# Patient Record
Sex: Male | Born: 1945 | Race: White | Hispanic: No | Smoking: Never smoker
Health system: Southern US, Community
[De-identification: ages and names within clinical notes are randomized; demographics above are authoritative.]

## PROBLEM LIST (undated history)

## (undated) DIAGNOSIS — I639 Cerebral infarction, unspecified: Secondary | ICD-10-CM

## (undated) DIAGNOSIS — I10 Essential (primary) hypertension: Secondary | ICD-10-CM

## (undated) DIAGNOSIS — I4819 Other persistent atrial fibrillation: Secondary | ICD-10-CM

## (undated) DIAGNOSIS — Q283 Other malformations of cerebral vessels: Secondary | ICD-10-CM

## (undated) DIAGNOSIS — M069 Rheumatoid arthritis, unspecified: Secondary | ICD-10-CM

## (undated) DIAGNOSIS — E785 Hyperlipidemia, unspecified: Secondary | ICD-10-CM

## (undated) DIAGNOSIS — M1990 Unspecified osteoarthritis, unspecified site: Secondary | ICD-10-CM

## (undated) HISTORY — PX: COLONOSCOPY: SHX174

## (undated) HISTORY — PX: TONSILLECTOMY: SUR1361

## (undated) HISTORY — DX: Essential (primary) hypertension: I10

## (undated) HISTORY — PX: PALATE / UVULA BIOPSY / EXCISION: SUR128

## (undated) HISTORY — DX: Hyperlipidemia, unspecified: E78.5

## (undated) HISTORY — PX: LIPOMA EXCISION: SHX5283

## (undated) HISTORY — DX: Other malformations of cerebral vessels: Q28.3

## (undated) HISTORY — DX: Rheumatoid arthritis, unspecified: M06.9

## (undated) HISTORY — DX: Unspecified osteoarthritis, unspecified site: M19.90

## (undated) HISTORY — DX: Other persistent atrial fibrillation: I48.19

## (undated) HISTORY — DX: Cerebral infarction, unspecified: I63.9

---

## 2003-09-12 ENCOUNTER — Ambulatory Visit (HOSPITAL_BASED_OUTPATIENT_CLINIC_OR_DEPARTMENT_OTHER): Admission: RE | Admit: 2003-09-12 | Discharge: 2003-09-12 | Payer: Self-pay | Admitting: Otolaryngology

## 2004-12-11 ENCOUNTER — Ambulatory Visit: Payer: Self-pay | Admitting: Family Medicine

## 2005-09-24 ENCOUNTER — Ambulatory Visit: Payer: Self-pay | Admitting: Family Medicine

## 2005-10-12 ENCOUNTER — Ambulatory Visit: Payer: Self-pay | Admitting: Family Medicine

## 2006-04-27 ENCOUNTER — Ambulatory Visit: Payer: Self-pay | Admitting: Family Medicine

## 2006-09-28 ENCOUNTER — Ambulatory Visit: Payer: Self-pay | Admitting: Family Medicine

## 2006-09-28 LAB — CONVERTED CEMR LAB
Albumin: 4.2 g/dL (ref 3.5–5.2)
BUN: 13 mg/dL (ref 6–23)
Basophils Absolute: 0 10*3/uL (ref 0.0–0.1)
Calcium: 9.9 mg/dL (ref 8.4–10.5)
Chol/HDL Ratio, serum: 3.6
Eosinophil percent: 5.5 % — ABNORMAL HIGH (ref 0.0–5.0)
GFR calc non Af Amer: 60 mL/min
HDL: 60.1 mg/dL (ref >39.0)
Lymphocytes Relative: 22.4 % (ref 12.0–46.0)
Monocytes Absolute: 0.6 10*3/uL (ref 0.2–0.7)
Neutro Abs: 3.5 10*3/uL (ref 1.4–7.7)
Platelets: 251 10*3/uL (ref 150–400)
Potassium: 3.4 meq/L — ABNORMAL LOW (ref 3.5–5.1)
RDW: 11.9 % (ref 11.5–14.6)
Sodium: 142 meq/L (ref 135–145)
TSH: 1.24 microintl units/mL (ref 0.35–5.50)
Triglyceride fasting, serum: 150 mg/dL — ABNORMAL HIGH (ref 0–149)

## 2006-10-19 ENCOUNTER — Ambulatory Visit: Payer: Self-pay | Admitting: Family Medicine

## 2007-07-05 ENCOUNTER — Encounter: Payer: Self-pay | Admitting: Family Medicine

## 2007-10-26 ENCOUNTER — Ambulatory Visit: Payer: Self-pay | Admitting: Family Medicine

## 2007-10-26 LAB — CONVERTED CEMR LAB
Alkaline Phosphatase: 46 units/L (ref 39–117)
BUN: 14 mg/dL (ref 6–23)
Basophils Absolute: 0.1 10*3/uL (ref 0.0–0.1)
Basophils Relative: 0.9 % (ref 0.0–1.0)
CO2: 31 meq/L (ref 19–32)
Calcium: 10.1 mg/dL (ref 8.4–10.5)
Cholesterol: 245 mg/dL (ref 0–200)
Creatinine, Ser: 1.2 mg/dL (ref 0.4–1.5)
Eosinophils Relative: 4.1 % (ref 0.0–5.0)
GFR calc Af Amer: 80 mL/min
HCT: 45 % (ref 39.0–52.0)
HDL: 66.6 mg/dL (ref >39.0)
Hemoglobin: 15.9 g/dL (ref 13.0–17.0)
MCHC: 35.3 g/dL (ref 30.0–36.0)
MCV: 92.7 fL (ref 78.0–100.0)
Monocytes Absolute: 0.6 10*3/uL (ref 0.2–0.7)
Neutro Abs: 3.3 10*3/uL (ref 1.4–7.7)
Platelets: 209 10*3/uL (ref 150–400)
Potassium: 4.1 meq/L (ref 3.5–5.1)
RBC: 4.85 M/uL (ref 4.22–5.81)
RDW: 11.7 % (ref 11.5–14.6)
Sodium: 140 meq/L (ref 135–145)
Triglycerides: 186 mg/dL — ABNORMAL HIGH (ref 0–149)
VLDL: 37 mg/dL (ref 0–40)
WBC: 5.6 10*3/uL (ref 4.5–10.5)

## 2007-11-01 ENCOUNTER — Ambulatory Visit: Payer: Self-pay | Admitting: Family Medicine

## 2007-11-01 DIAGNOSIS — I10 Essential (primary) hypertension: Secondary | ICD-10-CM | POA: Insufficient documentation

## 2007-11-01 DIAGNOSIS — E785 Hyperlipidemia, unspecified: Secondary | ICD-10-CM | POA: Insufficient documentation

## 2008-06-28 ENCOUNTER — Ambulatory Visit: Payer: Self-pay | Admitting: Internal Medicine

## 2008-07-20 ENCOUNTER — Ambulatory Visit: Payer: Self-pay | Admitting: Internal Medicine

## 2008-07-20 ENCOUNTER — Encounter: Payer: Self-pay | Admitting: Internal Medicine

## 2008-07-20 LAB — HM COLONOSCOPY

## 2008-07-24 ENCOUNTER — Encounter: Payer: Self-pay | Admitting: Internal Medicine

## 2008-10-24 ENCOUNTER — Ambulatory Visit: Payer: Self-pay | Admitting: Family Medicine

## 2008-10-24 LAB — CONVERTED CEMR LAB
AST: 52 units/L — ABNORMAL HIGH (ref 0–37)
Albumin: 4.2 g/dL (ref 3.5–5.2)
Alkaline Phosphatase: 37 units/L — ABNORMAL LOW (ref 39–117)
BUN: 21 mg/dL (ref 6–23)
Basophils Absolute: 0 10*3/uL (ref 0.0–0.1)
Basophils Relative: 0.7 % (ref 0.0–3.0)
Eosinophils Absolute: 0.2 10*3/uL (ref 0.0–0.7)
Eosinophils Relative: 4.2 % (ref 0.0–5.0)
GFR calc Af Amer: 88 mL/min
HCT: 43.5 % (ref 39.0–52.0)
Hemoglobin: 15.1 g/dL (ref 13.0–17.0)
LDL Cholesterol: 83 mg/dL (ref 0–99)
Monocytes Relative: 9.2 % (ref 3.0–12.0)
Neutro Abs: 3.3 10*3/uL (ref 1.4–7.7)
PSA: 2.73 ng/mL (ref 0.10–4.00)
Potassium: 3.4 meq/L — ABNORMAL LOW (ref 3.5–5.1)
Protein, U semiquant: NEGATIVE
RBC: 4.71 M/uL (ref 4.22–5.81)
RDW: 12 % (ref 11.5–14.6)
TSH: 0.96 microintl units/mL (ref 0.35–5.50)
Total Bilirubin: 1.4 mg/dL — ABNORMAL HIGH (ref 0.3–1.2)
Total CHOL/HDL Ratio: 2.6
Total Protein: 7.2 g/dL (ref 6.0–8.3)
Triglycerides: 198 mg/dL — ABNORMAL HIGH (ref 0–149)
WBC: 5.3 10*3/uL (ref 4.5–10.5)

## 2008-11-02 ENCOUNTER — Telehealth: Payer: Self-pay | Admitting: Family Medicine

## 2008-11-19 ENCOUNTER — Ambulatory Visit: Payer: Self-pay | Admitting: Family Medicine

## 2008-11-21 ENCOUNTER — Telehealth: Payer: Self-pay | Admitting: Family Medicine

## 2008-12-26 ENCOUNTER — Ambulatory Visit: Payer: Self-pay | Admitting: Family Medicine

## 2008-12-26 LAB — CONVERTED CEMR LAB
ALT: 56 units/L — ABNORMAL HIGH (ref 0–53)
AST: 30 units/L (ref 0–37)
Bilirubin, Direct: 0.2 mg/dL (ref 0.0–0.3)
Total Bilirubin: 1.5 mg/dL — ABNORMAL HIGH (ref 0.3–1.2)
Total Protein: 7.1 g/dL (ref 6.0–8.3)

## 2008-12-28 ENCOUNTER — Telehealth: Payer: Self-pay | Admitting: Family Medicine

## 2009-01-04 ENCOUNTER — Telehealth: Payer: Self-pay | Admitting: Family Medicine

## 2009-04-29 ENCOUNTER — Ambulatory Visit: Payer: Self-pay | Admitting: Family Medicine

## 2009-04-29 LAB — CONVERTED CEMR LAB
ALT: 120 units/L — ABNORMAL HIGH (ref 0–53)
AST: 70 units/L — ABNORMAL HIGH (ref 0–37)
Albumin: 4.2 g/dL (ref 3.5–5.2)
Alkaline Phosphatase: 40 units/L (ref 39–117)
BUN: 14 mg/dL (ref 6–23)
Basophils Relative: 0.8 % (ref 0.0–3.0)
CO2: 30 meq/L (ref 19–32)
Chloride: 108 meq/L (ref 96–112)
Creatinine, Ser: 1.1 mg/dL (ref 0.4–1.5)
Direct LDL: 120.1 mg/dL
GFR calc non Af Amer: 72.39 mL/min (ref >60)
Lymphocytes Relative: 22.1 % (ref 12.0–46.0)
Lymphs Abs: 1.2 10*3/uL (ref 0.7–4.0)
Neutro Abs: 3.4 10*3/uL (ref 1.4–7.7)
Neutrophils Relative %: 63.5 % (ref 43.0–77.0)
Platelets: 182 10*3/uL (ref 150.0–400.0)
Potassium: 3 meq/L — ABNORMAL LOW (ref 3.5–5.1)
RBC: 4.87 M/uL (ref 4.22–5.81)
RDW: 12.3 % (ref 11.5–14.6)
Total Bilirubin: 2 mg/dL — ABNORMAL HIGH (ref 0.3–1.2)
WBC Urine, dipstick: NEGATIVE
WBC: 5.3 10*3/uL (ref 4.5–10.5)

## 2009-05-06 ENCOUNTER — Ambulatory Visit: Payer: Self-pay | Admitting: Family Medicine

## 2009-12-23 ENCOUNTER — Ambulatory Visit: Payer: Self-pay | Admitting: Family Medicine

## 2009-12-23 LAB — CONVERTED CEMR LAB
Basophils Absolute: 0 10*3/uL (ref 0.0–0.1)
Bilirubin Urine: NEGATIVE
CO2: 28 meq/L (ref 19–32)
Calcium: 9.3 mg/dL (ref 8.4–10.5)
Cholesterol: 191 mg/dL (ref 0–200)
Creatinine, Ser: 1.2 mg/dL (ref 0.4–1.5)
Eosinophils Relative: 5.3 % — ABNORMAL HIGH (ref 0.0–5.0)
GFR calc non Af Amer: 65.33 mL/min (ref >60)
Glucose, Bld: 97 mg/dL (ref 70–99)
Glucose, Urine, Semiquant: NEGATIVE
HCT: 46.4 % (ref 39.0–52.0)
Hemoglobin: 15.1 g/dL (ref 13.0–17.0)
LDL Cholesterol: 118 mg/dL — ABNORMAL HIGH (ref 0–99)
Lymphocytes Relative: 26 % (ref 12.0–46.0)
Lymphs Abs: 1.2 10*3/uL (ref 0.7–4.0)
Monocytes Relative: 10.3 % (ref 3.0–12.0)
Neutrophils Relative %: 57.7 % (ref 43.0–77.0)
Potassium: 3.8 meq/L (ref 3.5–5.1)
RDW: 11.4 % — ABNORMAL LOW (ref 11.5–14.6)
Sodium: 139 meq/L (ref 135–145)
Total CHOL/HDL Ratio: 3
Total Protein: 7 g/dL (ref 6.0–8.3)
Triglycerides: 61 mg/dL (ref 0.0–149.0)
Urobilinogen, UA: 0.2
WBC Urine, dipstick: NEGATIVE
WBC: 4.6 10*3/uL (ref 4.5–10.5)

## 2010-01-01 ENCOUNTER — Ambulatory Visit: Payer: Self-pay | Admitting: Family Medicine

## 2010-06-13 ENCOUNTER — Ambulatory Visit: Payer: Self-pay | Admitting: Family Medicine

## 2010-06-13 ENCOUNTER — Ambulatory Visit: Payer: Self-pay | Admitting: Cardiology

## 2010-06-13 DIAGNOSIS — N029 Recurrent and persistent hematuria with unspecified morphologic changes: Secondary | ICD-10-CM | POA: Insufficient documentation

## 2010-06-13 LAB — CONVERTED CEMR LAB
Bilirubin Urine: NEGATIVE
Glucose, Urine, Semiquant: NEGATIVE
Nitrite: NEGATIVE
Specific Gravity, Urine: 1.02

## 2010-06-19 ENCOUNTER — Telehealth: Payer: Self-pay | Admitting: Family Medicine

## 2010-12-21 LAB — CONVERTED CEMR LAB
ALT: 109 units/L — ABNORMAL HIGH (ref 0–53)
Albumin: 4.4 g/dL (ref 3.5–5.2)
Alkaline Phosphatase: 47 units/L (ref 39–117)
Bilirubin, Direct: 0.2 mg/dL (ref 0.0–0.3)
Total Protein: 7.4 g/dL (ref 6.0–8.3)

## 2010-12-25 NOTE — Assessment & Plan Note (Signed)
Summary: personal - rv   Vital Signs:  Patient profile:   65 year old male Height:      66.75 inches Weight:      166 pounds BMI:     26.29 Temp:     98.6 degrees F oral BP sitting:   140 / 92  (left arm) Cuff size:   regular  Vitals Entered By: Kern Reap CMA Duncan Dull) (June 13, 2010 11:59 AM) CC: hematuria   CC:  hematuria.  History of Present Illness: Barry Woods is a 65 year old male, who comes in today for evaluation of hematuria.  He states last Friday he developed severe right flank pain and hematuria.  The pain lasted for about a day, and a half when away.  This morning he noticed  more blood in his urine.  No fever, chills, et Karie Soda.  No previous history of kidney stones.  Family history positive father had kidney stones    Allergies: 1)  ! Zyrtec Allergy 2)  ! Lipitor  Past History:  Past medical, surgical, family and social histories (including risk factors) reviewed, and no changes noted (except as noted below).  Past Medical History: Reviewed history from 11/01/2007 and no changes required. Hyperlipidemia Hypertension  Family History: Reviewed history from 11/01/2007 and no changes required. Family History of Colon CA 1st degree relative <60 Family History Hypertension  Social History: Reviewed history from 11/01/2007 and no changes required. Single Never Smoked Alcohol use-no Drug use-no Regular exercise-yes  Review of Systems      See HPI  Physical Exam  General:  Well-developed,well-nourished,in no acute distress; alert,appropriate and cooperative throughout examination   Impression & Recommendations:  Problem # 1:  HEMATURIA, HX OF (ICD-V13.09) Assessment New  Orders: UA Dipstick w/o Micro (manual) (62130) Radiology Referral (Radiology)  Complete Medication List: 1)  Zocor 20 Mg Tabs (Simvastatin) .Marland Kitchen.. 1 tab @ bedtime 2)  Tenormin 50 Mg Tabs (Atenolol) .... 1/2 qam 3)  Vicodin Es 7.5-750 Mg Tabs (Hydrocodone-acetaminophen) .Marland Kitchen.. 1  to 2 q 4 h. as needed severe pain  Patient Instructions: 1)  drink 30 ounces of water daily, I will also give you some pain pills to take Vicodin ES, one half to one tablet every 4 to 6 hours as needed for severe pain. 2)  Strain all urines. 3)  We will also be to set up for a scan of the kidneys today to determine if indeed he did have a kidney stone and where it is and what size it is...Marland KitchenMarland Kitchen Prescriptions: VICODIN ES 7.5-750 MG TABS (HYDROCODONE-ACETAMINOPHEN) 1 to 2 q 4 h. as needed severe pain  #30 x 0   Entered and Authorized by:   Roderick Pee MD   Signed by:   Roderick Pee MD on 06/13/2010   Method used:   Print then Give to Patient   RxID:   [id number]   Laboratory Results   Urine Tests  Date/Time Received: June 13, 2010   Routine Urinalysis   Color: yellow Appearance: Clear Glucose: negative   (Normal Range: Negative) Bilirubin: negative   (Normal Range: Negative) Ketone: negative   (Normal Range: Negative) Spec. Gravity: 1.020   (Normal Range: 1.003-1.035) Blood: moderate   (Normal Range: Negative) pH: 6.0   (Normal Range: 5.0-8.0) Protein: negative   (Normal Range: Negative) Urobilinogen: 0.2   (Normal Range: 0-1) Nitrite: negative   (Normal Range: Negative) Leukocyte Esterace: negative   (Normal Range: Negative)    Comments: Kern Reap CMA Duncan Dull)  June 13, 2010 12:29  PM

## 2010-12-25 NOTE — Assessment & Plan Note (Signed)
Summary: cpx/pt stated INS will pay one per calendar yr/njr   Vital Signs:  Patient profile:   65 year old male Height:      66.75 inches Weight:      173 pounds Temp:     98.9 degrees F oral BP sitting:   108 / 78  (left arm) Cuff size:   regular  Vitals Entered By: Kern Reap CMA Duncan Dull) (January 01, 2010 10:16 AM)  Reason for Visit cpx  History of Present Illness: Barry Woods is a 65 year old male, nonsmoker, who comes in today for evaluation of hyperlipidemia, and hypertension.  His hyperlipidemia.  She was Zocor 20 mg nightly lipids are at goal with a total of cholesterol 191, LDL 118, HDL 60.  He takes Tenormin, 50 mg one half tablet daily for mild hypertension.  BP 108/78.  he gets routine eye and  dental care.  Colonoscopy due to repeat since he had some polyps back in 2004.  Tetanus 2005.  He declines a seasonal flu .   Allergies: 1)  ! Zyrtec Allergy 2)  ! Lipitor  Past History:  Past medical, surgical, family and social histories (including risk factors) reviewed, and no changes noted (except as noted below).  Past Medical History: Reviewed history from 11/01/2007 and no changes required. Hyperlipidemia Hypertension  Family History: Reviewed history from 11/01/2007 and no changes required. Family History of Colon CA 1st degree relative <60 Family History Hypertension  Social History: Reviewed history from 11/01/2007 and no changes required. Single Never Smoked Alcohol use-no Drug use-no Regular exercise-yes  Review of Systems      See HPI  Physical Exam  General:  Well-developed,well-nourished,in no acute distress; alert,appropriate and cooperative throughout examination Head:  Normocephalic and atraumatic without obvious abnormalities. No apparent alopecia or balding. Eyes:  No corneal or conjunctival inflammation noted. EOMI. Perrla. Funduscopic exam benign, without hemorrhages, exudates or papilledema. Vision grossly normal. Ears:  External ear exam  shows no significant lesions or deformities.  Otoscopic examination reveals clear canals, tympanic membranes are intact bilaterally without bulging, retraction, inflammation or discharge. Hearing is grossly normal bilaterally. Nose:  External nasal examination shows no deformity or inflammation. Nasal mucosa are pink and moist without lesions or exudates. Mouth:  Oral mucosa and oropharynx without lesions or exudates.  Teeth in good repair. Neck:  No deformities, masses, or tenderness noted. Chest Wall:  No deformities, masses, tenderness or gynecomastia noted. Breasts:  No masses or gynecomastia noted Lungs:  Normal respiratory effort, chest expands symmetrically. Lungs are clear to auscultation, no crackles or wheezes. Heart:  Normal rate and regular rhythm. S1 and S2 normal without gallop, murmur, click, rub or other extra sounds. Abdomen:  Bowel sounds positive,abdomen soft and non-tender without masses, organomegaly or hernias noted. Rectal:  No external abnormalities noted. Normal sphincter tone. No rectal masses or tenderness. Genitalia:  Testes bilaterally descended without nodularity, tenderness or masses. No scrotal masses or lesions. No penis lesions or urethral discharge. Prostate:  Prostate gland firm and smooth, no enlargement, nodularity, tenderness, mass, asymmetry or induration. Msk:  No deformity or scoliosis noted of thoracic or lumbar spine.   Pulses:  R and L carotid,radial,femoral,dorsalis pedis and posterior tibial pulses are full and equal bilaterally Extremities:  No clubbing, cyanosis, edema, or deformity noted with normal full range of motion of all joints.   Neurologic:  No cranial nerve deficits noted. Station and gait are normal. Plantar reflexes are down-going bilaterally. DTRs are symmetrical throughout. Sensory, motor and coordinative functions appear intact.  Skin:  Intact without suspicious lesions or rashes Cervical Nodes:  No lymphadenopathy noted Axillary Nodes:   No palpable lymphadenopathy Inguinal Nodes:  No significant adenopathy Psych:  Cognition and judgment appear intact. Alert and cooperative with normal attention span and concentration. No apparent delusions, illusions, hallucinations   Impression & Recommendations:  Problem # 1:  HYPERTENSION (ICD-401.9) Assessment Improved  His updated medication list for this problem includes:    Tenormin 50 Mg Tabs (Atenolol) .Marland Kitchen... 1/2 qam  Orders: EKG w/ Interpretation (93000) Prescription Created Electronically [id number])  Problem # 2:  HYPERLIPIDEMIA (ICD-272.4) Assessment: Improved  His updated medication list for this problem includes:    Zocor 20 Mg Tabs (Simvastatin) .Marland Kitchen... 1 tab @ bedtime  Orders: EKG w/ Interpretation (93000) Prescription Created Electronically [id number])  Problem # 3:  HEALTH SCREENING (ICD-V70.0) Assessment: Unchanged  Orders: Prescription Created Electronically [id number])  Complete Medication List: 1)  Zocor 20 Mg Tabs (Simvastatin) .Marland Kitchen.. 1 tab @ bedtime 2)  Tenormin 50 Mg Tabs (Atenolol) .... 1/2 qam  Patient Instructions: 1)  continue your current medications and your good health habits 2)  Please schedule a follow-up appointment in 1 year. 3)  Take an Aspirin every day. Prescriptions: TENORMIN 50 MG TABS (ATENOLOL) 1/2 qam  #50 x 3   Entered and Authorized by:   Roderick Pee MD   Signed by:   Roderick Pee MD on 01/01/2010   Method used:   Electronically to        [facility] (retail)       [address]       [place], [place]  [address]       Ph: [phone]       Fax: [phone]   RxID:   [id number] ZOCOR 20 MG  TABS (SIMVASTATIN) 1 tab @ bedtime  #100 x 4   Entered and Authorized by:   Roderick Pee MD   Signed by:   Roderick Pee MD on 01/01/2010   Method used:   Electronically to        [facility].* (retail)       [address]       [place], [place]  [address]       Ph: [phone]       Fax: [phone]   RxID:    [id number]

## 2010-12-25 NOTE — Progress Notes (Signed)
Summary: [facility] 06/19/2010  Phone Note Call from Patient   Caller: Patient Call For: Roderick Pee MD Summary of Call: [facility] from previous call requesting to talk to a nurse.  [phone]   Initial call taken by: Lynann Beaver CMA,  June 19, 2010 9:05 AM  Follow-up for Phone Call        Pt is still having some blood in semen.  Wants to ask Dr. Tawanna Cooler if he should go on antibiotics.  Will wait for Dr. Tawanna Cooler to return to office.  Follow-up by: Lynann Beaver CMA,  June 19, 2010 9:14 AM  Additional Follow-up for Phone Call Additional follow up Details #1::        if continued bleeding, go back and see the urologist Additional Follow-up by: Roderick Pee MD,  June 23, 2010 8:10 AM    Additional Follow-up for Phone Call Additional follow up Details #2::    Notified. pt to see his Urologist per Dr. Tawanna Cooler. Follow-up by: Lynann Beaver CMA,  June 23, 2010 8:14 AM

## 2011-01-20 ENCOUNTER — Other Ambulatory Visit (INDEPENDENT_AMBULATORY_CARE_PROVIDER_SITE_OTHER): Payer: BC Managed Care – PPO | Admitting: Family Medicine

## 2011-01-20 DIAGNOSIS — Z0000 Encounter for general adult medical examination without abnormal findings: Secondary | ICD-10-CM

## 2011-01-20 LAB — POCT URINALYSIS DIPSTICK
Bilirubin, UA: NEGATIVE
Blood, UA: NEGATIVE
Glucose, UA: NEGATIVE
Spec Grav, UA: 1.025

## 2011-01-20 LAB — BASIC METABOLIC PANEL
BUN: 14 mg/dL (ref 6–23)
Calcium: 9.1 mg/dL (ref 8.4–10.5)
GFR: 69.78 mL/min (ref >60.00)
Potassium: 4.6 mEq/L (ref 3.5–5.1)

## 2011-01-20 LAB — LIPID PANEL
LDL Cholesterol: 103 mg/dL — ABNORMAL HIGH (ref 0–99)
Total CHOL/HDL Ratio: 4
VLDL: 12 mg/dL (ref 0.0–40.0)

## 2011-01-20 LAB — HEPATIC FUNCTION PANEL
ALT: 42 U/L (ref 0–53)
AST: 28 U/L (ref 0–37)
Alkaline Phosphatase: 44 U/L (ref 39–117)
Bilirubin, Direct: 0.2 mg/dL (ref 0.0–0.3)
Total Bilirubin: 1.5 mg/dL — ABNORMAL HIGH (ref 0.3–1.2)

## 2011-01-20 LAB — CBC WITH DIFFERENTIAL/PLATELET
Eosinophils Relative: 4.6 % (ref 0.0–5.0)
HCT: 43.7 % (ref 39.0–52.0)
Lymphocytes Relative: 26.5 % (ref 12.0–46.0)
Lymphs Abs: 1.2 10*3/uL (ref 0.7–4.0)
Monocytes Relative: 10.3 % (ref 3.0–12.0)
Neutrophils Relative %: 57.9 % (ref 43.0–77.0)
Platelets: 227 10*3/uL (ref 150.0–400.0)
WBC: 4.4 10*3/uL — ABNORMAL LOW (ref 4.5–10.5)

## 2011-02-02 ENCOUNTER — Encounter: Payer: Self-pay | Admitting: Family Medicine

## 2011-02-02 ENCOUNTER — Ambulatory Visit (INDEPENDENT_AMBULATORY_CARE_PROVIDER_SITE_OTHER): Payer: BC Managed Care – PPO | Admitting: Family Medicine

## 2011-02-02 DIAGNOSIS — E785 Hyperlipidemia, unspecified: Secondary | ICD-10-CM

## 2011-02-02 DIAGNOSIS — M109 Gout, unspecified: Secondary | ICD-10-CM

## 2011-02-02 DIAGNOSIS — I10 Essential (primary) hypertension: Secondary | ICD-10-CM

## 2011-02-02 MED ORDER — SIMVASTATIN 20 MG PO TABS: 20.0000 mg | ORAL_TABLET | Freq: Every day | ORAL | Status: DC

## 2011-02-02 MED ORDER — ATENOLOL 50 MG PO TABS: 50.0000 mg | ORAL_TABLET | Freq: Every day | ORAL | Status: DC

## 2011-02-02 NOTE — Patient Instructions (Signed)
Continue current medications.  I will call you I get the report on your blood tests for gout.  Return in one year for her annual exam

## 2011-02-02 NOTE — Progress Notes (Signed)
  Subjective:    Patient ID: Barry Woods, male    DOB: [birth year], 65 y.o.   MRN: [id number]  HPI Barry Woods is a 65 year old male, who comes in today for evaluation of chronic hyperlipidemia, on Zocor 20 mg daily, hypertension, on Tenormin 25 mg daily.  BP 110/80, and a new problem with gout and tenderness in his left nipple x 6 months.  BP is normal on 25 mg a day.  Lipids are normal on Zocor 20 mg a day.  If the soreness in his left nipple for 6 months.  He Has had  episodes of his foot.  , &  swelling soreness left great toe, right, and left wrists.  He went off alcohol for 40 days, but still has episodes of gout.  No family history of gout   Review of Systems  Constitutional: Negative.   HENT: Negative.   Eyes: Negative.   Respiratory: Negative.   Cardiovascular: Negative.   Gastrointestinal: Negative.   Genitourinary: Negative.   Musculoskeletal: Negative.   Skin: Negative.   Neurological: Negative.   Hematological: Negative.   Psychiatric/Behavioral: Negative.        Objective:   Physical Exam  Constitutional: He is oriented to person, place, and time. He appears well-developed and well-nourished.  HENT:  Head: Normocephalic and atraumatic.  Right Ear: External ear normal.  Left Ear: External ear normal.  Nose: Nose normal.  Mouth/Throat: Oropharynx is clear and moist.  Eyes: Conjunctivae and EOM are normal. Pupils are equal, round, and reactive to light.  Neck: Normal range of motion. Neck supple. No JVD present. No tracheal deviation present. No thyromegaly present.  Cardiovascular: Normal rate, regular rhythm, normal heart sounds and intact distal pulses.  Exam reveals no gallop and no friction rub.   No murmur heard. Pulmonary/Chest: Effort normal and breath sounds normal. No stridor. No respiratory distress. He has no wheezes. He has no rales. He exhibits no tenderness.  Abdominal: Soft. Bowel sounds are normal. He exhibits no distension and no mass. There is  no tenderness. There is no rebound and no guarding.  Genitourinary: Rectum normal, prostate normal and penis normal. Guaiac negative stool. No penile tenderness.  Musculoskeletal: Normal range of motion. He exhibits no edema and no tenderness.  Lymphadenopathy:    He has no cervical adenopathy.  Neurological: He is alert and oriented to person, place, and time. He has normal reflexes. No cranial nerve deficit. He exhibits normal muscle tone.  Skin: Skin is warm and dry. No rash noted. No erythema. No pallor.  Psychiatric: He has a normal mood and affect. His behavior is normal. Judgment and thought content normal.          Assessment & Plan:  Hypertension continue Tenormin 25 mg daily.  History of hyperlipidemia.  Continue Zocor and aspirin 20 mg daily.  Soreness left nipple in etiology unknown.  History of joint pain, possible gout check uric acid level

## 2011-02-03 ENCOUNTER — Telehealth: Payer: Self-pay | Admitting: Family Medicine

## 2011-02-03 DIAGNOSIS — I10 Essential (primary) hypertension: Secondary | ICD-10-CM

## 2011-02-03 MED ORDER — ATENOLOL 50 MG PO TABS: ORAL_TABLET | ORAL | Status: DC

## 2011-02-03 NOTE — Telephone Encounter (Signed)
Pharmacist needs directions on Atenolol 50 mg. Pls call asap.

## 2011-02-04 ENCOUNTER — Other Ambulatory Visit: Payer: Self-pay | Admitting: Family Medicine

## 2011-02-04 MED ORDER — ALLOPURINOL 100 MG PO TABS: 100.0000 mg | ORAL_TABLET | Freq: Every day | ORAL | Status: DC

## 2011-02-04 NOTE — Progress Notes (Signed)
Quick Note:  Spoke with pt - informed of lab results and the need to start med - to be called out to [facility] in [place]. KIK ______

## 2011-06-11 ENCOUNTER — Telehealth: Payer: Self-pay

## 2011-06-11 NOTE — Telephone Encounter (Signed)
Pt c/o gout in his shoulder that "hurts so bad I can hardly lift arm." He c/o hardly being able to get out of bed. Pt unable to come in today because he works in [place] and can't get here until late afternoon. Declines to see another physician. He notes that he has been taking ibuprofen with no relief and was given indocin 50 mg by a friend so he took that today. Would like to know if something can be called into the pharmacy. Please advise. Return call to work number.

## 2011-06-11 NOTE — Telephone Encounter (Signed)
Talked with a male and left a message that a rx can not be called in without being see.  Appointment offered.  Patient to call back.

## 2011-06-16 ENCOUNTER — Encounter: Payer: Self-pay | Admitting: Family Medicine

## 2011-06-16 ENCOUNTER — Ambulatory Visit (INDEPENDENT_AMBULATORY_CARE_PROVIDER_SITE_OTHER): Payer: BC Managed Care – PPO | Admitting: Family Medicine

## 2011-06-16 VITALS — BP 150/90 | Temp 98.6°F | Wt 168.0 lb

## 2011-06-16 DIAGNOSIS — M25519 Pain in unspecified shoulder: Secondary | ICD-10-CM

## 2011-06-16 DIAGNOSIS — M25511 Pain in right shoulder: Secondary | ICD-10-CM

## 2011-06-16 MED ORDER — HYDROCODONE-ACETAMINOPHEN 7.5-750 MG PO TABS: ORAL_TABLET | ORAL | Status: DC

## 2011-06-16 NOTE — Patient Instructions (Signed)
Motrin 600 mg twice daily with food, and Vicodin one half to one tablet 3 times a day as needed for severe pain.  Call [facility]  orthopedics to be seen this week

## 2011-06-16 NOTE — Progress Notes (Signed)
  Subjective:    Patient ID: Barry Woods, male    DOB: 15-Jun-1946, 65 y.o.   MRN: [id number]  HPI It is a 65 year old single male, nonsmoker, who comes in today for evaluation of bilateral shoulder pain.  He said this problem over the last couple weeks and is getting worse.  He tried over-the-counter Motrin over-the-counter Aleve.  Nothing seems to help.  He has had a history of bilateral shoulder surgery and agrees orthopedics years ago.  They thought initially of rotator cuff injury.  It turned out to be bursitis.   Review of Systems    General an orthopedic review of systems otherwise negative Objective:   Physical Exam Well-developed well-nourished, male in no acute distress.  Examination of the shoulder shows full range of motion of her.  She has extreme pain.       Assessment & Plan:  Bilateral shoulder pain.  Plan referred orthopedist, Motrin, Vicodin, p.r.n. Until pain and is addressed by orthopedics

## 2011-09-24 ENCOUNTER — Encounter: Payer: Self-pay | Admitting: Family Medicine

## 2011-09-24 ENCOUNTER — Ambulatory Visit (INDEPENDENT_AMBULATORY_CARE_PROVIDER_SITE_OTHER): Payer: BC Managed Care – PPO | Admitting: Family Medicine

## 2011-09-24 VITALS — BP 140/90 | Temp 98.4°F | Wt 169.0 lb

## 2011-09-24 DIAGNOSIS — M2540 Effusion, unspecified joint: Secondary | ICD-10-CM | POA: Insufficient documentation

## 2011-09-24 MED ORDER — PREDNISONE 20 MG PO TABS: ORAL_TABLET | ORAL | Status: DC

## 2011-09-24 NOTE — Progress Notes (Signed)
  Subjective:    Patient ID: Barry Woods, male    DOB: 1946/09/12, 65 y.o.   MRN: [id number]  HPI Ed is a 65 year old male Who comes in today for evaluation of symmetrical polyarthralgias.  He states for the past two, months he's noticed swelling and soreness of his hands, knees, feet, and left elbow.  He's been taking Motrin, 4 mg b.i.d. With no improvement.  He has seen Dr. Thomasena Edis and has had two injections in his shoulders for bursitis, which is helped the shoulder pain.  He also has been on allopurinol since July because of a history of gouty arthritis.  He is not having more episodes of gout in [place] taking allopurinol 100 mg a day.  However, he stopped it 3 days ago.  Review of systems otherwise negative.  Family history negative for gout or arthritis   Review of Systems    General and musculoskeletal review of systems otherwise negative Objective:   Physical Exam Well-developed well-nourished, male in no acute distress.  Examination hands says swelling of the proximal and distal joints.  Middle joints are spared.  There is also erythema.  Knees are warm, but not swollen.  Elbow is warm, but not swollen.      Assessment & Plan:  Polyarthralgias question RA.  History of gout

## 2011-09-24 NOTE — Patient Instructions (Signed)
Beginning the prednisone as directed.  Restart your allopurinol one daily.  Return next Monday for follow-up.

## 2011-09-25 LAB — CBC WITH DIFFERENTIAL/PLATELET
Basophils Relative: 3.2 % — ABNORMAL HIGH (ref 0.0–3.0)
HCT: 40.7 % (ref 39.0–52.0)
Hemoglobin: 14.1 g/dL (ref 13.0–17.0)
Lymphocytes Relative: 20.3 % (ref 12.0–46.0)
Lymphs Abs: 1.2 10*3/uL (ref 0.7–4.0)
Monocytes Relative: 11.9 % (ref 3.0–12.0)
Neutro Abs: 3.4 10*3/uL (ref 1.4–7.7)
RBC: 4.39 Mil/uL (ref 4.22–5.81)

## 2011-09-25 LAB — SEDIMENTATION RATE: Sed Rate: 16 mm/hr (ref 0–22)

## 2011-09-25 LAB — HEPATIC FUNCTION PANEL
Albumin: 3.9 g/dL (ref 3.5–5.2)
Bilirubin, Direct: 0.2 mg/dL (ref 0.0–0.3)
Total Protein: 7.1 g/dL (ref 6.0–8.3)

## 2011-09-25 LAB — URIC ACID: Uric Acid, Serum: 4 mg/dL (ref 4.0–7.8)

## 2011-09-25 LAB — ANTI-NUCLEAR AB-TITER (ANA TITER): ANA Titer 1: 1:80 {titer} — ABNORMAL HIGH

## 2011-09-25 LAB — RHEUMATOID FACTOR: Rhuematoid fact SerPl-aCnc: 220 IU/mL — ABNORMAL HIGH (ref <=14)

## 2011-09-28 ENCOUNTER — Ambulatory Visit (INDEPENDENT_AMBULATORY_CARE_PROVIDER_SITE_OTHER): Payer: BC Managed Care – PPO | Admitting: Family Medicine

## 2011-09-28 ENCOUNTER — Encounter: Payer: Self-pay | Admitting: Family Medicine

## 2011-09-28 VITALS — BP 150/90 | Temp 98.5°F | Wt 166.0 lb

## 2011-09-28 DIAGNOSIS — M069 Rheumatoid arthritis, unspecified: Secondary | ICD-10-CM

## 2011-09-28 MED ORDER — METHOTREXATE (ANTI-RHEUMATIC) 2.5 MG PO TABS: 7.5000 mg | ORAL_TABLET | ORAL | Status: DC

## 2011-09-28 NOTE — Patient Instructions (Signed)
Take the MTX as directed.  Taper the prednisone slowly as outlined.  Return in 4 weeks for follow-up.  Take Prilosec 20 mg daily, and a folate supplement

## 2011-09-28 NOTE — Progress Notes (Signed)
  Subjective:    Patient ID: Barry Woods, male    DOB: August 09, 1946, 65 y.o.   MRN: [id number]  HPI [name]  is a 65 year old single male, nonsmoker Who comes in today for evaluation of joint pain.  We saw him last week with severe joint pain.  His physical findings were consistent with rheumatoid arthritis, and, indeed, his tests confirm that.  His CCP was 300.  His symptoms have markedly date on the first to 40 mg of prednisone.  We talked about long-term treatment options and will taper the prednisone and start MTX   Review of Systems    General and a musculoskeletal review of systems otherwise negative.  No family history of RA Objective:   Physical Exam  Well-developed well-nourished man in acute distress.  Examination joint shows decrease in redness and swelling and pain      Assessment & Plan:  Premature arthritis.  Plan Taper prednisone begin methotrexate.  Follow-up labs

## 2011-09-29 ENCOUNTER — Other Ambulatory Visit: Payer: Self-pay | Admitting: Family Medicine

## 2011-09-29 ENCOUNTER — Telehealth: Payer: Self-pay | Admitting: Family Medicine

## 2011-09-29 NOTE — Telephone Encounter (Signed)
no

## 2011-09-29 NOTE — Telephone Encounter (Signed)
Pt is sch for 12/3 for 4 wk fup and is wondering if he needs to get labs done prior to ov?

## 2011-09-29 NOTE — Telephone Encounter (Signed)
Tried to call no answer at home number

## 2011-09-30 ENCOUNTER — Telehealth: Payer: Self-pay | Admitting: *Deleted

## 2011-09-30 NOTE — Telephone Encounter (Signed)
Is it okay for patient to drink O'Doules a non acholic beverage?

## 2011-09-30 NOTE — Telephone Encounter (Signed)
Pt is asking if he needs to be fasting for his next labs, and if he can have O'douls???

## 2011-10-01 NOTE — Telephone Encounter (Signed)
patient  Is aware 

## 2011-10-01 NOTE — Telephone Encounter (Signed)
ok 

## 2011-10-07 ENCOUNTER — Telehealth: Payer: Self-pay | Admitting: *Deleted

## 2011-10-07 NOTE — Telephone Encounter (Signed)
Spoke with patient and he will call back for a rheumatology referral

## 2011-10-07 NOTE — Telephone Encounter (Signed)
Pt states he called Monday and has not received a return call, requesting to speak with triage.

## 2011-10-09 ENCOUNTER — Telehealth: Payer: Self-pay | Admitting: Family Medicine

## 2011-10-09 DIAGNOSIS — M069 Rheumatoid arthritis, unspecified: Secondary | ICD-10-CM

## 2011-10-09 DIAGNOSIS — M2540 Effusion, unspecified joint: Secondary | ICD-10-CM

## 2011-10-09 NOTE — Telephone Encounter (Signed)
Pt requesting you call him about rheumatologist

## 2011-10-09 NOTE — Telephone Encounter (Signed)
Tried to call back but no answer

## 2011-10-12 NOTE — Telephone Encounter (Signed)
Ok ........Marland Kitchenhe does not need a referral he can call and make this appointment

## 2011-10-12 NOTE — Telephone Encounter (Signed)
Barry Woods would like to go see Dr Dareen Piano is this okay?

## 2011-10-22 ENCOUNTER — Telehealth: Payer: Self-pay | Admitting: *Deleted

## 2011-10-22 NOTE — Telephone Encounter (Signed)
Patient is requesting x-rays of his hands because of his RA.  Is this okay?

## 2011-10-23 NOTE — Telephone Encounter (Signed)
Spoke with patient and the provider who is taking care of his RA will have to order the x rays

## 2011-10-26 ENCOUNTER — Ambulatory Visit (INDEPENDENT_AMBULATORY_CARE_PROVIDER_SITE_OTHER): Payer: BC Managed Care – PPO | Admitting: Family Medicine

## 2011-10-26 ENCOUNTER — Encounter: Payer: Self-pay | Admitting: Family Medicine

## 2011-10-26 DIAGNOSIS — M069 Rheumatoid arthritis, unspecified: Secondary | ICD-10-CM

## 2011-10-26 DIAGNOSIS — M109 Gout, unspecified: Secondary | ICD-10-CM

## 2011-10-26 MED ORDER — ATENOLOL 50 MG PO TABS: 50.0000 mg | ORAL_TABLET | Freq: Every day | ORAL | Status: DC

## 2011-10-26 NOTE — Patient Instructions (Signed)
Stop the methotrexate.  Continue the Tenormin, 50 mg one half tab daily, Zocor, 20 mg nightly, and I would recommend he restart the allopurinol, one tablet daily.  I will send a copy of today's notes and  labwork to Dr. Dareen Piano

## 2011-10-26 NOTE — Progress Notes (Signed)
  Subjective:    Patient ID: Barry Woods, male    DOB: 12-10-1945, 64 y.o.   MRN: [id number]  HPI [name] is a 65 year old male, nonsmoker, who comes in today for follow-up of premature arthritis.  Five weeks ago.  We made that diagnosis and started him on prednisone and methotrexate.  We tapered off the prednisone over about a two week period of time and gave him 7.5 mg of methotrexate weekly.  He took his fourth dose last Tuesday and has had a lot of side effects and wishes to stop it.  He has an appointment to see Dr. Dareen Piano in 10 days.  He also stopped his allopurinol, although I recommended he continue to take that.   Review of Systems General and rheumatologic review of systems otherwise negative    Objective:   Physical Exam Well-developed well-nourished man in no acute distress.  Examination joint shows some tenderness.  No erythema.  No swelling       Assessment & Plan:  Rheumatoid arthritis.  Plan DC the MTX because of side effects.  Consult with Dr. Dareen Piano in 10 days.  Resume the allopurinol 100 mg daily

## 2011-10-27 LAB — CBC WITH DIFFERENTIAL/PLATELET
Basophils Absolute: 0 10*3/uL (ref 0.0–0.1)
Hemoglobin: 14.2 g/dL (ref 13.0–17.0)
Lymphocytes Relative: 23.1 % (ref 12.0–46.0)
Monocytes Relative: 9 % (ref 3.0–12.0)
Neutrophils Relative %: 62.9 % (ref 43.0–77.0)
Platelets: 241 10*3/uL (ref 150.0–400.0)
RDW: 13 % (ref 11.5–14.6)

## 2011-10-27 LAB — HEPATIC FUNCTION PANEL
ALT: 51 U/L (ref 0–53)
AST: 27 U/L (ref 0–37)
Albumin: 4.2 g/dL (ref 3.5–5.2)
Alkaline Phosphatase: 50 U/L (ref 39–117)
Total Protein: 7 g/dL (ref 6.0–8.3)

## 2011-10-27 LAB — BASIC METABOLIC PANEL
CO2: 30 mEq/L (ref 19–32)
Chloride: 106 mEq/L (ref 96–112)
Sodium: 142 mEq/L (ref 135–145)

## 2011-10-29 ENCOUNTER — Telehealth: Payer: Self-pay | Admitting: *Deleted

## 2011-10-29 NOTE — Telephone Encounter (Signed)
Fax not able to be transmitted to Dr Dareen Piano as documented below. Please call to confirm fax number again. Thanks.

## 2011-10-29 NOTE — Telephone Encounter (Signed)
Received call from pt requesting copy of labs be faxed to him at [phone]. Pt notified of results and copy faxed. Copy also faxed to Dr Dareen Piano [phone]).

## 2011-10-30 NOTE — Telephone Encounter (Signed)
faxed

## 2011-11-11 ENCOUNTER — Telehealth: Payer: Self-pay | Admitting: *Deleted

## 2011-11-11 MED ORDER — ZOSTER VACCINE LIVE 19400 UNT/0.65ML ~~LOC~~ SOLR: 0.6500 mL | Freq: Once | SUBCUTANEOUS | Status: AC

## 2011-11-11 NOTE — Telephone Encounter (Signed)
requesting written script for .zostavax--did not l eave name of pharmacy-- I have call into pt to call us back with name of pharmacy

## 2011-11-11 NOTE — Telephone Encounter (Signed)
rx faxed

## 2011-12-03 ENCOUNTER — Ambulatory Visit (INDEPENDENT_AMBULATORY_CARE_PROVIDER_SITE_OTHER): Payer: BC Managed Care – PPO | Admitting: Family Medicine

## 2011-12-03 DIAGNOSIS — Z2911 Encounter for prophylactic immunotherapy for respiratory syncytial virus (RSV): Secondary | ICD-10-CM

## 2011-12-03 DIAGNOSIS — Z0000 Encounter for general adult medical examination without abnormal findings: Secondary | ICD-10-CM

## 2012-04-08 ENCOUNTER — Other Ambulatory Visit (INDEPENDENT_AMBULATORY_CARE_PROVIDER_SITE_OTHER): Payer: BC Managed Care – PPO

## 2012-04-08 DIAGNOSIS — Z0000 Encounter for general adult medical examination without abnormal findings: Secondary | ICD-10-CM

## 2012-04-08 LAB — CBC WITH DIFFERENTIAL/PLATELET
Basophils Relative: 1.4 % (ref 0.0–3.0)
Eosinophils Absolute: 0.1 10*3/uL (ref 0.0–0.7)
MCHC: 33.5 g/dL (ref 30.0–36.0)
MCV: 93.4 fl (ref 78.0–100.0)
Monocytes Absolute: 0.7 10*3/uL (ref 0.1–1.0)
Neutrophils Relative %: 61.1 % (ref 43.0–77.0)
Platelets: 175 10*3/uL (ref 150.0–400.0)
RDW: 14.6 % (ref 11.5–14.6)

## 2012-04-08 LAB — BASIC METABOLIC PANEL
BUN: 10 mg/dL (ref 6–23)
CO2: 25 mEq/L (ref 19–32)
Chloride: 105 mEq/L (ref 96–112)
Creatinine, Ser: 1 mg/dL (ref 0.4–1.5)
Glucose, Bld: 77 mg/dL (ref 70–99)

## 2012-04-08 LAB — HEPATIC FUNCTION PANEL
Bilirubin, Direct: 0.3 mg/dL (ref 0.0–0.3)
Total Bilirubin: 1.7 mg/dL — ABNORMAL HIGH (ref 0.3–1.2)
Total Protein: 6.7 g/dL (ref 6.0–8.3)

## 2012-04-08 LAB — LIPID PANEL
Cholesterol: 179 mg/dL (ref 0–200)
LDL Cholesterol: 83 mg/dL (ref 0–99)
Triglycerides: 45 mg/dL (ref 0.0–149.0)

## 2012-04-08 LAB — POCT URINALYSIS DIPSTICK
Blood, UA: NEGATIVE
Glucose, UA: NEGATIVE
Spec Grav, UA: 1.02
Urobilinogen, UA: 0.2

## 2012-04-19 ENCOUNTER — Encounter: Payer: Self-pay | Admitting: Family Medicine

## 2012-04-19 ENCOUNTER — Ambulatory Visit (INDEPENDENT_AMBULATORY_CARE_PROVIDER_SITE_OTHER): Payer: BC Managed Care – PPO | Admitting: Family Medicine

## 2012-04-19 VITALS — BP 180/110 | Temp 98.7°F | Ht 67.5 in | Wt 165.0 lb

## 2012-04-19 DIAGNOSIS — M069 Rheumatoid arthritis, unspecified: Secondary | ICD-10-CM

## 2012-04-19 DIAGNOSIS — E785 Hyperlipidemia, unspecified: Secondary | ICD-10-CM

## 2012-04-19 DIAGNOSIS — I10 Essential (primary) hypertension: Secondary | ICD-10-CM

## 2012-04-19 DIAGNOSIS — Z0000 Encounter for general adult medical examination without abnormal findings: Secondary | ICD-10-CM

## 2012-04-19 MED ORDER — SIMVASTATIN 20 MG PO TABS: 20.0000 mg | ORAL_TABLET | Freq: Every day | ORAL | Status: DC

## 2012-04-19 MED ORDER — ATENOLOL 50 MG PO TABS: 50.0000 mg | ORAL_TABLET | Freq: Every day | ORAL | Status: DC

## 2012-04-19 NOTE — Patient Instructions (Signed)
Take an extra 50 mg of Tenormin when you get home now  No alcohol  BP check daily in the morning  Reset return sometime in the next week or 2 for removal of the red lesion on your right clavicle

## 2012-04-19 NOTE — Progress Notes (Signed)
  Subjective:    Patient ID: Barry Woods, male    DOB: [birth year], 66 y.o.   MRN: [id number]  HPI  [name] is a 66 year old male nonsmoker who comes in today for evaluation of hyperlipidemia and hypertension  He takes Tenormin 50 mg daily blood pressure typically averages 120/80 today it's 180/110 because he was drinking a lot of alcohol over the weekend  He takes Zocor 20 mg daily  He's had a history of abnormal liver functions most likely said related to excessive episodic alcohol consumption  He's currently on Enbrel 50 mg IM weekly for arthritis by his rheumatologist  Review of Systems  Constitutional: Negative.   HENT: Negative.   Eyes: Negative.   Respiratory: Negative.   Cardiovascular: Negative.   Gastrointestinal: Negative.   Genitourinary: Negative.   Musculoskeletal: Negative.   Skin: Negative.   Neurological: Negative.   Hematological: Negative.   Psychiatric/Behavioral: Negative.        Objective:   Physical Exam  Constitutional: He is oriented to person, place, and time. He appears well-developed and well-nourished.  HENT:  Head: Normocephalic and atraumatic.  Right Ear: External ear normal.  Left Ear: External ear normal.  Nose: Nose normal.  Mouth/Throat: Oropharynx is clear and moist.  Eyes: Conjunctivae and EOM are normal. Pupils are equal, round, and reactive to light.  Neck: Normal range of motion. Neck supple. No JVD present. No tracheal deviation present. No thyromegaly present.  Cardiovascular: Normal rate, regular rhythm, normal heart sounds and intact distal pulses.  Exam reveals no gallop and no friction rub.   No murmur heard. Pulmonary/Chest: Effort normal and breath sounds normal. No stridor. No respiratory distress. He has no wheezes. He has no rales. He exhibits no tenderness.  Abdominal: Soft. Bowel sounds are normal. He exhibits no distension and no mass. There is no tenderness. There is no rebound and no guarding.  Genitourinary:  Rectum normal, prostate normal and penis normal. Guaiac negative stool. No penile tenderness.  Musculoskeletal: Normal range of motion. He exhibits no edema and no tenderness.  Lymphadenopathy:    He has no cervical adenopathy.  Neurological: He is alert and oriented to person, place, and time. He has normal reflexes. No cranial nerve deficit. He exhibits normal muscle tone.  Skin: Skin is warm and dry. No rash noted. No erythema. No pallor.  Psychiatric: He has a normal mood and affect. His behavior is normal. Judgment and thought content normal.          Assessment & Plan:  Healthy male  Hypertension take another 50 mg of Tenormin now then 50 mg every morning BP check daily to be sure blood pressure goes back to normal  Hyperlipidemia continue Zocor  Arthritis continue Enbrel via rheumatologist

## 2012-05-05 ENCOUNTER — Encounter: Payer: Self-pay | Admitting: Family Medicine

## 2012-05-05 ENCOUNTER — Ambulatory Visit (INDEPENDENT_AMBULATORY_CARE_PROVIDER_SITE_OTHER): Payer: BC Managed Care – PPO | Admitting: Family Medicine

## 2012-05-05 DIAGNOSIS — D229 Melanocytic nevi, unspecified: Secondary | ICD-10-CM

## 2012-05-05 DIAGNOSIS — D235 Other benign neoplasm of skin of trunk: Secondary | ICD-10-CM

## 2012-05-05 NOTE — Progress Notes (Signed)
  Subjective:    Patient ID: Barry Woods, male    DOB: 19-May-1946, 66 y.o.   MRN: [id number]  HPI  Barry Woods is a 66 year old male who comes in today for removal of a lesion on his right clavicle.  The lesion is about 8 mm x 8 mm with red raised abnormal looking mole  After informed consent the lesion was anesthetized with 1% Xylocaine with epinephrine and removed with 2 mm margins. The bases cauterized dry sterile dressing was applied he tolerated the procedure no complications. The lesion was sent for pathologic analysis clinically appears to be a dysplastic nevus rule out skin cancer  Review of Systems General and dermatologic review of systems otherwise negative well-developed well-nourished    Objective:   Physical Exam  Well-developed well-nourished male in no acute distress procedure see above problem probable dysplastic nevus Path pending      Assessment & Plan:  Probable dysplastic nevus Path pending

## 2012-12-29 ENCOUNTER — Telehealth: Payer: Self-pay | Admitting: Family Medicine

## 2012-12-29 NOTE — Telephone Encounter (Signed)
Patient Information:  Caller Name: Kweku  Phone: [phone]  Patient: Barry Woods, Barry Woods  Gender: Male  DOB: [birth year]  Age: 67 Years  PCP: Kelle Darting [facility])  Office Follow Up:  Does the office need to follow up with this patient?: Yes  Instructions For The Office: Patient is going to be in the office in three weeks and prefers not to come in today or tomorrow as per triage findings.  Is there a medication that can be called in ? Patient pharmacy- [facility] [address] [place].  CONTACT PATIENT.  [phone].  PLEASE NOTE PATIENTS NEW PHONE NUMBER.  Home care instructions and call back parameters reviewed.  RN Note:  Patient is going to be in the office in three weeks and prefers not to come in today or tomorrow as per triage findings.  Is there a medication that can be called in ? Patient pharmacy- [facility] [address] [place].  CONTACT PATIENT.  [phone].  PLEASE NOTE PATIENTS NEW PHONE NUMBER.  Home care instructions and call back parameters reviewed.  Symptoms  Reason For Call & Symptoms: Patient states he called office yesterday concerning Jock Itch. ( He was expecting a call back but believes the office does not have correct number.  ).  Onset two weeks ago. Tx with Lotrimin and Antibiotic ointment.  Location+ groin /crotch area , cheeks of buttocks and on bottom of scrotum. +itching, sweating in the area at night  Reviewed Health History In EMR: Yes  Reviewed Medications In EMR: Yes  Reviewed Allergies In EMR: Yes  Reviewed Surgeries / Procedures: No  Date of Onset of Symptoms: 12/15/2012  Treatments Tried: Lotrimin ointment , Lotrimin aerosol,  Treatments Tried Worked: No  Guideline(s) Used:  Jock Itch  Disposition Per Guideline:   See Today or Tomorrow in Office  Reason For Disposition Reached:   After week on treatment and rash has not improved  Advice Given:  Antifungal Cream for Treatment of Jock Itch.  Available  over-the-counter in [place] as clotrimazole (Lotrimin AF) or miconazole (Micatin, Monistat-Derm).  Expected Course:  The rash should clear up completely in 2-3 weeks.  Call Back If:   Rash is not improving after 1 week on treatment  Rash is not completely cleared by 3 weeks  Fever or pain occurs  You become worse.  Genital Hygiene:  Keep your penis and scrotal area clean. Wash this area once daily with un-scented soap and water.  After washing, dry the groin area before the feet (Reason: to prevent spread of tinea pedis to groin area).  Keep your penis and scrotal area dry.  Wear cotton underwear (Reason: breathes and keeps area drier). Avoid nylon or tight-fitting underwear.  Patient Refused Recommendation:  Patient Requests Prescription  Patient is going to be in the office in three weeks and prefers not to come in today or tomorrow as per triage findings.  Is there a medication that can be called in ? Patient pharmacy- [facility] [address] [place].  CONTACT PATIENT.  [phone].  PLEASE NOTE PATIENTS NEW PHONE NUMBER.  Home care instructions and call back parameters reviewed.

## 2012-12-30 MED ORDER — FLUCONAZOLE 150 MG PO TABS: 150.0000 mg | ORAL_TABLET | ORAL | Status: DC

## 2012-12-30 NOTE — Telephone Encounter (Signed)
Call in fluconazole 150 mg  Take once daily weekly x 4 week.  #4. No RF

## 2013-01-18 ENCOUNTER — Other Ambulatory Visit (INDEPENDENT_AMBULATORY_CARE_PROVIDER_SITE_OTHER): Payer: BC Managed Care – PPO

## 2013-01-18 DIAGNOSIS — Z0000 Encounter for general adult medical examination without abnormal findings: Secondary | ICD-10-CM

## 2013-01-18 LAB — POCT URINALYSIS DIPSTICK
Ketones, UA: NEGATIVE
Leukocytes, UA: NEGATIVE
Nitrite, UA: NEGATIVE
Protein, UA: NEGATIVE
Urobilinogen, UA: 0.2

## 2013-01-18 LAB — BASIC METABOLIC PANEL
BUN: 15 mg/dL (ref 6–23)
CO2: 26 mEq/L (ref 19–32)
Calcium: 8.8 mg/dL (ref 8.4–10.5)
Creatinine, Ser: 1 mg/dL (ref 0.4–1.5)
Glucose, Bld: 90 mg/dL (ref 70–99)
Sodium: 134 mEq/L — ABNORMAL LOW (ref 135–145)

## 2013-01-18 LAB — CBC WITH DIFFERENTIAL/PLATELET
Basophils Absolute: 0 10*3/uL (ref 0.0–0.1)
Eosinophils Absolute: 0.2 10*3/uL (ref 0.0–0.7)
Hemoglobin: 15.2 g/dL (ref 13.0–17.0)
Lymphocytes Relative: 28.6 % (ref 12.0–46.0)
MCHC: 34.3 g/dL (ref 30.0–36.0)
Neutro Abs: 2.4 10*3/uL (ref 1.4–7.7)
RDW: 12.2 % (ref 11.5–14.6)

## 2013-01-18 LAB — TSH: TSH: 1.75 u[IU]/mL (ref 0.35–5.50)

## 2013-01-18 LAB — HEPATIC FUNCTION PANEL
Albumin: 4 g/dL (ref 3.5–5.2)
Alkaline Phosphatase: 44 U/L (ref 39–117)

## 2013-01-18 LAB — LIPID PANEL: Cholesterol: 216 mg/dL — ABNORMAL HIGH (ref 0–200)

## 2013-01-19 ENCOUNTER — Encounter: Payer: Self-pay | Admitting: *Deleted

## 2013-01-24 ENCOUNTER — Telehealth: Payer: Self-pay | Admitting: Family Medicine

## 2013-01-24 DIAGNOSIS — R945 Abnormal results of liver function studies: Secondary | ICD-10-CM

## 2013-01-24 NOTE — Telephone Encounter (Signed)
Patient called stating that he needs to reschedule his cpx but would like to speak with the nurse first. Please assist.

## 2013-01-24 NOTE — Telephone Encounter (Signed)
Spoke with patient and lab ordered

## 2013-01-31 ENCOUNTER — Encounter: Payer: BC Managed Care – PPO | Admitting: Family Medicine

## 2013-03-14 ENCOUNTER — Ambulatory Visit (INDEPENDENT_AMBULATORY_CARE_PROVIDER_SITE_OTHER): Payer: BC Managed Care – PPO | Admitting: Family Medicine

## 2013-03-14 ENCOUNTER — Encounter: Payer: Self-pay | Admitting: Family Medicine

## 2013-03-14 VITALS — BP 102/72 | HR 76 | Temp 98.2°F | Wt 169.0 lb

## 2013-03-14 DIAGNOSIS — B369 Superficial mycosis, unspecified: Secondary | ICD-10-CM | POA: Insufficient documentation

## 2013-03-14 DIAGNOSIS — Z8639 Personal history of other endocrine, nutritional and metabolic disease: Secondary | ICD-10-CM

## 2013-03-14 DIAGNOSIS — Z862 Personal history of diseases of the blood and blood-forming organs and certain disorders involving the immune mechanism: Secondary | ICD-10-CM

## 2013-03-14 DIAGNOSIS — L723 Sebaceous cyst: Secondary | ICD-10-CM

## 2013-03-14 NOTE — Progress Notes (Signed)
  Subjective:    Patient ID: Barry Woods, male    DOB: [birth year], 67 y.o.   MRN: [id number]  HPI Head is a 67 year old male who comes in today for evaluation of 2 problems  He says since last January said jock itch and his use the medication OTC with no affect. We also gave him Diflucan 1 tablet daily for 7 days that didn't seem to help either.  He states he's decreased his alcohol consumption to 2 beers daily he still for followup of his LFTs  He also has a bump on his back that he would like removed   Review of Systems    review of systems negative Objective:   Physical Exam Thin male no acute distress has a noticeable tremor.  Groin exam shows some very mild fungal infection  Lesion on the back is a quarter-sized sebaceous cyst       Assessment & Plan:  History of abnormal LFTs recheck labs  Fungal infection OTC antifungal cream mixed with steroid cream twice daily  Return for removal of the sebaceous cyst

## 2013-03-14 NOTE — Patient Instructions (Signed)
Use small amounts of the antifungal cream and the hydrocortisone cream,,,,,,,, both OTC,,,,,,,,,,,, twice daily  Labs today  Return sometime in the next month for removal of the lesion on your back

## 2013-04-04 ENCOUNTER — Encounter: Payer: Self-pay | Admitting: Family Medicine

## 2013-04-04 ENCOUNTER — Ambulatory Visit (INDEPENDENT_AMBULATORY_CARE_PROVIDER_SITE_OTHER): Payer: BC Managed Care – PPO | Admitting: Family Medicine

## 2013-04-04 DIAGNOSIS — L723 Sebaceous cyst: Secondary | ICD-10-CM

## 2013-04-04 NOTE — Progress Notes (Signed)
  Subjective:    Patient ID: Barry Woods, male    DOB: 09-17-1946, 67 y.o.   MRN: [id number]  HPIEdward is a 67 year old male who comes in today for removal of a lesion on his back  The lesion measures 15 mm x 15 mm it has a central pore consistent with an enlarging sebaceous cyst  After informed consent the lesion was anesthetized with 1% Xylocaine with epinephrine. A half inch incision was made. The cyst was removed in toto. Pressure was applied glue is applied to the wound. He tolerated the procedure no complications    Review of Systems    review of systems negative Objective:   Physical Exam Procedure see above       Assessment & Plan:  Clinically this is a sebaceous cyst

## 2013-04-20 ENCOUNTER — Other Ambulatory Visit (INDEPENDENT_AMBULATORY_CARE_PROVIDER_SITE_OTHER): Payer: BC Managed Care – PPO

## 2013-04-20 DIAGNOSIS — K7200 Acute and subacute hepatic failure without coma: Secondary | ICD-10-CM

## 2013-04-20 LAB — HEPATIC FUNCTION PANEL
ALT: 44 U/L (ref 0–53)
AST: 39 U/L — ABNORMAL HIGH (ref 0–37)
Alkaline Phosphatase: 44 U/L (ref 39–117)
Bilirubin, Direct: 0.3 mg/dL (ref 0.0–0.3)
Total Bilirubin: 2.3 mg/dL — ABNORMAL HIGH (ref 0.3–1.2)

## 2013-04-24 ENCOUNTER — Ambulatory Visit (INDEPENDENT_AMBULATORY_CARE_PROVIDER_SITE_OTHER): Payer: BC Managed Care – PPO | Admitting: Family Medicine

## 2013-04-24 ENCOUNTER — Encounter: Payer: Self-pay | Admitting: Family Medicine

## 2013-04-24 VITALS — BP 190/100 | Temp 99.1°F | Ht 67.25 in | Wt 169.0 lb

## 2013-04-24 DIAGNOSIS — M069 Rheumatoid arthritis, unspecified: Secondary | ICD-10-CM

## 2013-04-24 DIAGNOSIS — Z87448 Personal history of other diseases of urinary system: Secondary | ICD-10-CM

## 2013-04-24 DIAGNOSIS — I10 Essential (primary) hypertension: Secondary | ICD-10-CM

## 2013-04-24 DIAGNOSIS — E785 Hyperlipidemia, unspecified: Secondary | ICD-10-CM

## 2013-04-24 DIAGNOSIS — Z862 Personal history of diseases of the blood and blood-forming organs and certain disorders involving the immune mechanism: Secondary | ICD-10-CM

## 2013-04-24 DIAGNOSIS — Z8639 Personal history of other endocrine, nutritional and metabolic disease: Secondary | ICD-10-CM

## 2013-04-24 NOTE — Progress Notes (Signed)
  Subjective:    Patient ID: Barry Woods, male    DOB: 1945/12/22, 67 y.o.   MRN: [id number]  HPI Barry Woods  is a 67 year old male nonsmoker who comes in today for general physical examination because of a history of hypertension and rheumatoid arthritis and hyperlipidemia  His blood pressure today is 190/100 normal he on Tenormin 50 mg daily his blood pressures normal. He states that yesterday he had a party and drank a lot of vodka. He had gone off his vodka when I talked to him a year ago about his abnormal liver functions. He then switched to one glass of beer or glass of red wine in the evening and his liver functions drop back to normal then it went back up and in 3 months ago we checked and they were up again. He then backed off on the vodka and his LFTs dropped almost to normal.  He gets routine eye care, dental care, colonoscopy and GI, vaccinations up-to-date last tetanus 2005 booster today   Review of Systems  Constitutional: Negative.   HENT: Negative.   Eyes: Negative.   Respiratory: Negative.   Cardiovascular: Negative.   Gastrointestinal: Negative.   Genitourinary: Negative.   Musculoskeletal: Negative.   Skin: Negative.   Neurological: Negative.   Psychiatric/Behavioral: Negative.        Objective:   Physical Exam  Constitutional: He is oriented to person, place, and time. He appears well-developed and well-nourished.  shaky  HENT:  Head: Normocephalic and atraumatic.  Right Ear: External ear normal.  Left Ear: External ear normal.  Nose: Nose normal.  Mouth/Throat: Oropharynx is clear and moist.  Eyes: Conjunctivae and EOM are normal. Pupils are equal, round, and reactive to light.  Neck: Normal range of motion. Neck supple. No JVD present. No tracheal deviation present. No thyromegaly present.  Cardiovascular: Normal rate, regular rhythm, normal heart sounds and intact distal pulses.  Exam reveals no gallop and no friction rub.   No murmur heard. BP when he  came in 190/100 by Fleet Contras repeat by me same  Pulmonary/Chest: Effort normal and breath sounds normal. No stridor. No respiratory distress. He has no wheezes. He has no rales. He exhibits no tenderness.  Abdominal: Soft. Bowel sounds are normal. He exhibits no distension and no mass. There is no tenderness. There is no rebound and no guarding.  Genitourinary: Rectum normal, prostate normal and penis normal. Guaiac negative stool. No penile tenderness.  Musculoskeletal: Normal range of motion. He exhibits no edema and no tenderness.  Lymphadenopathy:    He has no cervical adenopathy.  Neurological: He is alert and oriented to person, place, and time. He has normal reflexes. No cranial nerve deficit. He exhibits normal muscle tone.  Skin: Skin is warm and dry. No rash noted. No erythema. No pallor.  Psychiatric: He has a normal mood and affect. His behavior is normal. Judgment and thought content normal.   He was given 0.2 mg of clonidine at 4:10 PM BP was checked q. 15 minutes       Assessment & Plan:  Healthy male  Hypertension not at goal secondary to alcohol abuse......Marland Kitchen Again discussed no alcohol, Tenormin 50 mg now then Tenormin 50 mg every morning BP check daily followup in one month  Rheumatoid arthritis continue followup in rheumatology  Hyperlipidemia continue Zocor 20 mg daily  Abnormal liver function studies again discontinue all alcohol followup labs in 4 weeks when he comes in for followup

## 2013-04-24 NOTE — Patient Instructions (Signed)
Go directly home and take another 50 mg of Tenormin now  Bedrest tonight at home  No alcohol  Then starting tomorrow take your 50 mg dose once a day in the morning until blood pressure checked daily in the morning  Return in 4 weeks for followup sooner if any problems

## 2013-04-25 ENCOUNTER — Encounter: Payer: Self-pay | Admitting: Family Medicine

## 2013-05-03 ENCOUNTER — Other Ambulatory Visit: Payer: Self-pay | Admitting: Family Medicine

## 2013-05-16 ENCOUNTER — Other Ambulatory Visit: Payer: Self-pay | Admitting: Family Medicine

## 2013-05-18 ENCOUNTER — Ambulatory Visit (INDEPENDENT_AMBULATORY_CARE_PROVIDER_SITE_OTHER): Payer: BC Managed Care – PPO | Admitting: Family Medicine

## 2013-05-18 ENCOUNTER — Encounter: Payer: Self-pay | Admitting: Family Medicine

## 2013-05-18 VITALS — BP 130/80 | Temp 98.8°F | Wt 168.0 lb

## 2013-05-18 DIAGNOSIS — Z8639 Personal history of other endocrine, nutritional and metabolic disease: Secondary | ICD-10-CM

## 2013-05-18 DIAGNOSIS — I10 Essential (primary) hypertension: Secondary | ICD-10-CM

## 2013-05-18 DIAGNOSIS — Z862 Personal history of diseases of the blood and blood-forming organs and certain disorders involving the immune mechanism: Secondary | ICD-10-CM

## 2013-05-18 LAB — HEPATIC FUNCTION PANEL
ALT: 47 U/L (ref 0–53)
Albumin: 4.2 g/dL (ref 3.5–5.2)
Bilirubin, Direct: 0.1 mg/dL (ref 0.0–0.3)
Total Protein: 7.4 g/dL (ref 6.0–8.3)

## 2013-05-18 NOTE — Progress Notes (Signed)
  Subjective:    Patient ID: AKHILESH SASSONE, male    DOB: 01/21/1946, 67 y.o.   MRN: [id number]  HPI Tell is a 67 year old male who comes in today for evaluation of hypertension  His blood pressure on Tenormin 50 mg daily is 130/80  No side effects to medication  4 weeks ago we repeated his LFTs because they had gone up. He's decreased his alcohol consumption significantly. Followup lab work today   Review of Systems Review of systems negative    Objective:   Physical Exam Well-developed well-nourished male in no acute distress BP right arm sitting position is 130/80 at 3 PM in the afternoon       Assessment & Plan:  Hypertension at goal continue current therapy BP check weekly  Abnormal LFTs continue to minimize your alcohol consumption and check labs

## 2013-05-18 NOTE — Patient Instructions (Addendum)
Tenormin 50 mg........ One tablet daily in the morning  We will call you tomorrow or Monday will he gets her lab work back

## 2013-09-28 ENCOUNTER — Other Ambulatory Visit: Payer: Self-pay

## 2014-05-11 ENCOUNTER — Other Ambulatory Visit: Payer: BC Managed Care – PPO

## 2014-05-15 ENCOUNTER — Encounter: Payer: BC Managed Care – PPO | Admitting: Family Medicine

## 2014-05-22 ENCOUNTER — Other Ambulatory Visit: Payer: Self-pay | Admitting: Family Medicine

## 2014-06-12 ENCOUNTER — Other Ambulatory Visit: Payer: Self-pay | Admitting: Dermatology

## 2014-08-24 ENCOUNTER — Other Ambulatory Visit: Payer: Self-pay | Admitting: Family Medicine

## 2014-09-07 ENCOUNTER — Other Ambulatory Visit: Payer: Self-pay

## 2014-09-13 ENCOUNTER — Other Ambulatory Visit (INDEPENDENT_AMBULATORY_CARE_PROVIDER_SITE_OTHER): Payer: BC Managed Care – PPO

## 2014-09-13 DIAGNOSIS — Z0000 Encounter for general adult medical examination without abnormal findings: Secondary | ICD-10-CM

## 2014-09-13 LAB — CBC WITH DIFFERENTIAL/PLATELET
BASOS ABS: 0 10*3/uL (ref 0.0–0.1)
Basophils Relative: 0.8 % (ref 0.0–3.0)
Eosinophils Absolute: 0.2 10*3/uL (ref 0.0–0.7)
Eosinophils Relative: 3.7 % (ref 0.0–5.0)
HEMATOCRIT: 45.8 % (ref 39.0–52.0)
Hemoglobin: 15.2 g/dL (ref 13.0–17.0)
LYMPHS ABS: 1.3 10*3/uL (ref 0.7–4.0)
Lymphocytes Relative: 27.4 % (ref 12.0–46.0)
MCHC: 33.2 g/dL (ref 30.0–36.0)
MCV: 92.5 fl (ref 78.0–100.0)
MONO ABS: 0.7 10*3/uL (ref 0.1–1.0)
Monocytes Relative: 15.3 % — ABNORMAL HIGH (ref 3.0–12.0)
Neutro Abs: 2.5 10*3/uL (ref 1.4–7.7)
Neutrophils Relative %: 52.8 % (ref 43.0–77.0)
PLATELETS: 194 10*3/uL (ref 150.0–400.0)
RBC: 4.96 Mil/uL (ref 4.22–5.81)
RDW: 13.2 % (ref 11.5–15.5)
WBC: 4.7 10*3/uL (ref 4.0–10.5)

## 2014-09-13 LAB — LIPID PANEL
Cholesterol: 165 mg/dL (ref 0–200)
HDL: 69.9 mg/dL (ref >39.00)
LDL CALC: 83 mg/dL (ref 0–99)
NONHDL: 95.1
Total CHOL/HDL Ratio: 2
Triglycerides: 62 mg/dL (ref 0.0–149.0)
VLDL: 12.4 mg/dL (ref 0.0–40.0)

## 2014-09-13 LAB — POCT URINALYSIS DIPSTICK
Bilirubin, UA: NEGATIVE
Blood, UA: NEGATIVE
Glucose, UA: NEGATIVE
KETONES UA: NEGATIVE
Leukocytes, UA: NEGATIVE
Nitrite, UA: NEGATIVE
PH UA: 5
PROTEIN UA: NEGATIVE
SPEC GRAV UA: 1.015
Urobilinogen, UA: 0.2

## 2014-09-13 LAB — HEPATIC FUNCTION PANEL
ALBUMIN: 3.5 g/dL (ref 3.5–5.2)
ALT: 80 U/L — ABNORMAL HIGH (ref 0–53)
AST: 55 U/L — ABNORMAL HIGH (ref 0–37)
Alkaline Phosphatase: 46 U/L (ref 39–117)
BILIRUBIN TOTAL: 1.9 mg/dL — AB (ref 0.2–1.2)
Bilirubin, Direct: 0.3 mg/dL (ref 0.0–0.3)
Total Protein: 6.9 g/dL (ref 6.0–8.3)

## 2014-09-13 LAB — BASIC METABOLIC PANEL
BUN: 12 mg/dL (ref 6–23)
CHLORIDE: 101 meq/L (ref 96–112)
CO2: 21 mEq/L (ref 19–32)
Calcium: 9 mg/dL (ref 8.4–10.5)
Creatinine, Ser: 1.2 mg/dL (ref 0.4–1.5)
GFR: 67.6 mL/min (ref >60.00)
GLUCOSE: 83 mg/dL (ref 70–99)
POTASSIUM: 3.8 meq/L (ref 3.5–5.1)
Sodium: 136 mEq/L (ref 135–145)

## 2014-09-13 LAB — PSA: PSA: 3.93 ng/mL (ref 0.10–4.00)

## 2014-09-13 LAB — TSH: TSH: 1.76 u[IU]/mL (ref 0.35–4.50)

## 2014-09-20 ENCOUNTER — Ambulatory Visit (INDEPENDENT_AMBULATORY_CARE_PROVIDER_SITE_OTHER): Payer: BC Managed Care – PPO | Admitting: Family Medicine

## 2014-09-20 ENCOUNTER — Encounter: Payer: Self-pay | Admitting: Family Medicine

## 2014-09-20 VITALS — BP 140/90 | Temp 98.4°F | Ht 67.0 in | Wt 167.0 lb

## 2014-09-20 DIAGNOSIS — I10 Essential (primary) hypertension: Secondary | ICD-10-CM

## 2014-09-20 DIAGNOSIS — E785 Hyperlipidemia, unspecified: Secondary | ICD-10-CM

## 2014-09-20 DIAGNOSIS — Z23 Encounter for immunization: Secondary | ICD-10-CM

## 2014-09-20 MED ORDER — ATENOLOL 50 MG PO TABS: ORAL_TABLET | ORAL | Status: DC

## 2014-09-20 MED ORDER — SIMVASTATIN 20 MG PO TABS: ORAL_TABLET | ORAL | Status: DC

## 2014-09-20 NOTE — Progress Notes (Signed)
Pre visit review using our clinic review tool, if applicable. No additional management support is needed unless otherwise documented below in the visit note. 

## 2014-09-20 NOTE — Progress Notes (Signed)
   Subjective:    Patient ID: Barry Woods, male    DOB: 08/26/1946, 68 y.o.   MRN: [id number]  HPI  [name] is a 68 year old single male,,,,,,,,,, divorced,,,,, nonsmoker,,,,,,,, still employed in the [employer],,,,,, who comes in today for evaluation of hypertension and hyperlipidemia  He takes Tenormin 50 mg daily for hypertension BP 140/90. He also helps his tremor.  He takes Zocor 20 mg daily for hyperlipidemia lipids are at goal with an LDL of 83 HDL 69  He gets routine eye care, dental care, colonoscopy and GI,,,,, mother had a history of colon cancer,,,,,  Vaccinations updated by Apolonio Schneiders  He also sees rheumatology on a regular basis he is on Enbrel for rheumatoid arthritis   Review of Systems  Constitutional: Negative.   HENT: Negative.   Eyes: Negative.   Respiratory: Negative.   Cardiovascular: Negative.   Gastrointestinal: Negative.   Endocrine: Negative.   Genitourinary: Negative.   Musculoskeletal: Negative.   Skin: Negative.   Allergic/Immunologic: Negative.   Neurological: Negative.   Hematological: Negative.   Psychiatric/Behavioral: Negative.        Objective:   Physical Exam  Nursing note and vitals reviewed. Constitutional: He is oriented to person, place, and time. He appears well-developed and well-nourished.  HENT:  Head: Normocephalic and atraumatic.  Right Ear: External ear normal.  Left Ear: External ear normal.  Nose: Nose normal.  Mouth/Throat: Oropharynx is clear and moist.  Eyes: Conjunctivae and EOM are normal. Pupils are equal, round, and reactive to light.  Neck: Normal range of motion. Neck supple. No JVD present. No tracheal deviation present. No thyromegaly present.  Cardiovascular: Normal rate, regular rhythm, normal heart sounds and intact distal pulses.  Exam reveals no gallop and no friction rub.   No murmur heard. Pulmonary/Chest: Effort normal and breath sounds normal. No stridor. No respiratory distress. He has no wheezes.  He has no rales. He exhibits no tenderness.  Abdominal: Soft. Bowel sounds are normal. He exhibits no distension and no mass. There is no tenderness. There is no rebound and no guarding.  Genitourinary: Rectum normal, prostate normal and penis normal. Guaiac negative stool. No penile tenderness.  Musculoskeletal: Normal range of motion. He exhibits no edema and no tenderness.  Lymphadenopathy:    He has no cervical adenopathy.  Neurological: He is alert and oriented to person, place, and time. He has normal reflexes. No cranial nerve deficit. He exhibits normal muscle tone.  Skin: Skin is warm and dry. No rash noted. No erythema. No pallor.  Psychiatric: He has a normal mood and affect. His behavior is normal. Judgment and thought content normal.          Assessment & Plan:  Healthy male  Hypertension continue Tenormin  Hyperlipidemia continue simvastatin 20 mg daily  Rheumatoid arthritis followed by rheumatology

## 2014-09-20 NOTE — Patient Instructions (Signed)
Continue current medications  Follow-up in one year sooner if any problems 

## 2014-09-25 ENCOUNTER — Encounter: Payer: BC Managed Care – PPO | Admitting: Family Medicine

## 2014-10-08 ENCOUNTER — Other Ambulatory Visit: Payer: Self-pay | Admitting: Dermatology

## 2014-12-19 DIAGNOSIS — Z85828 Personal history of other malignant neoplasm of skin: Secondary | ICD-10-CM | POA: Diagnosis not present

## 2014-12-19 DIAGNOSIS — L309 Dermatitis, unspecified: Secondary | ICD-10-CM | POA: Diagnosis not present

## 2014-12-19 DIAGNOSIS — L111 Transient acantholytic dermatosis [Grover]: Secondary | ICD-10-CM | POA: Diagnosis not present

## 2014-12-19 DIAGNOSIS — D235 Other benign neoplasm of skin of trunk: Secondary | ICD-10-CM | POA: Diagnosis not present

## 2015-01-15 ENCOUNTER — Encounter: Payer: Self-pay | Admitting: Family Medicine

## 2015-02-05 DIAGNOSIS — R21 Rash and other nonspecific skin eruption: Secondary | ICD-10-CM | POA: Diagnosis not present

## 2015-02-05 DIAGNOSIS — Z85828 Personal history of other malignant neoplasm of skin: Secondary | ICD-10-CM | POA: Diagnosis not present

## 2015-02-28 DIAGNOSIS — Z85828 Personal history of other malignant neoplasm of skin: Secondary | ICD-10-CM | POA: Diagnosis not present

## 2015-02-28 DIAGNOSIS — L309 Dermatitis, unspecified: Secondary | ICD-10-CM | POA: Diagnosis not present

## 2015-02-28 DIAGNOSIS — L550 Sunburn of first degree: Secondary | ICD-10-CM | POA: Diagnosis not present

## 2015-03-26 ENCOUNTER — Encounter: Payer: Self-pay | Admitting: Internal Medicine

## 2015-03-27 DIAGNOSIS — M0589 Other rheumatoid arthritis with rheumatoid factor of multiple sites: Secondary | ICD-10-CM | POA: Diagnosis not present

## 2015-05-15 DIAGNOSIS — M2550 Pain in unspecified joint: Secondary | ICD-10-CM | POA: Diagnosis not present

## 2015-05-15 DIAGNOSIS — M0589 Other rheumatoid arthritis with rheumatoid factor of multiple sites: Secondary | ICD-10-CM | POA: Diagnosis not present

## 2015-05-20 ENCOUNTER — Other Ambulatory Visit: Payer: Self-pay

## 2015-05-24 ENCOUNTER — Encounter: Payer: Self-pay | Admitting: Internal Medicine

## 2015-05-29 ENCOUNTER — Encounter: Payer: Self-pay | Admitting: Internal Medicine

## 2015-07-01 DIAGNOSIS — M2550 Pain in unspecified joint: Secondary | ICD-10-CM | POA: Diagnosis not present

## 2015-07-01 DIAGNOSIS — M0589 Other rheumatoid arthritis with rheumatoid factor of multiple sites: Secondary | ICD-10-CM | POA: Diagnosis not present

## 2015-07-09 ENCOUNTER — Encounter: Payer: Self-pay | Admitting: Gastroenterology

## 2015-07-10 ENCOUNTER — Encounter: Payer: Self-pay | Admitting: Gastroenterology

## 2015-08-08 ENCOUNTER — Encounter: Payer: Self-pay | Admitting: Internal Medicine

## 2015-08-13 ENCOUNTER — Encounter: Payer: Self-pay | Admitting: Internal Medicine

## 2015-08-26 ENCOUNTER — Other Ambulatory Visit: Payer: Self-pay | Admitting: Family Medicine

## 2015-09-05 ENCOUNTER — Ambulatory Visit (AMBULATORY_SURGERY_CENTER): Payer: Self-pay

## 2015-09-05 VITALS — Ht 67.0 in | Wt 165.2 lb

## 2015-09-05 DIAGNOSIS — Z860100 Personal history of colon polyps, unspecified: Secondary | ICD-10-CM

## 2015-09-05 NOTE — Progress Notes (Signed)
No allergies to eggs or soy No past problems with anesthesia No home oxygen No diet/weight loss meds  Refused emmi 

## 2015-09-19 ENCOUNTER — Encounter: Payer: Self-pay | Admitting: Gastroenterology

## 2015-09-19 ENCOUNTER — Ambulatory Visit (AMBULATORY_SURGERY_CENTER): Payer: Medicare Other | Admitting: Gastroenterology

## 2015-09-19 VITALS — BP 138/86 | HR 67 | Temp 96.5°F | Resp 13 | Ht 67.0 in | Wt 165.0 lb

## 2015-09-19 DIAGNOSIS — Z86010 Personal history of colonic polyps: Secondary | ICD-10-CM

## 2015-09-19 DIAGNOSIS — D123 Benign neoplasm of transverse colon: Secondary | ICD-10-CM

## 2015-09-19 DIAGNOSIS — K635 Polyp of colon: Secondary | ICD-10-CM

## 2015-09-19 DIAGNOSIS — Z800 Family history of malignant neoplasm of digestive organs: Secondary | ICD-10-CM

## 2015-09-19 DIAGNOSIS — Z1211 Encounter for screening for malignant neoplasm of colon: Secondary | ICD-10-CM | POA: Diagnosis not present

## 2015-09-19 DIAGNOSIS — D127 Benign neoplasm of rectosigmoid junction: Secondary | ICD-10-CM | POA: Diagnosis not present

## 2015-09-19 MED ORDER — SODIUM CHLORIDE 0.9 % IV SOLN: 500.0000 mL | INTRAVENOUS | Status: DC

## 2015-09-19 NOTE — Op Note (Signed)
[facility]  [address] [place], [id number]   COLONOSCOPY PROCEDURE REPORT  PATIENT: Barry, Woods  MR#: [id number] BIRTHDATE: 06/02/1946 , 56  yrs. old GENDER: male ENDOSCOPIST: Yetta Flock, MD REFERRED BY: Stevie Kern MD PROCEDURE DATE:  09/19/2015 PROCEDURE:   Colonoscopy, screening and Colonoscopy with biopsy First Screening Colonoscopy - Avg.  risk and is 50 yrs.  old or older - No.  Prior Negative Screening - Now for repeat screening. N/A  History of Adenoma - Now for follow-up colonoscopy & has been > or = to 3 yrs.  N/A  Polyps removed today? Yes ASA CLASS:   Class II INDICATIONS:Screening for colonic neoplasia and FH Colon or Rectal Adenocarcinoma (mother age 3s). MEDICATIONS: Propofol 200 mg IV  DESCRIPTION OF PROCEDURE:   After the risks benefits and alternatives of the procedure were thoroughly explained, informed consent was obtained.  The digital rectal exam revealed no abnormalities of the rectum.   The LB [id number]  endoscope was introduced through the anus and advanced to the cecum, which was identified by both the appendix and ileocecal valve. No adverse events experienced.   The quality of the prep was adequate  The instrument was then slowly withdrawn as the colon was fully examined. Estimated blood loss is zero unless otherwise noted in this procedure report.  COLON FINDINGS: A sessile polyp measuring 4 mm in size was found in the transverse colon.  A polypectomy was performed with cold forceps.  The resection was complete, the polyp tissue was completely retrieved and sent to histology.   Two sessile polyps measuring 3 mm in size were found in the rectosigmoid colon. Polypectomies were performed with cold forceps.  The resection was complete, the polyp tissue was completely retrieved and sent to histology.   The examination was otherwise normal.  Retroflexed views revealed internal hemorrhoids. The time to cecum =  1.8 Withdrawal time = 14.1   The scope was withdrawn and the procedure completed. COMPLICATIONS: There were no immediate complications.  ENDOSCOPIC IMPRESSION: 1.   Sessile polyp was found in the transverse colon; polypectomy was performed with cold forceps 2.   Two sessile polyps were found in the rectosigmoid colon; polypectomies were performed with cold forceps 3.   The examination was otherwise normal  RECOMMENDATIONS: 1.  Await pathology results 2.  Resume diet 3.  Resume medications  eSigned:  Yetta Flock, MD 09/19/2015 8:18 AM   cc: Dorena Cookey, MD, the patient

## 2015-09-19 NOTE — Progress Notes (Signed)
Called to room to assist during endoscopic procedure.  Patient ID and intended procedure confirmed with present staff. Received instructions for my participation in the procedure from the performing physician.  

## 2015-09-19 NOTE — Progress Notes (Signed)
To PACU pt awake and alert, Report to RN

## 2015-09-19 NOTE — Patient Instructions (Signed)
Impressions/recommendations:  Polyps (handout given)  Repeat colonoscopy in 5 years.  YOU HAD AN ENDOSCOPIC PROCEDURE TODAY AT [facility]:   Refer to the procedure report that was given to you for any specific questions about what was found during the examination.  If the procedure report does not answer your questions, please call your gastroenterologist to clarify.  If you requested that your care partner not be given the details of your procedure findings, then the procedure report has been included in a sealed envelope for you to review at your convenience later.  YOU SHOULD EXPECT: Some feelings of bloating in the abdomen. Passage of more gas than usual.  Walking can help get rid of the air that was put into your GI tract during the procedure and reduce the bloating. If you had a lower endoscopy (such as a colonoscopy or flexible sigmoidoscopy) you may notice spotting of blood in your stool or on the toilet paper. If you underwent a bowel prep for your procedure, you may not have a normal bowel movement for a few days.  Please Note:  You might notice some irritation and congestion in your nose or some drainage.  This is from the oxygen used during your procedure.  There is no need for concern and it should clear up in a day or so.  SYMPTOMS TO REPORT IMMEDIATELY:   Following lower endoscopy (colonoscopy or flexible sigmoidoscopy):  Excessive amounts of blood in the stool  Significant tenderness or worsening of abdominal pains  Swelling of the abdomen that is new, acute  Fever of 100F or higher   Following upper endoscopy (EGD)  Vomiting of blood or coffee ground material  New chest pain or pain under the shoulder blades  Painful or persistently difficult swallowing  New shortness of breath  Fever of 100F or higher  Black, tarry-looking stools  For urgent or emergent issues, a gastroenterologist can be reached at any hour by calling [phone].   DIET:  Your first meal following the procedure should be a small meal and then it is ok to progress to your normal diet. Heavy or fried foods are harder to digest and may make you feel nauseous or bloated.  Likewise, meals heavy in dairy and vegetables can increase bloating.  Drink plenty of fluids but you should avoid alcoholic beverages for 24 hours.  ACTIVITY:  You should plan to take it easy for the rest of today and you should NOT DRIVE or use heavy machinery until tomorrow (because of the sedation medicines used during the test).    FOLLOW UP: Our staff will call the number listed on your records the next business day following your procedure to check on you and address any questions or concerns that you may have regarding the information given to you following your procedure. If we do not reach you, we will leave a message.  However, if you are feeling well and you are not experiencing any problems, there is no need to return our call.  We will assume that you have returned to your regular daily activities without incident.  If any biopsies were taken you will be contacted by phone or by letter within the next 1-3 weeks.  Please call us at [phone] if you have not heard about the biopsies in 3 weeks.    SIGNATURES/CONFIDENTIALITY: You and/or your care partner have signed paperwork which will be entered into your electronic medical record.  These signatures attest to the fact that  that the information above on your After Visit Summary has been reviewed and is understood.  Full responsibility of the confidentiality of this discharge information lies with you and/or your care-partner.

## 2015-09-20 ENCOUNTER — Telehealth: Payer: Self-pay

## 2015-09-20 NOTE — Telephone Encounter (Signed)
  Follow up Call-  Call back number 09/19/2015  Post procedure Call Back phone  # [phone]  Permission to leave phone message Yes     Patient questions:  Do you have a fever, pain , or abdominal swelling? No. Pain Score  0 *  Have you tolerated food without any problems? Yes.    Have you been able to return to your normal activities? Yes.    Do you have any questions about your discharge instructions: Diet   No. Medications  No. Follow up visit  No.  Do you have questions or concerns about your Care? No.  Actions: * If pain score is 4 or above: No action needed, pain <4.

## 2015-09-24 ENCOUNTER — Ambulatory Visit (INDEPENDENT_AMBULATORY_CARE_PROVIDER_SITE_OTHER): Payer: Medicare Other | Admitting: Family Medicine

## 2015-09-24 ENCOUNTER — Encounter: Payer: Self-pay | Admitting: Family Medicine

## 2015-09-24 VITALS — BP 130/90 | Temp 98.4°F | Ht 66.75 in | Wt 162.0 lb

## 2015-09-24 DIAGNOSIS — N079 Hereditary nephropathy, not elsewhere classified with unspecified morphologic lesions: Secondary | ICD-10-CM | POA: Diagnosis not present

## 2015-09-24 DIAGNOSIS — G252 Other specified forms of tremor: Secondary | ICD-10-CM

## 2015-09-24 DIAGNOSIS — N401 Enlarged prostate with lower urinary tract symptoms: Secondary | ICD-10-CM | POA: Diagnosis not present

## 2015-09-24 DIAGNOSIS — I10 Essential (primary) hypertension: Secondary | ICD-10-CM | POA: Diagnosis not present

## 2015-09-24 DIAGNOSIS — E785 Hyperlipidemia, unspecified: Secondary | ICD-10-CM

## 2015-09-24 DIAGNOSIS — M05732 Rheumatoid arthritis with rheumatoid factor of left wrist without organ or systems involvement: Secondary | ICD-10-CM

## 2015-09-24 DIAGNOSIS — R351 Nocturia: Secondary | ICD-10-CM

## 2015-09-24 DIAGNOSIS — Z831 Family history of other infectious and parasitic diseases: Secondary | ICD-10-CM

## 2015-09-24 DIAGNOSIS — N029 Recurrent and persistent hematuria with unspecified morphologic changes: Secondary | ICD-10-CM

## 2015-09-24 DIAGNOSIS — Z0000 Encounter for general adult medical examination without abnormal findings: Secondary | ICD-10-CM | POA: Diagnosis not present

## 2015-09-24 DIAGNOSIS — M05731 Rheumatoid arthritis with rheumatoid factor of right wrist without organ or systems involvement: Secondary | ICD-10-CM

## 2015-09-24 LAB — CBC WITH DIFFERENTIAL/PLATELET
BASOS ABS: 0 10*3/uL (ref 0.0–0.1)
Basophils Relative: 0.6 % (ref 0.0–3.0)
EOS PCT: 2 % (ref 0.0–5.0)
Eosinophils Absolute: 0.1 10*3/uL (ref 0.0–0.7)
HEMATOCRIT: 44.4 % (ref 39.0–52.0)
Hemoglobin: 15 g/dL (ref 13.0–17.0)
LYMPHS PCT: 27.7 % (ref 12.0–46.0)
Lymphs Abs: 1.3 10*3/uL (ref 0.7–4.0)
MCHC: 33.9 g/dL (ref 30.0–36.0)
MCV: 94.2 fl (ref 78.0–100.0)
MONOS PCT: 15.4 % — AB (ref 3.0–12.0)
Monocytes Absolute: 0.7 10*3/uL (ref 0.1–1.0)
NEUTROS ABS: 2.5 10*3/uL (ref 1.4–7.7)
Neutrophils Relative %: 54.3 % (ref 43.0–77.0)
PLATELETS: 219 10*3/uL (ref 150.0–400.0)
RBC: 4.71 Mil/uL (ref 4.22–5.81)
RDW: 12.9 % (ref 11.5–15.5)
WBC: 4.7 10*3/uL (ref 4.0–10.5)

## 2015-09-24 LAB — LIPID PANEL
CHOL/HDL RATIO: 2
Cholesterol: 164 mg/dL (ref 0–200)
HDL: 65.9 mg/dL (ref >39.00)
LDL CALC: 80 mg/dL (ref 0–99)
NONHDL: 98.52
Triglycerides: 92 mg/dL (ref 0.0–149.0)
VLDL: 18.4 mg/dL (ref 0.0–40.0)

## 2015-09-24 LAB — POCT URINALYSIS DIPSTICK
Blood, UA: NEGATIVE
GLUCOSE UA: NEGATIVE
Ketones, UA: NEGATIVE
LEUKOCYTES UA: NEGATIVE
NITRITE UA: NEGATIVE
Protein, UA: NEGATIVE
Spec Grav, UA: 1.025
Urobilinogen, UA: 0.2
pH, UA: 6

## 2015-09-24 LAB — HEPATIC FUNCTION PANEL
ALT: 46 U/L (ref 0–53)
AST: 44 U/L — ABNORMAL HIGH (ref 0–37)
Albumin: 3.8 g/dL (ref 3.5–5.2)
Alkaline Phosphatase: 47 U/L (ref 39–117)
BILIRUBIN DIRECT: 0.3 mg/dL (ref 0.0–0.3)
BILIRUBIN TOTAL: 1.7 mg/dL — AB (ref 0.2–1.2)
TOTAL PROTEIN: 6.7 g/dL (ref 6.0–8.3)

## 2015-09-24 LAB — BASIC METABOLIC PANEL
BUN: 8 mg/dL (ref 6–23)
CO2: 29 meq/L (ref 19–32)
Calcium: 9.1 mg/dL (ref 8.4–10.5)
Chloride: 104 mEq/L (ref 96–112)
Creatinine, Ser: 0.99 mg/dL (ref 0.40–1.50)
GFR: 80.1 mL/min (ref >60.00)
GLUCOSE: 92 mg/dL (ref 70–99)
POTASSIUM: 3.9 meq/L (ref 3.5–5.1)
SODIUM: 139 meq/L (ref 135–145)

## 2015-09-24 LAB — TSH: TSH: 1.63 u[IU]/mL (ref 0.35–4.50)

## 2015-09-24 LAB — PSA: PSA: 2.5 ng/mL (ref 0.10–4.00)

## 2015-09-24 MED ORDER — ATENOLOL 50 MG PO TABS: 50.0000 mg | ORAL_TABLET | Freq: Every day | ORAL | Status: DC

## 2015-09-24 MED ORDER — SIMVASTATIN 20 MG PO TABS: ORAL_TABLET | ORAL | Status: DC

## 2015-09-24 NOTE — Progress Notes (Signed)
Pre visit review using our clinic review tool, if applicable. No additional management support is needed unless otherwise documented below in the visit note. 

## 2015-09-24 NOTE — Patient Instructions (Signed)
Labs today  We will also screen you for hepatitis C  Continue current medications diet and exercise  Return in one year sooner if any problems  When you: July for your physical examination............ set that up with Georgina Snell or Almyra Free

## 2015-09-24 NOTE — Progress Notes (Signed)
   Subjective:    Patient ID: Barry Woods, male    DOB: 04-11-1946, 69 y.o.   MRN: [id number]  HPI Barry Woods is a 69 year old single male nonsmoker who comes in today for general physical examination because of a history of hypertension, benign tremor, hyperlipidemia, rheumatoid arthritis  He gets routine eye care, dental care, colonoscopy in October 2016 showed 3 polyps.  Vaccination history tetanus booster 2015 he declines a flu shot. He says it made him sick  Cognitive function normal he walks every day home health safety reviewed no issues identified, no guns in the house, he does have a healthcare power of attorney and living well  He sees his rheumatologist on a regular basis he's on injectable Humira.  He tells me his father died from hepatitis C and he would like to be screened. He has no risk factors that he knows of.    Review of Systems  Constitutional: Negative.   HENT: Negative.   Eyes: Negative.   Respiratory: Negative.   Cardiovascular: Negative.   Gastrointestinal: Negative.   Endocrine: Negative.   Genitourinary: Negative.   Musculoskeletal: Negative.   Skin: Negative.   Allergic/Immunologic: Negative.   Neurological: Negative.   Hematological: Negative.   Psychiatric/Behavioral: Negative.        Objective:   Physical Exam  Constitutional: He is oriented to person, place, and time. He appears well-developed and well-nourished.  HENT:  Head: Normocephalic and atraumatic.  Right Ear: External ear normal.  Left Ear: External ear normal.  Nose: Nose normal.  Mouth/Throat: Oropharynx is clear and moist.  Eyes: Conjunctivae and EOM are normal. Pupils are equal, round, and reactive to light.  Neck: Normal range of motion. Neck supple. No JVD present. No tracheal deviation present. No thyromegaly present.  Cardiovascular: Normal rate, regular rhythm, normal heart sounds and intact distal pulses.  Exam reveals no gallop and no friction rub.   No murmur  heard. No carotid neurologic bruits peripheral pulses 2+ and symmetrical  Pulmonary/Chest: Effort normal and breath sounds normal. No stridor. No respiratory distress. He has no wheezes. He has no rales. He exhibits no tenderness.  Abdominal: Soft. Bowel sounds are normal. He exhibits no distension and no mass. There is no tenderness. There is no rebound and no guarding.  Genitourinary: Rectum normal. Guaiac negative stool. No penile tenderness.  1+ symmetrical nonnodular BPH nocturia 3  Musculoskeletal: Normal range of motion. He exhibits no edema or tenderness.  Lymphadenopathy:    He has no cervical adenopathy.  Neurological: He is alert and oriented to person, place, and time. He has normal reflexes. He displays normal reflexes. No cranial nerve deficit. He exhibits normal muscle tone. Coordination normal.  Benign tremor  Skin: Skin is warm and dry. No rash noted. No erythema. No pallor.  Psychiatric: He has a normal mood and affect. His behavior is normal. Judgment and thought content normal.  Nursing note and vitals reviewed.         Assessment & Plan:  Healthy male  Hypertension at goal,,,,,,, continue Tenormin BP at home 120/80  Hyperlipidemia,,,,,,,, check labs  Rheumatoid arthritis,,,,,,,,,,, continue follow-up by rheumatology and Humira  Benign tremor,,,,,,, continue beta blocker  Family history hepatitis C,,,,,,,,,,, screen for this disease  BPH with nocturia......... check PSA

## 2015-09-25 ENCOUNTER — Encounter: Payer: Self-pay | Admitting: Gastroenterology

## 2015-09-25 LAB — HEPATITIS C ANTIBODY: HCV AB: NEGATIVE

## 2015-09-30 DIAGNOSIS — R945 Abnormal results of liver function studies: Secondary | ICD-10-CM | POA: Diagnosis not present

## 2015-09-30 DIAGNOSIS — M0589 Other rheumatoid arthritis with rheumatoid factor of multiple sites: Secondary | ICD-10-CM | POA: Diagnosis not present

## 2015-11-20 DIAGNOSIS — R945 Abnormal results of liver function studies: Secondary | ICD-10-CM | POA: Diagnosis not present

## 2015-11-20 DIAGNOSIS — M0589 Other rheumatoid arthritis with rheumatoid factor of multiple sites: Secondary | ICD-10-CM | POA: Diagnosis not present

## 2015-11-20 DIAGNOSIS — M25512 Pain in left shoulder: Secondary | ICD-10-CM | POA: Diagnosis not present

## 2015-12-13 DIAGNOSIS — R945 Abnormal results of liver function studies: Secondary | ICD-10-CM | POA: Diagnosis not present

## 2015-12-13 DIAGNOSIS — M25512 Pain in left shoulder: Secondary | ICD-10-CM | POA: Diagnosis not present

## 2015-12-13 DIAGNOSIS — M0589 Other rheumatoid arthritis with rheumatoid factor of multiple sites: Secondary | ICD-10-CM | POA: Diagnosis not present

## 2015-12-23 ENCOUNTER — Telehealth: Payer: Self-pay | Admitting: Family Medicine

## 2015-12-23 NOTE — Telephone Encounter (Signed)
Pt states when he came in for his appointment 09/24/2015, he had medicare. This was coded at a preventative, but should have been coded as a wellness. Pt called his insurance, then called our billing and they sent him right back to his insurance company.

## 2015-12-24 NOTE — Telephone Encounter (Signed)
Juliann Pulse can you please let Mr Tronolone know that we will resubmit his claim and to disregard his current bill. Dr Sherren Mocha does not do wellness exams so this will be sent in as an office visit.

## 2015-12-25 NOTE — Telephone Encounter (Addendum)
Pt has been contacted and aware bill will be resubmitted.

## 2016-04-29 DIAGNOSIS — M0589 Other rheumatoid arthritis with rheumatoid factor of multiple sites: Secondary | ICD-10-CM | POA: Diagnosis not present

## 2016-04-29 DIAGNOSIS — Z79899 Other long term (current) drug therapy: Secondary | ICD-10-CM | POA: Diagnosis not present

## 2016-04-29 DIAGNOSIS — R945 Abnormal results of liver function studies: Secondary | ICD-10-CM | POA: Diagnosis not present

## 2016-04-29 LAB — BASIC METABOLIC PANEL
BUN: 12 mg/dL (ref 4–21)
Creatinine: 1 mg/dL (ref 0.6–1.3)
GLUCOSE: 114 mg/dL
Potassium: 4.2 mmol/L (ref 3.4–5.3)
SODIUM: 139 mmol/L (ref 137–147)

## 2016-04-29 LAB — CBC AND DIFFERENTIAL
HEMATOCRIT: 43 % (ref 41–53)
Hemoglobin: 15.1 g/dL (ref 13.5–17.5)
NEUTROS ABS: 2 /uL
PLATELETS: 163 10*3/uL (ref 150–399)
WBC: 3.8 10^3/mL

## 2016-04-29 LAB — HEPATIC FUNCTION PANEL
ALK PHOS: 50 U/L (ref 25–125)
ALT: 60 U/L — AB (ref 10–40)
AST: 51 U/L — AB (ref 14–40)
BILIRUBIN, TOTAL: 1.3 mg/dL

## 2016-05-14 ENCOUNTER — Encounter: Payer: Self-pay | Admitting: Family Medicine

## 2016-07-01 ENCOUNTER — Ambulatory Visit (INDEPENDENT_AMBULATORY_CARE_PROVIDER_SITE_OTHER)
Admission: RE | Admit: 2016-07-01 | Discharge: 2016-07-01 | Disposition: A | Discharge status: Home / Self Care | Payer: Medicare Other | Source: Ambulatory Visit | Attending: Adult Health | Admitting: Adult Health

## 2016-07-01 ENCOUNTER — Telehealth: Payer: Self-pay | Admitting: Adult Health

## 2016-07-01 ENCOUNTER — Ambulatory Visit (INDEPENDENT_AMBULATORY_CARE_PROVIDER_SITE_OTHER): Payer: Medicare Other | Admitting: Adult Health

## 2016-07-01 ENCOUNTER — Encounter: Payer: Self-pay | Admitting: Adult Health

## 2016-07-01 VITALS — BP 138/74 | HR 68 | Temp 98.6°F | Ht 66.75 in | Wt 165.8 lb

## 2016-07-01 DIAGNOSIS — R05 Cough: Secondary | ICD-10-CM

## 2016-07-01 DIAGNOSIS — J189 Pneumonia, unspecified organism: Secondary | ICD-10-CM

## 2016-07-01 DIAGNOSIS — R058 Other specified cough: Secondary | ICD-10-CM

## 2016-07-01 MED ORDER — DOXYCYCLINE HYCLATE 100 MG PO CAPS: 100.0000 mg | ORAL_CAPSULE | Freq: Two times a day (BID) | ORAL | 0 refills | Status: DC

## 2016-07-01 NOTE — Telephone Encounter (Signed)
Spoke with patient and informed him of his chest x ray. It appears as though he has pneumonia.   He is healthy and has no concerning comorbidites. I will prescribe Doxycycline BID x 10 days.   Order for repeat chest xray placed and he knows to go back in 4 weeks

## 2016-07-01 NOTE — Progress Notes (Addendum)
Subjective:    Patient ID: Barry Woods, male    DOB: Jul 27, 1946, 70 y.o.   MRN: [id number]  HPI  70 year old male who presents to the office today for an acute complaint of a productive cough with blood tinged sputum x 3 months. He reports that when he wakes up in the morning and clears his throat that he has " small specks of blood" in his sputum. As the day goes on the blood tinged sputum decreases but he continues to have a productive cough.   He has never smoked  He denies any fevers, sinus pain or pressure, PND, or feeling acutely ill. He has not been having night sweats  He is currently on day 4 of a prednisone taper for RA flare  Review of Systems  Constitutional: Negative.   HENT: Negative.   Respiratory: Positive for cough. Negative for choking, chest tightness, shortness of breath, wheezing and stridor.   Cardiovascular: Negative.   Skin: Negative.   Neurological: Negative.   All other systems reviewed and are negative.  Past Medical History:  Diagnosis Date  . Arthritis   . Hyperlipidemia   . Hypertension     Social History   Social History  . Marital status: Divorced    Spouse name: N/A  . Number of children: N/A  . Years of education: N/A   Occupational History  . Not on file.   Social History Main Topics  . Smoking status: Never Smoker  . Smokeless tobacco: Never Used  . Alcohol use 8.4 oz/week    14 Cans of beer per week  . Drug use: No  . Sexual activity: Not on file   Other Topics Concern  . Not on file   Social History Narrative  . No narrative on file    Past Surgical History:  Procedure Laterality Date  . COLONOSCOPY    . LIPOMA EXCISION    . PALATE / UVULA BIOPSY / EXCISION    . TONSILLECTOMY     age 50    Family History  Problem Relation Age of Onset  . Colon cancer Mother 49    57  . Heart disease Father     Allergies  Allergen Reactions  . Atorvastatin     REACTION: hives  . Cetirizine Hcl     REACTION: hives     Current Outpatient Prescriptions on File Prior to Visit  Medication Sig Dispense Refill  . Adalimumab (HUMIRA) 40 MG/0.8ML PSKT Inject into the skin. Every other week    . atenolol (TENORMIN) 50 MG tablet Take 1 tablet (50 mg total) by mouth daily. 90 tablet 3  . simvastatin (ZOCOR) 20 MG tablet take 1 tablet by mouth at bedtime 100 tablet 3   No current facility-administered medications on file prior to visit.     BP 138/74   Pulse 68   Temp 98.6 F (37 C) (Oral)   Ht 5' 6.75" (1.695 m)   Wt 165 lb 12.8 oz (75.2 kg)   SpO2 97%   BMI 26.16 kg/m       Objective:   Physical Exam  Constitutional: He is oriented to person, place, and time. He appears well-developed and well-nourished. No distress.  HENT:  Head: Normocephalic and atraumatic.  Right Ear: External ear normal.  Left Ear: External ear normal.  Nose: Nose normal.  Mouth/Throat: Oropharynx is clear and moist. No oropharyngeal exudate.  Eyes: Conjunctivae and EOM are normal. Pupils are equal, round, and reactive to  light. Right eye exhibits no discharge. Left eye exhibits no discharge. No scleral icterus.  Neck: Normal range of motion. Neck supple.  Cardiovascular: Normal rate, regular rhythm, normal heart sounds and intact distal pulses.  Exam reveals no gallop and no friction rub.   No murmur heard. Pulmonary/Chest: Effort normal and breath sounds normal. No respiratory distress. He has no wheezes. He has no rales. He exhibits no tenderness.  Neurological: He is alert and oriented to person, place, and time. He has normal reflexes.  Skin: Skin is warm and dry. No rash noted. He is not diaphoretic. No erythema. No pallor.  Psychiatric: He has a normal mood and affect. His behavior is normal. Judgment and thought content normal.  Nursing note and vitals reviewed.     Assessment & Plan:  1. Productive cough - Cannot r/o pneumonia at this time. Doubt TB or other pulmonary disease - DG Chest 2 View; Future -  Consider referral to pulmonary  - Consider abx therapy   Dorothyann Peng, NP

## 2016-07-02 ENCOUNTER — Telehealth: Payer: Self-pay | Admitting: Family Medicine

## 2016-07-02 NOTE — Telephone Encounter (Signed)
See below

## 2016-07-02 NOTE — Telephone Encounter (Signed)
Pt saw cory yesterday and has a couple of questionS he would like to ask cory concerning his PNA

## 2016-07-03 NOTE — Telephone Encounter (Signed)
Pt returning your call. Please call back °

## 2016-07-28 ENCOUNTER — Telehealth: Payer: Self-pay | Admitting: Family Medicine

## 2016-07-28 ENCOUNTER — Ambulatory Visit (INDEPENDENT_AMBULATORY_CARE_PROVIDER_SITE_OTHER)
Admission: RE | Admit: 2016-07-28 | Discharge: 2016-07-28 | Disposition: A | Discharge status: Home / Self Care | Payer: Medicare Other | Source: Ambulatory Visit | Attending: Adult Health | Admitting: Adult Health

## 2016-07-28 DIAGNOSIS — J189 Pneumonia, unspecified organism: Secondary | ICD-10-CM

## 2016-07-28 MED ORDER — LEVOFLOXACIN 750 MG PO TABS: 750.0000 mg | ORAL_TABLET | Freq: Every day | ORAL | 0 refills | Status: DC

## 2016-07-28 NOTE — Telephone Encounter (Signed)
° °  Pt call to say he had his xray this morning and will await a call back with the results

## 2016-07-28 NOTE — Telephone Encounter (Signed)
Spoke to Mr. Guggenheim after his follow up x ray for pneumonia. He continues to have pneumonia in right middle lobe. He endorses feeling " great". He is walking and running daily without difficulty.   I will prescribe Levaquin 750 mg daily x 7 days.   Repeat chest x ray in 3 weeks.

## 2016-07-28 NOTE — Telephone Encounter (Signed)
See below

## 2016-08-13 DIAGNOSIS — Z85828 Personal history of other malignant neoplasm of skin: Secondary | ICD-10-CM | POA: Diagnosis not present

## 2016-08-13 DIAGNOSIS — D485 Neoplasm of uncertain behavior of skin: Secondary | ICD-10-CM | POA: Diagnosis not present

## 2016-08-13 DIAGNOSIS — L720 Epidermal cyst: Secondary | ICD-10-CM | POA: Diagnosis not present

## 2016-08-25 ENCOUNTER — Ambulatory Visit (INDEPENDENT_AMBULATORY_CARE_PROVIDER_SITE_OTHER)
Admission: RE | Admit: 2016-08-25 | Discharge: 2016-08-25 | Disposition: A | Discharge status: Home / Self Care | Payer: Medicare Other | Source: Ambulatory Visit | Attending: Adult Health | Admitting: Adult Health

## 2016-08-25 DIAGNOSIS — J189 Pneumonia, unspecified organism: Secondary | ICD-10-CM

## 2016-08-26 ENCOUNTER — Other Ambulatory Visit: Payer: Self-pay | Admitting: Adult Health

## 2016-08-26 DIAGNOSIS — J189 Pneumonia, unspecified organism: Secondary | ICD-10-CM

## 2016-08-26 NOTE — Progress Notes (Signed)
Spoke with patient informed him of his most recent chest x-ray. He reports that his feeling fine and he is out walking has no issues with shortness of breath, fevers, or feeling acutely ill.  Due to two rounds of antibiotics over the last 2 months and his pneumonia has not resolved,  I am going to send him to pulmonary for further evaluation and possible bronch

## 2016-09-01 ENCOUNTER — Ambulatory Visit (INDEPENDENT_AMBULATORY_CARE_PROVIDER_SITE_OTHER): Payer: Medicare Other | Admitting: Pulmonary Disease

## 2016-09-01 ENCOUNTER — Other Ambulatory Visit (INDEPENDENT_AMBULATORY_CARE_PROVIDER_SITE_OTHER): Payer: Medicare Other

## 2016-09-01 ENCOUNTER — Encounter: Payer: Self-pay | Admitting: Pulmonary Disease

## 2016-09-01 VITALS — BP 130/84 | HR 69 | Ht 68.0 in | Wt 166.6 lb

## 2016-09-01 DIAGNOSIS — J189 Pneumonia, unspecified organism: Secondary | ICD-10-CM

## 2016-09-01 DIAGNOSIS — J181 Lobar pneumonia, unspecified organism: Secondary | ICD-10-CM | POA: Diagnosis not present

## 2016-09-01 LAB — CBC
HEMATOCRIT: 43.3 % (ref 39.0–52.0)
Hemoglobin: 15.2 g/dL (ref 13.0–17.0)
MCHC: 35.2 g/dL (ref 30.0–36.0)
MCV: 91.8 fl (ref 78.0–100.0)
Platelets: 174 10*3/uL (ref 150.0–400.0)
RBC: 4.72 Mil/uL (ref 4.22–5.81)
RDW: 14.7 % (ref 11.5–15.5)
WBC: 7.7 10*3/uL (ref 4.0–10.5)

## 2016-09-01 LAB — COMPREHENSIVE METABOLIC PANEL
ALBUMIN: 3.9 g/dL (ref 3.5–5.2)
ALK PHOS: 52 U/L (ref 39–117)
ALT: 55 U/L — AB (ref 0–53)
AST: 43 U/L — AB (ref 0–37)
BILIRUBIN TOTAL: 1.1 mg/dL (ref 0.2–1.2)
BUN: 11 mg/dL (ref 6–23)
CO2: 31 mEq/L (ref 19–32)
CREATININE: 1.04 mg/dL (ref 0.40–1.50)
Calcium: 9.9 mg/dL (ref 8.4–10.5)
Chloride: 104 mEq/L (ref 96–112)
GFR: 75.46 mL/min (ref >60.00)
Glucose, Bld: 100 mg/dL — ABNORMAL HIGH (ref 70–99)
POTASSIUM: 4.8 meq/L (ref 3.5–5.1)
SODIUM: 140 meq/L (ref 135–145)
TOTAL PROTEIN: 7.7 g/dL (ref 6.0–8.3)
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
## 2016-09-01 NOTE — Assessment & Plan Note (Signed)
Pt with RA x 6 yrs.  Was on Humira until 06/2016. Currently on prednisone 10 mg/d.  Need to let rheumatologist know when he will be cleared for Humira.

## 2016-09-01 NOTE — Patient Instructions (Signed)
It was a pleasure taking care of you today!  You are diagnosed with pneumonia.  We will obtain blood work (CBC, CMP, PT/INR, PTT) . We will obtain a chest CT scan with contrast and call you with results.  Return to clinic in 6 weeks with Dr. Corrie Dandy or NP

## 2016-09-01 NOTE — Assessment & Plan Note (Signed)
Patient is being seen for pneumonia. Nonsmoker. Has rheumatoid arthritis for 6 years and no complications with medicine. He was on Humira which was stopped in August 2017 secondary to pneumonia. Patient has had hemoptysis since the start of the year. He got 2 rounds of antibiotics with doxycycline and Levaquin. Hemoptysis is better but still coughs daily. No recent travel. No pets. Not on blood thinners. Denies fevers and chills. Has x-ray in August 2017, September 2017, October 2017 showed right infrahilar infiltrate/haziness as well as right lower lobe haziness. October 2017 looks better compared to the previous x-rays but still with persistent infiltrate.  Differentials: 1. Non resolving pneumonia. Since he was on humira, he can have opportunistic infection. He had TB test in 2016 which was negative. Possible atypical infection or fungal infection or mycobacterial infection.  2. Malignancy.   Plan: 1. Chest CT scan with contrast. We will expedite CAT scan. We will call patient once with results. If it's concerning for infection, patient will need bronchoscopy with biopsy and lavage. Extensively discussed the bronchoscopy with the patient as well. 2. Cbc, cmp, coags.

## 2016-09-04 ENCOUNTER — Ambulatory Visit (INDEPENDENT_AMBULATORY_CARE_PROVIDER_SITE_OTHER)
Admission: RE | Admit: 2016-09-04 | Discharge: 2016-09-04 | Disposition: A | Discharge status: Home / Self Care | Payer: Medicare Other | Source: Ambulatory Visit | Attending: Pulmonary Disease | Admitting: Pulmonary Disease

## 2016-09-04 DIAGNOSIS — J181 Lobar pneumonia, unspecified organism: Secondary | ICD-10-CM | POA: Diagnosis not present

## 2016-09-04 DIAGNOSIS — J189 Pneumonia, unspecified organism: Secondary | ICD-10-CM | POA: Diagnosis not present

## 2016-09-04 MED ORDER — IOPAMIDOL (ISOVUE-300) INJECTION 61%
80.0000 mL | Freq: Once | INTRAVENOUS | Status: AC | PRN
  Administered 2016-09-04: 80 mL via INTRAVENOUS

## 2016-09-07 ENCOUNTER — Telehealth: Payer: Self-pay | Admitting: Pulmonary Disease

## 2016-09-07 DIAGNOSIS — R918 Other nonspecific abnormal finding of lung field: Secondary | ICD-10-CM

## 2016-09-07 NOTE — Telephone Encounter (Signed)
   I called up pt and discussed with him results of chest ct scan.  He has Reticular infiltrates at RLL and some at RLL and LLL. Clinically, he is OK. (-) subj complinats.  Still off humira. On pred 10 mg daily. Denies choking. GERD sx.   Recent exposure to an old place with mold but that was until aug 2017.  Recent smoking of weed in dec 2016.   Plan : 1. Ashtyn : can we schedule pt for a f/u chest ct scan 6 weeks from oct 13. No contrast. Pls do HRCT instead. Dx: ILD. Can I see pt after the HRCT scan is done?  2. Cont prednisone 10 mg/d for now for RA. Hold off on Humira.  3. Told pt to call if with sx/ signs of infection. Will need bronch and Bx at that point.   Monica Becton, MD 09/07/2016, 6:01 PM [facility] Pager [phone] After 3 pm or if no answer, call [phone]

## 2016-09-07 NOTE — Telephone Encounter (Signed)
Patient states that Dr. Corrie Dandy attempted to contact him and left a voicemail message on his phone regarding his CT results.  He says that he did not answer the phone because it was an unlisted number that showed up on the screen, but if Dr. Corrie Dandy will call him back, he says he will answer.  Wants to know results of CT and if anything is to be called in for his Pneumonia.    Dr. Corrie Dandy, please advise.

## 2016-09-09 NOTE — Telephone Encounter (Signed)
Patient called back regarding results that AD discussed with him and questioned date of f/u CT - advised that order will be placed and then authorized through ins carrier, then he will be contacted. He requested a call back from the nurse. He can be reached at [phone] -pr

## 2016-09-09 NOTE — Telephone Encounter (Signed)
Spoke with pt. He is aware of AD's message. Order has been placed for HRCT. He requested to have his OV moved back to the end of November so that AD could go over his CT results at his OV. Nothing further was needed at this time.

## 2016-10-09 ENCOUNTER — Ambulatory Visit: Payer: Medicare Other | Admitting: Pulmonary Disease

## 2016-10-19 ENCOUNTER — Ambulatory Visit (INDEPENDENT_AMBULATORY_CARE_PROVIDER_SITE_OTHER)
Admission: RE | Admit: 2016-10-19 | Discharge: 2016-10-19 | Disposition: A | Discharge status: Home / Self Care | Payer: Medicare Other | Source: Ambulatory Visit | Attending: Pulmonary Disease | Admitting: Pulmonary Disease

## 2016-10-19 DIAGNOSIS — R918 Other nonspecific abnormal finding of lung field: Secondary | ICD-10-CM

## 2016-10-21 ENCOUNTER — Ambulatory Visit (INDEPENDENT_AMBULATORY_CARE_PROVIDER_SITE_OTHER): Payer: Medicare Other | Admitting: Pulmonary Disease

## 2016-10-21 ENCOUNTER — Encounter: Payer: Self-pay | Admitting: Pulmonary Disease

## 2016-10-21 VITALS — BP 142/90 | HR 79 | Ht 68.0 in | Wt 170.6 lb

## 2016-10-21 DIAGNOSIS — M05732 Rheumatoid arthritis with rheumatoid factor of left wrist without organ or systems involvement: Secondary | ICD-10-CM

## 2016-10-21 DIAGNOSIS — J849 Interstitial pulmonary disease, unspecified: Secondary | ICD-10-CM | POA: Insufficient documentation

## 2016-10-21 DIAGNOSIS — M05731 Rheumatoid arthritis with rheumatoid factor of right wrist without organ or systems involvement: Secondary | ICD-10-CM

## 2016-10-21 NOTE — Progress Notes (Signed)
Subjective:    Patient ID: Barry Woods, male    DOB: [birth year], 70 y.o.   MRN: [id number]  HPI   This is the case of Barry Woods, 70 y.o. Male, who was referred by Dorothyann Peng PA in consultation regarding PNA  As you very well know, patient is a never smoker, not known to have asthma or COPD. Patient has had RA x 6 yrs now.   Patient started having cough with blood tinged sputum start of the year. Symptoms persisted so he ended up seeing PCP.  CXR showed RML/infrahilar infiltrates. He was placed on Doxycycline x 10days and Levaquin x 7 days. levaquin was given as CXR showed persistent infiltrate.   He still coughs yellow sputum daily. (-) hemoptysis. He is back to baseline, walks/run 2 miles daily.  Denies SOB. (-) fever/chills/weioght changes.   CXR showed persistent infiltrate in 08/2016.   Pts Humira was discontinued in 06/2016 and was placed on Prednisone 10 mg/day 2/2 cough/PNA.   He had a PTB test in 2016 which was (-).   Pt had OSA when he was 70 yo and had tonsillectomy/uvulectomy. Rpt PSG was (-).   ROV 10/21/16  Pt sees Dr. Gershon Cull Rheum [id number] for his RA. Pt was dxed with RA in 2011. He was on Embrel but was switched to Humira after 4 yrs because it stopped working.  RA has been controlled on Humira.  Humira was switched to prednisone  in 06/2016 because of concern for PNA. RA is controlled with pred. Since last seen, pt states he is [place].  He walks/runs 1.5 miles daiy.  Occasional cough related to sinus issues in am.  Occasional GERD sx aggravated by diet. (-) fevers, chills. No recent abx use.   Review of Systems  Constitutional: Negative.  Negative for fever and unexpected weight change.  HENT: Positive for congestion. Negative for dental problem, ear pain, nosebleeds, postnasal drip, rhinorrhea, sinus pressure, sneezing, sore throat and trouble swallowing.   Eyes: Negative.  Negative for redness and itching.  Respiratory: Positive for cough  and wheezing. Negative for chest tightness and shortness of breath.   Cardiovascular: Negative.  Negative for palpitations and leg swelling.  Gastrointestinal: Negative.  Negative for nausea and vomiting.  Endocrine: Negative.   Genitourinary: Negative.  Negative for dysuria.  Musculoskeletal: Negative.  Negative for joint swelling.  Skin: Negative.  Negative for rash.  Allergic/Immunologic: Negative.   Neurological: Negative.  Negative for headaches.  Hematological: Negative.  Does not bruise/bleed easily.  Psychiatric/Behavioral: Negative.  Negative for dysphoric mood. The patient is not nervous/anxious.        Objective:   Physical Exam  Vitals:  Vitals:   10/21/16 0932  BP: (!) 142/90  Pulse: 79  SpO2: 98%  Weight: 170 lb 9.6 oz (77.4 kg)  Height: 5\' 8"  (1.727 m)    Constitutional/General:  Pleasant, well-nourished, well-developed, not in any distress,  Comfortably seating.  Well kempt  Body mass index is 25.94 kg/m. Wt Readings from Last 3 Encounters:  10/21/16 170 lb 9.6 oz (77.4 kg)  09/01/16 166 lb 9.6 oz (75.6 kg)  07/01/16 165 lb 12.8 oz (75.2 kg)     HEENT: Pupils equal and reactive to light and accommodation. Anicteric sclerae. Normal nasal mucosa.   No oral  lesions,  mouth clear,  oropharynx clear, no postnasal drip. (-) Oral thrush. No dental caries. S/P UPP Airway - Mallampati class II  Neck: No masses. Midline trachea. No JVD, (-) LAD. (-)  bruits appreciated.  Respiratory/Chest: Grossly normal chest. (-) deformity. (-) Accessory muscle use.  Symmetric expansion. (-) Tenderness on palpation.  Resonant on percussion.  Diminished BS on both lower lung zones. (-) wheezing, rhonchi. Occasional crackles at the R base.  (-) egophony  Cardiovascular: Regular rate and  rhythm, heart sounds normal, no murmur or gallops, no peripheral edema  Gastrointestinal:  Normal bowel sounds. Soft, non-tender. No hepatosplenomegaly.  (-) masses.   Musculoskeletal:   Normal muscle tone. Normal gait.   Extremities: Grossly normal. (-) clubbing, cyanosis.  (-) edema  Skin: (-) rash,lesions seen.   Neurological/Psychiatric : alert, oriented to time, place, person. Normal mood and affect          Assessment & Plan:  No problem-specific Assessment & Plan notes found for this encounter.   I personally reviewed previous images (Chest Xray, Chest Ct scan) done on this patient. I reviewed the reports on the images as well.    Thank you very much for letting me participate in this patient's care. Please do not hesitate to give me a call if you have any questions or concerns regarding the treatment plan.   Patient will follow up with me in 6 weeks.    I spent at least 25 minutes with the patient today and more than 50% was spent counseling the patient regarding disease and management and facilitating labs and medications.       Monica Becton, MD 10/21/2016   9:40 AM [facility] [place] Pager: [phone] Office: [phone], Fax: [phone]

## 2016-10-21 NOTE — Assessment & Plan Note (Signed)
Pt with RA x 6 yrs.  Was on Humira until 06/2016. Currently on prednisone 10 mg/d.  Pt sees Dr. Leigh Aurora. Discussed case with Dr. Amil Amen > plan to transition pt back to Humira.

## 2016-10-21 NOTE — Assessment & Plan Note (Signed)
Patient was initially seen for pneumonia. Nonsmoker. Has rheumatoid arthritis for 6 years and no complications with medicine. He was on Humira which was stopped in August 2017 secondary to pneumonia. Patient has had hemoptysis since the start of the year. He got 2 rounds of antibiotics with doxycycline and Levaquin. Hemoptysis is better but still coughs daily. No recent travel. No pets. Not on blood thinners. Denies fevers and chills. Has x-ray in August 2017, September 2017, October 2017 showed right infrahilar infiltrate/haziness as well as right lower lobe haziness. October 2017 looks better compared to the previous x-rays but still with persistent infiltrate.  Patient has had 2 chest ct scans (Oct and Nov, 6 weeks apart) and ct scans have been stable.  Pt has a RLL nonspecific ground glass subpleural reticula changes with bronchiectasis.  (-) honey combing or classic findings for IPF.   Differential : 1. ILD related to RA 2. Idiopathic ILD (less likely) 3. Related to previous infection.  4. Possibly related to meds (Infliximab)  I discussed case with pt and pt's Rheumatologist, Dr. Leigh Aurora.   1. As pt is at baseline and not too symptomatic, we will probably observe for now.  Will hold off on bx.   2. Plan for a rpt HRCT in 03/2017 unless he gets more symptomatic 3.  As he is not having fevers, chills, and is well, and no evidence for infection, we will transition to Humira. Obviously, if he gets more symptomatic on infliximab, pt will need a Dx bronch to R/O OI.

## 2016-10-21 NOTE — Patient Instructions (Signed)
It was a pleasure taking care of you today!  You are diagnosed with Interstitial Lung Disease (ILD).  This usually causes swelling and/or scarring of your lungs causing you to be short of breath, have cough, or have low oxygen level.   We will plan on getting a chest CT scan in 6 months.  Please call the office if your symptoms worsen. You may have a flare up of ILD and this may require steroids and antibiotics.   Please make sure your vaccines are up to date.   Return to clinic after the chest CT scan.

## 2016-11-04 ENCOUNTER — Encounter: Payer: Medicare Other | Admitting: Family Medicine

## 2016-11-09 ENCOUNTER — Other Ambulatory Visit: Payer: Self-pay | Admitting: Family Medicine

## 2016-11-09 DIAGNOSIS — E785 Hyperlipidemia, unspecified: Secondary | ICD-10-CM

## 2016-11-19 ENCOUNTER — Other Ambulatory Visit: Payer: Self-pay | Admitting: Family Medicine

## 2016-12-15 DIAGNOSIS — Z79899 Other long term (current) drug therapy: Secondary | ICD-10-CM | POA: Diagnosis not present

## 2016-12-15 DIAGNOSIS — R945 Abnormal results of liver function studies: Secondary | ICD-10-CM | POA: Diagnosis not present

## 2016-12-15 DIAGNOSIS — Z6825 Body mass index (BMI) 25.0-25.9, adult: Secondary | ICD-10-CM | POA: Diagnosis not present

## 2016-12-15 DIAGNOSIS — M0589 Other rheumatoid arthritis with rheumatoid factor of multiple sites: Secondary | ICD-10-CM | POA: Diagnosis not present

## 2016-12-15 DIAGNOSIS — E663 Overweight: Secondary | ICD-10-CM | POA: Diagnosis not present

## 2017-01-15 DIAGNOSIS — M0589 Other rheumatoid arthritis with rheumatoid factor of multiple sites: Secondary | ICD-10-CM | POA: Diagnosis not present

## 2017-01-27 DIAGNOSIS — L821 Other seborrheic keratosis: Secondary | ICD-10-CM | POA: Diagnosis not present

## 2017-01-27 DIAGNOSIS — D224 Melanocytic nevi of scalp and neck: Secondary | ICD-10-CM | POA: Diagnosis not present

## 2017-01-27 DIAGNOSIS — D485 Neoplasm of uncertain behavior of skin: Secondary | ICD-10-CM | POA: Diagnosis not present

## 2017-01-27 DIAGNOSIS — L814 Other melanin hyperpigmentation: Secondary | ICD-10-CM | POA: Diagnosis not present

## 2017-01-27 DIAGNOSIS — D2271 Melanocytic nevi of right lower limb, including hip: Secondary | ICD-10-CM | POA: Diagnosis not present

## 2017-01-27 DIAGNOSIS — D1801 Hemangioma of skin and subcutaneous tissue: Secondary | ICD-10-CM | POA: Diagnosis not present

## 2017-01-27 DIAGNOSIS — Z85828 Personal history of other malignant neoplasm of skin: Secondary | ICD-10-CM | POA: Diagnosis not present

## 2017-01-27 DIAGNOSIS — D225 Melanocytic nevi of trunk: Secondary | ICD-10-CM | POA: Diagnosis not present

## 2017-01-27 DIAGNOSIS — D2272 Melanocytic nevi of left lower limb, including hip: Secondary | ICD-10-CM | POA: Diagnosis not present

## 2017-01-27 DIAGNOSIS — D171 Benign lipomatous neoplasm of skin and subcutaneous tissue of trunk: Secondary | ICD-10-CM | POA: Diagnosis not present

## 2017-01-27 DIAGNOSIS — D235 Other benign neoplasm of skin of trunk: Secondary | ICD-10-CM | POA: Diagnosis not present

## 2017-02-02 ENCOUNTER — Encounter: Payer: Medicare Other | Admitting: Family Medicine

## 2017-02-04 ENCOUNTER — Other Ambulatory Visit: Payer: Self-pay | Admitting: Family Medicine

## 2017-02-04 ENCOUNTER — Telehealth: Payer: Self-pay | Admitting: Family Medicine

## 2017-02-04 DIAGNOSIS — E785 Hyperlipidemia, unspecified: Secondary | ICD-10-CM

## 2017-02-04 NOTE — Telephone Encounter (Signed)
Needs follow up.  May refill #30 only until follow up.

## 2017-02-04 NOTE — Telephone Encounter (Signed)
Last visit w/Todd 09/24/2015 Acute visit w/Cory 07/01/16 No recent labs. Please advise. Thanks!!

## 2017-02-04 NOTE — Telephone Encounter (Signed)
#  30 sent to the pharmacy by e-scribe.  Message sent to scheduling.

## 2017-02-04 NOTE — Telephone Encounter (Signed)
Pt needs follow up or cpx within the next 30 days.  Please help pt obtain appt.  Thanks!!

## 2017-02-05 NOTE — Telephone Encounter (Signed)
lmom for pt to call back

## 2017-02-05 NOTE — Telephone Encounter (Signed)
Pt already sch for cpx on 02-16-17

## 2017-02-11 DIAGNOSIS — L821 Other seborrheic keratosis: Secondary | ICD-10-CM | POA: Diagnosis not present

## 2017-02-11 DIAGNOSIS — Z85828 Personal history of other malignant neoplasm of skin: Secondary | ICD-10-CM | POA: Diagnosis not present

## 2017-02-16 ENCOUNTER — Ambulatory Visit (INDEPENDENT_AMBULATORY_CARE_PROVIDER_SITE_OTHER): Payer: Medicare Other | Admitting: Family Medicine

## 2017-02-16 ENCOUNTER — Encounter: Payer: Self-pay | Admitting: Family Medicine

## 2017-02-16 VITALS — BP 120/80 | Temp 98.4°F | Ht 66.5 in | Wt 172.0 lb

## 2017-02-16 DIAGNOSIS — R351 Nocturia: Secondary | ICD-10-CM

## 2017-02-16 DIAGNOSIS — E785 Hyperlipidemia, unspecified: Secondary | ICD-10-CM

## 2017-02-16 DIAGNOSIS — R931 Abnormal findings on diagnostic imaging of heart and coronary circulation: Secondary | ICD-10-CM

## 2017-02-16 DIAGNOSIS — M069 Rheumatoid arthritis, unspecified: Secondary | ICD-10-CM

## 2017-02-16 DIAGNOSIS — G250 Essential tremor: Secondary | ICD-10-CM

## 2017-02-16 DIAGNOSIS — N401 Enlarged prostate with lower urinary tract symptoms: Secondary | ICD-10-CM

## 2017-02-16 DIAGNOSIS — Z23 Encounter for immunization: Secondary | ICD-10-CM

## 2017-02-16 DIAGNOSIS — I10 Essential (primary) hypertension: Secondary | ICD-10-CM

## 2017-02-16 DIAGNOSIS — Z136 Encounter for screening for cardiovascular disorders: Secondary | ICD-10-CM | POA: Diagnosis not present

## 2017-02-16 DIAGNOSIS — E7800 Pure hypercholesterolemia, unspecified: Secondary | ICD-10-CM

## 2017-02-16 LAB — CBC WITH DIFFERENTIAL/PLATELET
BASOS ABS: 0 10*3/uL (ref 0.0–0.1)
BASOS PCT: 0.9 % (ref 0.0–3.0)
EOS ABS: 0.2 10*3/uL (ref 0.0–0.7)
Eosinophils Relative: 4.2 % (ref 0.0–5.0)
HCT: 44.5 % (ref 39.0–52.0)
HEMOGLOBIN: 15.4 g/dL (ref 13.0–17.0)
Lymphocytes Relative: 28.8 % (ref 12.0–46.0)
Lymphs Abs: 1.4 10*3/uL (ref 0.7–4.0)
MCHC: 34.6 g/dL (ref 30.0–36.0)
MCV: 93.5 fl (ref 78.0–100.0)
MONO ABS: 0.8 10*3/uL (ref 0.1–1.0)
Monocytes Relative: 16.1 % — ABNORMAL HIGH (ref 3.0–12.0)
Neutro Abs: 2.4 10*3/uL (ref 1.4–7.7)
Neutrophils Relative %: 50 % (ref 43.0–77.0)
Platelets: 169 10*3/uL (ref 150.0–400.0)
RBC: 4.75 Mil/uL (ref 4.22–5.81)
RDW: 13.4 % (ref 11.5–15.5)
WBC: 4.8 10*3/uL (ref 4.0–10.5)

## 2017-02-16 LAB — BASIC METABOLIC PANEL
BUN: 10 mg/dL (ref 6–23)
CALCIUM: 9.1 mg/dL (ref 8.4–10.5)
CHLORIDE: 100 meq/L (ref 96–112)
CO2: 29 mEq/L (ref 19–32)
CREATININE: 0.93 mg/dL (ref 0.40–1.50)
GFR: 85.74 mL/min (ref >60.00)
Glucose, Bld: 98 mg/dL (ref 70–99)
Potassium: 3.7 mEq/L (ref 3.5–5.1)
SODIUM: 136 meq/L (ref 135–145)

## 2017-02-16 LAB — POCT URINALYSIS DIPSTICK
Bilirubin, UA: NEGATIVE
Blood, UA: NEGATIVE
Glucose, UA: NEGATIVE
Ketones, UA: NEGATIVE
Leukocytes, UA: NEGATIVE
NITRITE UA: NEGATIVE
PROTEIN UA: NEGATIVE
SPEC GRAV UA: 1.03 (ref 1.030–1.035)
UROBILINOGEN UA: 0.2 (ref ?–2.0)
pH, UA: 6 (ref 5.0–8.0)

## 2017-02-16 LAB — HEPATIC FUNCTION PANEL
ALBUMIN: 4.1 g/dL (ref 3.5–5.2)
ALT: 57 U/L — ABNORMAL HIGH (ref 0–53)
AST: 47 U/L — ABNORMAL HIGH (ref 0–37)
Alkaline Phosphatase: 47 U/L (ref 39–117)
Bilirubin, Direct: 0.2 mg/dL (ref 0.0–0.3)
TOTAL PROTEIN: 7.6 g/dL (ref 6.0–8.3)
Total Bilirubin: 1.5 mg/dL — ABNORMAL HIGH (ref 0.2–1.2)

## 2017-02-16 LAB — LIPID PANEL
CHOL/HDL RATIO: 3
CHOLESTEROL: 210 mg/dL — AB (ref 0–200)
HDL: 81.5 mg/dL (ref >39.00)
LDL CALC: 105 mg/dL — AB (ref 0–99)
NonHDL: 128.49
TRIGLYCERIDES: 119 mg/dL (ref 0.0–149.0)
VLDL: 23.8 mg/dL (ref 0.0–40.0)

## 2017-02-16 LAB — TSH: TSH: 1.83 u[IU]/mL (ref 0.35–4.50)

## 2017-02-16 LAB — PSA: PSA: 2.57 ng/mL (ref 0.10–4.00)

## 2017-02-16 MED ORDER — SIMVASTATIN 20 MG PO TABS: 20.0000 mg | ORAL_TABLET | Freq: Every day | ORAL | 4 refills | Status: DC

## 2017-02-16 MED ORDER — ATENOLOL 50 MG PO TABS
50.0000 mg | ORAL_TABLET | Freq: Every day | ORAL | 4 refills | Status: DC
Start: 2017-02-16 — End: 2018-03-09

## 2017-02-16 NOTE — Progress Notes (Signed)
Pre visit review using our clinic review tool, if applicable. No additional management support is needed unless otherwise documented below in the visit note. 

## 2017-02-16 NOTE — Patient Instructions (Addendum)
We will set you up a consult to see Dr. Sharon Mt to discuss the abnormal CT scan  In addition to the Zocor I would take a baby aspirin daily  Continue current exercise program  Limit your alcohol consumption to 8 ounces daily  The blood in your semen is a benign situation......... if however it becomes worse or you would like it evaluated I would recommend you go back to see your urologist.    Mr. Hao , Thank you for taking time to come for your Medicare Wellness Visit. I appreciate your ongoing commitment to your health goals. Please review the following plan we discussed and let me know if I can assist you in the future.   Bring a copy of your AD or living will  Will schedule your eye visit   Try to alternate high sodium day in your diet but eating fish, chicken or [place];  No deli meat every other day Drinks lots of water     These are the goals we discussed: Goals    . Weight (lb) < 165 lb (74.8 kg)           Mr. Kemppainen , Thank you for taking time to come for your Medicare Wellness Visit. I appreciate your ongoing commitment to your health goals. Please review the following plan we discussed and let me know if I can assist you in the future.   Minimal Blood Pressure Goal= AVERAGE < 140/90; Ideal is an AVERAGE < 135/85. This AVERAGE should be calculated from @ least 5-7 BP readings taken @ different times of day on different days of week. You should not respond to isolated BP readings , but rather the AVERAGE for that week .Please bring your blood pressure cuff to office visits to verify that it is reliable.It can also be checked against the blood pressure device at the pharmacy. Finger or wrist cuffs are not dependable; an arm cuff is.  Also, monitor sodium intake in label of any canned food, as well as sodium in common everyday condiments; ketchup; salad dressing, sauces and any fast food restaurant.    These are the goals we discussed: Goals    . Weight  (lb) < 200 lb (90.7 kg)       This is a list of the screening recommended for you and due dates:  Health Maintenance  Topic Date Due  . Flu Shot  04/22/2017*  . Pneumonia vaccines (2 of 2 - PPSV23) 02/16/2018  . Colon Cancer Screening  09/18/2020  . Tetanus Vaccine  09/20/2024  .  Hepatitis C: One time screening is recommended by Center for Disease Control  (CDC) for  adults born from [birth year] through 1965.   Completed  *Topic was postponed. The date shown is not the original due date.   Prevention of falls: Remove rugs or any tripping hazards in the home Use Non slip mats in bathtubs and showers Placing grab bars next to the toilet and or shower Placing handrails on both sides of the stair way Adding extra lighting in the home.   Personal safety issues reviewed:  1. Consider starting a community watch program per [facility] 2.  Changes batteries is smoke detector and/or carbon monoxide detector  3.  If you have firearms; keep them in a safe place 4.  Wear protection when in the sun; Always wear sunscreen or a hat; It is good to have your doctor check your skin annually or review any new areas of concern  5. Driving safety; Keep in the right lane; stay 3 car lengths behind the car in front of you on the highway; look 3 times prior to pulling out; carry your cell phone everywhere you go!    Learn about the Yellow Dot program:  The program allows first responders at your emergency to have access to who your physician is, as well as your medications and medical conditions.  Citizens requesting the Yellow Dot Packages should contact Master Corporal Nunzio Cobbs at the [facility] [phone] for the first week of the program and beginning the week after Easter citizens should contact their Scientist, physiological.      Health Maintenance, Male A healthy lifestyle and preventive care is important for your health and wellness. Ask your health care  provider about what schedule of regular examinations is right for you. What should I know about weight and diet?  Eat a Healthy Diet  Eat plenty of vegetables, fruits, whole grains, low-fat dairy products, and lean protein.  Do not eat a lot of foods high in solid fats, added sugars, or salt. Maintain a Healthy Weight  Regular exercise can help you achieve or maintain a healthy weight. You should:  Do at least 150 minutes of exercise each week. The exercise should increase your heart rate and make you sweat (moderate-intensity exercise).  Do strength-training exercises at least twice a week. Watch Your Levels of Cholesterol and Blood Lipids  Have your blood tested for lipids and cholesterol every 5 years starting at 69 years of age. If you are at high risk for heart disease, you should start having your blood tested when you are 69 years old. You may need to have your cholesterol levels checked more often if:  Your lipid or cholesterol levels are high.  You are older than 69 years of age.  You are at high risk for heart disease. What should I know about cancer screening? Many types of cancers can be detected early and may often be prevented. Lung Cancer  You should be screened every year for lung cancer if:  You are a current smoker who has smoked for at least 30 years.  You are a former smoker who has quit within the past 15 years.  Talk to your health care provider about your screening options, when you should start screening, and how often you should be screened. Colorectal Cancer  Routine colorectal cancer screening usually begins at 69 years of age and should be repeated every 5-10 years until you are 69 years old. You may need to be screened more often if early forms of precancerous polyps or small growths are found. Your health care provider may recommend screening at an earlier age if you have risk factors for colon cancer.  Your health care provider may recommend using  home test kits to check for hidden blood in the stool.  A small camera at the end of a tube can be used to examine your colon (sigmoidoscopy or colonoscopy). This checks for the earliest forms of colorectal cancer. Prostate and Testicular Cancer  Depending on your age and overall health, your health care provider may do certain tests to screen for prostate and testicular cancer.  Talk to your health care provider about any symptoms or concerns you have about testicular or prostate cancer. Skin Cancer  Check your skin from head to toe regularly.  Tell your health care provider about any new moles or changes in moles, especially if:  There  is a change in a mole's size, shape, or color.  You have a mole that is larger than a pencil eraser.  Always use sunscreen. Apply sunscreen liberally and repeat throughout the day.  Protect yourself by wearing long sleeves, pants, a wide-brimmed hat, and sunglasses when outside. What should I know about heart disease, diabetes, and high blood pressure?  If you are 55-64 years of age, have your blood pressure checked every 3-5 years. If you are 40 years of age or older, have your blood pressure checked every year. You should have your blood pressure measured twice-once when you are at a hospital or clinic, and once when you are not at a hospital or clinic. Record the average of the two measurements. To check your blood pressure when you are not at a hospital or clinic, you can use:  An automated blood pressure machine at a pharmacy.  A home blood pressure monitor.  Talk to your health care provider about your target blood pressure.  If you are between 87-65 years old, ask your health care provider if you should take aspirin to prevent heart disease.  Have regular diabetes screenings by checking your fasting blood sugar level.  If you are at a normal weight and have a low risk for diabetes, have this test once every three years after the age of  41.  If you are overweight and have a high risk for diabetes, consider being tested at a younger age or more often.  A one-time screening for abdominal aortic aneurysm (AAA) by ultrasound is recommended for men aged 81-75 years who are current or former smokers. What should I know about preventing infection? Hepatitis B  If you have a higher risk for hepatitis B, you should be screened for this virus. Talk with your health care provider to find out if you are at risk for hepatitis B infection. Hepatitis C  Blood testing is recommended for:  Everyone born from [birth year] through 1963.  Anyone with known risk factors for hepatitis C. Sexually Transmitted Diseases (STDs)  You should be screened each year for STDs including gonorrhea and chlamydia if:  You are sexually active and are younger than 69 years of age.  You are older than 69 years of age and your health care provider tells you that you are at risk for this type of infection.  Your sexual activity has changed since you were last screened and you are at an increased risk for chlamydia or gonorrhea. Ask your health care provider if you are at risk.  Talk with your health care provider about whether you are at high risk of being infected with HIV. Your health care provider may recommend a prescription medicine to help prevent HIV infection. What else can I do?  Schedule regular health, dental, and eye exams.  Stay current with your vaccines (immunizations).  Do not use any tobacco products, such as cigarettes, chewing tobacco, and e-cigarettes. If you need help quitting, ask your health care provider.  Limit alcohol intake to no more than 2 drinks per day. One drink equals 12 ounces of beer, 5 ounces of wine, or 1 ounces of hard liquor.  Do not use street drugs.  Do not share needles.  Ask your health care provider for help if you need support or information about quitting drugs.  Tell your health care provider if you often  feel depressed.  Tell your health care provider if you have ever been abused or do not feel safe at  home. This information is not intended to replace advice given to you by your health care provider. Make sure you discuss any questions you have with your health care provider. Document Released: 05/07/2008 Document Revised: 07/08/2016 Document Reviewed: 08/13/2015 Elsevier Interactive Patient Education  2017 [place] Prevention in the Home Falls can cause injuries and can affect people from all age groups. There are many simple things that you can do to make your home safe and to help prevent falls. What can I do on the outside of my home?  Regularly repair the edges of walkways and driveways and fix any cracks.  Remove high doorway thresholds.  Trim any shrubbery on the main path into your home.  Use bright outdoor lighting.  Clear walkways of debris and clutter, including tools and rocks.  Regularly check that handrails are securely fastened and in good repair. Both sides of any steps should have handrails.  Install guardrails along the edges of any raised decks or porches.  Have leaves, snow, and ice cleared regularly.  Use sand or salt on walkways during winter months.  In the garage, clean up any spills right away, including grease or oil spills. What can I do in the bathroom?  Use night lights.  Install grab bars by the toilet and in the tub and shower. Do not use towel bars as grab bars.  Use non-skid mats or decals on the floor of the tub or shower.  If you need to sit down while you are in the shower, use a plastic, non-slip stool.  Keep the floor dry. Immediately clean up any water that spills on the floor.  Remove soap buildup in the tub or shower on a regular basis.  Attach bath mats securely with double-sided non-slip rug tape.  Remove throw rugs and other tripping hazards from the floor. What can I do in the bedroom?  Use night  lights.  Make sure that a bedside light is easy to reach.  Do not use oversized bedding that drapes onto the floor.  Have a firm chair that has side arms to use for getting dressed.  Remove throw rugs and other tripping hazards from the floor. What can I do in the kitchen?  Clean up any spills right away.  Avoid walking on wet floors.  Place frequently used items in easy-to-reach places.  If you need to reach for something above you, use a sturdy step stool that has a grab bar.  Keep electrical cables out of the way.  Do not use floor polish or wax that makes floors slippery. If you have to use wax, make sure that it is non-skid floor wax.  Remove throw rugs and other tripping hazards from the floor. What can I do in the stairways?  Do not leave any items on the stairs.  Make sure that there are handrails on both sides of the stairs. Fix handrails that are broken or loose. Make sure that handrails are as long as the stairways.  Check any carpeting to make sure that it is firmly attached to the stairs. Fix any carpet that is loose or worn.  Avoid having throw rugs at the top or bottom of stairways, or secure the rugs with carpet tape to prevent them from moving.  Make sure that you have a light switch at the top of the stairs and the bottom of the stairs. If you do not have them, have them installed. What are some other fall prevention  tips?  Wear closed-toe shoes that fit well and support your feet. Wear shoes that have rubber soles or low heels.  When you use a stepladder, make sure that it is completely opened and that the sides are firmly locked. Have someone hold the ladder while you are using it. Do not climb a closed stepladder.  Add color or contrast paint or tape to grab bars and handrails in your home. Place contrasting color strips on the first and last steps.  Use mobility aids as needed, such as canes, walkers, scooters, and crutches.  Turn on lights if it is  dark. Replace any light bulbs that burn out.  Set up furniture so that there are clear paths. Keep the furniture in the same spot.  Fix any uneven floor surfaces.  Choose a carpet design that does not hide the edge of steps of a stairway.  Be aware of any and all pets.  Review your medicines with your healthcare provider. Some medicines can cause dizziness or changes in blood pressure, which increase your risk of falling. Talk with your health care provider about other ways that you can decrease your risk of falls. This may include working with a physical therapist or trainer to improve your strength, balance, and endurance. This information is not intended to replace advice given to you by your health care provider. Make sure you discuss any questions you have with your health care provider. Document Released: 10/30/2002 Document Revised: 04/07/2016 Document Reviewed: 12/14/2014 Elsevier Interactive Patient Education  2017 [place] DASH stands for "Dietary Approaches to Stop Hypertension." The DASH eating plan is a healthy eating plan that has been shown to reduce high blood pressure (hypertension). It may also reduce your risk for type 2 diabetes, heart disease, and stroke. The DASH eating plan may also help with weight loss. What are tips for following this plan? General guidelines   Avoid eating more than 2,300 mg (milligrams) of salt (sodium) a day. If you have hypertension, you may need to reduce your sodium intake to 1,500 mg a day.  Limit alcohol intake to no more than 1 drink a day for nonpregnant women and 2 drinks a day for men. One drink equals 12 oz of beer, 5 oz of wine, or 1 oz of hard liquor.  Work with your health care provider to maintain a healthy body weight or to lose weight. Ask what an ideal weight is for you.  Get at least 30 minutes of exercise that causes your heart to beat faster (aerobic exercise) most days of the week. Activities may  include walking, swimming, or biking.  Work with your health care provider or diet and nutrition specialist (dietitian) to adjust your eating plan to your individual calorie needs. Reading food labels   Check food labels for the amount of sodium per serving. Choose foods with less than 5 percent of the Daily Value of sodium. Generally, foods with less than 300 mg of sodium per serving fit into this eating plan.  To find whole grains, look for the word "whole" as the first word in the ingredient list. Shopping   Buy products labeled as "low-sodium" or "no salt added."  Buy fresh foods. Avoid canned foods and premade or frozen meals. Cooking   Avoid adding salt when cooking. Use salt-free seasonings or herbs instead of table salt or sea salt. Check with your health care provider or pharmacist before using salt substitutes.  Do not fry foods. Lacinda Axon  foods using healthy methods such as baking, boiling, grilling, and broiling instead.  Cook with heart-healthy oils, such as olive, canola, soybean, or sunflower oil. Meal planning    Eat a balanced diet that includes:  5 or more servings of fruits and vegetables each day. At each meal, try to fill half of your plate with fruits and vegetables.  Up to 6-8 servings of whole grains each day.  Less than 6 oz of lean meat, poultry, or fish each day. A 3-oz serving of meat is about the same size as a deck of cards. One egg equals 1 oz.  2 servings of low-fat dairy each day.  A serving of nuts, seeds, or beans 5 times each week.  Heart-healthy fats. Healthy fats called Omega-3 fatty acids are found in foods such as flaxseeds and coldwater fish, like sardines, salmon, and mackerel.  Limit how much you eat of the following:  Canned or prepackaged foods.  Food that is high in trans fat, such as fried foods.  Food that is high in saturated fat, such as fatty meat.  Sweets, desserts, sugary drinks, and other foods with added sugar.  Full-fat  dairy products.  Do not salt foods before eating.  Try to eat at least 2 vegetarian meals each week.  Eat more home-cooked food and less restaurant, buffet, and fast food.  When eating at a restaurant, ask that your food be prepared with less salt or no salt, if possible. What foods are recommended? The items listed may not be a complete list. Talk with your dietitian about what dietary choices are best for you. Grains  Whole-grain or whole-wheat bread. Whole-grain or whole-wheat pasta. Brown rice. [place]ena Morrow. Bulgur. Whole-grain and low-sodium cereals. Pita bread. Low-fat, low-sodium crackers. Whole-wheat flour tortillas. Vegetables  Fresh or frozen vegetables (raw, steamed, roasted, or grilled). Low-sodium or reduced-sodium tomato and vegetable juice. Low-sodium or reduced-sodium tomato sauce and tomato paste. Low-sodium or reduced-sodium canned vegetables. Fruits  All fresh, dried, or frozen fruit. Canned fruit in natural juice (without added sugar). Meat and other protein foods  Skinless chicken or [place]. Ground chicken or [place]. Pork with fat trimmed off. Fish and seafood. Egg whites. Dried beans, peas, or lentils. Unsalted nuts, nut butters, and seeds. Unsalted canned beans. Lean cuts of beef with fat trimmed off. Low-sodium, lean deli meat. Dairy  Low-fat (1%) or fat-free (skim) milk. Fat-free, low-fat, or reduced-fat cheeses. Nonfat, low-sodium ricotta or cottage cheese. Low-fat or nonfat yogurt. Low-fat, low-sodium cheese. Fats and oils  Soft margarine without trans fats. Vegetable oil. Low-fat, reduced-fat, or light mayonnaise and salad dressings (reduced-sodium). Canola, safflower, olive, soybean, and sunflower oils. Avocado. Seasoning and other foods  Herbs. Spices. Seasoning mixes without salt. Unsalted popcorn and pretzels. Fat-free sweets. What foods are not recommended? The items listed may not be a complete list. Talk with your dietitian about what dietary choices  are best for you. Grains  Baked goods made with fat, such as croissants, muffins, or some breads. Dry pasta or rice meal packs. Vegetables  Creamed or fried vegetables. Vegetables in a cheese sauce. Regular canned vegetables (not low-sodium or reduced-sodium). Regular canned tomato sauce and paste (not low-sodium or reduced-sodium). Regular tomato and vegetable juice (not low-sodium or reduced-sodium). Angie Fava. Olives. Fruits  Canned fruit in a light or heavy syrup. Fried fruit. Fruit in cream or butter sauce. Meat and other protein foods  Fatty cuts of meat. Ribs. Fried meat. Berniece Salines. Sausage. Bologna and other processed lunch meats. Salami. Fatback. Hotdogs.  Bratwurst. Salted nuts and seeds. Canned beans with added salt. Canned or smoked fish. Whole eggs or egg yolks. Chicken or [place] with skin. Dairy  Whole or 2% milk, cream, and half-and-half. Whole or full-fat cream cheese. Whole-fat or sweetened yogurt. Full-fat cheese. Nondairy creamers. Whipped toppings. Processed cheese and cheese spreads. Fats and oils  Butter. Stick margarine. Lard. Shortening. Ghee. Bacon fat. Tropical oils, such as coconut, palm kernel, or palm oil. Seasoning and other foods  Salted popcorn and pretzels. Onion salt, garlic salt, seasoned salt, table salt, and sea salt. Worcestershire sauce. Tartar sauce. Barbecue sauce. Teriyaki sauce. Soy sauce, including reduced-sodium. Steak sauce. Canned and packaged gravies. Fish sauce. Oyster sauce. Cocktail sauce. Horseradish that you find on the shelf. Ketchup. Mustard. Meat flavorings and tenderizers. Bouillon cubes. Hot sauce and Tabasco sauce. Premade or packaged marinades. Premade or packaged taco seasonings. Relishes. Regular salad dressings. Where to find more information:  National Heart, Lung, and [place]: https://wilson-eaton.com/  American Heart Association: www.heart.org Summary  The DASH eating plan is a healthy eating plan that has been shown to reduce high  blood pressure (hypertension). It may also reduce your risk for type 2 diabetes, heart disease, and stroke.  With the DASH eating plan, you should limit salt (sodium) intake to 2,300 mg a day. If you have hypertension, you may need to reduce your sodium intake to 1,500 mg a day.  When on the DASH eating plan, aim to eat more fresh fruits and vegetables, whole grains, lean proteins, low-fat dairy, and heart-healthy fats.  Work with your health care provider or diet and nutrition specialist (dietitian) to adjust your eating plan to your individual calorie needs. This information is not intended to replace advice given to you by your health care provider. Make sure you discuss any questions you have with your health care provider. Document Released: 10/29/2011 Document Revised: 11/02/2016 Document Reviewed: 11/02/2016 Elsevier Interactive Patient Education  2017 [employer].         This is a list of the screening recommended for you and due dates:  Health Maintenance  Topic Date Due  . Flu Shot  04/22/2017*  . Pneumonia vaccines (2 of 2 - PPSV23) 02/16/2018  . Colon Cancer Screening  09/18/2020  . Tetanus Vaccine  09/20/2024  .  Hepatitis C: One time screening is recommended by Center for Disease Control  (CDC) for  adults born from [birth year] through 1963.   Completed  *Topic was postponed. The date shown is not the original due date.

## 2017-02-16 NOTE — Progress Notes (Signed)
Barry Woods is a 71 year old divorced male nonsmoker who comes in today for evaluation of multiple issues  He sees rheumatologist for rheumatoid arthritis. He has some questions about prednisone. I referred him back to his rheumatologist  He takes Tenormin 50 mg daily because of a history of mild hypertension and benign tremor. Blood pressure 120/80 at home. Initially blood pressure elevated here but he said he had a lot of alcohol last night. He admits to 3 glasses of wine.  He takes Zocor 20 mg daily for hyperlipidemia. Advised add a baby aspirin  He saw Tommi Rumps last fall for evaluation of a cough. It didn't clear up. With the pulmonary had a CT scan which showed some inspiratory and expiratory changes etiology unknown but he followed by pulmonary. Also noted in his CT scan was three-vessel coronary calcifications. He's asymptomatic. He runs 2 miles a day and has no chest pain or shortness of breath. He does not run in the winter when he gets below 40. He's concerned about his heart and would like a stress test by Dr. Jearld Lesch who is his sister's cardiologist.  He gets routine eye care, dental care, colonoscopy 2016 normal except for polyps 2.  He saw his dermatologist 2 weeks ago and had thorough skin exam.  He says when he has intercourse his semen will sometimes have some bright red blood in it. It happens every couple months.  His chronic tremor is unchanged. Offered neurologic consult but he declines.  14 point review of systems reviewed and otherwise negative.vs BP 120/80 (BP Location: Left Arm, Patient Position: Sitting, Cuff Size: Normal) Comment: Checked by Wynetta Fines, RN  Temp 98.4 F (36.9 C) (Oral)   Ht 5' 6.5" (1.689 m)   Wt 172 lb (78 kg)   BMI 27.35 kg/m  Examination HEENT shows the right eye to be normal the cataract is mildly dense left is clear. He's aware of this. Neck was supple thyroid not enlarged no carotid bruits cardiopulmonary exam normal abdominal exam normal genitalia normal  circumcised male rectum normal stool guaiac-negative prostate normal to slightly enlarged. Nocturia 2. Extremities normal skin no peripheral pulses normal except for scars from previous lesions removed by his dermatologist 2 weeks ago.  #1 rheumatoid arthritis........ continue followed by rheumatologist  #2 benign tremor and mild hypertension........ continue Tenormin 50 mg daily  #3 hyperlipidemia......... continue 20 mg Zocor at an aspirin  #4 abnormal CT scan........ followed by pulmonology  #5 coronary artery calcifications asymptomatic........ cardiac consult with Dr. Elonda Husky.   #6 concerned about alcohol abuse........ limit alcohol to 2 glasses of wine daily  EKG was done because a history of hypertension and coronary artery calcifications. EKG was normal.

## 2017-02-16 NOTE — Progress Notes (Addendum)
Subjective:   Barry Woods is a 71 y.o. male who presents for Medicare Annual/Subsequent preventive examination.  HRA assessment completed during this visit  The Patient was informed that the wellness visit is to identify future health risk and educate and initiate measures that can reduce risk for increased disease through the lifespan.    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 Sherren Mocha today  Primary Prevention Tobacco never smoked  ETOH- beer or 2 per night or a glass of wine  (for men; recommendation is 2 day with meal )  Sometimes drinks more  Noted issues with BP as well as urinary frequency at hs   Diet  Discussed alternating days with high sodium meals Breakfast cereal with bananas and milk raisin bran crunch Lunch: varies and ham and cheese; pizza Supper; eat baked potato and chicken Always a salad Hamburger Overall very high sodium diet   BMI 27   Exercise  Walk/ run 2 miles; tries to do this 6 days Tries to lift 5 lb weights  Has stopped due to weather;  Has RA as well or knee may bother him   Dental; no  Stressors; no   Safety Fall hx; no  Fear of falling no   Given information on [employer] safety; driving safety, sun protection, firearm safety, smoke detectors as well as the "yellow dot" program for residents in [place].     Do you have little interest or pleasure in doing things?no Have you been feeling down, depressed, hopeless? no PHQ9 waived or completed    Ad8 score reviewed for issues;  Issues making decisions; no  Less interest in hobbies / activities" no  Repeats questions, stories; family complaining: NO  Trouble using ordinary gadgets; microwave; computer: no  Forgets the month or year: no  Mismanaging  finances: no  Missing apt: no but does write them down  Daily problems with thinking of memory NO Ad8 score is 0  MMSE not appropriate unless AD8 score is > 2   Advanced Directive has completed at home  Will bring a copy for the office   There are no preventive care reminders to display for this patient.  Colonoscopy  08/2015 due 08/2020 Cardiac Risk Factors include: advanced age (>76men, >9 women);dyslipidemia;family history of premature cardiovascular disease;hypertension;male gender Will schedule his eye exam    Objective:    Vitals: BP 120/80 (BP Location: Left Arm, Patient Position: Sitting, Cuff Size: Normal) Comment: Checked by Wynetta Fines, RN  Temp 98.4 F (36.9 C) (Oral)   Ht 5' 6.5" (1.689 m)   Wt 172 lb (78 kg)   BMI 27.35 kg/m   Body mass index is 27.35 kg/m.  Tobacco History  Smoking Status  . Never Smoker  Smokeless Tobacco  . Never Used     Counseling given: Yes   Past Medical History:  Diagnosis Date  . Arthritis   . Hyperlipidemia   . Hypertension    Past Surgical History:  Procedure Laterality Date  . COLONOSCOPY    . LIPOMA EXCISION    . PALATE / UVULA BIOPSY / EXCISION    . TONSILLECTOMY     age 55   Family History  Problem Relation Age of Onset  . Colon cancer Mother 41  91  . Heart disease Father    History  Sexual Activity  . Sexual activity: Not on file    Outpatient Encounter Prescriptions as of 02/16/2017  Medication Sig  . Adalimumab (HUMIRA) 40 MG/0.8ML PSKT Inject into the skin. Every other week  . atenolol (TENORMIN) 50 MG tablet Take 1 tablet (50 mg total) by mouth daily.  . predniSONE (DELTASONE) 5 MG tablet Take 5 mg by mouth daily.   . simvastatin (ZOCOR) 20 MG tablet Take 1 tablet (20 mg total) by mouth at bedtime.  . [DISCONTINUED] atenolol (TENORMIN) 50 MG tablet TAKE ONE TABLET BY MOUTH ONCE DAILY  . [DISCONTINUED] simvastatin (ZOCOR) 20 MG tablet TAKE ONE TABLET BY MOUTH AT BEDTIME   No  facility-administered encounter medications on file as of 02/16/2017.     Activities of Daily Living In your present state of health, do you have any difficulty performing the following activities: 02/16/2017  Hearing? N  Vision? N  Difficulty concentrating or making decisions? N  Walking or climbing stairs? N  Dressing or bathing? N  Doing errands, shopping? N  Preparing Food and eating ? N  Using the Toilet? N  In the past six months, have you accidently leaked urine? N  Do you have problems with loss of bowel control? N  Managing your Medications? N  Managing your Finances? N  Housekeeping or managing your Housekeeping? N  Some recent data might be hidden    Patient Care Team: Dorena Cookey, MD as PCP - General   Assessment:     Exercise Activities and Dietary recommendations Current Exercise Habits: Home exercise routine, Type of exercise: walking, Time (Minutes): 40, Frequency (Times/Week): 6, Weekly Exercise (Minutes/Week): 240, Intensity: Moderate  Goals    . Weight (lb) < 165 lb (74.8 kg)           Barry Woods , Thank you for taking time to come for your Medicare Wellness Visit. I appreciate your ongoing commitment to your health goals. Please review the following plan we discussed and let me know if I can assist you in the future.   Minimal Blood Pressure Goal= AVERAGE < 140/90; Ideal is an AVERAGE < 135/85. This AVERAGE should be calculated from @ least 5-7 BP readings taken @ different times of day on different days of week. You should not respond to isolated BP readings , but rather the AVERAGE for that week .Please bring your blood pressure cuff to office visits to verify that it is reliable.It can also be checked against the blood pressure device at the pharmacy. Finger or wrist cuffs are not dependable; an arm cuff is.  Also, monitor sodium intake in label of any canned food, as well as sodium in common everyday condiments; ketchup; salad dressing, sauces and  any fast food restaurant.    These are the goals we discussed: Goals    . Weight (lb) < 200 lb (90.7 kg)       This is a list of the screening recommended for you and due dates:  Health Maintenance  Topic Date Due  . Flu Shot  04/22/2017*  . Pneumonia vaccines (2 of 2 - PPSV23) 02/16/2018  . Colon Cancer Screening  09/18/2020  . Tetanus Vaccine  09/20/2024  .  Hepatitis C: One time screening is recommended by Center for Disease Control  (CDC) for  adults born from [birth year] through 1965.   Completed  *Topic was postponed. The date shown is not the original due date.  Prevention of falls: Remove rugs or any tripping hazards in the home Use Non slip mats in bathtubs and showers Placing grab bars next to the toilet and or shower Placing handrails on both sides of the stair way Adding extra lighting in the home.   Personal safety issues reviewed:  1. Consider starting a community watch program per [facility] 2.  Changes batteries is smoke detector and/or carbon monoxide detector  3.  If you have firearms; keep them in a safe place 4.  Wear protection when in the sun; Always wear sunscreen or a hat; It is good to have your doctor check your skin annually or review any new areas of concern 5. Driving safety; Keep in the right lane; stay 3 car lengths behind the car in front of you on the highway; look 3 times prior to pulling out; carry your cell phone everywhere you go!    Learn about the Yellow Dot program:  The program allows first responders at your emergency to have access to who your physician is, as well as your medications and medical conditions.  Citizens requesting the Yellow Dot Packages should contact Master Corporal Nunzio Cobbs at the [facility] [phone] for the first week of the program and beginning the week after Easter citizens should contact their Scientist, physiological.      Health Maintenance, Male A healthy lifestyle  and preventive care is important for your health and wellness. Ask your health care provider about what schedule of regular examinations is right for you. What should I know about weight and diet?  Eat a Healthy Diet  Eat plenty of vegetables, fruits, whole grains, low-fat dairy products, and lean protein.  Do not eat a lot of foods high in solid fats, added sugars, or salt. Maintain a Healthy Weight  Regular exercise can help you achieve or maintain a healthy weight. You should:  Do at least 150 minutes of exercise each week. The exercise should increase your heart rate and make you sweat (moderate-intensity exercise).  Do strength-training exercises at least twice a week. Watch Your Levels of Cholesterol and Blood Lipids  Have your blood tested for lipids and cholesterol every 5 years starting at 69 years of age. If you are at high risk for heart disease, you should start having your blood tested when you are 69 years old. You may need to have your cholesterol levels checked more often if:  Your lipid or cholesterol levels are high.  You are older than 69 years of age.  You are at high risk for heart disease. What should I know about cancer screening? Many types of cancers can be detected early and may often be prevented. Lung Cancer  You should be screened every year for lung cancer if:  You are a current smoker who has smoked for at least 30 years.  You are a former smoker who has quit within the past 15 years.  Talk to your health care provider about your screening options, when you should start screening, and how often you should be screened. Colorectal Cancer  Routine colorectal cancer screening usually begins at 69 years of age and should be repeated every 5-10 years until you are 69 years old. You may need to be screened more often if early forms of precancerous polyps or small growths are found. Your health care provider may recommend screening at an earlier age if you  have risk factors for colon cancer.  Your health care  provider may recommend using home test kits to check for hidden blood in the stool.  A small camera at the end of a tube can be used to examine your colon (sigmoidoscopy or colonoscopy). This checks for the earliest forms of colorectal cancer. Prostate and Testicular Cancer  Depending on your age and overall health, your health care provider may do certain tests to screen for prostate and testicular cancer.  Talk to your health care provider about any symptoms or concerns you have about testicular or prostate cancer. Skin Cancer  Check your skin from head to toe regularly.  Tell your health care provider about any new moles or changes in moles, especially if:  There is a change in a mole's size, shape, or color.  You have a mole that is larger than a pencil eraser.  Always use sunscreen. Apply sunscreen liberally and repeat throughout the day.  Protect yourself by wearing long sleeves, pants, a wide-brimmed hat, and sunglasses when outside. What should I know about heart disease, diabetes, and high blood pressure?  If you are 71-90 years of age, have your blood pressure checked every 3-5 years. If you are 76 years of age or older, have your blood pressure checked every year. You should have your blood pressure measured twice-once when you are at a hospital or clinic, and once when you are not at a hospital or clinic. Record the average of the two measurements. To check your blood pressure when you are not at a hospital or clinic, you can use:  An automated blood pressure machine at a pharmacy.  A home blood pressure monitor.  Talk to your health care provider about your target blood pressure.  If you are between 78-69 years old, ask your health care provider if you should take aspirin to prevent heart disease.  Have regular diabetes screenings by checking your fasting blood sugar level.  If you are at a normal weight and have  a low risk for diabetes, have this test once every three years after the age of 20.  If you are overweight and have a high risk for diabetes, consider being tested at a younger age or more often.  A one-time screening for abdominal aortic aneurysm (AAA) by ultrasound is recommended for men aged 28-75 years who are current or former smokers. What should I know about preventing infection? Hepatitis B  If you have a higher risk for hepatitis B, you should be screened for this virus. Talk with your health care provider to find out if you are at risk for hepatitis B infection. Hepatitis C  Blood testing is recommended for:  Everyone born from [birth year] through 1963.  Anyone with known risk factors for hepatitis C. Sexually Transmitted Diseases (STDs)  You should be screened each year for STDs including gonorrhea and chlamydia if:  You are sexually active and are younger than 69 years of age.  You are older than 69 years of age and your health care provider tells you that you are at risk for this type of infection.  Your sexual activity has changed since you were last screened and you are at an increased risk for chlamydia or gonorrhea. Ask your health care provider if you are at risk.  Talk with your health care provider about whether you are at high risk of being infected with HIV. Your health care provider may recommend a prescription medicine to help prevent HIV infection. What else can I do?  Schedule regular health, dental, and eye  exams.  Stay current with your vaccines (immunizations).  Do not use any tobacco products, such as cigarettes, chewing tobacco, and e-cigarettes. If you need help quitting, ask your health care provider.  Limit alcohol intake to no more than 2 drinks per day. One drink equals 12 ounces of beer, 5 ounces of wine, or 1 ounces of hard liquor.  Do not use street drugs.  Do not share needles.  Ask your health care provider for help if you need support or  information about quitting drugs.  Tell your health care provider if you often feel depressed.  Tell your health care provider if you have ever been abused or do not feel safe at home. This information is not intended to replace advice given to you by your health care provider. Make sure you discuss any questions you have with your health care provider. Document Released: 05/07/2008 Document Revised: 07/08/2016 Document Reviewed: 08/13/2015 Elsevier Interactive Patient Education  2017 [place] Prevention in the Home Falls can cause injuries and can affect people from all age groups. There are many simple things that you can do to make your home safe and to help prevent falls. What can I do on the outside of my home?  Regularly repair the edges of walkways and driveways and fix any cracks.  Remove high doorway thresholds.  Trim any shrubbery on the main path into your home.  Use bright outdoor lighting.  Clear walkways of debris and clutter, including tools and rocks.  Regularly check that handrails are securely fastened and in good repair. Both sides of any steps should have handrails.  Install guardrails along the edges of any raised decks or porches.  Have leaves, snow, and ice cleared regularly.  Use sand or salt on walkways during winter months.  In the garage, clean up any spills right away, including grease or oil spills. What can I do in the bathroom?  Use night lights.  Install grab bars by the toilet and in the tub and shower. Do not use towel bars as grab bars.  Use non-skid mats or decals on the floor of the tub or shower.  If you need to sit down while you are in the shower, use a plastic, non-slip stool.  Keep the floor dry. Immediately clean up any water that spills on the floor.  Remove soap buildup in the tub or shower on a regular basis.  Attach bath mats securely with double-sided non-slip rug tape.  Remove throw rugs and other  tripping hazards from the floor. What can I do in the bedroom?  Use night lights.  Make sure that a bedside light is easy to reach.  Do not use oversized bedding that drapes onto the floor.  Have a firm chair that has side arms to use for getting dressed.  Remove throw rugs and other tripping hazards from the floor. What can I do in the kitchen?  Clean up any spills right away.  Avoid walking on wet floors.  Place frequently used items in easy-to-reach places.  If you need to reach for something above you, use a sturdy step stool that has a grab bar.  Keep electrical cables out of the way.  Do not use floor polish or wax that makes floors slippery. If you have to use wax, make sure that it is non-skid floor wax.  Remove throw rugs and other tripping hazards from the floor. What can I do in the stairways?  Do not  leave any items on the stairs.  Make sure that there are handrails on both sides of the stairs. Fix handrails that are broken or loose. Make sure that handrails are as long as the stairways.  Check any carpeting to make sure that it is firmly attached to the stairs. Fix any carpet that is loose or worn.  Avoid having throw rugs at the top or bottom of stairways, or secure the rugs with carpet tape to prevent them from moving.  Make sure that you have a light switch at the top of the stairs and the bottom of the stairs. If you do not have them, have them installed. What are some other fall prevention tips?  Wear closed-toe shoes that fit well and support your feet. Wear shoes that have rubber soles or low heels.  When you use a stepladder, make sure that it is completely opened and that the sides are firmly locked. Have someone hold the ladder while you are using it. Do not climb a closed stepladder.  Add color or contrast paint or tape to grab bars and handrails in your home. Place contrasting color strips on the first and last steps.  Use mobility aids as  needed, such as canes, walkers, scooters, and crutches.  Turn on lights if it is dark. Replace any light bulbs that burn out.  Set up furniture so that there are clear paths. Keep the furniture in the same spot.  Fix any uneven floor surfaces.  Choose a carpet design that does not hide the edge of steps of a stairway.  Be aware of any and all pets.  Review your medicines with your healthcare provider. Some medicines can cause dizziness or changes in blood pressure, which increase your risk of falling. Talk with your health care provider about other ways that you can decrease your risk of falls. This may include working with a physical therapist or trainer to improve your strength, balance, and endurance. This information is not intended to replace advice given to you by your health care provider. Make sure you discuss any questions you have with your health care provider. Document Released: 10/30/2002 Document Revised: 04/07/2016 Document Reviewed: 12/14/2014 Elsevier Interactive Patient Education  2017 [place] DASH stands for "Dietary Approaches to Stop Hypertension." The DASH eating plan is a healthy eating plan that has been shown to reduce high blood pressure (hypertension). It may also reduce your risk for type 2 diabetes, heart disease, and stroke. The DASH eating plan may also help with weight loss. What are tips for following this plan? General guidelines   Avoid eating more than 2,300 mg (milligrams) of salt (sodium) a day. If you have hypertension, you may need to reduce your sodium intake to 1,500 mg a day.  Limit alcohol intake to no more than 1 drink a day for nonpregnant women and 2 drinks a day for men. One drink equals 12 oz of beer, 5 oz of wine, or 1 oz of hard liquor.  Work with your health care provider to maintain a healthy body weight or to lose weight. Ask what an ideal weight is for you.  Get at least 30 minutes of exercise that causes  your heart to beat faster (aerobic exercise) most days of the week. Activities may include walking, swimming, or biking.  Work with your health care provider or diet and nutrition specialist (dietitian) to adjust your eating plan to your individual calorie needs. Reading food labels  Check food labels for the amount of sodium per serving. Choose foods with less than 5 percent of the Daily Value of sodium. Generally, foods with less than 300 mg of sodium per serving fit into this eating plan.  To find whole grains, look for the word "whole" as the first word in the ingredient list. Shopping   Buy products labeled as "low-sodium" or "no salt added."  Buy fresh foods. Avoid canned foods and premade or frozen meals. Cooking   Avoid adding salt when cooking. Use salt-free seasonings or herbs instead of table salt or sea salt. Check with your health care provider or pharmacist before using salt substitutes.  Do not fry foods. Cook foods using healthy methods such as baking, boiling, grilling, and broiling instead.  Cook with heart-healthy oils, such as olive, canola, soybean, or sunflower oil. Meal planning    Eat a balanced diet that includes:  5 or more servings of fruits and vegetables each day. At each meal, try to fill half of your plate with fruits and vegetables.  Up to 6-8 servings of whole grains each day.  Less than 6 oz of lean meat, poultry, or fish each day. A 3-oz serving of meat is about the same size as a deck of cards. One egg equals 1 oz.  2 servings of low-fat dairy each day.  A serving of nuts, seeds, or beans 5 times each week.  Heart-healthy fats. Healthy fats called Omega-3 fatty acids are found in foods such as flaxseeds and coldwater fish, like sardines, salmon, and mackerel.  Limit how much you eat of the following:  Canned or prepackaged foods.  Food that is high in trans fat, such as fried foods.  Food that is high in saturated fat, such as fatty  meat.  Sweets, desserts, sugary drinks, and other foods with added sugar.  Full-fat dairy products.  Do not salt foods before eating.  Try to eat at least 2 vegetarian meals each week.  Eat more home-cooked food and less restaurant, buffet, and fast food.  When eating at a restaurant, ask that your food be prepared with less salt or no salt, if possible. What foods are recommended? The items listed may not be a complete list. Talk with your dietitian about what dietary choices are best for you. Grains  Whole-grain or whole-wheat bread. Whole-grain or whole-wheat pasta. Brown rice. [place]. Bulgur. Whole-grain and low-sodium cereals. Pita bread. Low-fat, low-sodium crackers. Whole-wheat flour tortillas. Vegetables  Fresh or frozen vegetables (raw, steamed, roasted, or grilled). Low-sodium or reduced-sodium tomato and vegetable juice. Low-sodium or reduced-sodium tomato sauce and tomato paste. Low-sodium or reduced-sodium canned vegetables. Fruits  All fresh, dried, or frozen fruit. Canned fruit in natural juice (without added sugar). Meat and other protein foods  Skinless chicken or [place]. Ground chicken or [place]. Pork with fat trimmed off. Fish and seafood. Egg whites. Dried beans, peas, or lentils. Unsalted nuts, nut butters, and seeds. Unsalted canned beans. Lean cuts of beef with fat trimmed off. Low-sodium, lean deli meat. Dairy  Low-fat (1%) or fat-free (skim) milk. Fat-free, low-fat, or reduced-fat cheeses. Nonfat, low-sodium ricotta or cottage cheese. Low-fat or nonfat yogurt. Low-fat, low-sodium cheese. Fats and oils  Soft margarine without trans fats. Vegetable oil. Low-fat, reduced-fat, or light mayonnaise and salad dressings (reduced-sodium). Canola, safflower, olive, soybean, and sunflower oils. Avocado. Seasoning and other foods  Herbs. Spices. Seasoning mixes without salt. Unsalted popcorn and pretzels. Fat-free sweets. What foods are not recommended? The items  listed  may not be a complete list. Talk with your dietitian about what dietary choices are best for you. Grains  Baked goods made with fat, such as croissants, muffins, or some breads. Dry pasta or rice meal packs. Vegetables  Creamed or fried vegetables. Vegetables in a cheese sauce. Regular canned vegetables (not low-sodium or reduced-sodium). Regular canned tomato sauce and paste (not low-sodium or reduced-sodium). Regular tomato and vegetable juice (not low-sodium or reduced-sodium). Angie Fava. Olives. Fruits  Canned fruit in a light or heavy syrup. Fried fruit. Fruit in cream or butter sauce. Meat and other protein foods  Fatty cuts of meat. Ribs. Fried meat. Berniece Salines. Sausage. Bologna and other processed lunch meats. Salami. Fatback. Hotdogs. Bratwurst. Salted nuts and seeds. Canned beans with added salt. Canned or smoked fish. Whole eggs or egg yolks. Chicken or [place] with skin. Dairy  Whole or 2% milk, cream, and half-and-half. Whole or full-fat cream cheese. Whole-fat or sweetened yogurt. Full-fat cheese. Nondairy creamers. Whipped toppings. Processed cheese and cheese spreads. Fats and oils  Butter. Stick margarine. Lard. Shortening. Ghee. Bacon fat. Tropical oils, such as coconut, palm kernel, or palm oil. Seasoning and other foods  Salted popcorn and pretzels. Onion salt, garlic salt, seasoned salt, table salt, and sea salt. Worcestershire sauce. Tartar sauce. Barbecue sauce. Teriyaki sauce. Soy sauce, including reduced-sodium. Steak sauce. Canned and packaged gravies. Fish sauce. Oyster sauce. Cocktail sauce. Horseradish that you find on the shelf. Ketchup. Mustard. Meat flavorings and tenderizers. Bouillon cubes. Hot sauce and Tabasco sauce. Premade or packaged marinades. Premade or packaged taco seasonings. Relishes. Regular salad dressings. Where to find more information:  National Heart, Lung, and [place]: https://wilson-eaton.com/  American Heart Association:  www.heart.org Summary  The DASH eating plan is a healthy eating plan that has been shown to reduce high blood pressure (hypertension). It may also reduce your risk for type 2 diabetes, heart disease, and stroke.  With the DASH eating plan, you should limit salt (sodium) intake to 2,300 mg a day. If you have hypertension, you may need to reduce your sodium intake to 1,500 mg a day.  When on the DASH eating plan, aim to eat more fresh fruits and vegetables, whole grains, lean proteins, low-fat dairy, and heart-healthy fats.  Work with your health care provider or diet and nutrition specialist (dietitian) to adjust your eating plan to your individual calorie needs. This information is not intended to replace advice given to you by your health care provider. Make sure you discuss any questions you have with your health care provider. Document Released: 10/29/2011 Document Revised: 11/02/2016 Document Reviewed: 11/02/2016 Elsevier Interactive Patient Education  2017 [place] Risk Fall Risk  02/16/2017 02/16/2017 09/24/2015 09/20/2014  Falls in the past year? No No No No   Depression Screen PHQ 2/9 Scores 02/16/2017 02/16/2017 09/24/2015 09/20/2014  PHQ - 2 Score 0 0 0 0    Cognitive Function MMSE - Mini Mental State Exam 02/16/2017  Not completed: (No Data)   Ad8 score is 0    Immunization History  Administered Date(s) Administered  . Pneumococcal Conjugate-13 02/16/2017  . Td 11/24/2003  . Tdap 09/20/2014  . Zoster 12/03/2011   Screening Tests Health Maintenance  Topic Date Due  . INFLUENZA VACCINE  04/22/2017 (Originally 06/23/2016)  . PNA vac Low Risk Adult (2 of 2 - PPSV23) 02/16/2018  . COLONOSCOPY  09/18/2020  . TETANUS/TDAP  09/20/2024  . Hepatitis C Screening  Completed  Plan:      PCP Notes  Health Maintenance Discussed checking BP; moderate sodium intake to 2500 and 1500 if possible; drinks fluids and limit ETOH intake  To schedule his eye  exam   Abnormal Screens no X BP   Referrals per Dr. Sherren Mocha  Patient concerns; None today; feels he is in good health   Nurse Concerns; cutting back on his sodium emphasized with low sodium days through the week   Next PCP : seen today    During the course of the visit the patient was educated and counseled about the following appropriate screening and preventive services:   Vaccines to include Pneumoccal, Influenza, Hepatitis B, Td, Zostavax, HCV  Electrocardiogram  Cardiovascular Disease  Colorectal cancer screening  Diabetes screening  Prostate Cancer Screening  Glaucoma screening  Nutrition counseling   Smoking cessation counseling  Patient Instructions (the written plan) was given to the patient.    CHENI,DPOEU, RN  02/16/2017  I have reviewed the above history physical and clinical findings and I concur signs Rosine Beat.D.

## 2017-02-17 ENCOUNTER — Other Ambulatory Visit: Payer: Self-pay | Admitting: Family Medicine

## 2017-02-17 DIAGNOSIS — R945 Abnormal results of liver function studies: Secondary | ICD-10-CM

## 2017-02-17 DIAGNOSIS — R7989 Other specified abnormal findings of blood chemistry: Secondary | ICD-10-CM

## 2017-03-10 ENCOUNTER — Other Ambulatory Visit: Payer: Self-pay | Admitting: Family Medicine

## 2017-03-10 DIAGNOSIS — E785 Hyperlipidemia, unspecified: Secondary | ICD-10-CM

## 2017-03-10 NOTE — Telephone Encounter (Signed)
Denied.  Duplicate request.  Filled for 1 year in March.  Message sent to the pharmacy to check file.

## 2017-03-12 ENCOUNTER — Encounter: Payer: Self-pay | Admitting: Cardiovascular Disease

## 2017-03-17 ENCOUNTER — Telehealth: Payer: Self-pay | Admitting: Family Medicine

## 2017-03-17 NOTE — Telephone Encounter (Signed)
Pt due for liver function studies and needs to be placed on the lab schedule.

## 2017-03-30 NOTE — Telephone Encounter (Signed)
lmom for pt to call back

## 2017-04-01 NOTE — Telephone Encounter (Signed)
lmom for pt to call back

## 2017-04-02 NOTE — Telephone Encounter (Signed)
Pt states he does not want to do this lab at this time. Pt has a rheumatology appt in a few months and they will do this liver function test, and he will forward the results to Dr Sherren Mocha.  Pt states he will see Dr Sherren Mocha for his CPE in March 2019.  Pt declined to make cpe appt at this time.

## 2017-04-05 ENCOUNTER — Encounter: Payer: Self-pay | Admitting: Cardiovascular Disease

## 2017-04-05 ENCOUNTER — Ambulatory Visit (INDEPENDENT_AMBULATORY_CARE_PROVIDER_SITE_OTHER): Payer: Medicare Other | Admitting: Cardiovascular Disease

## 2017-04-05 ENCOUNTER — Telehealth (HOSPITAL_COMMUNITY): Payer: Self-pay | Admitting: *Deleted

## 2017-04-05 VITALS — BP 130/82 | HR 76 | Ht 67.0 in | Wt 167.8 lb

## 2017-04-05 DIAGNOSIS — I2510 Atherosclerotic heart disease of native coronary artery without angina pectoris: Secondary | ICD-10-CM

## 2017-04-05 DIAGNOSIS — R5383 Other fatigue: Secondary | ICD-10-CM

## 2017-04-05 NOTE — Progress Notes (Signed)
Chief Complaint  Patient presents with  . New Patient (Initial Visit)    Coronary artery calcification on CT scan   History of Present Illness: 71 yo [place] with history of HTN, HLD and Rheumatoid arthritis here today as a new consult for further evaluation of abnormal CT scan. He is referred to be seen by Dr. Sherren Mocha. He is a very active man. He has no prior cardiac issues. He does walk 2 miles per day and plays golf every week. No chest pain or dyspnea. He has fatigue. No LE edema. He had coronary calcification noted on CT chest.   ROS is otherwise negative.   Primary Care Physician: Dorena Cookey, MD  Past Medical History:  Diagnosis Date  . Arthritis   . Hyperlipidemia   . Hypertension     Past Surgical History:  Procedure Laterality Date  . COLONOSCOPY    . LIPOMA EXCISION    . PALATE / UVULA BIOPSY / EXCISION    . TONSILLECTOMY     age 57    Current Outpatient Prescriptions  Medication Sig Dispense Refill  . Adalimumab (HUMIRA) 40 MG/0.8ML PSKT Inject into the skin. Every other week    . atenolol (TENORMIN) 50 MG tablet Take 1 tablet (50 mg total) by mouth daily. 90 tablet 4  . predniSONE (DELTASONE) 5 MG tablet Take 5 mg by mouth every other day.     . simvastatin (ZOCOR) 20 MG tablet Take 1 tablet (20 mg total) by mouth at bedtime. 100 tablet 4   No current facility-administered medications for this visit.     Allergies  Allergen Reactions  . Atorvastatin     REACTION: hives  . Cetirizine Hcl     REACTION: hives    Social History   Social History  . Marital status: Divorced    Spouse name: N/A  . Number of children: N/A  . Years of education: N/A   Occupational History  . Retired-[employer]    Social History Main Topics  . Smoking status: Never Smoker  . Smokeless tobacco: Never Used  . Alcohol use 8.4 oz/week    14 Cans of beer per week  . Drug use: No  . Sexual activity: Not on file   Other Topics Concern  . Not on file   Social History  Narrative  . No narrative on file    Family History  Problem Relation Age of Onset  . Colon cancer Mother 80       57  . Hepatitis C Father   . CAD Father     Review of Systems:  As stated in the HPI and otherwise negative.   BP 130/82   Pulse 76   Ht 5\' 7"  (1.702 m)   Wt 167 lb 12.8 oz (76.1 kg)   SpO2 94%   BMI 26.28 kg/m   Physical Examination: General: Well developed, well nourished, NAD  HEENT: OP clear, mucus membranes moist  SKIN: warm, dry. No rashes. Neuro: No focal deficits  Musculoskeletal: Muscle strength 5/5 all ext  Psychiatric: Mood and affect normal  Neck: No JVD, no carotid bruits, no thyromegaly, no lymphadenopathy.  Lungs:Clear bilaterally, no wheezes, rhonci, crackles Cardiovascular: Regular rate and rhythm. No murmurs, gallops or rubs. Abdomen:Soft. Bowel sounds present. Non-tender.  Extremities: No lower extremity edema. Pulses are 2 + in the bilateral DP/PT.  CT chest November 2017: Cardiovascular: Atherosclerotic calcification of the arterial vasculature, including three-vessel involvement of the coronary arteries. Heart size within normal  limits. No pericardial effusion.  Mediastinum/Nodes: Mediastinal lymph nodes are not enlarged by CT size criteria. Hilar regions are difficult to definitively evaluate without IV contrast. No axillary adenopathy. Esophagus is grossly unremarkable. Tiny hiatal hernia.  Lungs/Pleura: Peripheral and basilar predominant subpleural ground-glass, reticulation and mild bronchiolectasis, unchanged from 09/04/2016 and new from 06/13/2010. No air trapping. No pleural fluid. Airway is unremarkable.  Upper Abdomen: Visualized portion of the liver is decreased in attenuation diffusely. Visualized portions of the adrenal glands, kidneys, spleen and pancreas are grossly unremarkable. Tiny hiatal hernia. No upper abdominal adenopathy.  Musculoskeletal: No worrisome lytic or sclerotic lesions.  IMPRESSION: 1.  Pulmonary parenchymal pattern of mid/lower lung zones subpleural reticulation, ground-glass and mild bronchiolectasis, unchanged from 09/04/2016 and new from 06/13/2010. Findings are highly suspicious for interstitial lung disease such as nonspecific interstitial pneumonitis or usual interstitial pneumonitis. Consider follow-up high-resolution chest CT in 1 year, as clinically indicated. 2. Aortic atherosclerosis (ICD10-170.0). Three-vessel coronary artery calcification. 3. Fatty liver.  EKG:  EKG is not ordered today. I have personally reviewed the EKG from March 2018 and this demonstrates NSR, no ischemic changes  Recent Labs: 02/16/2017: ALT 57; BUN 10; Creatinine, Ser 0.93; Hemoglobin 15.4; Platelets 169.0; Potassium 3.7; Sodium 136; TSH 1.83   Lipid Panel    Component Value Date/Time   CHOL 210 (H) 02/16/2017 1000   TRIG 119.0 02/16/2017 1000   TRIG 150 (H) 09/28/2006 0957   HDL 81.50 02/16/2017 1000   CHOLHDL 3 02/16/2017 1000   VLDL 23.8 02/16/2017 1000   LDLCALC 105 (H) 02/16/2017 1000   LDLDIRECT 126.5 01/18/2013 0807     Wt Readings from Last 3 Encounters:  04/05/17 167 lb 12.8 oz (76.1 kg)  02/16/17 172 lb (78 kg)  10/21/16 170 lb 9.6 oz (77.4 kg)     Other studies Reviewed: Additional studies/ records that were reviewed today include: . Review of the above records demonstrates:   Assessment and Plan:   1. CAD without angina: Coronary calcification noted on CT chest. He has no chest pain or dyspnea but he does have fatigue. Will arrange Lexiscan stress myoview to exclude ischemia. He does not think he can walk on a treadmill.   2. Fatigue: As above, will arrange stress testing.   Current medicines are reviewed at length with the patient today.  The patient does not have concerns regarding medicines.  The following changes have been made:  no change  Labs/ tests ordered today include:   Orders Placed This Encounter  Procedures  . Myocardial Perfusion  Imaging   Disposition:   FU with me in 6 weeks  Signed, Lauree Chandler, MD 04/05/2017 1:Barry PM    [facility] [place], [place], [place]  [address] Phone: [phone]; Fax: [phone]

## 2017-04-05 NOTE — Patient Instructions (Signed)
Medication Instructions:  Your physician recommends that you continue on your current medications as directed. Please refer to the Current Medication list given to you today.   Labwork: none  Testing/Procedures: Your physician has requested that you have a lexiscan myoview. For further information please visit [email or web address]. Please follow instruction sheet, as given.    Follow-Up: Your physician recommends that you schedule a follow-up appointment in: about 6 weeks.  Scheduled for June 29,2018 at 9:40    Any Other Special Instructions Will Be Listed Below (If Applicable).     If you need a refill on your cardiac medications before your next appointment, please call your pharmacy.

## 2017-04-05 NOTE — Telephone Encounter (Signed)
Left message on voicemail per DPR in reference to upcoming appointment scheduled on 04/08/17 at 0730 with detailed instructions given per Myocardial Perfusion Study Information Sheet for the test. LM to arrive 15 minutes early, and that it is imperative to arrive on time for appointment to keep from having the test rescheduled. If you need to cancel or reschedule your appointment, please call the office within 24 hours of your appointment. Failure to do so may result in a cancellation of your appointment, and a $50 no show fee. Phone number given for call back for any questions.

## 2017-04-08 ENCOUNTER — Ambulatory Visit (HOSPITAL_COMMUNITY): Payer: Medicare Other | Attending: Cardiovascular Disease

## 2017-04-08 DIAGNOSIS — I2510 Atherosclerotic heart disease of native coronary artery without angina pectoris: Secondary | ICD-10-CM | POA: Diagnosis not present

## 2017-04-08 DIAGNOSIS — R5383 Other fatigue: Secondary | ICD-10-CM | POA: Insufficient documentation

## 2017-04-08 LAB — MYOCARDIAL PERFUSION IMAGING
CHL CUP NUCLEAR SDS: 1
CHL CUP NUCLEAR SRS: 0
CHL CUP NUCLEAR SSS: 1
CSEPPHR: 97 {beats}/min
LHR: 0.37
LV dias vol: 96 mL (ref 62–150)
LV sys vol: 28 mL
Rest HR: 62 {beats}/min
TID: 0.92

## 2017-04-08 MED ORDER — TECHNETIUM TC 99M TETROFOSMIN IV KIT
32.4000 | PACK | Freq: Once | INTRAVENOUS | Status: AC | PRN
  Administered 2017-04-08: 32.4 via INTRAVENOUS
  Filled 2017-04-08: qty 33

## 2017-04-08 MED ORDER — REGADENOSON 0.4 MG/5ML IV SOLN
0.4000 mg | Freq: Once | INTRAVENOUS | Status: AC
  Administered 2017-04-08: 0.4 mg via INTRAVENOUS

## 2017-04-08 MED ORDER — TECHNETIUM TC 99M TETROFOSMIN IV KIT
10.3000 | PACK | Freq: Once | INTRAVENOUS | Status: AC | PRN
  Administered 2017-04-08: 10.3 via INTRAVENOUS
  Filled 2017-04-08: qty 11

## 2017-04-09 ENCOUNTER — Telehealth: Payer: Self-pay | Admitting: Cardiovascular Disease

## 2017-04-09 NOTE — Telephone Encounter (Signed)
Pt made aware of Myoview results. Patient verbalized understanding

## 2017-04-09 NOTE — Telephone Encounter (Signed)
New message ° ° ° °Pt is returning call about results. °

## 2017-04-20 ENCOUNTER — Telehealth: Payer: Self-pay | Admitting: Pulmonary Disease

## 2017-04-20 ENCOUNTER — Ambulatory Visit (INDEPENDENT_AMBULATORY_CARE_PROVIDER_SITE_OTHER)
Admission: RE | Admit: 2017-04-20 | Discharge: 2017-04-20 | Disposition: A | Discharge status: Home / Self Care | Payer: Medicare Other | Source: Ambulatory Visit | Attending: Pulmonary Disease | Admitting: Pulmonary Disease

## 2017-04-20 DIAGNOSIS — J849 Interstitial pulmonary disease, unspecified: Secondary | ICD-10-CM

## 2017-04-20 NOTE — Telephone Encounter (Signed)
   Chest HRCT done today d/w pt over the phone.  Similar findings as 09/2016 but with some progression.  Pt is very conservative with Rx.  He has RA and sees rheum 2x/year. He is very functional, no complaints.  He is on pred for RA as he did not tolerate biologicals, He wanted to have a f/u in 55yr with Pulmonary and decide on HRCT scan then.  I recommended f/u with [place] in 6 months but he said 1 year.  Told him to call sooner if more sx.    Jasmine : can we make a f/u with [place] in 1 yr? Thanks.    Monica Becton, MD 04/20/2017, 5:39 PM [facility] Pager [phone] After 3 pm or if no answer, call [phone]

## 2017-04-21 NOTE — Telephone Encounter (Signed)
1 year follow up recall has been placed. Nothing further is needed

## 2017-05-06 ENCOUNTER — Encounter: Payer: Self-pay | Admitting: Cardiovascular Disease

## 2017-05-20 ENCOUNTER — Encounter: Payer: Self-pay | Admitting: Cardiovascular Disease

## 2017-05-20 NOTE — Progress Notes (Signed)
Chief Complaint  Patient presents with  . Follow-up    CAD   History of Present Illness: 71 yo male with history of HTN, HLD and rheumatoid arthritis here today for cardiac follow up. I saw him as a new consult for evaluation of abnormal CT scan on 04/05/17. CT chest showed coronary calcification. He had no complaints of chest pain or dyspnea. He is very active. I arranged a nuclear stress test on 04/08/17 which was normal and showed no ischemia. LV function was normal.    He is here today for follow up. The patient denies any chest pain, dyspnea, palpitations, lower extremity edema, orthopnea, PND, dizziness, near syncope or syncope.   Primary Care Physician: Dorena Cookey, MD  Past Medical History:  Diagnosis Date  . Arthritis   . Hyperlipidemia   . Hypertension   . Rheumatoid arthritis [facility])     Past Surgical History:  Procedure Laterality Date  . COLONOSCOPY    . LIPOMA EXCISION    . PALATE / UVULA BIOPSY / EXCISION    . TONSILLECTOMY     age 27    Current Outpatient Prescriptions  Medication Sig Dispense Refill  . Adalimumab (HUMIRA) 40 MG/0.8ML PSKT Inject into the skin. Every other week    . atenolol (TENORMIN) 50 MG tablet Take 1 tablet (50 mg total) by mouth daily. 90 tablet 4  . simvastatin (ZOCOR) 20 MG tablet Take 1 tablet (20 mg total) by mouth at bedtime. 100 tablet 4   No current facility-administered medications for this visit.     Allergies  Allergen Reactions  . Atorvastatin     REACTION: hives  . Cetirizine Hcl     REACTION: hives    Social History   Social History  . Marital status: Divorced    Spouse name: N/A  . Number of children: N/A  . Years of education: N/A   Occupational History  . Retired-[employer]    Social History Main Topics  . Smoking status: Never Smoker  . Smokeless tobacco: Never Used  . Alcohol use 8.4 oz/week    14 Cans of beer per week  . Drug use: No  . Sexual activity: Not on file   Other Topics Concern    . Not on file   Social History Narrative  . No narrative on file    Family History  Problem Relation Age of Onset  . Colon cancer Mother 63       57  . Hepatitis C Father   . CAD Father     Review of Systems:  As stated in the HPI and otherwise negative.   BP 100/64   Pulse 75   Ht 5\' 7"  (1.702 m)   Wt 165 lb (74.8 kg)   SpO2 94%   BMI 25.84 kg/m   Physical Examination: General: Well developed, well nourished, NAD  HEENT: OP clear, mucus membranes moist  SKIN: warm, dry. No rashes. Neuro: No focal deficits  Musculoskeletal: Muscle strength 5/5 all ext  Psychiatric: Mood and affect normal  Neck: No JVD, no carotid bruits, no thyromegaly, no lymphadenopathy.  Lungs:Clear bilaterally, no wheezes, rhonci, crackles Cardiovascular: Regular rate and rhythm. No murmurs, gallops or rubs. Abdomen:Soft. Bowel sounds present. Non-tender.  Extremities: No lower extremity edema. Pulses are 2 + in the bilateral DP/PT.   CT chest November 2017: Cardiovascular: Atherosclerotic calcification of the arterial vasculature, including three-vessel involvement of the coronary arteries. Heart size within normal limits. No pericardial effusion.  Mediastinum/Nodes: Mediastinal lymph nodes are not enlarged by CT size criteria. Hilar regions are difficult to definitively evaluate without IV contrast. No axillary adenopathy. Esophagus is grossly unremarkable. Tiny hiatal hernia.  Lungs/Pleura: Peripheral and basilar predominant subpleural ground-glass, reticulation and mild bronchiolectasis, unchanged from 09/04/2016 and new from 06/13/2010. No air trapping. No pleural fluid. Airway is unremarkable.  Upper Abdomen: Visualized portion of the liver is decreased in attenuation diffusely. Visualized portions of the adrenal glands, kidneys, spleen and pancreas are grossly unremarkable. Tiny hiatal hernia. No upper abdominal adenopathy.  Musculoskeletal: No worrisome lytic or sclerotic  lesions.  IMPRESSION: 1. Pulmonary parenchymal pattern of mid/lower lung zones subpleural reticulation, ground-glass and mild bronchiolectasis, unchanged from 09/04/2016 and new from 06/13/2010. Findings are highly suspicious for interstitial lung disease such as nonspecific interstitial pneumonitis or usual interstitial pneumonitis. Consider follow-up high-resolution chest CT in 1 year, as clinically indicated. 2. Aortic atherosclerosis (ICD10-170.0). Three-vessel coronary artery calcification. 3. Fatty liver.  Nuclear stress May 2018:  Nuclear stress EF: 71%.  There was no ST segment deviation noted during stress.  The study is normal.  This is a low risk study.  The left ventricular ejection fraction is hyperdynamic (>65%).   Normal pharmacologic nuclear study with no evidence of prior infarct or ischemia.  EKG:  EKG is not ordered today.  The EKG today demonstrates   Recent Labs: 02/16/2017: ALT 57; BUN 10; Creatinine, Ser 0.93; Hemoglobin 15.4; Platelets 169.0; Potassium 3.7; Sodium 136; TSH 1.83   Lipid Panel    Component Value Date/Time   CHOL 210 (H) 02/16/2017 1000   TRIG 119.0 02/16/2017 1000   TRIG 150 (H) 09/28/2006 0957   HDL 81.50 02/16/2017 1000   CHOLHDL 3 02/16/2017 1000   VLDL 23.8 02/16/2017 1000   LDLCALC 105 (H) 02/16/2017 1000   LDLDIRECT 126.5 01/18/2013 0807     Wt Readings from Last 3 Encounters:  05/21/17 165 lb (74.8 kg)  04/08/17 168 lb (76.2 kg)  04/05/17 167 lb 12.8 oz (76.1 kg)     Other studies Reviewed: Additional studies/ records that were reviewed today include: . Review of the above records demonstrates:   Assessment and Plan:   1. CAD without angina: He has coronary calcification noted on CT chest. Nuclear stress test May 2018 with no ischemia. He is having no chest pain or dyspnea worrisome for angina. No further ischemic workup at this time.    Current medicines are reviewed at length with the patient today.  The  patient does not have concerns regarding medicines.  The following changes have been made:  no change  Labs/ tests ordered today include:   No orders of the defined types were placed in this encounter.  Disposition:   FU with me in 12 months.   Signed, Lauree Chandler, MD 05/21/2017 9:59 AM    [facility] [place], [place], [place]  [address] Phone: [phone]; Fax: [phone]

## 2017-05-21 ENCOUNTER — Encounter: Payer: Self-pay | Admitting: Cardiovascular Disease

## 2017-05-21 ENCOUNTER — Ambulatory Visit (INDEPENDENT_AMBULATORY_CARE_PROVIDER_SITE_OTHER): Payer: Medicare Other | Admitting: Cardiovascular Disease

## 2017-05-21 VITALS — BP 100/64 | HR 75 | Ht 67.0 in | Wt 165.0 lb

## 2017-05-21 DIAGNOSIS — I2510 Atherosclerotic heart disease of native coronary artery without angina pectoris: Secondary | ICD-10-CM

## 2017-05-21 MED ORDER — ASPIRIN EC 81 MG PO TBEC: 81.0000 mg | DELAYED_RELEASE_TABLET | Freq: Every day | ORAL | 3 refills | Status: DC

## 2017-05-21 NOTE — Addendum Note (Signed)
Addended by: Thompson Grayer on: 05/21/2017 10:05 AM   Modules accepted: Orders

## 2017-05-21 NOTE — Patient Instructions (Signed)
Medication Instructions:  Your physician has recommended you make the following change in your medication: Start aspirin 81 mg by mouth daily.     Labwork: none  Testing/Procedures: none  Follow-Up: Your physician recommends that you schedule a follow-up appointment in: 12 months. Please call our office in about 9 months to schedule this appointment   Any Other Special Instructions Will Be Listed Below (If Applicable).     If you need a refill on your cardiac medications before your next appointment, please call your pharmacy.   

## 2017-07-29 DIAGNOSIS — Z6824 Body mass index (BMI) 24.0-24.9, adult: Secondary | ICD-10-CM | POA: Diagnosis not present

## 2017-07-29 DIAGNOSIS — R945 Abnormal results of liver function studies: Secondary | ICD-10-CM | POA: Diagnosis not present

## 2017-07-29 DIAGNOSIS — M0589 Other rheumatoid arthritis with rheumatoid factor of multiple sites: Secondary | ICD-10-CM | POA: Diagnosis not present

## 2017-08-13 ENCOUNTER — Encounter: Payer: Self-pay | Admitting: Family Medicine

## 2017-10-27 DIAGNOSIS — R945 Abnormal results of liver function studies: Secondary | ICD-10-CM | POA: Diagnosis not present

## 2017-10-27 DIAGNOSIS — M0589 Other rheumatoid arthritis with rheumatoid factor of multiple sites: Secondary | ICD-10-CM | POA: Diagnosis not present

## 2018-01-13 DIAGNOSIS — R69 Illness, unspecified: Secondary | ICD-10-CM | POA: Diagnosis not present

## 2018-02-01 DIAGNOSIS — R945 Abnormal results of liver function studies: Secondary | ICD-10-CM | POA: Diagnosis not present

## 2018-02-01 DIAGNOSIS — M0589 Other rheumatoid arthritis with rheumatoid factor of multiple sites: Secondary | ICD-10-CM | POA: Diagnosis not present

## 2018-02-01 DIAGNOSIS — Z6824 Body mass index (BMI) 24.0-24.9, adult: Secondary | ICD-10-CM | POA: Diagnosis not present

## 2018-02-14 IMAGING — CT CT CHEST HIGH RESOLUTION W/O CM
2 of 6 series · 15 of 36 positions shown, 18 images · non-contrast
Comparison: 10/19/2016 high-resolution chest CT.

CLINICAL DATA: Follow-up interstitial lung disease.

EXAM:
CT CHEST WITHOUT CONTRAST
TECHNIQUE: Multidetector CT imaging of the chest was performed following the
standard protocol without intravenous contrast. High resolution
imaging of the lungs, as well as inspiratory and expiratory imaging,
was performed.

[Series 2: high resolution · axial · 0.65mm/px · z∈[-339,-59]mm · 12 of 158 slices shown, 15 images]
[im 9/158  mediastinal]
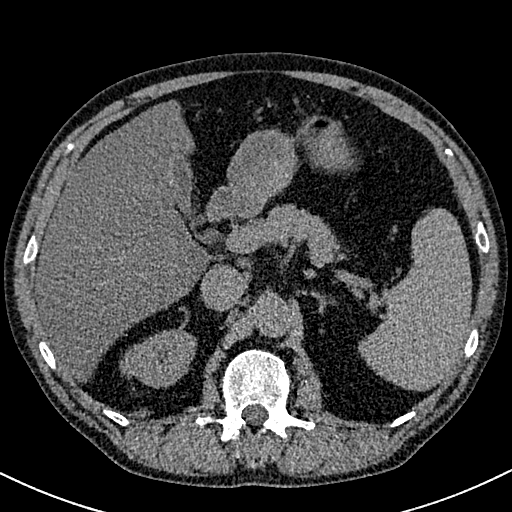
[im 9/158  lung]
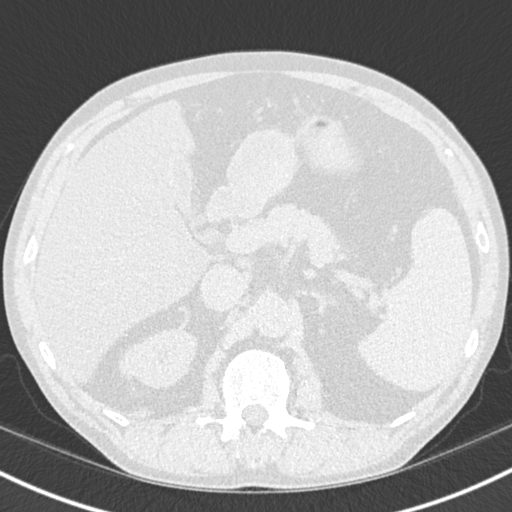
[im 25/158  lung]
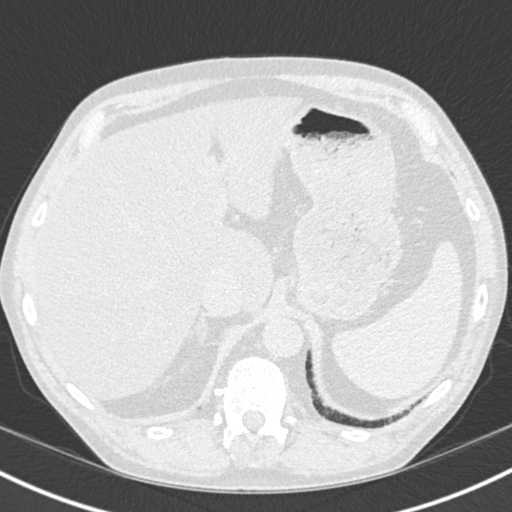
[im 34/158  lung]
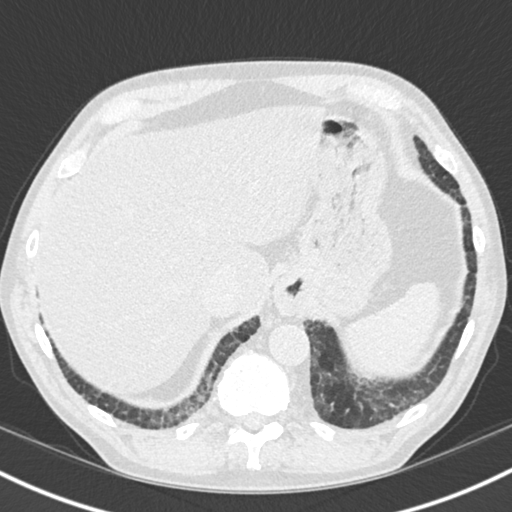
[im 50/158  lung]
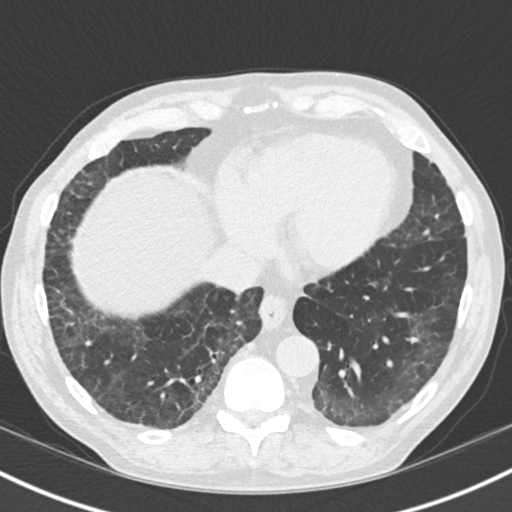
[im 58/158  mediastinal]
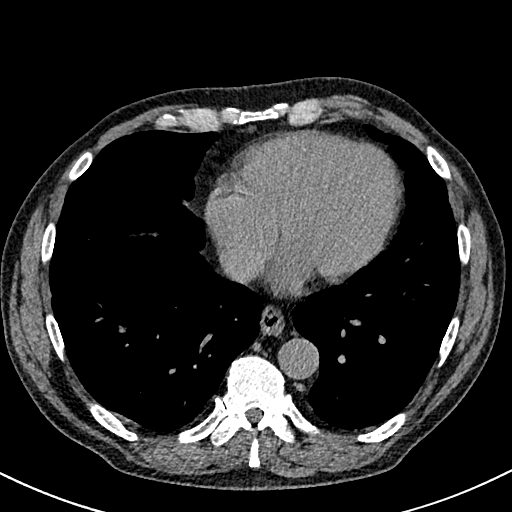
[im 58/158  lung]
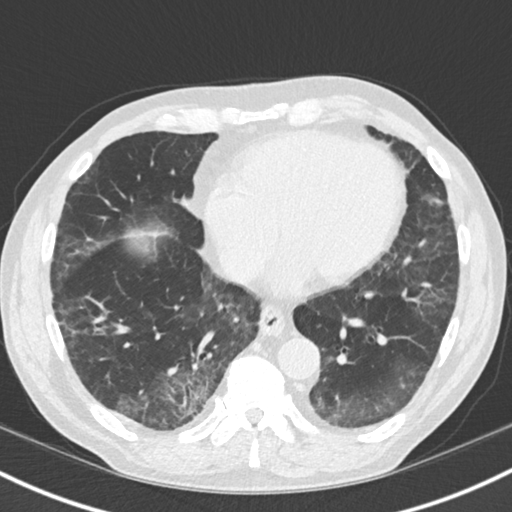
[im 75/158  lung]
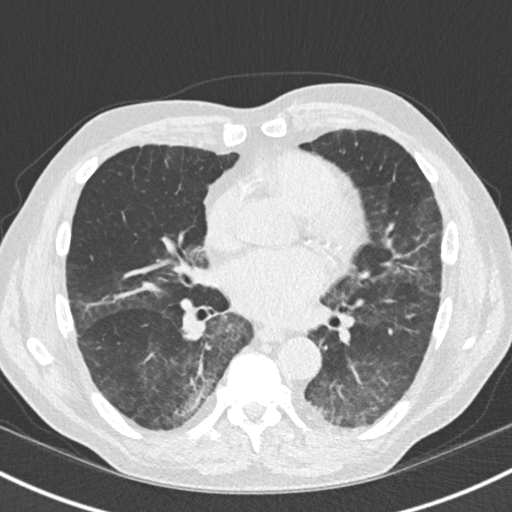
[im 83/158  lung]
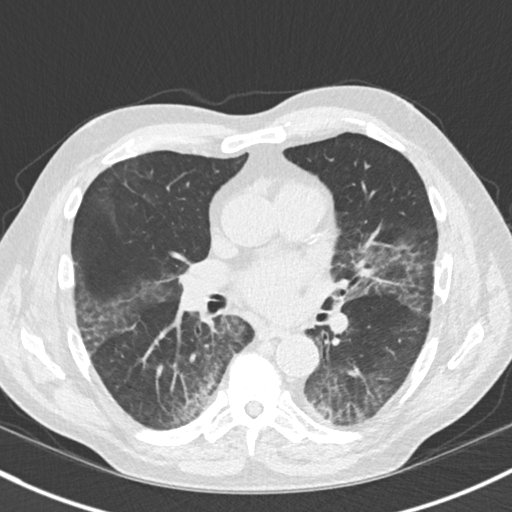
[im 100/158  lung]
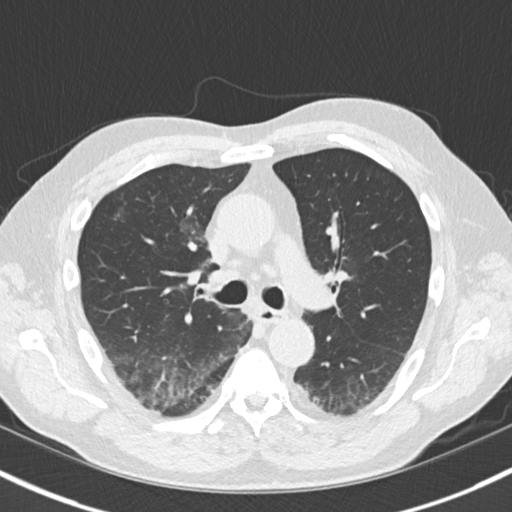
[im 108/158  mediastinal]
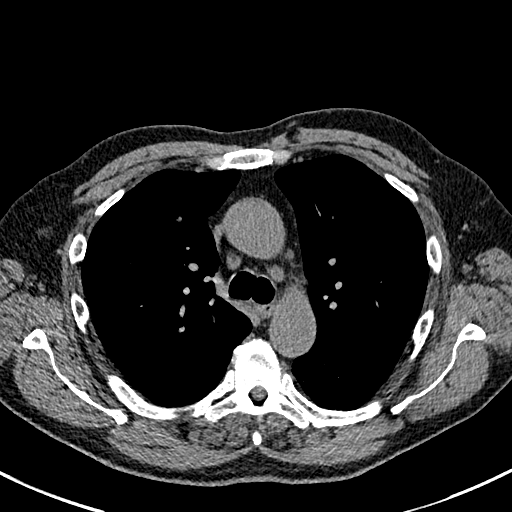
[im 108/158  lung]
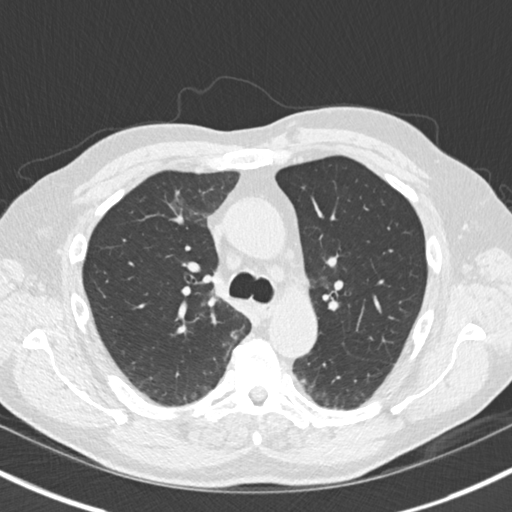
[im 124/158  lung]
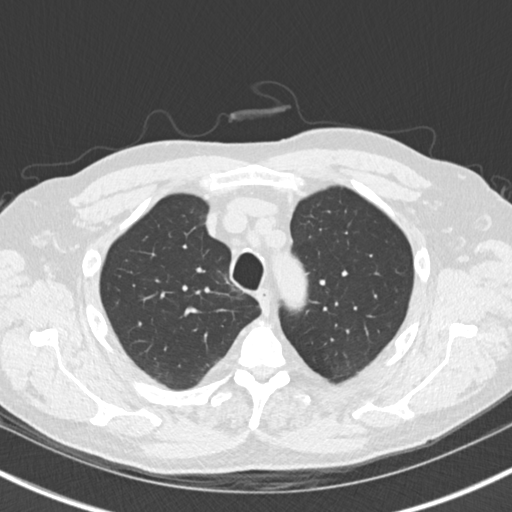
[im 133/158  lung]
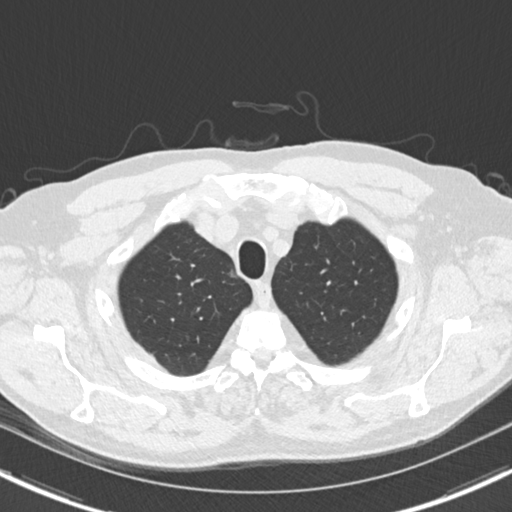
[im 149/158  lung]
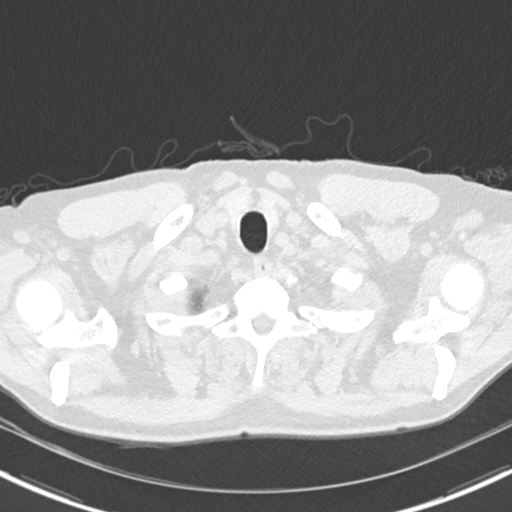

[Series 8: coronal · coronal · 0.65mm/px · 3 of 118 slices shown]
[im 24/118  lung]
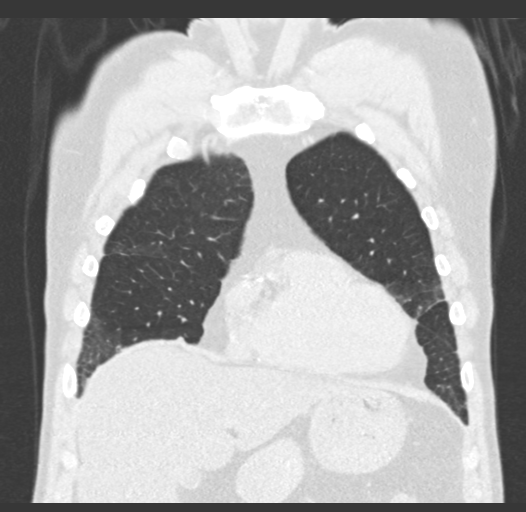
[im 47/118  lung]
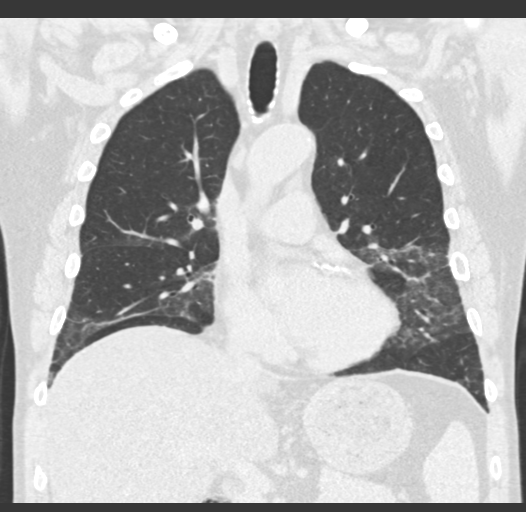
[im 71/118  lung]
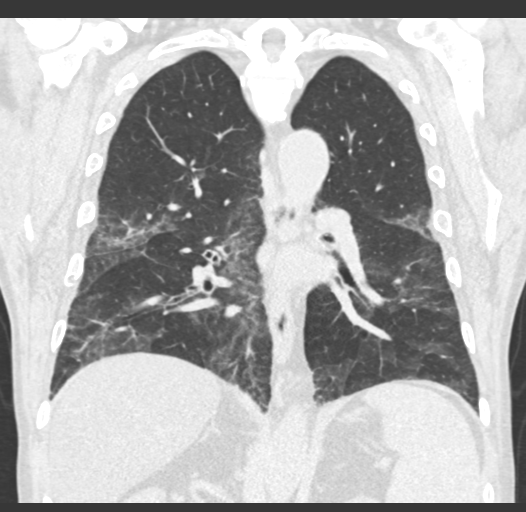

[15 of 36 positions shown; findings below may reference images not displayed]

FINDINGS: Cardiovascular: Normal heart size. No significant pericardial
fluid/thickening. Left anterior descending and right coronary
atherosclerosis. Atherosclerotic nonaneurysmal thoracic aorta.
Normal caliber pulmonary arteries.

Mediastinum/Nodes: No discrete thyroid nodules. Unremarkable
esophagus. No axillary adenopathy. Stable mildly enlarged 1.2 cm
subcarinal node (series 2/ image 64). No additional pathologically
enlarged mediastinal or gross hilar nodes on this noncontrast scan.

Lungs/Pleura: No pneumothorax. No pleural effusion. No acute
consolidative airspace disease, lung masses or significant pulmonary
nodules. No significant air trapping on the expiration sequence.
There is extensive patchy ground-glass attenuation and subpleural
reticulation throughout both lungs with a dependent basilar
predominance. These findings have progressed since 10/19/2016 chest
CT. There is mild associated traction bronchiolectasis, most
prominent medial lower lobes. No frank honeycombing.

Upper abdomen: Small hiatal hernia. Diffuse hepatic steatosis.

Musculoskeletal: No aggressive appearing focal osseous lesions. Mild
thoracic spondylosis.
IMPRESSION: 1. Interval progression of dependent basilar predominant
interstitial lung disease with patchy ground-glass attenuation,
subpleural reticulation and mild traction bronchiolectasis. The
progression indicates probable usual interstitial pneumonia (UIP)
although there is no frank honeycombing at this time. Continued
high-resolution chest CT follow-up recommended in 12 months.
2. Stable mild mediastinal lymphadenopathy, compatible with benign
reactive adenopathy.
3. Additional findings include aortic atherosclerosis, 2 vessel
coronary atherosclerosis, small hiatal hernia and diffuse hepatic
steatosis.

## 2018-02-22 ENCOUNTER — Encounter: Payer: Medicare Other | Admitting: Family Medicine

## 2018-03-09 ENCOUNTER — Ambulatory Visit (INDEPENDENT_AMBULATORY_CARE_PROVIDER_SITE_OTHER): Payer: Medicare HMO | Admitting: Family Medicine

## 2018-03-09 ENCOUNTER — Encounter: Payer: Self-pay | Admitting: Family Medicine

## 2018-03-09 VITALS — BP 130/80 | HR 68 | Temp 98.0°F | Ht 67.0 in | Wt 156.0 lb

## 2018-03-09 DIAGNOSIS — Z125 Encounter for screening for malignant neoplasm of prostate: Secondary | ICD-10-CM | POA: Diagnosis not present

## 2018-03-09 DIAGNOSIS — Z23 Encounter for immunization: Secondary | ICD-10-CM | POA: Diagnosis not present

## 2018-03-09 DIAGNOSIS — I4892 Unspecified atrial flutter: Secondary | ICD-10-CM | POA: Insufficient documentation

## 2018-03-09 DIAGNOSIS — M79672 Pain in left foot: Secondary | ICD-10-CM | POA: Diagnosis not present

## 2018-03-09 DIAGNOSIS — R351 Nocturia: Secondary | ICD-10-CM

## 2018-03-09 DIAGNOSIS — Z0000 Encounter for general adult medical examination without abnormal findings: Secondary | ICD-10-CM | POA: Insufficient documentation

## 2018-03-09 DIAGNOSIS — N401 Enlarged prostate with lower urinary tract symptoms: Secondary | ICD-10-CM

## 2018-03-09 DIAGNOSIS — I10 Essential (primary) hypertension: Secondary | ICD-10-CM

## 2018-03-09 DIAGNOSIS — E7800 Pure hypercholesterolemia, unspecified: Secondary | ICD-10-CM | POA: Diagnosis not present

## 2018-03-09 DIAGNOSIS — M79671 Pain in right foot: Secondary | ICD-10-CM | POA: Insufficient documentation

## 2018-03-09 LAB — CBC WITH DIFFERENTIAL/PLATELET
BASOS PCT: 1.1 % (ref 0.0–3.0)
Basophils Absolute: 0.1 10*3/uL (ref 0.0–0.1)
EOS PCT: 3.7 % (ref 0.0–5.0)
Eosinophils Absolute: 0.2 10*3/uL (ref 0.0–0.7)
HCT: 46.9 % (ref 39.0–52.0)
Hemoglobin: 16.4 g/dL (ref 13.0–17.0)
LYMPHS ABS: 1.4 10*3/uL (ref 0.7–4.0)
Lymphocytes Relative: 31.4 % (ref 12.0–46.0)
MCHC: 34.9 g/dL (ref 30.0–36.0)
MCV: 91 fl (ref 78.0–100.0)
Monocytes Absolute: 0.7 10*3/uL (ref 0.1–1.0)
Monocytes Relative: 14.8 % — ABNORMAL HIGH (ref 3.0–12.0)
NEUTROS ABS: 2.2 10*3/uL (ref 1.4–7.7)
NEUTROS PCT: 49 % (ref 43.0–77.0)
PLATELETS: 151 10*3/uL (ref 150.0–400.0)
RBC: 5.15 Mil/uL (ref 4.22–5.81)
RDW: 13.6 % (ref 11.5–15.5)
WBC: 4.5 10*3/uL (ref 4.0–10.5)

## 2018-03-09 LAB — HEPATIC FUNCTION PANEL
ALK PHOS: 56 U/L (ref 39–117)
ALT: 62 U/L — AB (ref 0–53)
AST: 65 U/L — AB (ref 0–37)
Albumin: 3.7 g/dL (ref 3.5–5.2)
BILIRUBIN TOTAL: 1.6 mg/dL — AB (ref 0.2–1.2)
Bilirubin, Direct: 0.3 mg/dL (ref 0.0–0.3)
Total Protein: 8.3 g/dL (ref 6.0–8.3)

## 2018-03-09 LAB — POCT URINALYSIS DIPSTICK
BILIRUBIN UA: NEGATIVE
Blood, UA: NEGATIVE
GLUCOSE UA: NEGATIVE
KETONES UA: NEGATIVE
LEUKOCYTES UA: NEGATIVE
Nitrite, UA: NEGATIVE
Protein, UA: NEGATIVE
Spec Grav, UA: 1.02 (ref 1.010–1.025)
Urobilinogen, UA: 0.2 E.U./dL
pH, UA: 6 (ref 5.0–8.0)

## 2018-03-09 LAB — LIPID PANEL
CHOLESTEROL: 154 mg/dL (ref 0–200)
HDL: 49.4 mg/dL (ref >39.00)
LDL Cholesterol: 85 mg/dL (ref 0–99)
NonHDL: 104.85
Total CHOL/HDL Ratio: 3
Triglycerides: 97 mg/dL (ref 0.0–149.0)
VLDL: 19.4 mg/dL (ref 0.0–40.0)

## 2018-03-09 LAB — BASIC METABOLIC PANEL
BUN: 9 mg/dL (ref 6–23)
CHLORIDE: 98 meq/L (ref 96–112)
CO2: 29 meq/L (ref 19–32)
Calcium: 9.1 mg/dL (ref 8.4–10.5)
Creatinine, Ser: 0.91 mg/dL (ref 0.40–1.50)
GFR: 87.64 mL/min (ref >60.00)
Glucose, Bld: 85 mg/dL (ref 70–99)
Potassium: 4.4 mEq/L (ref 3.5–5.1)
Sodium: 132 mEq/L — ABNORMAL LOW (ref 135–145)

## 2018-03-09 LAB — TSH: TSH: 2.75 u[IU]/mL (ref 0.35–4.50)

## 2018-03-09 LAB — PSA: PSA: 2.81 ng/mL (ref 0.10–4.00)

## 2018-03-09 MED ORDER — SIMVASTATIN 20 MG PO TABS: 20.0000 mg | ORAL_TABLET | Freq: Every day | ORAL | 4 refills | Status: DC

## 2018-03-09 MED ORDER — ATENOLOL 50 MG PO TABS: 50.0000 mg | ORAL_TABLET | Freq: Every day | ORAL | 4 refills | Status: DC

## 2018-03-09 NOTE — Progress Notes (Signed)
Barry Woods is a 72 year old male nonsmoker who comes in today for a new physical examination because of history I hypertension, hyperlipidemia, rheumatoid arthritis  For hyperlipidemia takes Zocor 20 mg daily along with some baby aspirin. We'll check labs today.  He takes Tenormin 50 mg daily because of a history of hypertension. BP within normal limits  He is on injectable medication via rheumatology for rheumatoid arthritis  He gets routine eye care, dental care, colonoscopy 2016 normal  Vaccinations up-to-date except he was given the Pneumovax 23 today information given on the shingles vaccine  He's having difficulty running. It doesn't run much but he has pain in both feet when he runs. He asked his rheumatologist about it. His rheumatologist is Dr. Jeanette Caprice right arthritis and advised him to get a foot consult. We'll set him up to see Dr. Ileene Rubens at the sports medicine office on [place]  14 point review of systems otherwise negative  EKG was done because a history of hyperlipidemia and hypertension. EKG was normal. Initial EKG showed atrial flutter however there was some is seen malfunction.  BP 130/80 (BP Location: Left Arm, Patient Position: Sitting, Cuff Size: Normal)   Pulse 68   Temp 98 F (36.7 C) (Oral)   Ht 5\' 7"  (1.702 m)   Wt 156 lb (70.8 kg)   BMI 24.43 kg/m  Well-developed well-nourished male no acute distress vital signs stable he is afebrile HEENT were negative except for early bilateral cataracts. Worse on the right than left. Neck was supple thyroid not enlarged no carotid bruits. Cardiopulmonary exam normal,except for abnormal rhythm.. Abdominal exam normal. Genitalia normal circumcised male rectal normal stool guaiac-negative prostate 2+ symmetrical nonnodular dural BPH,,,,,,, admits to nocturia 2,,,,,,, extremities normal skin normal peripheral pulses normal  #1. Hypertension at goal........... Continue current medication check labs  #2 hyperlipidemia...Marland KitchenMarland KitchenMarland Kitchen  Continue Zocor and aspirin check last  #3 rheumatoid arthritis........ Continue follow-up by rheumatology  #4 BPH with nocturia......... Check PSA  #5 bilateral foot pain........ Evaluation by Dr. Ileene Rubens at the [place] [facility] office

## 2018-03-09 NOTE — Patient Instructions (Addendum)
Labs today.......... I will call you the report  Continue your current medications  We will set you up a consult with Dr. Ileene Rubens at our [place] [facility] office for further evaluation of your foot pain.  Return in one year for general physical exam sooner if any problems  Call your insurance company to find out where he get the shingles vaccine.........Marland Kitchen Remember take 2 Tylenol 3 times a day for the first 2 days to negate the side effects from the shingles vaccine

## 2018-03-10 DIAGNOSIS — Z85828 Personal history of other malignant neoplasm of skin: Secondary | ICD-10-CM | POA: Diagnosis not present

## 2018-03-10 DIAGNOSIS — L720 Epidermal cyst: Secondary | ICD-10-CM | POA: Diagnosis not present

## 2018-03-14 ENCOUNTER — Telehealth: Payer: Self-pay | Admitting: Family Medicine

## 2018-03-14 NOTE — Telephone Encounter (Signed)
Notes  Entered on PEC result notes in que

## 2018-03-14 NOTE — Telephone Encounter (Signed)
Copied from [place] [id number]. Topic: Quick Communication - Lab Results >> Mar 14, 2018 11:57 AM K[place]oth[place], Andee Poles, CMA wrote: Called patient to inform them of  lab results. When patient returns call, triage nurse may disclose results.   Pt returning call about lab results.

## 2018-03-22 ENCOUNTER — Encounter: Payer: Self-pay | Admitting: Sports Medicine

## 2018-03-22 ENCOUNTER — Ambulatory Visit: Payer: Medicare HMO | Admitting: Sports Medicine

## 2018-03-22 VITALS — BP 128/80 | HR 73 | Ht 67.0 in | Wt 158.0 lb

## 2018-03-22 DIAGNOSIS — M7741 Metatarsalgia, right foot: Secondary | ICD-10-CM | POA: Diagnosis not present

## 2018-03-22 DIAGNOSIS — M216X1 Other acquired deformities of right foot: Secondary | ICD-10-CM | POA: Diagnosis not present

## 2018-03-22 DIAGNOSIS — M7742 Metatarsalgia, left foot: Secondary | ICD-10-CM | POA: Diagnosis not present

## 2018-03-22 DIAGNOSIS — M069 Rheumatoid arthritis, unspecified: Secondary | ICD-10-CM | POA: Diagnosis not present

## 2018-03-22 DIAGNOSIS — M216X2 Other acquired deformities of left foot: Secondary | ICD-10-CM

## 2018-03-22 NOTE — Progress Notes (Signed)
Barry Woods. Barry Woods, Barry Woods at [place]  Barry Woods - 72 y.o. male MRN [id number]  Date of birth: [birth year]  Visit Date: 03/22/2018  PCP: Dorena Cookey, MD   Referred by: Dorena Cookey, MD  Scribe for today's visit: Wendy Poet, LAT, ATC     SUBJECTIVE:  Barry Woods is here for New Patient (Initial Visit) (B foot pain and swelling) .  Referred by: Dr. Sherren Mocha His B foot pain and swelling symptoms INITIALLY: Began a couple months ago w/ no MOI.  He states that he feels like he is walking on pebbles at the ball of his foot, just proximal to the MTP joints Described as severe sharp pain feeling like he's stepping on pebbles, nonradiating Worsened with running and weight bearing Improved with Dr. Felicie Morn OTC inserts Additional associated symptoms include: no N/T noted in B feet/toes; swelling in B feet    At this time symptoms show no change compared to onset  He has been using Dr. Felicie Morn inserts for the past few weeks and notes limited improvement. Has hx of RA.  ROS Denies night time disturbances. Denies fevers, chills, or night sweats. Denies unexplained weight loss. Denies personal history of cancer. Denies changes in bowel or bladder habits. Denies recent unreported falls. Denies new or worsening dyspnea or wheezing. Denies headaches or dizziness.  Denies numbness, tingling or weakness  In the extremities.  Denies dizziness or presyncopal episodes Denies lower extremity edema    HISTORY & PERTINENT PRIOR DATA:  Prior History reviewed and updated per electronic medical record.  Significant/pertinent history, findings, studies include:  reports that he has never smoked. He has never used smokeless tobacco. No results for input(s): HGBA1C, LABURIC, CREATINE in the last 8760 hours. No specialty comments available. No problems updated.  OBJECTIVE:  VS:  HT:5\' 7"  (170.2 cm)   WT:158 lb (71.7 kg)   BMI:24.74    BP:128/80  HR:73bpm  TEMP: ( )  RESP:97 %   PHYSICAL EXAM: Constitutional: WDWN, Non-toxic appearing. Psychiatric: Alert & appropriately interactive.  Not depressed or anxious appearing. Respiratory: No increased work of breathing.  Trachea Midline Eyes: Pupils are equal.  EOM intact without nystagmus.  No scleral icterus  Vascular Exam: warm to touch no edema  lower extremity neuro exam: unremarkable normal strength normal sensation  MSK Exam: Bilateral widening flexion strength.  There is a small amount of prominence over the posterior aspect of the calcaneus consistent with Haglund deformity this is nontender.  He has a markedly high rigid arch. The transverse arch I explained to him between the third and fourth bilaterally.  He has slight hallux limitus.  DP and PT pulses are 2+/4.   ASSESSMENT & PLAN:   1. Loss of transverse plantar arch of left foot   2. Loss of transverse plantar arch of right foot   3. Metatarsalgia of both feet   4. Rheumatoid arthritis, involving unspecified site, unspecified rheumatoid factor presence ([place])     PLAN: Large longitudinal metatarsal arch pad added to his over the counter insoles he is already using.  He reported good improvement with this today.  4 weeks to ensure clinical improvement and will consider custom cushioned insoles at follow-up improvement.  He is aware of this is likely not covered by insurance and an ABN will neef to be signed.  Follow-up: Return in about 1 month (around 04/19/2018).      Please see additional documentation for  Objective, Assessment and Plan sections. Pertinent additional documentation may be included in corresponding procedure notes, imaging studies, problem based documentation and patient instructions. Please see these sections of the encounter for additional information regarding this visit.  CMA/ATC served as Education administrator during this visit. History, Physical, and Plan performed by medical  provider. Documentation and orders reviewed and attested to.      Gerda Diss, [facility] Physician

## 2018-03-22 NOTE — Patient Instructions (Signed)
Hapad longitudinal metatarsal arch pad - www.hapad.com   Look into having your insurance company cover a set of custom orthotics.  The code is L3030 and there are 2 units.  You can call them  and ask if this is covered.  I am happy to do these for you at any time, you just need to let our front office schedulers know you would like an "orthotic appointment."  Please also make sure you bring athletic shoes with you on the day of your orthotic appointment or whatever shoes you plan to wear your orthotics in most frequently.

## 2018-04-13 DIAGNOSIS — R220 Localized swelling, mass and lump, head: Secondary | ICD-10-CM | POA: Diagnosis not present

## 2018-04-13 DIAGNOSIS — K140 Glossitis: Secondary | ICD-10-CM | POA: Diagnosis not present

## 2018-04-13 DIAGNOSIS — R569 Unspecified convulsions: Secondary | ICD-10-CM | POA: Diagnosis not present

## 2018-04-13 DIAGNOSIS — Z79899 Other long term (current) drug therapy: Secondary | ICD-10-CM | POA: Diagnosis not present

## 2018-04-18 DIAGNOSIS — G250 Essential tremor: Secondary | ICD-10-CM | POA: Diagnosis not present

## 2018-04-18 DIAGNOSIS — R569 Unspecified convulsions: Secondary | ICD-10-CM | POA: Diagnosis not present

## 2018-04-18 DIAGNOSIS — R69 Illness, unspecified: Secondary | ICD-10-CM | POA: Diagnosis not present

## 2018-04-19 DIAGNOSIS — R569 Unspecified convulsions: Secondary | ICD-10-CM | POA: Diagnosis not present

## 2018-04-20 ENCOUNTER — Telehealth: Payer: Self-pay | Admitting: Family Medicine

## 2018-04-20 DIAGNOSIS — L03211 Cellulitis of face: Secondary | ICD-10-CM | POA: Diagnosis not present

## 2018-04-20 DIAGNOSIS — K140 Glossitis: Secondary | ICD-10-CM | POA: Diagnosis not present

## 2018-04-20 DIAGNOSIS — R42 Dizziness and giddiness: Secondary | ICD-10-CM | POA: Diagnosis not present

## 2018-04-20 DIAGNOSIS — R0602 Shortness of breath: Secondary | ICD-10-CM | POA: Diagnosis not present

## 2018-04-20 NOTE — Telephone Encounter (Signed)
Copied from [place] [id number]. Topic: Quick Communication - See Telephone Encounter >> Apr 20, 2018 11:24 AM Ivar Drape wrote: CRM for notification. See Telephone encounter for: 04/20/18. Patient have a spot on his left eye from seizure on 04/09/18.  He also have had an xray and they found that the left lung has a white spot on it.  They are concerned that he has brain cancer.  He found this information out from [facility].  He was told to get in with his PCP ASAP.  Please advise when the patient can be seen.  Please call [phone].

## 2018-04-21 ENCOUNTER — Other Ambulatory Visit: Payer: Self-pay | Admitting: Family Medicine

## 2018-04-21 ENCOUNTER — Ambulatory Visit: Payer: Medicare HMO | Admitting: Adult Health

## 2018-04-21 DIAGNOSIS — J849 Interstitial pulmonary disease, unspecified: Secondary | ICD-10-CM

## 2018-04-21 NOTE — Telephone Encounter (Signed)
A referral to pulmonologist has been placed, spoke with pt stated that he has an appointment with Tommi Rumps and that to hold off the referral until he discusses this issue with [facility].Pt also states that he does not have any problem with his lungs from a CT scan done last year, pt states that his Brain scan was what came back positive and that's the reason he scheduled to see [facility].

## 2018-04-21 NOTE — Telephone Encounter (Signed)
Relation to pt:  self Call back [phone]   Reason for call:  Patient checking on the status of message below, please advise

## 2018-04-21 NOTE — Telephone Encounter (Signed)
Pt

## 2018-04-22 ENCOUNTER — Ambulatory Visit (HOSPITAL_COMMUNITY)
Admission: RE | Admit: 2018-04-22 | Discharge: 2018-04-22 | Disposition: A | Discharge status: Home / Self Care | Payer: Medicare HMO | Source: Ambulatory Visit | Attending: Adult Health | Admitting: Adult Health

## 2018-04-22 ENCOUNTER — Encounter: Payer: Self-pay | Admitting: Adult Health

## 2018-04-22 ENCOUNTER — Ambulatory Visit (INDEPENDENT_AMBULATORY_CARE_PROVIDER_SITE_OTHER): Payer: Medicare HMO | Admitting: Adult Health

## 2018-04-22 VITALS — BP 132/80 | Temp 98.0°F | Wt 155.0 lb

## 2018-04-22 DIAGNOSIS — I6782 Cerebral ischemia: Secondary | ICD-10-CM | POA: Diagnosis not present

## 2018-04-22 DIAGNOSIS — G319 Degenerative disease of nervous system, unspecified: Secondary | ICD-10-CM | POA: Diagnosis not present

## 2018-04-22 DIAGNOSIS — C711 Malignant neoplasm of frontal lobe: Secondary | ICD-10-CM | POA: Diagnosis not present

## 2018-04-22 DIAGNOSIS — G939 Disorder of brain, unspecified: Secondary | ICD-10-CM | POA: Insufficient documentation

## 2018-04-22 DIAGNOSIS — G9389 Other specified disorders of brain: Secondary | ICD-10-CM

## 2018-04-22 LAB — CBC WITH DIFFERENTIAL/PLATELET
BASOS PCT: 1.4 % (ref 0.0–3.0)
Basophils Absolute: 0.1 10*3/uL (ref 0.0–0.1)
EOS ABS: 0.1 10*3/uL (ref 0.0–0.7)
Eosinophils Relative: 2.7 % (ref 0.0–5.0)
HCT: 43.1 % (ref 39.0–52.0)
Hemoglobin: 14.9 g/dL (ref 13.0–17.0)
LYMPHS ABS: 1.3 10*3/uL (ref 0.7–4.0)
LYMPHS PCT: 23.6 % (ref 12.0–46.0)
MCHC: 34.7 g/dL (ref 30.0–36.0)
MCV: 91.2 fl (ref 78.0–100.0)
MONO ABS: 0.8 10*3/uL (ref 0.1–1.0)
Monocytes Relative: 14.3 % — ABNORMAL HIGH (ref 3.0–12.0)
NEUTROS ABS: 3.1 10*3/uL (ref 1.4–7.7)
NEUTROS PCT: 58 % (ref 43.0–77.0)
PLATELETS: 261 10*3/uL (ref 150.0–400.0)
RBC: 4.73 Mil/uL (ref 4.22–5.81)
RDW: 14 % (ref 11.5–15.5)
WBC: 5.4 10*3/uL (ref 4.0–10.5)

## 2018-04-22 LAB — BASIC METABOLIC PANEL
BUN: 14 mg/dL (ref 6–23)
CALCIUM: 9 mg/dL (ref 8.4–10.5)
CHLORIDE: 102 meq/L (ref 96–112)
CO2: 29 meq/L (ref 19–32)
CREATININE: 0.85 mg/dL (ref 0.40–1.50)
GFR: 94.78 mL/min (ref >60.00)
GLUCOSE: 76 mg/dL (ref 70–99)
Potassium: 4.2 mEq/L (ref 3.5–5.1)
Sodium: 137 mEq/L (ref 135–145)

## 2018-04-22 MED ORDER — GADOBENATE DIMEGLUMINE 529 MG/ML IV SOLN
15.0000 mL | Freq: Once | INTRAVENOUS | Status: AC | PRN
  Administered 2018-04-22: 15 mL via INTRAVENOUS

## 2018-04-22 NOTE — Progress Notes (Signed)
Subjective:    Patient ID: Barry Woods, male    DOB: 1946-03-27, 72 y.o.   MRN: [id number]  HPI  72 year old male who  has a past medical history of Arthritis, Hyperlipidemia, Hypertension, and Rheumatoid arthritis ([place]).  He is a patient of Dr. Sherren Mocha who I am seeing today for concern of brain tumor.   Reports having an unwitnessed seizure at home on 04/09/2018 after going through possible alcohol withdrawal. Does not think he had LOC or trauma to the head.   He went and saw a PA friend of his at [facility] who sent him to neurology and obtained a CT scan of the head." CT scan showed a hyperdense lesion measuring approx. 1.0 x 1.5 x 0.8 cm. 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   He denies any blurred vision or headaches.    Review of Systems See HPI   Past Medical History:  Diagnosis Date  . Arthritis   . Hyperlipidemia   . Hypertension   . Rheumatoid arthritis ([place])     Social History   Socioeconomic History  . Marital status: Divorced    Spouse name: Not on file  . Number of children: Not on file  . Years of education: Not on file  . Highest education level: Not on file  Occupational History  . Occupation: Retired-[employer]  . Financial resource strain: Not on file  . Food insecurity:    Worry: Not on file    Inability: Not on file  . Transportation needs:    Medical: Not on file    Non-medical: Not on file  Tobacco Use  . Smoking status: Never Smoker  . Smokeless tobacco: Never Used  Substance and Sexual Activity  . Alcohol use: Yes    Alcohol/week: 8.4 oz    Types: 14 Cans of beer per week  . Drug use: No  . Sexual activity: Not on file  Lifestyle  . Physical activity:    Days per week: Not on file    Minutes per session: Not on file  . Stress: Not on file  Relationships  . Social connections:    Talks on phone: Not on file    Gets together: Not on file    Attends religious service:  Not on file    Active member of club or organization: Not on file    Attends meetings of clubs or organizations: Not on file    Relationship status: Not on file  . Intimate partner violence:    Fear of current or ex partner: Not on file    Emotionally abused: Not on file    Physically abused: Not on file    Forced sexual activity: Not on file  Other Topics Concern  . Not on file  Social History Narrative  . Not on file    Past Surgical History:  Procedure Laterality Date  . COLONOSCOPY    . LIPOMA EXCISION    . PALATE / UVULA BIOPSY / EXCISION    . TONSILLECTOMY     age 65    Family History  Problem Relation Age of Onset  . Colon cancer Mother 26       57  . Hepatitis C Father   . CAD Father     Allergies  Allergen Reactions  . Atorvastatin     REACTION: hives  . Cetirizine Hcl     REACTION: hives    Current Outpatient Medications  on File Prior to Visit  Medication Sig Dispense Refill  . Adalimumab (HUMIRA) 40 MG/0.8ML PSKT Inject into the skin. Every other week    . aspirin EC 81 MG tablet Take 1 tablet (81 mg total) by mouth daily. 90 tablet 3  . atenolol (TENORMIN) 50 MG tablet Take 1 tablet (50 mg total) by mouth daily. 90 tablet 4  . doxycycline (VIBRAMYCIN) 100 MG capsule     . predniSONE (DELTASONE) 5 MG tablet TAKE 6 TABLETS BY MOUTH ONCE DAILY FOR 3 DAYS AND 4 ONCE DAILY FOR 3 DAYS AND 2 ONCE DAILY FOR 3 DAYS  0  . simvastatin (ZOCOR) 20 MG tablet Take 1 tablet (20 mg total) by mouth at bedtime. 100 tablet 4   No current facility-administered medications on file prior to visit.     BP 132/80   Temp 98 F (36.7 C) (Oral)   Wt 155 lb (70.3 kg)   BMI 24.28 kg/m       Objective:   Physical Exam  Constitutional: He is oriented to person, place, and time. He appears well-developed and well-nourished.  HENT:  Head: Normocephalic and atraumatic.  Right Ear: External ear normal.  Left Ear: External ear normal.  Nose: Nose normal.  Mouth/Throat:  Oropharynx is clear and moist.  Eyes: Pupils are equal, round, and reactive to light. Conjunctivae are normal. Right eye exhibits no discharge. Left eye exhibits no discharge. No scleral icterus.  Cardiovascular: Normal rate, regular rhythm, normal heart sounds and intact distal pulses. Exam reveals no gallop and no friction rub.  No murmur heard. Pulmonary/Chest: Effort normal and breath sounds normal. No stridor. No respiratory distress. He has no wheezes. He has no rales. He exhibits no tenderness.  Neurological: He is alert and oriented to person, place, and time. He displays normal reflexes. No cranial nerve deficit or sensory deficit. He exhibits normal muscle tone. Coordination normal.  Skin: Skin is warm and dry. He is not diaphoretic.  Psychiatric: He has a normal mood and affect. His behavior is normal. Judgment and thought content normal.  Nursing note and vitals reviewed.     Assessment & Plan:  1. Frontal mass of brain - Will get STAT MRI of brain. Refer to Neurosurgery and/or Oncology.  - MR Brain W Wo Contrast; Future - Basic Metabolic Panel - CBC with Differential/Platelet - CT Abdomen Pelvis W Contrast; Future  Dorothyann Peng, NP

## 2018-04-26 ENCOUNTER — Other Ambulatory Visit: Payer: Self-pay | Admitting: Adult Health

## 2018-04-26 DIAGNOSIS — D1800 Hemangioma unspecified site: Secondary | ICD-10-CM

## 2018-04-27 ENCOUNTER — Other Ambulatory Visit: Payer: Self-pay | Admitting: Adult Health

## 2018-04-27 ENCOUNTER — Telehealth: Payer: Self-pay | Admitting: Adult Health

## 2018-04-27 NOTE — Telephone Encounter (Signed)
Copied from [place] [id number]. Topic: Quick Communication - See Telephone Encounter >> Apr 27, 2018  8:33 AM Ether Griffins B wrote: CRM for notification. See Telephone encounter for: 04/27/18.  Pt has a couple of questions about his diagnosis. Pulmonary called him yesterday to set up an appt and he declined due to Cory's advise. But he also has a couple of questions regarding his MRI results as well.

## 2018-05-06 ENCOUNTER — Telehealth: Payer: Self-pay

## 2018-05-06 ENCOUNTER — Other Ambulatory Visit: Payer: Self-pay | Admitting: Family Medicine

## 2018-05-06 DIAGNOSIS — R748 Abnormal levels of other serum enzymes: Secondary | ICD-10-CM

## 2018-05-06 NOTE — Telephone Encounter (Signed)
Labs for Hepatic Function has been placed to check for elevated liver enzymes.

## 2018-05-11 ENCOUNTER — Other Ambulatory Visit (INDEPENDENT_AMBULATORY_CARE_PROVIDER_SITE_OTHER): Payer: Medicare HMO

## 2018-05-11 DIAGNOSIS — R748 Abnormal levels of other serum enzymes: Secondary | ICD-10-CM

## 2018-05-11 DIAGNOSIS — Q283 Other malformations of cerebral vessels: Secondary | ICD-10-CM | POA: Diagnosis not present

## 2018-05-11 DIAGNOSIS — I10 Essential (primary) hypertension: Secondary | ICD-10-CM | POA: Diagnosis not present

## 2018-05-11 LAB — HEPATIC FUNCTION PANEL
ALK PHOS: 56 U/L (ref 39–117)
ALT: 47 U/L (ref 0–53)
AST: 36 U/L (ref 0–37)
Albumin: 3.9 g/dL (ref 3.5–5.2)
BILIRUBIN TOTAL: 1.4 mg/dL — AB (ref 0.2–1.2)
Bilirubin, Direct: 0.2 mg/dL (ref 0.0–0.3)
Total Protein: 7.8 g/dL (ref 6.0–8.3)

## 2018-05-11 NOTE — Telephone Encounter (Unsigned)
Copied from [place] [phone]. Topic: Quick Communication - See Telephone Encounter >> May 06, 2018  1:19 PM Hewitt Shorts wrote: Pt called to schedule  a lab visit to test liver enzymes but the only order in sys was in 01/2017 and not sure it applied -please comfirm and schedule pt or put new order in   Best number [phone] >> May 09, 2018 11:01 AM Synthia Innocent wrote: Requesting to have lab on 6/19 10:30-11

## 2018-05-12 ENCOUNTER — Telehealth: Payer: Self-pay | Admitting: Family Medicine

## 2018-05-12 NOTE — Telephone Encounter (Signed)
Results are in just wanting on PCP to have the chance to review.

## 2018-05-12 NOTE — Telephone Encounter (Unsigned)
Copied from [place] [id number]. Topic: Quick Communication - Lab Results >> May 12, 2018  3:34 PM Percell Belt A wrote: Pt called in and wanted me to send a message that he call to inquire about his lab results from yesterday? Please advise

## 2018-05-13 NOTE — Telephone Encounter (Signed)
Pt called back in to follow up, advised pt of message below, pt would like to know since PCP is out of the office if a different provider could review results and call back. Please advise.

## 2018-05-13 NOTE — Telephone Encounter (Signed)
Dr. Sarajane Jews, could you review these results in PCP's absence? Looks like it was just a hepatic function panel.

## 2018-05-13 NOTE — Telephone Encounter (Signed)
Called and spoke with pt. Pt advised and voiced understanding. Pt wanted the results note to be sent to his Mychart. Results have been sent.

## 2018-05-13 NOTE — Telephone Encounter (Signed)
His liver test results are normal

## 2018-05-15 ENCOUNTER — Encounter: Payer: Self-pay | Admitting: Adult Health

## 2018-05-16 ENCOUNTER — Ambulatory Visit: Payer: Self-pay | Admitting: Family Medicine

## 2018-05-16 ENCOUNTER — Ambulatory Visit (INDEPENDENT_AMBULATORY_CARE_PROVIDER_SITE_OTHER): Payer: Medicare HMO | Admitting: Adult Health

## 2018-05-16 ENCOUNTER — Encounter: Payer: Self-pay | Admitting: Adult Health

## 2018-05-16 VITALS — BP 138/80 | Temp 98.4°F | Wt 153.0 lb

## 2018-05-16 DIAGNOSIS — I10 Essential (primary) hypertension: Secondary | ICD-10-CM | POA: Diagnosis not present

## 2018-05-16 NOTE — Progress Notes (Signed)
Subjective:    Patient ID: Barry Woods, male    DOB: 1946/05/04, 72 y.o.   MRN: [id number]  HPI   72 year old male who  has a past medical history of Arthritis, Hyperlipidemia, Hypertension, and Rheumatoid arthritis ([place]). He presents to the office today for elevated blood pressure readings at home. His home blood pressure cuff has been having reading between 140-190/70-90. His daughter who is a ICU RN has been checking his blood pressure and has had readings of 178/88 and 188/80's. He is currently taking Atenolol 50 daily.   He reports no symptoms of headache or blurred vision   He reports that his blood cuff is " very old"   BP Readings from Last 3 Encounters:  05/16/18 138/80  04/22/18 132/80  03/22/18 128/80    Review of Systems  See HPI   Past Medical History:  Diagnosis Date  . Arthritis   . Hyperlipidemia   . Hypertension   . Rheumatoid arthritis ([place])     Social History   Socioeconomic History  . Marital status: Divorced    Spouse name: Not on file  . Number of children: Not on file  . Years of education: Not on file  . Highest education level: Not on file  Occupational History  . Occupation: Retired-[employer]  . Financial resource strain: Not on file  . Food insecurity:    Worry: Not on file    Inability: Not on file  . Transportation needs:    Medical: Not on file    Non-medical: Not on file  Tobacco Use  . Smoking status: Never Smoker  . Smokeless tobacco: Never Used  Substance and Sexual Activity  . Alcohol use: Yes    Alcohol/week: 8.4 oz    Types: 14 Cans of beer per week  . Drug use: No  . Sexual activity: Not on file  Lifestyle  . Physical activity:    Days per week: Not on file    Minutes per session: Not on file  . Stress: Not on file  Relationships  . Social connections:    Talks on phone: Not on file    Gets together: Not on file    Attends religious service: Not on file    Active member of club or  organization: Not on file    Attends meetings of clubs or organizations: Not on file    Relationship status: Not on file  . Intimate partner violence:    Fear of current or ex partner: Not on file    Emotionally abused: Not on file    Physically abused: Not on file    Forced sexual activity: Not on file  Other Topics Concern  . Not on file  Social History Narrative  . Not on file    Past Surgical History:  Procedure Laterality Date  . COLONOSCOPY    . LIPOMA EXCISION    . PALATE / UVULA BIOPSY / EXCISION    . TONSILLECTOMY     age 32    Family History  Problem Relation Age of Onset  . Colon cancer Mother 54       57  . Hepatitis C Father   . CAD Father     Allergies  Allergen Reactions  . Atorvastatin     REACTION: hives  . Cetirizine Hcl     REACTION: hives    Current Outpatient Medications on File Prior to Visit  Medication Sig Dispense Refill  .  Adalimumab (HUMIRA) 40 MG/0.8ML PSKT Inject into the skin. Every other week    . atenolol (TENORMIN) 50 MG tablet Take 1 tablet (50 mg total) by mouth daily. 90 tablet 4  . simvastatin (ZOCOR) 20 MG tablet Take 1 tablet (20 mg total) by mouth at bedtime. 100 tablet 4   No current facility-administered medications on file prior to visit.     BP 138/80   Temp 98.4 F (36.9 C) (Oral)   Wt 153 lb (69.4 kg)   BMI 23.96 kg/m       Objective:   Physical Exam  Constitutional: He is oriented to person, place, and time. He appears well-developed and well-nourished. No distress.  Cardiovascular: Normal rate, regular rhythm, normal heart sounds and intact distal pulses.  Pulmonary/Chest: Effort normal and breath sounds normal.  Neurological: He is alert and oriented to person, place, and time.  Skin: Skin is warm and dry. He is not diaphoretic.  Psychiatric: He has a normal mood and affect. His behavior is normal. Judgment and thought content normal.  Nursing note and vitals reviewed.     Assessment & Plan:  1.  Essential hypertension - I am going to have him get a new blood pressure cuff  - Send me results in one week or sooner if needed - Consider increasing Atenolol to 75 mg or adding agent   Dorothyann Peng, NP

## 2018-05-16 NOTE — Telephone Encounter (Signed)
I returned his call.   Agent made him an appt for today with Dorothyann Peng, AGNP-C at 2:45.   He was wondering if there was anything else he could do prior to being seen.   He denies any symptoms just his BP running high for him.   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   I let him know to keep his appt this evening and Tommi Rumps will evaluate the need for medication changes.   I went over the s/s to look for and go to the ED right away if he experienced them and NOT wait on his appt.   He verbalized understanding.    Reason for Disposition . Systolic BP  >= 854 OR Diastolic >= 627  Answer Assessment - Initial Assessment Questions 1. BLOOD PRESSURE: "What is the blood pressure?" "Did you take at least two measurements 5 minutes apart?"     This morning it  Is 165/95.   Denies any symptoms:  Headache, blurred vision, weakness on one side of body, dizziness, slurred speech, etc.   He has an appt for today with Dorothyann Peng at the [place] office location.   2. ONSET: "When did you take your blood pressure?"     This morning. 3. HOW: "How did you obtain the blood pressure?" (e.g., visiting nurse, automatic home BP monitor)     Home automatic machine and my daughter is a nurse she checked it also.  My bottom number yesterday was 100.  I let him know I was not able to make changes in his medication regimen so be sure and come to his appt this evening with [facility]. 4. HISTORY: "Do you have a history of high blood pressure?"     *No Answer* 5. MEDICATIONS: "Are you taking any medications for blood pressure?" "Have you missed any doses recently?"     *No Answer* 6. OTHER SYMPTOMS: "Do you have any symptoms?" (e.g., headache, chest pain, blurred vision, difficulty breathing, weakness)     Denies any of the above symptoms.   I instructed him to go to the ED if he experienced any symptoms that I went over with him prior to his appt.   Not to wait on the appt.   7.  PREGNANCY: "Is there any chance you are pregnant?" "When was your last menstrual period?"     N/A  Protocols used: HIGH BLOOD PRESSURE-A-AH

## 2018-05-18 ENCOUNTER — Other Ambulatory Visit: Payer: Medicare HMO

## 2018-05-20 ENCOUNTER — Encounter: Payer: Self-pay | Admitting: Adult Health

## 2018-06-01 ENCOUNTER — Encounter: Payer: Self-pay | Admitting: Adult Health

## 2018-06-13 ENCOUNTER — Encounter: Payer: Self-pay | Admitting: Adult Health

## 2018-06-15 ENCOUNTER — Ambulatory Visit (INDEPENDENT_AMBULATORY_CARE_PROVIDER_SITE_OTHER): Payer: Medicare HMO | Admitting: Adult Health

## 2018-06-15 ENCOUNTER — Encounter: Payer: Self-pay | Admitting: Adult Health

## 2018-06-15 VITALS — HR 66 | Temp 98.5°F | Ht 67.0 in | Wt 162.6 lb

## 2018-06-15 DIAGNOSIS — I10 Essential (primary) hypertension: Secondary | ICD-10-CM | POA: Diagnosis not present

## 2018-06-15 NOTE — Progress Notes (Signed)
Subjective:    Patient ID: Barry Woods, male    DOB: 31-Aug-1946, 72 y.o.   MRN: [id number]  HPI  72 year old male who  has a past medical history of Arthritis, Hyperlipidemia, Hypertension, and Rheumatoid arthritis ([place]).  He presents to the office today for follow up regarding hypertension. He is currently maintained on Atenolol 75 mg. He has monitoring his blood pressure at home and has been getting readings 130-170/70-90's   Today in the office his BP is 138/80  He denies any headaches, blurred vision, lightheadedness or dizziness  Review of Systems See HPI   Past Medical History:  Diagnosis Date  . Arthritis   . Hyperlipidemia   . Hypertension   . Rheumatoid arthritis ([place])     Social History   Socioeconomic History  . Marital status: Divorced    Spouse name: Not on file  . Number of children: Not on file  . Years of education: Not on file  . Highest education level: Not on file  Occupational History  . Occupation: Retired-[employer]  . Financial resource strain: Not on file  . Food insecurity:    Worry: Not on file    Inability: Not on file  . Transportation needs:    Medical: Not on file    Non-medical: Not on file  Tobacco Use  . Smoking status: Never Smoker  . Smokeless tobacco: Never Used  Substance and Sexual Activity  . Alcohol use: Yes    Alcohol/week: 8.4 oz    Types: 14 Cans of beer per week  . Drug use: No  . Sexual activity: Not on file  Lifestyle  . Physical activity:    Days per week: Not on file    Minutes per session: Not on file  . Stress: Not on file  Relationships  . Social connections:    Talks on phone: Not on file    Gets together: Not on file    Attends religious service: Not on file    Active member of club or organization: Not on file    Attends meetings of clubs or organizations: Not on file    Relationship status: Not on file  . Intimate partner violence:    Fear of current or ex partner: Not on  file    Emotionally abused: Not on file    Physically abused: Not on file    Forced sexual activity: Not on file  Other Topics Concern  . Not on file  Social History Narrative  . Not on file    Past Surgical History:  Procedure Laterality Date  . COLONOSCOPY    . LIPOMA EXCISION    . PALATE / UVULA BIOPSY / EXCISION    . TONSILLECTOMY     age 71    Family History  Problem Relation Age of Onset  . Colon cancer Mother 30       57  . Hepatitis C Father   . CAD Father     Allergies  Allergen Reactions  . Atorvastatin     REACTION: hives  . Cetirizine Hcl     REACTION: hives    Current Outpatient Medications on File Prior to Visit  Medication Sig Dispense Refill  . Adalimumab (HUMIRA) 40 MG/0.8ML PSKT Inject into the skin. Every other week    . atenolol (TENORMIN) 50 MG tablet Take 1 tablet (50 mg total) by mouth daily. (Patient taking differently: Take 75 mg by mouth daily. ) 90 tablet  4  . simvastatin (ZOCOR) 20 MG tablet Take 1 tablet (20 mg total) by mouth at bedtime. 100 tablet 4   No current facility-administered medications on file prior to visit.     Pulse 66   Temp 98.5 F (36.9 C) (Oral)   Ht 5\' 7"  (1.702 m)   Wt 162 lb 9.6 oz (73.8 kg)   SpO2 96%   BMI 25.47 kg/m       Objective:   Physical Exam  Constitutional: He is oriented to person, place, and time. He appears well-developed and well-nourished. No distress.  Cardiovascular: Normal rate, regular rhythm, normal heart sounds and intact distal pulses.  Pulmonary/Chest: Effort normal and breath sounds normal.  Musculoskeletal: Normal range of motion.  Neurological: He is alert and oriented to person, place, and time. He displays tremor.  Skin: Skin is warm and dry. Capillary refill takes less than 2 seconds. He is not diaphoretic.  Psychiatric: He has a normal mood and affect. His behavior is normal. Judgment and thought content normal.  Vitals reviewed.     Assessment & Plan:  1. Essential  hypertension - He does have an essential tremor and I think this is causing his BP readings to be off at home. BP controlled in the office today  - I am not going to make any changes at this time  - He will have his daughter who is an Therapist, sports due manual blood pressure readings and send me the results   Dorothyann Peng, NP

## 2018-08-17 ENCOUNTER — Encounter: Payer: Self-pay | Admitting: Adult Health

## 2018-08-17 ENCOUNTER — Other Ambulatory Visit: Payer: Self-pay | Admitting: Adult Health

## 2018-08-17 MED ORDER — ATENOLOL 50 MG PO TABS: 75.0000 mg | ORAL_TABLET | Freq: Every day | ORAL | 1 refills | Status: DC

## 2018-08-17 NOTE — Telephone Encounter (Signed)
Patient sent another MyChart message with info below:  Barry Woods,  The Med is for BP -Atenolol  [name]

## 2018-08-24 ENCOUNTER — Encounter: Payer: Self-pay | Admitting: Adult Health

## 2018-08-26 DIAGNOSIS — Z6823 Body mass index (BMI) 23.0-23.9, adult: Secondary | ICD-10-CM | POA: Diagnosis not present

## 2018-08-26 DIAGNOSIS — R945 Abnormal results of liver function studies: Secondary | ICD-10-CM | POA: Diagnosis not present

## 2018-08-26 DIAGNOSIS — M0589 Other rheumatoid arthritis with rheumatoid factor of multiple sites: Secondary | ICD-10-CM | POA: Diagnosis not present

## 2018-09-07 NOTE — Progress Notes (Signed)
Chief Complaint  Patient presents with  . Follow-up    CAD   History of Present Illness: 72 yo male with history of HTN, HLD and rheumatoid arthritis here today for cardiac follow up. I saw him as a new consult for evaluation of abnormal CT scan on 04/05/17. CT chest showed coronary calcification. He had no complaints of chest pain or dyspnea. He is very active. I arranged a nuclear stress test on 04/08/17 which was normal and showed no ischemia. LV function was normal. He stopped drinking alcohlol in May 2019 and had a seizure. He had a brain scan that showed possible tumor. 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   He is here today for follow up. The patient denies any chest pain, dyspnea, palpitations, lower extremity edema, orthopnea, PND, dizziness, near syncope or syncope.   Primary Care Physician: Dorothyann Peng, NP  Past Medical History:  Diagnosis Date  . Arthritis   . Cerebral cavernoma   . Hyperlipidemia   . Hypertension   . Rheumatoid arthritis [facility])     Past Surgical History:  Procedure Laterality Date  . COLONOSCOPY    . LIPOMA EXCISION    . PALATE / UVULA BIOPSY / EXCISION    . TONSILLECTOMY     age 37    Current Outpatient Medications  Medication Sig Dispense Refill  . Adalimumab (HUMIRA) 40 MG/0.8ML PSKT Inject into the skin. Every other week    . atenolol (TENORMIN) 50 MG tablet Take 1.5 tablets (75 mg total) by mouth daily. 135 tablet 1  . simvastatin (ZOCOR) 20 MG tablet Take 1 tablet (20 mg total) by mouth at bedtime. 100 tablet 4   No current facility-administered medications for this visit.     Allergies  Allergen Reactions  . Atorvastatin     REACTION: hives  . Cetirizine Hcl     REACTION: hives    Social History   Socioeconomic History  . Marital status: Divorced    Spouse name: Not on file  . Number of children: Not on file  . Years of education: Not on file  . Highest education level: Not on file    Occupational History  . Occupation: Retired-[employer]  . Financial resource strain: Not on file  . Food insecurity:    Worry: Not on file    Inability: Not on file  . Transportation needs:    Medical: Not on file    Non-medical: Not on file  Tobacco Use  . Smoking status: Never Smoker  . Smokeless tobacco: Never Used  Substance and Sexual Activity  . Alcohol use: Yes    Alcohol/week: 14.0 standard drinks    Types: 14 Cans of beer per week  . Drug use: No  . Sexual activity: Not on file  Lifestyle  . Physical activity:    Days per week: Not on file    Minutes per session: Not on file  . Stress: Not on file  Relationships  . Social connections:    Talks on phone: Not on file    Gets together: Not on file    Attends religious service: Not on file    Active member of club or organization: Not on file    Attends meetings of clubs or organizations: Not on file    Relationship status: Not on file  . Intimate partner violence:    Fear of current or ex partner: Not on file  Emotionally abused: Not on file    Physically abused: Not on file    Forced sexual activity: Not on file  Other Topics Concern  . Not on file  Social History Narrative  . Not on file    Family History  Problem Relation Age of Onset  . Colon cancer Mother 2       57  . Hepatitis C Father   . CAD Father     Review of Systems:  As stated in the HPI and otherwise negative.   BP 130/82   Pulse 65   Ht 5\' 7"  (1.702 m)   Wt 159 lb 12.8 oz (72.5 kg)   SpO2 98%   BMI 25.03 kg/m   Physical Examination: General: Well developed, well nourished, NAD  HEENT: OP clear, mucus membranes moist  SKIN: warm, dry. No rashes. Neuro: No focal deficits  Musculoskeletal: Muscle strength 5/5 all ext  Psychiatric: Mood and affect normal  Neck: No JVD, no carotid bruits, no thyromegaly, no lymphadenopathy.  Lungs:Clear bilaterally, no wheezes, rhonci, crackles Cardiovascular: Regular rate and  rhythm. No murmurs, gallops or rubs. Abdomen:Soft. Bowel sounds present. Non-tender.  Extremities: No lower extremity edema. Pulses are 2 + in the bilateral DP/PT.  CT chest November 2017: Cardiovascular: Atherosclerotic calcification of the arterial vasculature, including three-vessel involvement of the coronary arteries. Heart size within normal limits. No pericardial effusion.  Mediastinum/Nodes: Mediastinal lymph nodes are not enlarged by CT size criteria. Hilar regions are difficult to definitively evaluate without IV contrast. No axillary adenopathy. Esophagus is grossly unremarkable. Tiny hiatal hernia.  Lungs/Pleura: Peripheral and basilar predominant subpleural ground-glass, reticulation and mild bronchiolectasis, unchanged from 09/04/2016 and new from 06/13/2010. No air trapping. No pleural fluid. Airway is unremarkable.  Upper Abdomen: Visualized portion of the liver is decreased in attenuation diffusely. Visualized portions of the adrenal glands, kidneys, spleen and pancreas are grossly unremarkable. Tiny hiatal hernia. No upper abdominal adenopathy.  Musculoskeletal: No worrisome lytic or sclerotic lesions.  IMPRESSION: 1. Pulmonary parenchymal pattern of mid/lower lung zones subpleural reticulation, ground-glass and mild bronchiolectasis, unchanged from 09/04/2016 and new from 06/13/2010. Findings are highly suspicious for interstitial lung disease such as nonspecific interstitial pneumonitis or usual interstitial pneumonitis. Consider follow-up high-resolution chest CT in 1 year, as clinically indicated. 2. Aortic atherosclerosis (ICD10-170.0). Three-vessel coronary artery calcification. 3. Fatty liver.  Nuclear stress May 2018:  Nuclear stress EF: 71%.  There was no ST segment deviation noted during stress.  The study is normal.  This is a low risk study.  The left ventricular ejection fraction is hyperdynamic (>65%).   Normal pharmacologic  nuclear study with no evidence of prior infarct or ischemia.  EKG:  EKG is not ordered today.  The EKG today demonstrates   Recent Labs: 03/09/2018: TSH 2.75 04/22/2018: BUN 14; Creatinine, Ser 0.85; Hemoglobin 14.9; Platelets 261.0; Potassium 4.2; Sodium 137 05/11/2018: ALT 47   Lipid Panel    Component Value Date/Time   CHOL 154 03/09/2018 1140   TRIG 97.0 03/09/2018 1140   TRIG 150 (H) 09/28/2006 0957   HDL 49.40 03/09/2018 1140   CHOLHDL 3 03/09/2018 1140   VLDL 19.4 03/09/2018 1140   LDLCALC 85 03/09/2018 1140   LDLDIRECT 126.5 01/18/2013 0807     Wt Readings from Last 3 Encounters:  09/08/18 159 lb 12.8 oz (72.5 kg)  06/15/18 162 lb 9.6 oz (73.8 kg)  05/16/18 153 lb (69.4 kg)     Other studies Reviewed: Additional studies/ records that were reviewed today include: .  Review of the above records demonstrates:   Assessment and Plan:   1. CAD without angina: He has coronary calcification noted on CT chest. Nuclear stress test May 2018 with no ischemia. No chest pain. Continue statin and beta blocker.     Current medicines are reviewed at length with the patient today.  The patient does not have concerns regarding medicines.  The following changes have been made:  no change  Labs/ tests ordered today include:   No orders of the defined types were placed in this encounter.  Disposition:   FU with me in 12 months.   Signed, Lauree Chandler, MD 09/08/2018 9:37 AM    [address], [place], [place]  [address] Phone: [phone]; Fax: [phone]

## 2018-09-08 ENCOUNTER — Ambulatory Visit (INDEPENDENT_AMBULATORY_CARE_PROVIDER_SITE_OTHER): Payer: Medicare HMO | Admitting: Cardiovascular Disease

## 2018-09-08 ENCOUNTER — Encounter: Payer: Self-pay | Admitting: Cardiovascular Disease

## 2018-09-08 VITALS — BP 130/82 | HR 65 | Ht 67.0 in | Wt 159.8 lb

## 2018-09-08 DIAGNOSIS — I2510 Atherosclerotic heart disease of native coronary artery without angina pectoris: Secondary | ICD-10-CM

## 2018-09-08 NOTE — Patient Instructions (Signed)

## 2018-09-22 DIAGNOSIS — R69 Illness, unspecified: Secondary | ICD-10-CM | POA: Diagnosis not present

## 2018-10-09 ENCOUNTER — Encounter: Payer: Self-pay | Admitting: Family Medicine

## 2018-10-30 ENCOUNTER — Telehealth: Payer: Self-pay | Admitting: Cardiology

## 2018-10-30 NOTE — Telephone Encounter (Signed)
Pt called with new -over the past several weeks- exertional dyspnea. No chest pain, no rest dyspnea, no orthopnea.  I suggested we see him in the office ASAP. He knows to go to the ED if he develops rest symptoms.   Kerin Ransom PA-C 10/30/2018 11:31 AM

## 2018-10-31 DIAGNOSIS — I2510 Atherosclerotic heart disease of native coronary artery without angina pectoris: Secondary | ICD-10-CM | POA: Insufficient documentation

## 2018-10-31 NOTE — Progress Notes (Signed)
Cardiology Office Note    Date:  11/01/2018   ID:  Barry Woods, DOB 05-11-1946, MRN [id number]  PCP:  Dorothyann Peng, NP  Cardiologist: Lauree Chandler, MD EPS: None  No chief complaint on file.   History of Present Illness:  Barry Woods is a 72 y.o. male history of hypertension, HLD, rheumatoid arthritis, coronary calcification on chest CT scan 09/2016.  Nuclear stress test 04/08/2017 normal with no ischemia normal LV function.  Patient stopped drinking alcohol 03/2018 and had a seizure.  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  Last saw Dr. Angelena Form 09/08/2018 at which time he was doing well.  Patient was added on to my schedule for worsening dyspnea on exertion with little activity as well as dizziness.  Prior to this he was jogging 2 miles daily. This has been going on for 3 weeks.  EKG today shows new onset atrial fibrillation.  He has to stop walking after very short distance.  He woke up short of breath the other day as well.  Past Medical History:  Diagnosis Date  . Arthritis   . Cerebral cavernoma   . Hyperlipidemia   . Hypertension   . Rheumatoid arthritis [facility])     Past Surgical History:  Procedure Laterality Date  . COLONOSCOPY    . LIPOMA EXCISION    . PALATE / UVULA BIOPSY / EXCISION    . TONSILLECTOMY     age 71    Current Medications: Current Meds  Medication Sig  . Adalimumab (HUMIRA) 40 MG/0.8ML PSKT Inject into the skin. Every other week  . atenolol (TENORMIN) 50 MG tablet Take 1.5 tablets (75 mg total) by mouth daily.  . simvastatin (ZOCOR) 20 MG tablet Take 1 tablet (20 mg total) by mouth at bedtime.     Allergies:   Atorvastatin and Cetirizine hcl   Social History   Socioeconomic History  . Marital status: Divorced    Spouse name: Not on file  . Number of children: Not on file  . Years of education: Not on file  . Highest education level: Not on file  Occupational History  .  Occupation: Retired-[employer]  . Financial resource strain: Not on file  . Food insecurity:    Worry: Not on file    Inability: Not on file  . Transportation needs:    Medical: Not on file    Non-medical: Not on file  Tobacco Use  . Smoking status: Never Smoker  . Smokeless tobacco: Never Used  Substance and Sexual Activity  . Alcohol use: Yes    Alcohol/week: 14.0 standard drinks    Types: 14 Cans of beer per week  . Drug use: No  . Sexual activity: Not on file  Lifestyle  . Physical activity:    Days per week: Not on file    Minutes per session: Not on file  . Stress: Not on file  Relationships  . Social connections:    Talks on phone: Not on file    Gets together: Not on file    Attends religious service: Not on file    Active member of club or organization: Not on file    Attends meetings of clubs or organizations: Not on file    Relationship status: Not on file  Other Topics Concern  . Not on file  Social History Narrative  . Not on file     Family History:  The patient's family  history includes CAD in his father; Colon cancer (age of onset: 39) in his mother; Hepatitis C in his father.   ROS:   Please see the history of present illness.    Review of Systems  Constitution: Negative.  HENT: Negative.   Cardiovascular: Positive for dyspnea on exertion, irregular heartbeat and orthopnea.  Respiratory: Positive for shortness of breath.   Endocrine: Negative.   Hematologic/Lymphatic: Negative.   Musculoskeletal: Negative.   Gastrointestinal: Negative.   Genitourinary: Negative.   Neurological: Negative.    All other systems reviewed and are negative.   PHYSICAL EXAM:   VS:  BP 132/86   Pulse 74   Ht 5\' 7"  (1.702 m)   Wt 169 lb 9.6 oz (76.9 kg)   SpO2 95%   BMI 26.56 kg/m   Physical Exam  GEN: Well nourished, well developed, in no acute distress  Neck: Slight increase JVD, no carotid bruits, or masses Cardiac: Irregular irregular; no  murmurs, rubs, or gallops  Respiratory:  clear to auscultation bilaterally, normal work of breathing GI: soft, nontender, nondistended, + BS Ext: without cyanosis, clubbing, or edema, Good distal pulses bilaterally Neuro:  Alert and Oriented x 3Psych: euthymic mood, full affect  Wt Readings from Last 3 Encounters:  11/01/18 169 lb 9.6 oz (76.9 kg)  09/08/18 159 lb 12.8 oz (72.5 kg)  06/15/18 162 lb 9.6 oz (73.8 kg)      Studies/Labs Reviewed:   EKG:  EKG is ordered today.  The ekg ordered today demonstrates atrial fibrillation at 74 bpm  Recent Labs: 03/09/2018: TSH 2.75 04/22/2018: BUN 14; Creatinine, Ser 0.85; Hemoglobin 14.9; Platelets 261.0; Potassium 4.2; Sodium 137 05/11/2018: ALT 47   Lipid Panel    Component Value Date/Time   CHOL 154 03/09/2018 1140   TRIG 97.0 03/09/2018 1140   TRIG 150 (H) 09/28/2006 0957   HDL 49.40 03/09/2018 1140   CHOLHDL 3 03/09/2018 1140   VLDL 19.4 03/09/2018 1140   LDLCALC 85 03/09/2018 1140   LDLDIRECT 126.5 01/18/2013 0807    Additional studies/ records that were reviewed today include:   CT chest November 2017: Cardiovascular: Atherosclerotic calcification of the arterial vasculature, including three-vessel involvement of the coronary arteries. Heart size within normal limits. No pericardial effusion.   Mediastinum/Nodes: Mediastinal lymph nodes are not enlarged by CT size criteria. Hilar regions are difficult to definitively evaluate without IV contrast. No axillary adenopathy. Esophagus is grossly unremarkable. Tiny hiatal hernia.   Lungs/Pleura: Peripheral and basilar predominant subpleural ground-glass, reticulation and mild bronchiolectasis, unchanged from 09/04/2016 and new from 06/13/2010. No air trapping. No pleural fluid. Airway is unremarkable.   Upper Abdomen: Visualized portion of the liver is decreased in attenuation diffusely. Visualized portions of the adrenal glands, kidneys, spleen and pancreas are grossly  unremarkable. Tiny hiatal hernia. No upper abdominal adenopathy.   Musculoskeletal: No worrisome lytic or sclerotic lesions.   IMPRESSION: 1. Pulmonary parenchymal pattern of mid/lower lung zones subpleural reticulation, ground-glass and mild bronchiolectasis, unchanged from 09/04/2016 and new from 06/13/2010. Findings are highly suspicious for interstitial lung disease such as nonspecific interstitial pneumonitis or usual interstitial pneumonitis. Consider follow-up high-resolution chest CT in 1 year, as clinically indicated. 2. Aortic atherosclerosis (ICD10-170.0). Three-vessel coronary artery calcification. 3. Fatty liver.   Nuclear stress May 2018:  Nuclear stress EF: 71%.  There was no ST segment deviation noted during stress.  The study is normal.  This is a low risk study.  The left ventricular ejection fraction is hyperdynamic (>65%).   Normal pharmacologic  nuclear study with no evidence of prior infarct or ischemia.     ASSESSMENT:    1. New onset atrial fibrillation ([place])   2. Dyspnea on exertion   3. Coronary artery calcification seen on CT scan      PLAN:  In order of problems listed above:  Atrial fibrillation new onset is probably been going on for 3 weeks.  Rate is controlled in the 70s on Tenormin.  He is not tolerating atrial fibrillation at all and has significant dyspnea on exertion.  We walked him around the office and his O2 sats dropped to 91% pretty quickly.  Heart rate remained controlled.  We will begin Eliquis 5 mg twice daily, check 2D echo, surveillance labs as well as a BNP.  TSH normal in April. return in 1 month for scheduling elective cardioversion with Dr. Angelena Form.  I have also given him the option for possible TEE guided cardioversion since he is feeling so poorly.  He wants to hold off for now.  Dyspnea on exertion neck veins slightly elevated but lungs clear and no edema.  Check BNP.  Coronary artery calcification seen on CT 09/2016  with follow-up nuclear stress test 03/2017 that was normal without ischemia.  No chest pain.    Medication Adjustments/Labs and Tests Ordered: Current medicines are reviewed at length with the patient today.  Concerns regarding medicines are outlined above.  Medication changes, Labs and Tests ordered today are listed in the Patient Instructions below. There are no Patient Instructions on file for this visit.   Sumner Boast, PA-C  11/01/2018 10:13 AM    [facility] [address], [place], [place]  [address] Phone: [phone]; Fax: [phone]

## 2018-11-01 ENCOUNTER — Ambulatory Visit (HOSPITAL_COMMUNITY): Payer: Medicare HMO | Attending: Internal Medicine

## 2018-11-01 ENCOUNTER — Encounter: Payer: Self-pay | Admitting: Physician Assistant

## 2018-11-01 ENCOUNTER — Ambulatory Visit: Payer: Medicare HMO | Admitting: Physician Assistant

## 2018-11-01 ENCOUNTER — Other Ambulatory Visit: Payer: Self-pay

## 2018-11-01 VITALS — BP 132/86 | HR 74 | Ht 67.0 in | Wt 169.6 lb

## 2018-11-01 DIAGNOSIS — I2510 Atherosclerotic heart disease of native coronary artery without angina pectoris: Secondary | ICD-10-CM | POA: Diagnosis not present

## 2018-11-01 DIAGNOSIS — I4891 Unspecified atrial fibrillation: Secondary | ICD-10-CM | POA: Diagnosis not present

## 2018-11-01 DIAGNOSIS — R0609 Other forms of dyspnea: Secondary | ICD-10-CM

## 2018-11-01 DIAGNOSIS — R0600 Dyspnea, unspecified: Secondary | ICD-10-CM

## 2018-11-01 LAB — ECHOCARDIOGRAM COMPLETE
Height: 67 in
Weight: 2713.6 oz

## 2018-11-01 MED ORDER — APIXABAN 5 MG PO TABS: 5.0000 mg | ORAL_TABLET | Freq: Two times a day (BID) | ORAL | 11 refills | Status: DC

## 2018-11-01 NOTE — Patient Instructions (Addendum)
Medication Instructions:  Your physician has recommended you make the following change in your medication:   START: eliquis (apixaban) 5 mg tablet: Take 1 tablet by mouth twice a day  Avoid NSAIDS (ie. ibuprofen, advil, aleve) while on this medicine to decrease your risk for bleeding  If you need a refill on your cardiac medications before your next appointment, please call your pharmacy.   Lab work: TODAY: BMET, CBC, BNP  If you have labs (blood work) drawn today and your tests are completely normal, you will receive your results only by: Marland Kitchen MyChart Message (if you have MyChart) OR . A paper copy in the mail If you have any lab test that is abnormal or we need to change your treatment, we will call you to review the results.  Testing/Procedures: Your physician has requested that you have an echocardiogram TODAY at 4:00 PM, arrive at 3:30 PM. Echocardiography is a painless test that uses sound waves to create images of your heart. It provides your doctor with information about the size and shape of your heart and how well your heart's chambers and valves are working. This procedure takes approximately one hour. There are no restrictions for this procedure.  Follow-Up: . Follow up with Dr. Angelena Form on 12/01/18 at 3:20 PM  Any Other Special Instructions Will Be Listed Below (If Applicable).   Atrial Fibrillation Atrial fibrillation is a type of heartbeat that is irregular or fast (rapid). If you have this condition, your heart keeps quivering in a weird (chaotic) way. This condition can make it so your heart cannot pump blood normally. Having this condition gives a person more risk for stroke, heart failure, and other heart problems. There are different types of atrial fibrillation. Talk with your doctor to learn about the type that you have. Follow these instructions at home:  Take over-the-counter and prescription medicines only as told by your doctor.  If your doctor prescribed a  blood-thinning medicine, take it exactly as told. Taking too much of it can cause bleeding. If you do not take enough of it, you will not have the protection that you need against stroke and other problems.  Do not use any tobacco products. These include cigarettes, chewing tobacco, and e-cigarettes. If you need help quitting, ask your doctor.  If you have apnea (obstructive sleep apnea), manage it as told by your doctor.  Do not drink alcohol.  Do not drink beverages that have caffeine. These include coffee, soda, and tea.  Maintain a healthy weight. Do not use diet pills unless your doctor says they are safe for you. Diet pills may make heart problems worse.  Follow diet instructions as told by your doctor.  Exercise regularly as told by your doctor.  Keep all follow-up visits as told by your doctor. This is important. Contact a doctor if:  You notice a change in the speed, rhythm, or strength of your heartbeat.  You are taking a blood-thinning medicine and you notice more bruising.  You get tired more easily when you move or exercise. Get help right away if:  You have pain in your chest or your belly (abdomen).  You have sweating or weakness.  You feel sick to your stomach (nauseous).  You notice blood in your throw up (vomit), poop (stool), or pee (urine).  You are short of breath.  You suddenly have swollen feet and ankles.  You feel dizzy.  Your suddenly get weak or numb in your face, arms, or legs, especially if it  happens on one side of your body.  You have trouble talking, trouble understanding, or both.  Your face or your eyelid droops on one side. These symptoms may be an emergency. Do not wait to see if the symptoms will go away. Get medical help right away. Call your local emergency services (911 in the [place] Do not drive yourself to the hospital. This information is not intended to replace advice given to you by your health care provider. Make sure you  discuss any questions you have with your health care provider. Document Released: 08/18/2008 Document Revised: 04/16/2016 Document Reviewed: 03/06/2015 Elsevier Interactive Patient Education  2018 [employer].  Apixaban oral tablets What is this medicine? APIXABAN (a PIX a ban) is an anticoagulant (blood thinner). It is used to lower the chance of stroke in people with a medical condition called atrial fibrillation. It is also used to treat or prevent blood clots in the lungs or in the veins. This medicine may be used for other purposes; ask your health care provider or pharmacist if you have questions. COMMON BRAND NAME(S): Eliquis What should I tell my health care provider before I take this medicine? They need to know if you have any of these conditions: -bleeding disorders -bleeding in the brain -blood in your stools (black or tarry stools) or if you have blood in your vomit -history of stomach bleeding -kidney disease -liver disease -mechanical heart valve -an unusual or allergic reaction to apixaban, other medicines, foods, dyes, or preservatives -pregnant or trying to get pregnant -breast-feeding How should I use this medicine? Take this medicine by mouth with a glass of water. Follow the directions on the prescription label. You can take it with or without food. If it upsets your stomach, take it with food. Take your medicine at regular intervals. Do not take it more often than directed. Do not stop taking except on your doctor's advice. Stopping this medicine may increase your risk of a blot clot. Be sure to refill your prescription before you run out of medicine. Talk to your pediatrician regarding the use of this medicine in children. Special care may be needed. Overdosage: If you think you have taken too much of this medicine contact a poison control center or emergency room at once. NOTE: This medicine is only for you. Do not share this medicine with others. What if I miss a  dose? If you miss a dose, take it as soon as you can. If it is almost time for your next dose, take only that dose. Do not take double or extra doses. What may interact with this medicine? This medicine may interact with the following: -aspirin and aspirin-like medicines -certain medicines for fungal infections like ketoconazole and itraconazole -certain medicines for seizures like carbamazepine and phenytoin -certain medicines that treat or prevent blood clots like warfarin, enoxaparin, and dalteparin -clarithromycin -NSAIDs, medicines for pain and inflammation, like ibuprofen or naproxen -rifampin -ritonavir -St. John's wort This list may not describe all possible interactions. Give your health care provider a list of all the medicines, herbs, non-prescription drugs, or dietary supplements you use. Also tell them if you smoke, drink alcohol, or use illegal drugs. Some items may interact with your medicine. What should I watch for while using this medicine? Visit your doctor or health care professional for regular checks on your progress. Notify your doctor or health care professional and seek emergency treatment if you develop breathing problems; changes in vision; chest pain; severe, sudden headache; pain, swelling, warmth  in the leg; trouble speaking; sudden numbness or weakness of the face, arm or leg. These can be signs that your condition has gotten worse. If you are going to have surgery or other procedure, tell your doctor that you are taking this medicine. What side effects may I notice from receiving this medicine? Side effects that you should report to your doctor or health care professional as soon as possible: -allergic reactions like skin rash, itching or hives, swelling of the face, lips, or tongue -signs and symptoms of bleeding such as bloody or black, tarry stools; red or dark-brown urine; spitting up blood or brown material that looks like coffee grounds; red spots on the  skin; unusual bruising or bleeding from the eye, gums, or nose This list may not describe all possible side effects. Call your doctor for medical advice about side effects. You may report side effects to FDA at 1-800-FDA-1088. Where should I keep my medicine? Keep out of the reach of children. Store at room temperature between 20 and 25 degrees C (68 and 77 degrees F). Throw away any unused medicine after the expiration date. NOTE: This sheet is a summary. It may not cover all possible information. If you have questions about this medicine, talk to your doctor, pharmacist, or health care provider.  2018 Elsevier/Gold Standard (2016-06-01 11:54:23)  Electrical Cardioversion Electrical cardioversion is the delivery of a jolt of electricity to restore a normal rhythm to the heart. A rhythm that is too fast or is not regular keeps the heart from pumping well. In this procedure, sticky patches or metal paddles are placed on the chest to deliver electricity to the heart from a device. This procedure may be done in an emergency if:  There is low or no blood pressure as a result of the heart rhythm.  Normal rhythm must be restored as fast as possible to protect the brain and heart from further damage.  It may save a life.  This procedure may also be done for irregular or fast heart rhythms that are not immediately life-threatening. Tell a health care provider about:  Any allergies you have.  All medicines you are taking, including vitamins, herbs, eye drops, creams, and over-the-counter medicines.  Any problems you or family members have had with anesthetic medicines.  Any blood disorders you have.  Any surgeries you have had.  Any medical conditions you have.  Whether you are pregnant or may be pregnant. What are the risks? Generally, this is a safe procedure. However, problems may occur, including:  Allergic reactions to medicines.  A blood clot that breaks free and travels to other  parts of your body.  The possible return of an abnormal heart rhythm within hours or days after the procedure.  Your heart stopping (cardiac arrest). This is rare.  What happens before the procedure? Medicines  Your health care provider may have you start taking: ? Blood-thinning medicines (anticoagulants) so your blood does not clot as easily. ? Medicines may be given to help stabilize your heart rate and rhythm.  Ask your health care provider about changing or stopping your regular medicines. This is especially important if you are taking diabetes medicines or blood thinners. General instructions  Plan to have someone take you home from the hospital or clinic.  If you will be going home right after the procedure, plan to have someone with you for 24 hours.  Follow instructions from your health care provider about eating or drinking restrictions. What happens during the  procedure?  To lower your risk of infection: ? Your health care team will wash or sanitize their hands. ? Your skin will be washed with soap.  An IV tube will be inserted into one of your veins.  You will be given a medicine to help you relax (sedative).  Sticky patches (electrodes) or metal paddles may be placed on your chest.  An electrical shock will be delivered. The procedure may vary among health care providers and hospitals. What happens after the procedure?  Your blood pressure, heart rate, breathing rate, and blood oxygen level will be monitored until the medicines you were given have worn off.  Do not drive for 24 hours if you were given a sedative.  Your heart rhythm will be watched to make sure it does not change. This information is not intended to replace advice given to you by your health care provider. Make sure you discuss any questions you have with your health care provider. Document Released: 10/30/2002 Document Revised: 07/08/2016 Document Reviewed: 05/15/2016 Elsevier Interactive  Patient Education  2017 [employer].

## 2018-11-02 ENCOUNTER — Telehealth: Payer: Self-pay

## 2018-11-02 DIAGNOSIS — R0609 Other forms of dyspnea: Principal | ICD-10-CM

## 2018-11-02 DIAGNOSIS — I2510 Atherosclerotic heart disease of native coronary artery without angina pectoris: Secondary | ICD-10-CM

## 2018-11-02 DIAGNOSIS — I4891 Unspecified atrial fibrillation: Secondary | ICD-10-CM

## 2018-11-02 DIAGNOSIS — R0600 Dyspnea, unspecified: Secondary | ICD-10-CM

## 2018-11-02 LAB — CBC
HEMOGLOBIN: 14.3 g/dL (ref 13.0–17.7)
Hematocrit: 42.2 % (ref 37.5–51.0)
MCH: 32.1 pg (ref 26.6–33.0)
MCHC: 33.9 g/dL (ref 31.5–35.7)
MCV: 95 fL (ref 79–97)
Platelets: 104 10*3/uL — ABNORMAL LOW (ref 150–450)
RBC: 4.46 x10E6/uL (ref 4.14–5.80)
RDW: 12.6 % (ref 12.3–15.4)
WBC: 4 10*3/uL (ref 3.4–10.8)

## 2018-11-02 LAB — BASIC METABOLIC PANEL
BUN / CREAT RATIO: 11 (ref 10–24)
BUN: 12 mg/dL (ref 8–27)
CO2: 22 mmol/L (ref 20–29)
CREATININE: 1.12 mg/dL (ref 0.76–1.27)
Calcium: 9.2 mg/dL (ref 8.6–10.2)
Chloride: 102 mmol/L (ref 96–106)
GFR calc Af Amer: 77 mL/min/{1.73_m2} (ref >59)
GFR calc non Af Amer: 66 mL/min/{1.73_m2} (ref >59)
GLUCOSE: 98 mg/dL (ref 65–99)
POTASSIUM: 4.3 mmol/L (ref 3.5–5.2)
SODIUM: 141 mmol/L (ref 134–144)

## 2018-11-02 LAB — PRO B NATRIURETIC PEPTIDE: NT-PRO BNP: 3472 pg/mL — AB (ref 0–376)

## 2018-11-02 MED ORDER — FUROSEMIDE 40 MG PO TABS: 40.0000 mg | ORAL_TABLET | Freq: Every day | ORAL | 3 refills | Status: DC

## 2018-11-02 MED ORDER — POTASSIUM CHLORIDE CRYS ER 20 MEQ PO TBCR: 20.0000 meq | EXTENDED_RELEASE_TABLET | Freq: Every day | ORAL | 3 refills | Status: DC

## 2018-11-02 NOTE — Telephone Encounter (Signed)
-----   Message from Imogene Burn, PA-C sent at 11/02/2018  7:59 AM EST ----- Heart function normal with some atrial enlargement

## 2018-11-02 NOTE — Telephone Encounter (Signed)
Called and made patient aware of lab results and echo results. Made patient aware that he needs to start lasix 40 mg QD and k-dur 20 mEq QD and repeat BMET in 1 week. Made patient aware that he will need to follow up with Ermalinda Barrios, PA in 1 week or so as well. Appointments made for patient to see ML and have BMET done on 12/18. Patient verbalized understanding and thanked me for the call. Rxs sent to preferred pharmacy.

## 2018-11-02 NOTE — Telephone Encounter (Signed)
-----   Message from Imogene Burn, PA-C sent at 11/02/2018  7:58 AM EST ----- BNP elevated which is a heart failure marker. Please call in lasxi 40 mg once daily & kdur 20 meq once daily. Repeat bmet in 1 week. Heart function normal on echo with some atrial enlargement. Continue current treatment. F/u with me next week or so

## 2018-11-07 ENCOUNTER — Ambulatory Visit (INDEPENDENT_AMBULATORY_CARE_PROVIDER_SITE_OTHER): Payer: Medicare HMO | Admitting: Adult Health

## 2018-11-07 ENCOUNTER — Encounter: Payer: Self-pay | Admitting: Adult Health

## 2018-11-07 VITALS — BP 90/60 | Temp 98.3°F | Wt 163.0 lb

## 2018-11-07 DIAGNOSIS — E7800 Pure hypercholesterolemia, unspecified: Secondary | ICD-10-CM | POA: Diagnosis not present

## 2018-11-07 DIAGNOSIS — M069 Rheumatoid arthritis, unspecified: Secondary | ICD-10-CM | POA: Diagnosis not present

## 2018-11-07 DIAGNOSIS — I4819 Other persistent atrial fibrillation: Secondary | ICD-10-CM | POA: Diagnosis not present

## 2018-11-07 DIAGNOSIS — Z7689 Persons encountering health services in other specified circumstances: Secondary | ICD-10-CM | POA: Diagnosis not present

## 2018-11-07 DIAGNOSIS — I4891 Unspecified atrial fibrillation: Secondary | ICD-10-CM | POA: Insufficient documentation

## 2018-11-07 DIAGNOSIS — I10 Essential (primary) hypertension: Secondary | ICD-10-CM

## 2018-11-07 DIAGNOSIS — J849 Interstitial pulmonary disease, unspecified: Secondary | ICD-10-CM | POA: Diagnosis not present

## 2018-11-07 NOTE — Progress Notes (Signed)
Patient presents to clinic today to establish care. He is a pleasant 72 year old male who  has a past medical history of Arthritis, Cerebral cavernoma, Hyperlipidemia, Hypertension, and Rheumatoid arthritis ([place]).  He is a former patient of Dr. Sherren Mocha. His last CPE was in 02/2018   Acute Concerns: Establish Care  Chronic Issues: Hyperlipidemia -Currently prescribed simvastatin 20 mg tablets Lab Results  Component Value Date   CHOL 154 03/09/2018   HDL 49.40 03/09/2018   LDLCALC 85 03/09/2018   LDLDIRECT 126.5 01/18/2013   TRIG 97.0 03/09/2018   CHOLHDL 3 03/09/2018   Hypertension -Takes Atenolol 75 mg daily and Lasix 20 mg daily.  BP Readings from Last 3 Encounters:  11/07/18 90/60  11/01/18 132/86  09/08/18 130/82   Rheumatoid Arthritis - is followed by Rheumatology( Dr. Amil Amen)  and is prescribed Humira.   A-Fib - New onset, diagnosed 11/01/2018 by cardiology.  Likely been ongoing 3 weeks prior to that appointment.  Also having worse worsening dyspnea on exertion with little activity as well as dizziness.  He was started on Eliquis 5 mg twice daily.  We will have elective cardioversion in less than a month.   2D echo on 11/01/2018 showed: - LVEF 60-65%, normal wall thickness, incoordinate septal motion,trivial MR, moderate biatrial enlargement, moderate TR, RVSP 70 mmHg, dilated IVC.  ILD - was followed by Pulmonary.  Health Maintenance: Dental -- Routine Care Vision -- Does not do routine care Immunizations -- utd  Colonoscopy -- utd    Past Medical History:  Diagnosis Date  . Arthritis   . Cerebral cavernoma   . Hyperlipidemia   . Hypertension   . Rheumatoid arthritis [facility])     Past Surgical History:  Procedure Laterality Date  . COLONOSCOPY    . LIPOMA EXCISION    . PALATE / UVULA BIOPSY / EXCISION    . TONSILLECTOMY     age 63    Current Outpatient Medications on File Prior to Visit  Medication Sig Dispense Refill  . Adalimumab (HUMIRA) 40  MG/0.8ML PSKT Inject into the skin. Every other week    . apixaban (ELIQUIS) 5 MG TABS tablet Take 1 tablet (5 mg total) by mouth 2 (two) times daily. 60 tablet 11  . atenolol (TENORMIN) 50 MG tablet Take 1.5 tablets (75 mg total) by mouth daily. 135 tablet 1  . furosemide (LASIX) 40 MG tablet Take 1 tablet (40 mg total) by mouth daily. 90 tablet 3  . potassium chloride SA (K-DUR,KLOR-CON) 20 MEQ tablet Take 1 tablet (20 mEq total) by mouth daily. 90 tablet 3  . simvastatin (ZOCOR) 20 MG tablet Take 1 tablet (20 mg total) by mouth at bedtime. 100 tablet 4   No current facility-administered medications on file prior to visit.     Allergies  Allergen Reactions  . Atorvastatin     REACTION: hives  . Cetirizine Hcl     REACTION: hives    Family History  Problem Relation Age of Onset  . Colon cancer Mother 14       57  . Hepatitis C Father   . CAD Father     Social History   Socioeconomic History  . Marital status: Divorced    Spouse name: Not on file  . Number of children: Not on file  . Years of education: Not on file  . Highest education level: Not on file  Occupational History  . Occupation: Retired-[employer]  . Financial resource strain:  Not on file  . Food insecurity:    Worry: Not on file    Inability: Not on file  . Transportation needs:    Medical: Not on file    Non-medical: Not on file  Tobacco Use  . Smoking status: Never Smoker  . Smokeless tobacco: Never Used  Substance and Sexual Activity  . Alcohol use: Yes    Alcohol/week: 14.0 standard drinks    Types: 14 Cans of beer per week  . Drug use: No  . Sexual activity: Not on file  Lifestyle  . Physical activity:    Days per week: Not on file    Minutes per session: Not on file  . Stress: Not on file  Relationships  . Social connections:    Talks on phone: Not on file    Gets together: Not on file    Attends religious service: Not on file    Active member of club or organization: Not  on file    Attends meetings of clubs or organizations: Not on file    Relationship status: Not on file  . Intimate partner violence:    Fear of current or ex partner: Not on file    Emotionally abused: Not on file    Physically abused: Not on file    Forced sexual activity: Not on file  Other Topics Concern  . Not on file  Social History Narrative  . Not on file    Review of Systems  Constitutional: Negative.   HENT: Negative.   Eyes: Negative.   Respiratory: Negative.   Cardiovascular: Negative.   Genitourinary: Negative.   Musculoskeletal: Negative.   Skin: Negative.   Neurological: Negative.   Psychiatric/Behavioral: Negative.     BP 90/60   Temp 98.3 F (36.8 C)   Wt 163 lb (73.9 kg)   BMI 25.53 kg/m   Physical Exam Vitals signs reviewed.  Constitutional:      Appearance: Normal appearance.  HENT:     Head: Normocephalic and atraumatic.     Mouth/Throat:     Mouth: Mucous membranes are moist.     Pharynx: Oropharynx is clear.  Eyes:     Extraocular Movements: Extraocular movements intact.     Pupils: Pupils are equal, round, and reactive to light.  Cardiovascular:     Rate and Rhythm: Normal rate and regular rhythm.     Pulses: Normal pulses.  Pulmonary:     Effort: Pulmonary effort is normal.     Breath sounds: Normal breath sounds.  Skin:    General: Skin is warm and dry.     Capillary Refill: Capillary refill takes less than 2 seconds.  Neurological:     General: No focal deficit present.     Mental Status: He is alert and oriented to person, place, and time. Mental status is at baseline.     Cranial Nerves: Cranial nerves are intact.     Motor: Tremor (LUE> RUE) present.  Psychiatric:        Mood and Affect: Mood normal.        Behavior: Behavior is cooperative.        Thought Content: Thought content normal.        Judgment: Judgment normal.     Recent Results (from the past 2160 hour(s))  Basic metabolic panel     Status: None    Collection Time: 11/01/18 10:46 AM  Result Value Ref Range   Glucose 98 65 - 99 mg/dL   BUN 12  8 - 27 mg/dL   Creatinine, Ser 1.12 0.76 - 1.27 mg/dL   GFR calc non Af Amer 66 >59 mL/min/1.73   GFR calc Af Amer 77 >59 mL/min/1.73   BUN/Creatinine Ratio 11 10 - 24   Sodium 141 134 - 144 mmol/L   Potassium 4.3 3.5 - 5.2 mmol/L   Chloride 102 96 - 106 mmol/L   CO2 22 20 - 29 mmol/L   Calcium 9.2 8.6 - 10.2 mg/dL  CBC     Status: Abnormal   Collection Time: 11/01/18 10:46 AM  Result Value Ref Range   WBC 4.0 3.4 - 10.8 x10E3/uL   RBC 4.46 4.14 - 5.80 x10E6/uL   Hemoglobin 14.3 13.0 - 17.7 g/dL   Hematocrit 42.2 37.5 - 51.0 %   MCV 95 79 - 97 fL   MCH 32.1 26.6 - 33.0 pg   MCHC 33.9 31.5 - 35.7 g/dL   RDW 12.6 12.3 - 15.4 %    Comment: **Effective November 28, 2018, the RDW pediatric reference**   interval will be removed and the adult reference interval   will be changing to:                             Male 11.7 - 15.4                                                      Male 11.6 - 15.4    Platelets 104 (L) 150 - 450 x10E3/uL   Hematology Comments: Note:     Comment: Verified by microscopic examination.  Pro b natriuretic peptide (BNP)     Status: Abnormal   Collection Time: 11/01/18 10:46 AM  Result Value Ref Range   NT-Pro BNP 3,472 (H) 0 - 376 pg/mL    Comment: The following cut-points have been suggested for the use of proBNP for the diagnostic evaluation of heart failure (HF) in patients with acute dyspnea: Modality                     Age           Optimal Cut                            (years)            Point ------------------------------------------------------ Diagnosis (rule in HF)        <50            450 pg/mL                           50 - 75            900 pg/mL                               >75           1800 pg/mL Exclusion (rule out HF)  Age independent     300 pg/mL   ECHOCARDIOGRAM COMPLETE     Status: None   Collection Time: 11/01/18  4:29 PM    Result Value Ref Range   Weight 2,713.6 oz   Height 67.000  in   BP 132/86 mmHg    Assessment/Plan: 1. Encounter to establish care - Follow up in April 2020 for CPE or sooner if needed  2. Pure hypercholesterolemia - Continue with statin   3. ILD (interstitial lung disease) ([place]) - Pulmonary plan of care  4. Essential hypertension - asymptomatic hypotension today.  - Will continue to monitor   5. Other persistent atrial fibrillation - Follow up with Cardiology as directed   6. Rheumatoid arthritis, involving unspecified site, unspecified rheumatoid factor presence ([place]) - Continue with Rheumatology plan of care   Dorothyann Peng, NP

## 2018-11-09 ENCOUNTER — Other Ambulatory Visit: Payer: Medicare HMO

## 2018-11-09 ENCOUNTER — Ambulatory Visit: Payer: Medicare HMO | Admitting: Physician Assistant

## 2018-11-09 ENCOUNTER — Encounter: Payer: Self-pay | Admitting: Physician Assistant

## 2018-11-09 VITALS — BP 116/72 | HR 77 | Ht 67.0 in | Wt 165.0 lb

## 2018-11-09 DIAGNOSIS — I2510 Atherosclerotic heart disease of native coronary artery without angina pectoris: Secondary | ICD-10-CM

## 2018-11-09 DIAGNOSIS — R0609 Other forms of dyspnea: Principal | ICD-10-CM

## 2018-11-09 DIAGNOSIS — I5032 Chronic diastolic (congestive) heart failure: Secondary | ICD-10-CM | POA: Diagnosis not present

## 2018-11-09 DIAGNOSIS — I10 Essential (primary) hypertension: Secondary | ICD-10-CM | POA: Diagnosis not present

## 2018-11-09 DIAGNOSIS — I480 Paroxysmal atrial fibrillation: Secondary | ICD-10-CM

## 2018-11-09 DIAGNOSIS — I4891 Unspecified atrial fibrillation: Secondary | ICD-10-CM

## 2018-11-09 DIAGNOSIS — R0600 Dyspnea, unspecified: Secondary | ICD-10-CM

## 2018-11-09 LAB — BASIC METABOLIC PANEL
BUN/Creatinine Ratio: 11 (ref 10–24)
BUN: 15 mg/dL (ref 8–27)
CALCIUM: 9.4 mg/dL (ref 8.6–10.2)
CO2: 25 mmol/L (ref 20–29)
Chloride: 99 mmol/L (ref 96–106)
Creatinine, Ser: 1.37 mg/dL — ABNORMAL HIGH (ref 0.76–1.27)
GFR calc non Af Amer: 52 mL/min/{1.73_m2} — ABNORMAL LOW (ref >59)
GFR, EST AFRICAN AMERICAN: 60 mL/min/{1.73_m2} (ref >59)
Glucose: 80 mg/dL (ref 65–99)
Potassium: 4.9 mmol/L (ref 3.5–5.2)
Sodium: 138 mmol/L (ref 134–144)

## 2018-11-09 LAB — PRO B NATRIURETIC PEPTIDE: NT-Pro BNP: 1925 pg/mL — ABNORMAL HIGH (ref 0–376)

## 2018-11-09 NOTE — Progress Notes (Signed)
Cardiology Office Note    Date:  11/09/2018   ID:  Barry Woods, DOB 26-Apr-1946, MRN [id number]  PCP:  Dorothyann Peng, NP  Cardiologist: Lauree Chandler, MD EPS: None  Chief Complaint  Patient presents with  . Follow-up    History of Present Illness:  Barry Woods is a 72 y.o. male with history of hypertension, HLD, rheumatoid arthritis, coronary calcification on chest CT scan 09/2016.  Nuclear stress test 04/08/2017 normal with no ischemia normal LV function.  Patient stopped drinking alcohol 03/2018 and had a seizure.  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   I saw patient 11/01/2018 for worsening dyspnea on exertion and dizziness and was found to be in new onset atrial fibrillation.  O2 sats dropped to 91% pretty quickly when walking him around the office.  Heart rate remained controlled on Tenormin.  I added Eliquis 5 mg twice daily.  BNP was 3472 renal function normal.  I started him on Lasix 40 mg daily and K-Dur 20 mEq daily.  2D echo showed normal LV function EF 60 to 65% with moderate bilateral atrial enlargement moderate TR RVSP 70 mmHg dilated IVC.  Patient comes in today for follow-up.  Overall he is feeling better.  He can walk 100 feet without stopping and getting short of breath but still has some dyspnea on exertion.  Has lost 4 pounds on our scales.  Looking back he says when this all started he was "partying pretty heavily" and was also on steroids for shoulder pain.  Currently drinking about 2 beers a day and a glass of wine.  He has missed 1 Eliquis dose 3 days ago which would have been 11/06/2018.  BP dropped on Monday to 90/60 but is back up to normal today.   Past Medical History:  Diagnosis Date  . Arthritis   . Cerebral cavernoma   . Hyperlipidemia   . Hypertension   . Rheumatoid arthritis [facility])     Past Surgical History:  Procedure Laterality Date  . COLONOSCOPY    . LIPOMA EXCISION    . PALATE /  UVULA BIOPSY / EXCISION    . TONSILLECTOMY     age 76    Current Medications: Current Meds  Medication Sig  . Adalimumab (HUMIRA) 40 MG/0.8ML PSKT Inject into the skin. Every other week  . apixaban (ELIQUIS) 5 MG TABS tablet Take 1 tablet (5 mg total) by mouth 2 (two) times daily.  Marland Kitchen atenolol (TENORMIN) 50 MG tablet Take 1.5 tablets (75 mg total) by mouth daily.  . furosemide (LASIX) 40 MG tablet Take 1 tablet (40 mg total) by mouth daily.  . potassium chloride SA (K-DUR,KLOR-CON) 20 MEQ tablet Take 1 tablet (20 mEq total) by mouth daily.  . simvastatin (ZOCOR) 20 MG tablet Take 1 tablet (20 mg total) by mouth at bedtime.     Allergies:   Atorvastatin and Cetirizine hcl   Social History   Socioeconomic History  . Marital status: Divorced    Spouse name: Not on file  . Number of children: Not on file  . Years of education: Not on file  . Highest education level: Not on file  Occupational History  . Occupation: Retired-[employer]  . Financial resource strain: Not on file  . Food insecurity:    Worry: Not on file    Inability: Not on file  . Transportation needs:    Medical: Not on file  Non-medical: Not on file  Tobacco Use  . Smoking status: Never Smoker  . Smokeless tobacco: Never Used  Substance and Sexual Activity  . Alcohol use: Yes    Alcohol/week: 14.0 standard drinks    Types: 14 Cans of beer per week  . Drug use: No  . Sexual activity: Not on file  Lifestyle  . Physical activity:    Days per week: Not on file    Minutes per session: Not on file  . Stress: Not on file  Relationships  . Social connections:    Talks on phone: Not on file    Gets together: Not on file    Attends religious service: Not on file    Active member of club or organization: Not on file    Attends meetings of clubs or organizations: Not on file    Relationship status: Not on file  Other Topics Concern  . Not on file  Social History Narrative  . Not on file      Family History:  The patient's family history includes CAD in his father; Colon cancer (age of onset: 61) in his mother; Hepatitis C in his father.   ROS:   Please see the history of present illness.    Review of Systems  Constitution: Negative.  HENT: Negative.   Cardiovascular: Positive for dyspnea on exertion.  Respiratory: Negative.   Endocrine: Negative.   Hematologic/Lymphatic: Negative.   Musculoskeletal: Negative.   Gastrointestinal: Negative.   Genitourinary: Negative.   Neurological: Positive for tremors.   All other systems reviewed and are negative.   PHYSICAL EXAM:   VS:  BP 116/72   Pulse 77   Ht 5\' 7"  (1.702 m)   Wt 165 lb (74.8 kg)   SpO2 96%   BMI 25.84 kg/m   Physical Exam  GEN: Well nourished, well developed, in no acute distress  Neck: Increased JVD, no carotid bruits, or masses Cardiac: Irregular irregular no murmurs, rubs, or gallops  Respiratory:  clear to auscultation bilaterally, normal work of breathing GI: soft, nontender, nondistended, + BS Ext: without cyanosis, clubbing, or edema, Good distal pulses bilaterally Neuro:  Alert and Oriented x 3 Psych: euthymic mood, full affect  Wt Readings from Last 3 Encounters:  11/09/18 165 lb (74.8 kg)  11/07/18 163 lb (73.9 kg)  11/01/18 169 lb 9.6 oz (76.9 kg)      Studies/Labs Reviewed:   EKG:  EKG is not ordered today.   Recent Labs: 03/09/2018: TSH 2.75 05/11/2018: ALT 47 11/01/2018: BUN 12; Creatinine, Ser 1.12; Hemoglobin 14.3; NT-Pro BNP 3,472; Platelets 104; Potassium 4.3; Sodium 141   Lipid Panel    Component Value Date/Time   CHOL 154 03/09/2018 1140   TRIG 97.0 03/09/2018 1140   TRIG 150 (H) 09/28/2006 0957   HDL 49.40 03/09/2018 1140   CHOLHDL 3 03/09/2018 1140   VLDL 19.4 03/09/2018 1140   LDLCALC 85 03/09/2018 1140   LDLDIRECT 126.5 01/18/2013 0807    Additional studies/ records that were reviewed today include:  2D echo 11/01/2018  study Conclusions   - Left  ventricle: The cavity size was normal. Wall thickness was   normal. Systolic function was normal. The estimated ejection   fraction was in the range of 60% to 65%. Incoordinate septal   motion. The study is not technically sufficient to allow   evaluation of LV diastolic function. - Mitral valve: Mildly thickened leaflets . There was trivial   regurgitation. - Left atrium: Moderately dilated. - Right  ventricle: The cavity size was mildly dilated. - Right atrium: Moderately dilated. - Tricuspid valve: There was moderate regurgitation. - Pulmonary arteries: PA peak pressure: 70 mm Hg (S). - Inferior vena cava: The vessel was dilated. The respirophasic   diameter changes were blunted (< 50%), consistent with elevated   central venous pressure.   Impressions:   - LVEF 60-65%, normal wall thickness, incoordinate septal motion,   trivial MR, moderate biatrial enlargement, moderate TR, RVSP 70   mmHg, dilated IVC.       ASSESSMENT:    1. Paroxysmal atrial fibrillation (HCC)   2. Diastolic CHF, chronic ([place])   3. Essential hypertension   4. Coronary artery calcification seen on CT scan      PLAN:  In order of problems listed above:  PAF new onset started on Eliquis last week.  Rate was controlled on Tenormin.  Patient was not tolerating it well and did have heart failure which has improved.  He missed an Eliquis dose 11/06/2018.Drinking 2 beers and a glass of wine daily. Have asked him to cut back. Was also on steroids when this first started. Keep f/u with Dr. Angelena Form 12/01/18 and if he doesn't miss anymore Eliquis should be ok to schedule DCCV.  Diastolic CHF 2D echo checked and had normal LV function with increased RVSP of 70 mmHg.  BNP was elevated at over 3000.  Started on Lasix and potassium-has diuresed some but neck veins still elevated.  Continue current dose Lasix and potassium.  Check renal function today.  If blood pressure drops he may need to cut back to Lasix 20 mg daily  potassium 10 mEq daily.  Patient will monitor at home.  Essential hypertension blood pressure stable today but did drop to 90/60 on Monday.  Patient will keep monitoring this at home.  Coronary calcification on CT 09/2016, normal nuclear stress test 03/2017 no angina    Medication Adjustments/Labs and Tests Ordered: Current medicines are reviewed at length with the patient today.  Concerns regarding medicines are outlined above.  Medication changes, Labs and Tests ordered today are listed in the Patient Instructions below. Patient Instructions  Medication Instructions:  Your physician recommends that you continue on your current medications as directed. Please refer to the Current Medication list given to you today.  If you need a refill on your cardiac medications before your next appointment, please call your pharmacy.   Lab work: TODAY: BMET, BNP  If you have labs (blood work) drawn today and your tests are completely normal, you will receive your results only by: Marland Kitchen MyChart Message (if you have MyChart) OR . A paper copy in the mail If you have any lab test that is abnormal or we need to change your treatment, we will call you to review the results.  Testing/Procedures: None ordered  Follow-Up: . Keep appointment with Dr. Angelena Form in January  Any Other Special Instructions Will Be Listed Below (If Applicable).  Decrease alcohol intake     Signed, Ermalinda Barrios, PA-C  11/09/2018 11:20 AM    [facility] [address], [place], [place]  [address] Phone: [phone]; Fax: [phone]

## 2018-11-09 NOTE — Patient Instructions (Addendum)
Medication Instructions:  Your physician recommends that you continue on your current medications as directed. Please refer to the Current Medication list given to you today.  If you need a refill on your cardiac medications before your next appointment, please call your pharmacy.   Lab work: TODAY: BMET, BNP  If you have labs (blood work) drawn today and your tests are completely normal, you will receive your results only by: Marland Kitchen MyChart Message (if you have MyChart) OR . A paper copy in the mail If you have any lab test that is abnormal or we need to change your treatment, we will call you to review the results.  Testing/Procedures: None ordered  Follow-Up: . Keep appointment with Dr. Angelena Form in January  Any Other Special Instructions Will Be Listed Below (If Applicable).  Decrease alcohol intake

## 2018-11-10 ENCOUNTER — Telehealth: Payer: Self-pay

## 2018-11-10 DIAGNOSIS — I5032 Chronic diastolic (congestive) heart failure: Secondary | ICD-10-CM

## 2018-11-10 MED ORDER — FUROSEMIDE 40 MG PO TABS: 20.0000 mg | ORAL_TABLET | Freq: Every day | ORAL | 3 refills | Status: DC

## 2018-11-10 MED ORDER — POTASSIUM CHLORIDE CRYS ER 20 MEQ PO TBCR: 10.0000 meq | EXTENDED_RELEASE_TABLET | Freq: Every day | ORAL | 3 refills | Status: DC

## 2018-11-10 NOTE — Telephone Encounter (Signed)
-----   Message from Imogene Burn, PA-C sent at 11/10/2018  3:27 PM EST ----- Kidney function went up a little. Try to decrease lasix 20 mg daily, Kdur 20 meq 1/2 daily. If he needs an extra lasix for worsening shortness of breath every couple of days that would be ok. Repeat bmet when he sees Dr. Angelena Form

## 2018-11-10 NOTE — Telephone Encounter (Signed)
Patient made aware of lab results and recommendations to decrease lasix to 20 mg daily and decrease Kdur 20 meq tablet to a 1/2 tablet daily. Made patient aware that if he needs an extra lasix for worsening shortness of breath every couple of days that would be fine. Patient will repeat BMET on 1/9. Patient verbalized understanding and thanked me for the call.

## 2018-11-27 ENCOUNTER — Inpatient Hospital Stay (HOSPITAL_COMMUNITY): Payer: Medicare HMO

## 2018-11-27 ENCOUNTER — Other Ambulatory Visit: Payer: Self-pay

## 2018-11-27 ENCOUNTER — Emergency Department (HOSPITAL_COMMUNITY): Payer: Medicare HMO

## 2018-11-27 ENCOUNTER — Inpatient Hospital Stay (HOSPITAL_COMMUNITY)
Admission: EM | Admit: 2018-11-27 | Discharge: 2018-12-01 | DRG: 064 | Disposition: A | Discharge status: Rehabilitation Facility | Payer: Medicare HMO | Attending: Neurology | Admitting: Neurology

## 2018-11-27 DIAGNOSIS — Q283 Other malformations of cerebral vessels: Secondary | ICD-10-CM

## 2018-11-27 DIAGNOSIS — E785 Hyperlipidemia, unspecified: Secondary | ICD-10-CM | POA: Diagnosis not present

## 2018-11-27 DIAGNOSIS — Z888 Allergy status to other drugs, medicaments and biological substances status: Secondary | ICD-10-CM

## 2018-11-27 DIAGNOSIS — G250 Essential tremor: Secondary | ICD-10-CM | POA: Diagnosis present

## 2018-11-27 DIAGNOSIS — R531 Weakness: Secondary | ICD-10-CM | POA: Diagnosis not present

## 2018-11-27 DIAGNOSIS — I2510 Atherosclerotic heart disease of native coronary artery without angina pectoris: Secondary | ICD-10-CM | POA: Diagnosis present

## 2018-11-27 DIAGNOSIS — W19XXXA Unspecified fall, initial encounter: Secondary | ICD-10-CM | POA: Diagnosis not present

## 2018-11-27 DIAGNOSIS — M1990 Unspecified osteoarthritis, unspecified site: Secondary | ICD-10-CM | POA: Diagnosis present

## 2018-11-27 DIAGNOSIS — R29707 NIHSS score 7: Secondary | ICD-10-CM | POA: Diagnosis present

## 2018-11-27 DIAGNOSIS — G8194 Hemiplegia, unspecified affecting left nondominant side: Secondary | ICD-10-CM | POA: Diagnosis not present

## 2018-11-27 DIAGNOSIS — Z79899 Other long term (current) drug therapy: Secondary | ICD-10-CM

## 2018-11-27 DIAGNOSIS — I110 Hypertensive heart disease with heart failure: Secondary | ICD-10-CM | POA: Diagnosis present

## 2018-11-27 DIAGNOSIS — E871 Hypo-osmolality and hyponatremia: Secondary | ICD-10-CM | POA: Diagnosis not present

## 2018-11-27 DIAGNOSIS — I10 Essential (primary) hypertension: Secondary | ICD-10-CM | POA: Diagnosis not present

## 2018-11-27 DIAGNOSIS — Z7901 Long term (current) use of anticoagulants: Secondary | ICD-10-CM

## 2018-11-27 DIAGNOSIS — I610 Nontraumatic intracerebral hemorrhage in hemisphere, subcortical: Principal | ICD-10-CM | POA: Diagnosis present

## 2018-11-27 DIAGNOSIS — R209 Unspecified disturbances of skin sensation: Secondary | ICD-10-CM | POA: Diagnosis not present

## 2018-11-27 DIAGNOSIS — N179 Acute kidney failure, unspecified: Secondary | ICD-10-CM

## 2018-11-27 DIAGNOSIS — I4821 Permanent atrial fibrillation: Secondary | ICD-10-CM | POA: Diagnosis not present

## 2018-11-27 DIAGNOSIS — Z8249 Family history of ischemic heart disease and other diseases of the circulatory system: Secondary | ICD-10-CM

## 2018-11-27 DIAGNOSIS — N400 Enlarged prostate without lower urinary tract symptoms: Secondary | ICD-10-CM | POA: Diagnosis present

## 2018-11-27 DIAGNOSIS — R4781 Slurred speech: Secondary | ICD-10-CM | POA: Diagnosis present

## 2018-11-27 DIAGNOSIS — E854 Organ-limited amyloidosis: Secondary | ICD-10-CM | POA: Diagnosis present

## 2018-11-27 DIAGNOSIS — M109 Gout, unspecified: Secondary | ICD-10-CM | POA: Diagnosis present

## 2018-11-27 DIAGNOSIS — I4891 Unspecified atrial fibrillation: Secondary | ICD-10-CM | POA: Diagnosis not present

## 2018-11-27 DIAGNOSIS — D689 Coagulation defect, unspecified: Secondary | ICD-10-CM | POA: Diagnosis present

## 2018-11-27 DIAGNOSIS — Z9181 History of falling: Secondary | ICD-10-CM | POA: Diagnosis not present

## 2018-11-27 DIAGNOSIS — I482 Chronic atrial fibrillation, unspecified: Secondary | ICD-10-CM | POA: Diagnosis present

## 2018-11-27 DIAGNOSIS — I639 Cerebral infarction, unspecified: Secondary | ICD-10-CM | POA: Diagnosis not present

## 2018-11-27 DIAGNOSIS — Z7952 Long term (current) use of systemic steroids: Secondary | ICD-10-CM

## 2018-11-27 DIAGNOSIS — I619 Nontraumatic intracerebral hemorrhage, unspecified: Secondary | ICD-10-CM | POA: Diagnosis present

## 2018-11-27 DIAGNOSIS — M069 Rheumatoid arthritis, unspecified: Secondary | ICD-10-CM | POA: Diagnosis not present

## 2018-11-27 DIAGNOSIS — I480 Paroxysmal atrial fibrillation: Secondary | ICD-10-CM | POA: Diagnosis not present

## 2018-11-27 DIAGNOSIS — Z800 Family history of malignant neoplasm of digestive organs: Secondary | ICD-10-CM

## 2018-11-27 DIAGNOSIS — I680 Cerebral amyloid angiopathy: Secondary | ICD-10-CM | POA: Diagnosis present

## 2018-11-27 DIAGNOSIS — I613 Nontraumatic intracerebral hemorrhage in brain stem: Secondary | ICD-10-CM | POA: Diagnosis not present

## 2018-11-27 DIAGNOSIS — J849 Interstitial pulmonary disease, unspecified: Secondary | ICD-10-CM | POA: Diagnosis present

## 2018-11-27 DIAGNOSIS — I69398 Other sequelae of cerebral infarction: Secondary | ICD-10-CM | POA: Diagnosis not present

## 2018-11-27 DIAGNOSIS — I5032 Chronic diastolic (congestive) heart failure: Secondary | ICD-10-CM | POA: Diagnosis present

## 2018-11-27 DIAGNOSIS — R29810 Facial weakness: Secondary | ICD-10-CM | POA: Diagnosis present

## 2018-11-27 DIAGNOSIS — G936 Cerebral edema: Secondary | ICD-10-CM | POA: Diagnosis not present

## 2018-11-27 DIAGNOSIS — I4819 Other persistent atrial fibrillation: Secondary | ICD-10-CM | POA: Diagnosis not present

## 2018-11-27 DIAGNOSIS — R0682 Tachypnea, not elsewhere classified: Secondary | ICD-10-CM | POA: Diagnosis not present

## 2018-11-27 DIAGNOSIS — I618 Other nontraumatic intracerebral hemorrhage: Secondary | ICD-10-CM | POA: Diagnosis not present

## 2018-11-27 LAB — I-STAT CHEM 8, ED
BUN: 16 mg/dL (ref 8–23)
Calcium, Ion: 1.02 mmol/L — ABNORMAL LOW (ref 1.15–1.40)
Chloride: 100 mmol/L (ref 98–111)
Creatinine, Ser: 1.4 mg/dL — ABNORMAL HIGH (ref 0.61–1.24)
Glucose, Bld: 87 mg/dL (ref 70–99)
HEMATOCRIT: 44 % (ref 39.0–52.0)
Hemoglobin: 15 g/dL (ref 13.0–17.0)
POTASSIUM: 3.6 mmol/L (ref 3.5–5.1)
Sodium: 137 mmol/L (ref 135–145)
TCO2: 25 mmol/L (ref 22–32)

## 2018-11-27 LAB — CBC
HCT: 43.6 % (ref 39.0–52.0)
Hemoglobin: 14.1 g/dL (ref 13.0–17.0)
MCH: 31 pg (ref 26.0–34.0)
MCHC: 32.3 g/dL (ref 30.0–36.0)
MCV: 95.8 fL (ref 80.0–100.0)
Platelets: DECREASED 10*3/uL (ref 150–400)
RBC: 4.55 MIL/uL (ref 4.22–5.81)
RDW: 13.4 % (ref 11.5–15.5)
WBC: 3.9 10*3/uL — AB (ref 4.0–10.5)
nRBC: 0 % (ref 0.0–0.2)

## 2018-11-27 LAB — COMPREHENSIVE METABOLIC PANEL WITH GFR
ALT: 82 U/L — ABNORMAL HIGH (ref 0–44)
AST: 105 U/L — ABNORMAL HIGH (ref 15–41)
Albumin: 3.4 g/dL — ABNORMAL LOW (ref 3.5–5.0)
Alkaline Phosphatase: 48 U/L (ref 38–126)
Anion gap: 9 (ref 5–15)
BUN: 14 mg/dL (ref 8–23)
CO2: 24 mmol/L (ref 22–32)
Calcium: 8.5 mg/dL — ABNORMAL LOW (ref 8.9–10.3)
Chloride: 98 mmol/L (ref 98–111)
Creatinine, Ser: 1.33 mg/dL — ABNORMAL HIGH (ref 0.61–1.24)
GFR calc Af Amer: >60 mL/min
GFR calc non Af Amer: 54 mL/min — ABNORMAL LOW
Glucose, Bld: 90 mg/dL (ref 70–99)
Potassium: 4 mmol/L (ref 3.5–5.1)
Sodium: 131 mmol/L — ABNORMAL LOW (ref 135–145)
Total Bilirubin: 1.3 mg/dL — ABNORMAL HIGH (ref 0.3–1.2)
Total Protein: 8.4 g/dL — ABNORMAL HIGH (ref 6.5–8.1)

## 2018-11-27 LAB — DIFFERENTIAL
Abs Immature Granulocytes: 0.01 K/uL (ref 0.00–0.07)
Basophils Absolute: 0 K/uL (ref 0.0–0.1)
Basophils Relative: 1 %
Eosinophils Absolute: 0.1 K/uL (ref 0.0–0.5)
Eosinophils Relative: 3 %
Immature Granulocytes: 0 %
Lymphocytes Relative: 36 %
Lymphs Abs: 1.4 K/uL (ref 0.7–4.0)
Monocytes Absolute: 0.7 K/uL (ref 0.1–1.0)
Monocytes Relative: 17 %
Neutro Abs: 1.7 K/uL (ref 1.7–7.7)
Neutrophils Relative %: 43 %

## 2018-11-27 LAB — I-STAT TROPONIN, ED: Troponin i, poc: 0.01 ng/mL (ref 0.00–0.08)

## 2018-11-27 LAB — PROTIME-INR
INR: 1.44
PROTHROMBIN TIME: 17.4 s — AB (ref 11.4–15.2)

## 2018-11-27 LAB — APTT: APTT: 45 s — AB (ref 24–36)

## 2018-11-27 MED ORDER — STROKE: EARLY STAGES OF RECOVERY BOOK
Freq: Once | Status: AC
  Administered 2018-11-27: 1
  Filled 2018-11-27: qty 1

## 2018-11-27 MED ORDER — LABETALOL HCL 5 MG/ML IV SOLN
20.0000 mg | Freq: Once | INTRAVENOUS | Status: AC
  Administered 2018-11-27: 20 mg via INTRAVENOUS
  Filled 2018-11-27: qty 4

## 2018-11-27 MED ORDER — SENNOSIDES-DOCUSATE SODIUM 8.6-50 MG PO TABS
1.0000 | ORAL_TABLET | Freq: Two times a day (BID) | ORAL | Status: DC
  Administered 2018-11-29 – 2018-11-30 (×2): 1 via ORAL
  Filled 2018-11-27 (×5): qty 1

## 2018-11-27 MED ORDER — PANTOPRAZOLE SODIUM 40 MG IV SOLR
40.0000 mg | Freq: Every day | INTRAVENOUS | Status: DC
  Administered 2018-11-27 – 2018-11-28 (×2): 40 mg via INTRAVENOUS
  Filled 2018-11-27 (×2): qty 40

## 2018-11-27 MED ORDER — EMPTY CONTAINERS FLEXIBLE MISC
900.0000 mg | Freq: Once | Status: AC
  Administered 2018-11-27: 900 mg via INTRAVENOUS
  Filled 2018-11-27: qty 90

## 2018-11-27 MED ORDER — ACETAMINOPHEN 160 MG/5ML PO SOLN: 650.0000 mg | ORAL | Status: DC | PRN

## 2018-11-27 MED ORDER — ACETAMINOPHEN 325 MG PO TABS
650.0000 mg | ORAL_TABLET | ORAL | Status: DC | PRN
  Filled 2018-11-27: qty 2

## 2018-11-27 MED ORDER — CLEVIDIPINE BUTYRATE 0.5 MG/ML IV EMUL
0.0000 mg/h | INTRAVENOUS | Status: DC
  Administered 2018-11-27: 1 mg/h via INTRAVENOUS
  Administered 2018-11-28: 2 mg/h via INTRAVENOUS
  Administered 2018-11-28: 3 mg/h via INTRAVENOUS
  Filled 2018-11-27 (×6): qty 50

## 2018-11-27 MED ORDER — ACETAMINOPHEN 650 MG RE SUPP
650.0000 mg | RECTAL | Status: DC | PRN
  Filled 2018-11-27: qty 1

## 2018-11-27 MED ORDER — SODIUM CHLORIDE 3 % IV SOLN
INTRAVENOUS | Status: AC
  Administered 2018-11-27 – 2018-11-28 (×2): 50 mL/h via INTRAVENOUS
  Filled 2018-11-27 (×3): qty 500

## 2018-11-27 NOTE — H&P (Addendum)
STROKE H&P  Reason for Consult: Code stroke for left-sided weakness Referring Physician: Dr. Vivi Martens  CC: Left-sided weakness  History is obtained from: Patient, chart  HPI: Barry Woods is a 73 y.o. male past medical history of chronic atrial fibrillation on Eliquis, hypertension, hyperlipidemia, rheumatoid arthritis, was in usual state of health till about 6:15 PM-or 6:18 PM to be precise as reported by him to EMS when he started noticing that his legs dragging on the left.  EMS was called.  Initial assessment was done by an EMS team onsite that found him to be LVO positive on the screen, he was put in the truck transported where they had a rendezvous with another EMS team who brought him in. He continued to be plegic on the left arm for them and paretic on the left leg along with left facial droop and slurred speech. Code stroke was activated to be brought to [facility].  He was seen at the bridge in the ER. Initial examination was less concerning for an LVO but more concerning for a small vessel like stroke and ultimately was found to have a bleed in the basal ganglia on the right.  Of note  - is a patient of Dr Kathyrn Sheriff for cavernomas. MRI from 5/29 showed cavernomas in anterior left frontal lobe and right occipital lesion. Was told to come for repeat CT sometime in January.   ICH score-0  LKW: 6:18 PM on 11/27/2018 tpa given?: no, ICH  ROS: Review of systems was performed and was negative except as documented in the HPI.  Past Medical History:  Diagnosis Date  . Arthritis   . Cerebral cavernoma   . Hyperlipidemia   . Hypertension   . Rheumatoid arthritis ([place])       Family History  Problem Relation Age of Onset  . Colon cancer Mother 56       57  . Hepatitis C Father   . CAD Father    Social History:   reports that he has never smoked. He has never used smokeless tobacco. He reports current alcohol use of about 14.0 standard drinks of alcohol per  week. He reports that he does not use drugs.  Medications No current facility-administered medications for this encounter.   Current Outpatient Medications:  .  Adalimumab (HUMIRA) 40 MG/0.8ML PSKT, Inject into the skin. Every other week, Disp: , Rfl:  .  apixaban (ELIQUIS) 5 MG TABS tablet, Take 1 tablet (5 mg total) by mouth 2 (two) times daily., Disp: 60 tablet, Rfl: 11 .  atenolol (TENORMIN) 50 MG tablet, Take 1.5 tablets (75 mg total) by mouth daily., Disp: 135 tablet, Rfl: 1 .  furosemide (LASIX) 40 MG tablet, Take 0.5 tablets (20 mg total) by mouth daily., Disp: 45 tablet, Rfl: 3 .  potassium chloride SA (K-DUR,KLOR-CON) 20 MEQ tablet, Take 0.5 tablets (10 mEq total) by mouth daily., Disp: 45 tablet, Rfl: 3 .  simvastatin (ZOCOR) 20 MG tablet, Take 1 tablet (20 mg total) by mouth at bedtime., Disp: 100 tablet, Rfl: 4  Exam: Current vital signs: Wt (S) 74.9 kg   BMI 25.86 kg/m  Vital signs in last 24 hours: Weight:  [74.9 kg] 74.9 kg (01/05 2703) Reported systolic blood pressure in the 140s by EMS  GENERAL: Awake, alert in NAD HEENT: - Normocephalic and atraumatic, dry mm, no LN++, no Thyromegally LUNGS - Clear to auscultation bilaterally with no wheezes CV - S1S2 RRR, no m/r/g, equal pulses bilaterally. ABDOMEN - Soft,  nontender, nondistended with normoactive BS Ext: warm, well perfused, intact peripheral pulses, no edema  NEURO:  Mental Status: AA&Ox3  Language: speech is mildly dysarthric.  Naming, repetition, fluency, and comprehension intact. Cranial Nerves: PERRL EOMI, visual fields full, left lower facial weakness, facial sensation intact, hearing intact, tongue/uvula/soft palate midline, normal sternocleidomastoid and trapezius muscle strength. No evidence of tongue atrophy or fibrillations Motor: 1 out of 5 left upper extremity, 4-/5 left lower extremity, right upper and lower extremities full-strength. Tone: is normal and bulk is normal Sensation- Intact to light  touch bilaterally, no extinction Coordination: Finger-to-nose intact on the right, unable to perform on left Gait- deferred  NIHSS 1a Level of Conscious.: 0 1b LOC Questions: 0 1c LOC Commands: 0 2 Best Gaze: 0 3 Visual: 0 4 Facial Palsy:2  5a Motor Arm - left:3  5b Motor Arm - Right: 0 6a Motor Leg - Left: 1 6b Motor Leg - Right: 0 7 Limb Ataxia: 0 8 Sensory: 0 9 Best Language: 0 10 Dysarthria: 1 11 Extinct. and Inatten.: 0 TOTAL: 7  Labs I have reviewed labs in epic and the results pertinent to this consultation are: Labs pending at the time of this dictation  CBC    Component Value Date/Time   WBC 4.0 11/01/2018 1046   WBC 5.4 04/22/2018 0817   RBC 4.46 11/01/2018 1046   RBC 4.73 04/22/2018 0817   HGB 14.3 11/01/2018 1046   HCT 42.2 11/01/2018 1046   PLT 104 (L) 11/01/2018 1046   MCV 95 11/01/2018 1046   MCH 32.1 11/01/2018 1046   MCHC 33.9 11/01/2018 1046   MCHC 34.7 04/22/2018 0817   RDW 12.6 11/01/2018 1046   LYMPHSABS 1.3 04/22/2018 0817   MONOABS 0.8 04/22/2018 0817   EOSABS 0.1 04/22/2018 0817   BASOSABS 0.1 04/22/2018 0817    CMP     Component Value Date/Time   NA 138 11/09/2018 1131   K 4.9 11/09/2018 1131   CL 99 11/09/2018 1131   CO2 25 11/09/2018 1131   GLUCOSE 80 11/09/2018 1131   GLUCOSE 76 04/22/2018 0817   GLUCOSE 90 09/28/2006 0957   BUN 15 11/09/2018 1131   CREATININE 1.37 (H) 11/09/2018 1131   CALCIUM 9.4 11/09/2018 1131   PROT 7.8 05/11/2018 1027   ALBUMIN 3.9 05/11/2018 1027   AST 36 05/11/2018 1027   ALT 47 05/11/2018 1027   ALKPHOS 56 05/11/2018 1027   BILITOT 1.4 (H) 05/11/2018 1027   GFRNONAA 52 (L) 11/09/2018 1131   GFRAA 60 11/09/2018 1131    Lipid Panel     Component Value Date/Time   CHOL 154 03/09/2018 1140   TRIG 97.0 03/09/2018 1140   TRIG 150 (H) 09/28/2006 0957   HDL 49.40 03/09/2018 1140   CHOLHDL 3 03/09/2018 1140   VLDL 19.4 03/09/2018 1140   LDLCALC 85 03/09/2018 1140   LDLDIRECT 126.5 01/18/2013  0807     Imaging I have reviewed the images obtained:  CT-scan of the brain-right lentiform nucleus bleed-17 cc, no IVH, no mass-effect or midline shift.  Assessment:  73 year old man who is on Eliquis for chronic atrial fibrillation presented for sudden onset left arm and leg weakness along with left facial weakness and slurred speech. NIH stroke scale documented above. CT head showed right lentiform nucleus ICH of 17 cc with no IVH no mass-effect or midline shift. ICH score is 0.  History of cavernomas - not in this location though.  Plan: Subcortical ICH, nontraumatic acuity: Acute Laterality: Right Current  suspected etiology:   Coagulopathy from Eliquis, can not r/o cavernoma. Location not same as his cavernomas from last MRI Treatment: -Admit to neurological ICU -ICH Score:0 -ICH Volume: 17 cc -BP control goal SYS<140  -PT/OT/ST  -neuromonitoring  History of cavernoma -MRI brain -inform NSGY in AM - Dr. Kathyrn Sheriff.  CNS -Close neuro monitoring  Dysarthria Dysphagia following ICH  -NPO until cleared by speech -ST -Advance diet as tolerated  hemiplegia and hemiparesis following nontraumatic intracerebral hemorrhage affecting left non-dominant side  -Continue PT/OT/ST  RESP No active issues Monitor clinically Possible aspiration pneumonia-n.p.o. and chest x-ray.  CV Essential (primary) hypertension Was not hypertensive on arrival  -Aggressive BP control, goal SBP < 140 -Labetalol and Cleviprex as needed-ordered  Chronic atrial -fibrillation -Rate control -On Eliquis-discontinue Eliquis.  Reversal as documented below.   GI/GU No active issues Gentle hydration  HEME Labs pending -Monitor -transfuse for hgb < 7  Coagulopathy secondary to anticoagulation with Eliquis -Reversed with Andexxa -Pete CT head in 6 hours  ENDO -goal HgbA1c < 7  Fluid/Electrolyte Disorders Monitor labs Replete as necessary  ID Possible Aspiration  PNA -CXR -NPO -Monitor   Prophylaxis DVT: No antiplatelets anticoagulants.  SCDs only. GI: Pantoprazole Bowel: Doc senna  Dispo: Inpatient rehab depending on clinical course  Diet: NPO until cleared by speech/bedside swallow eval  Code Status: Full Code     THE FOLLOWING WERE PRESENT ON ADMISSION: ICH Possible aspiration pneumonia Hemiparesis Cavernomas  -- Amie Portland, MD [facility] Pager: [phone] If 7pm to 7am, please call on call as listed on AMION.   CRITICAL CARE ATTESTATION Performed by: Amie Portland, MD Total critical care time: 45 minutes Critical care time was exclusive of separately billable procedures and treating other patients and/or supervising APPs/Residents/Students Critical care was necessary to treat or prevent imminent or life-threatening deterioration due to [place], coagulopathy secondary to Eliquis. This patient is critically ill and at significant risk for neurological worsening and/or death and care requires constant monitoring. Critical care was time spent personally by me on the following activities: development of treatment plan with patient and/or surrogate as well as nursing, discussions with consultants, evaluation of patient's response to treatment, examination of patient, obtaining history from patient or surrogate, ordering and performing treatments and interventions, ordering and review of laboratory studies, ordering and review of radiographic studies, pulse oximetry, re-evaluation of patient's condition, participation in multidisciplinary rounds and medical decision making of high complexity in the care of this patient.

## 2018-11-27 NOTE — ED Notes (Signed)
Lindzen MD at bedside

## 2018-11-27 NOTE — ED Triage Notes (Addendum)
Pt arrives via [facility], pt on eliquis for a fib and had fall at 1818. Pt hit his head. Pt reports left sided weakness and facial droop. Pt endorses posterior headache. Pt is alert and oriented.

## 2018-11-27 NOTE — ED Notes (Signed)
Cheral Marker, MD notified of increase in NIH

## 2018-11-27 NOTE — Progress Notes (Addendum)
Called by RN for worsened neurological exam, now with 0/5 LUE strength and new sensory loss to LUE per RN.  BP 122/77   Pulse 74   Temp 97.9 F (36.6 C) (Oral)   Resp 18   Wt (S) 74.9 kg   SpO2 97%   BMI 25.86 kg/m   Ment: Able to follow all commands. Speech fluent CN: Left pupil 3 mm, right pupil 2 mm, reactive bilaterally. Left hemianopsia. Absent FT sensation to left side of face. Left facial droop.  Motor:  LUE: 0/5 LLE: 3/5 RUE: 5/5 RLE 5/5 Intermittent rapid jerking of LLE occurring as single jerks without rhythmicity, appears most consistent with chorea.  Sensory: Absent FT and pressure sensation to LUE and LLE. Intact sensation on the right Cerebellar: No ataxia noted  A/R: Worsened Neurological exam in a patient with acute right basal ganglia hemorrhage 1. Currently with Andexxa infusing 2. STAT repeat CT of head  Addendum: STAT repeat CT of head shows subtle increased size along the posterior aspect of the hemorrhage, as well as slightly worsened surrounding edema and mass effect with increased narrowing of the right lateral ventricle. Will order hypertonic saline and repeat CT head in 4 hours. Will continue to observe for changes to neurological exam including possible worsening of the new onset chorea involving his LLE, which is most likely secondary to irritation of the right basal ganglia by the hemorrhage.    25 minutes of critical care time for this follow up encounter.   Addendum, 9:05 PM: Discussed the CT findings with Neurosurgery. No indication at this time for surgical intervention. If hydrocephalus or intraventricular hemorrhage develops, he may be a candidate for surgical intervention.   5 minutes of additional critical care time for coordination of care.   Electronically signed: Dr. Kerney Elbe

## 2018-11-27 NOTE — ED Provider Notes (Signed)
[facility] EMERGENCY DEPARTMENT Provider Note   CSN: [id number] Arrival date & time: 11/27/18  7829     History   Chief Complaint No chief complaint on file.   HPI Barry Woods is a 73 y.o. male with history of CHF, chronic atrial fibrillation, hypertension, hyperlipidemia presents for evaluation of acute onset, persistent left sided weakness beginning at around 6:15PM. He reports he was watching football when he stood up and noticed his left foot felt as though it was dragging. He then reports he fell, striking the top of his head on the wall. Denies loss of consciousness.  He attempted to get up and ambulate up the stairs but was unable to due to left-sided weakness and then called his daughter.  Code stroke was called out in the field by EMS.  He is currently anticoagulated on Eliquis.  He denies headache, vision changes, nausea, vomiting, chest pain, shortness of breath, abdominal pain, or numbness.  The history is provided by the patient.    Past Medical History:  Diagnosis Date  . Arthritis   . Cerebral cavernoma   . Hyperlipidemia   . Hypertension   . Rheumatoid arthritis [facility])     Patient Active Problem List   Diagnosis Date Noted  . ICH (intracerebral hemorrhage) ([place]) 11/27/2018  . Diastolic CHF, chronic ([place]) 11/09/2018  . Atrial fibrillation ([place]) 11/07/2018  . Dyspnea on exertion 10/31/2018  . Coronary artery calcification seen on CT scan 10/31/2018  . Routine general medical examination at a health care facility 03/09/2018  . Benign essential tremor 02/16/2017  . Pure hypercholesterolemia 02/16/2017  . ILD (interstitial lung disease) ([place]) 10/21/2016  . BPH associated with nocturia 09/24/2015  . Rheumatoid arthritis ([place]) 09/28/2011  . Shoulder pain, bilateral 06/16/2011  . Gout attack 02/02/2011  . 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 LIVER FUNCTION TESTS, ABNORMAL, HX OF 11/19/2008  . Hyperlipidemia, mild 11/01/2007  . Essential  hypertension 11/01/2007    Past Surgical History:  Procedure Laterality Date  . COLONOSCOPY    . LIPOMA EXCISION    . PALATE / UVULA BIOPSY / EXCISION    . TONSILLECTOMY     age 21        Home Medications    Prior to Admission medications   Medication Sig Start Date End Date Taking? Authorizing Provider  Adalimumab (HUMIRA) 40 MG/0.8ML PSKT Inject into the skin. Every other week    [provider]  apixaban (ELIQUIS) 5 MG TABS tablet Take 1 tablet (5 mg total) by mouth 2 (two) times daily. 11/01/18   Imogene Burn, PA-C  atenolol (TENORMIN) 50 MG tablet Take 1.5 tablets (75 mg total) by mouth daily. 08/17/18 11/15/18  Nafziger, Tommi Rumps, NP  furosemide (LASIX) 40 MG tablet Take 0.5 tablets (20 mg total) by mouth daily. 11/10/18 02/08/19  Imogene Burn, PA-C  potassium chloride SA (K-DUR,KLOR-CON) 20 MEQ tablet Take 0.5 tablets (10 mEq total) by mouth daily. 11/10/18   Imogene Burn, PA-C  simvastatin (ZOCOR) 20 MG tablet Take 1 tablet (20 mg total) by mouth at bedtime. 03/09/18   Dorena Cookey, MD    Family History Family History  Problem Relation Age of Onset  . Colon cancer Mother 94       57  . Hepatitis C Father   . CAD Father     Social History Social History   Tobacco Use  . Smoking status: Never Smoker  . Smokeless tobacco: Never Used  Substance Use Topics  .  Alcohol use: Yes    Alcohol/week: 14.0 standard drinks    Types: 14 Cans of beer per week  . Drug use: No     Allergies   Atorvastatin and Cetirizine hcl   Review of Systems Review of Systems  Constitutional: Negative for chills and fever.  Eyes: Negative for visual disturbance.  Respiratory: Negative for shortness of breath.   Cardiovascular: Negative for chest pain.  Gastrointestinal: Negative for abdominal pain, nausea and vomiting.  Neurological: Positive for facial asymmetry and weakness. Negative for syncope and headaches.  All other systems reviewed and are  negative.    Physical Exam Updated Vital Signs BP 133/87   Pulse 70   Temp 97.9 F (36.6 C) (Oral)   Resp 18   Wt (S) 74.9 kg   SpO2 97%   BMI 25.86 kg/m   Physical Exam Vitals signs and nursing note reviewed.  Constitutional:      General: He is not in acute distress.    Appearance: He is well-developed.  HENT:     Head: Normocephalic and atraumatic.  Eyes:     General:        Right eye: No discharge.        Left eye: No discharge.     Extraocular Movements: Extraocular movements intact.     Conjunctiva/sclera: Conjunctivae normal.     Pupils: Pupils are equal, round, and reactive to light.  Neck:     Vascular: No JVD.     Trachea: No tracheal deviation.  Cardiovascular:     Rate and Rhythm: Normal rate.     Comments: Irregularly irregular rhythm.  2+ radial and DP/PT pulses bilaterally, no lower extremity edema. Pulmonary:     Effort: Pulmonary effort is normal.     Breath sounds: Normal breath sounds.  Abdominal:     General: Bowel sounds are normal. There is no distension.     Tenderness: There is no abdominal tenderness. There is no guarding or rebound.  Skin:    General: Skin is warm and dry.     Findings: No erythema.  Neurological:     Mental Status: He is alert and oriented to person, place, and time.     Comments: Left-sided facial droop.  Left upper extremity plegia and left lower extremity paresis.  Sensation intact to soft touch of face and extremities.  Psychiatric:        Behavior: Behavior normal.      ED Treatments / Results  Labs (all labs ordered are listed, but only abnormal results are displayed) Labs Reviewed  PROTIME-INR - Abnormal; Notable for the following components:      Result Value   Prothrombin Time 17.4 (*)    All other components within normal limits  APTT - Abnormal; Notable for the following components:   aPTT 45 (*)    All other components within normal limits  CBC - Abnormal; Notable for the following components:    WBC 3.9 (*)    All other components within normal limits  I-STAT CHEM 8, ED - Abnormal; Notable for the following components:   Creatinine, Ser 1.40 (*)    Calcium, Ion 1.02 (*)    All other components within normal limits  DIFFERENTIAL  COMPREHENSIVE METABOLIC PANEL  HIV ANTIBODY (ROUTINE TESTING W REFLEX)  I-STAT TROPONIN, ED  CBG MONITORING, ED    EKG None  Radiology Ct Head Code Stroke Wo Contrast  Result Date: 11/27/2018 CLINICAL DATA:  Code stroke. 73 y/o M; left-sided weakness and  facial droop. EXAM: CT HEAD WITHOUT CONTRAST TECHNIQUE: Contiguous axial images were obtained from the base of the skull through the vertex without intravenous contrast. COMPARISON:  None. FINDINGS: Brain: Acute hemorrhage centered within the right lentiform nucleus measuring 3.9 x 2.3 x 3.7 cm (volume = 17 cm^3), series 3, image 17 and series 6, image 20. Mild surrounding edema. Associated local mass effect partially effaces the right lateral ventricle. No significant midline shift. No downward herniation. No intraventricular hemorrhage or subarachnoid hemorrhage. Stable background of chronic microvascular ischemic changes and volume loss of the brain. Small density within the left frontal lobe corresponds to cavernoma on prior MRI of the head. Vascular: Calcific atherosclerosis of carotid siphons. No hyperdense vessel identified. Skull: Normal. Negative for fracture or focal lesion. Sinuses/Orbits: No acute finding. Other: None. ASPECTS [facility] Stroke Program Early CT Score) - Ganglionic level infarction (caudate, lentiform nuclei, internal capsule, insula, M1-M3 cortex): 7 - Supraganglionic infarction (M4-M6 cortex): 3 Total score (0-10 with 10 being normal): 10 IMPRESSION: 1. Acute hemorrhage within the right lentiform nucleus measuring up to 3.9 cm, 17 cc. 2. Associated edema and mass effect results in partial effacement of the right lateral ventricle. No midline shift or herniation. 3. ASPECTS is not  applicable. 4. Stable background of chronic microvascular ischemic changes and volume loss of the brain. Left frontal cavernoma again noted. Critical Value/emergent results were called by telephone at the time of interpretation on 11/27/2018 at 7:06 pm to Dr. Dr. Rory Percy, who verbally acknowledged these results. Electronically Signed   By: Kristine Garbe M.D.   On: 11/27/2018 19:11    Procedures .Critical Care Performed by: Renita Papa, PA-C Authorized by: Renita Papa, PA-C   Critical care provider statement:    Critical care time (minutes):  40   Critical care was necessary to treat or prevent imminent or life-threatening deterioration of the following conditions:  CNS failure or compromise (Hemorrhagic stroke)   Critical care was time spent personally by me on the following activities:  Discussions with consultants, evaluation of patient's response to treatment, examination of patient, ordering and performing treatments and interventions, ordering and review of laboratory studies, ordering and review of radiographic studies, pulse oximetry, re-evaluation of patient's condition, obtaining history from patient or surrogate and review of old charts   I assumed direction of critical care for this patient from another provider in my specialty: no     (including critical care time)  Medications Ordered in ED Medications   stroke: mapping our early stages of recovery book (has no administration in time range)  acetaminophen (TYLENOL) tablet 650 mg (has no administration in time range)    Or  acetaminophen (TYLENOL) solution 650 mg (has no administration in time range)    Or  acetaminophen (TYLENOL) suppository 650 mg (has no administration in time range)  senna-docusate (Senokot-S) tablet 1 tablet (has no administration in time range)  pantoprazole (PROTONIX) injection 40 mg (has no administration in time range)  labetalol (NORMODYNE,TRANDATE) injection 20 mg (20 mg Intravenous Given  11/27/18 1915)    And  clevidipine (CLEVIPREX) infusion 0.5 mg/mL (2 mg/hr Intravenous Rate/Dose Change 11/27/18 1934)  coag fact Xa recombinant (ANDEXXA) low dose infusion 900 mg (900 mg Intravenous New Bag/Given 11/27/18 1929)     Initial Impression / Assessment and Plan / ED Course  I have reviewed the triage vital signs and the nursing notes.  Pertinent labs & imaging results that were available during my care of the patient were reviewed by me  and considered in my medical decision making (see chart for details).     Patient presenting with left-sided deficits beginning at around 6:15 PM (6:18PM more specifically, as reported by EMS).  He is afebrile, mildly hypertensive on initial presentation.  Due to left-sided deficits, code stroke was called in the field with EMS and CT shows acute hemorrhage within the right lentiform nucleus with associated edema and mass-effect resulting in partial effacement of the right lateral ventricle.  No midline shift or herniation.  Thus he is not a candidate for TPA.  Will reverse anticoagulation with Andexxa.  Goal BP <038 systolic.  Patient seen and evaluated by Dr. Malen Gauze with neurology, plan to admit to neuro ICU.  Patient seen and evaluate Dr. Tomi Bamberger who agrees with assessment and plan at this time.  Final Clinical Impressions(s) / ED Diagnoses   Final diagnoses:  Hemorrhagic stroke [facility])    ED Discharge Orders    None       Debroah Baller 11/27/18 1944    Dorie Rank, MD 11/27/18 2151

## 2018-11-28 ENCOUNTER — Encounter (HOSPITAL_COMMUNITY): Payer: Self-pay

## 2018-11-28 ENCOUNTER — Inpatient Hospital Stay (HOSPITAL_COMMUNITY): Payer: Medicare HMO

## 2018-11-28 ENCOUNTER — Other Ambulatory Visit: Payer: Self-pay

## 2018-11-28 ENCOUNTER — Telehealth: Payer: Self-pay

## 2018-11-28 DIAGNOSIS — Q283 Other malformations of cerebral vessels: Secondary | ICD-10-CM

## 2018-11-28 LAB — SODIUM
SODIUM: 136 mmol/L (ref 135–145)
Sodium: 138 mmol/L (ref 135–145)
Sodium: 140 mmol/L (ref 135–145)
Sodium: 138 mmol/L (ref 135–145)

## 2018-11-28 LAB — MRSA PCR SCREENING: MRSA by PCR: NEGATIVE

## 2018-11-28 LAB — HIV ANTIBODY (ROUTINE TESTING W REFLEX): HIV Screen 4th Generation wRfx: NONREACTIVE

## 2018-11-28 MED ORDER — FUROSEMIDE 40 MG PO TABS: 40.0000 mg | ORAL_TABLET | Freq: Every day | ORAL | Status: DC

## 2018-11-28 MED ORDER — POTASSIUM CHLORIDE CRYS ER 20 MEQ PO TBCR
20.0000 meq | EXTENDED_RELEASE_TABLET | Freq: Every day | ORAL | Status: DC
  Administered 2018-11-28 – 2018-12-01 (×4): 20 meq via ORAL
  Filled 2018-11-28 (×4): qty 1

## 2018-11-28 MED ORDER — ATENOLOL 25 MG PO TABS
75.0000 mg | ORAL_TABLET | Freq: Every day | ORAL | Status: DC
  Administered 2018-11-28 – 2018-12-01 (×4): 75 mg via ORAL
  Filled 2018-11-28: qty 3
  Filled 2018-11-28: qty 2
  Filled 2018-11-28: qty 3
  Filled 2018-11-28: qty 2

## 2018-11-28 MED ORDER — SIMVASTATIN 20 MG PO TABS
20.0000 mg | ORAL_TABLET | Freq: Every day | ORAL | Status: DC
  Administered 2018-11-28 – 2018-11-30 (×3): 20 mg via ORAL
  Filled 2018-11-28 (×3): qty 1

## 2018-11-28 NOTE — Progress Notes (Signed)
Modified Barium Swallow Progress Note  Patient Details  Name: Barry Woods MRN: [id number] Date of Birth: Jan 10, 1946  Today's Date: 11/28/2018  Modified Barium Swallow completed.  Full report located under Chart Review in the Imaging Section.  Brief recommendations include the following:  Clinical Impression  Pt demonstrates mild pharyngeal weakness with decreased effort and duration of laryngeal elevation and hyoid excursion. This leads to mild vallecular residue and slightly decreased laryngeal closure that at times leads to trace penetration and aspiraiton events during and after the swallow. The pt senses this penetrate with a throat clear, though needs cues to apply force to fully eject. A chin tuck was beneficial in decreased residue and improving laryngeal protection. recommend the pt initiate a mechanical soft diet and thin liquids with a chin tuck and intermittent throat clearing to eject aspirate if present. Would benefit from EMST to target stegth and respiratory drive. Will follow for tolerance.    Swallow Evaluation Recommendations       SLP Diet Recommendations: Dysphagia 3 (Mech soft) solids;Thin liquid   Liquid Administration via: Cup;Straw   Medication Administration: Whole meds with liquid   Supervision: Patient able to self feed   Compensations: Slow rate;Small sips/bites;Chin tuck;Clear throat intermittently   Postural Changes: Remain semi-upright after after feeds/meals (Comment)           Herbie Baltimore, MA CCC-SLP  Acute Rehabilitation Services Pager [phone] Office [phone]  Lynann Beaver 11/28/2018,3:00 PM

## 2018-11-28 NOTE — Evaluation (Signed)
Clinical/Bedside Swallow Evaluation Patient Details  Name: Barry Woods MRN: [id number] Date of Birth: Feb 08, 1946  Today's Date: 11/28/2018 Time: SLP Start Time (ACUTE ONLY): 42 SLP Stop Time (ACUTE ONLY): 1130 SLP Time Calculation (min) (ACUTE ONLY): 70 min  Past Medical History:  Past Medical History:  Diagnosis Date  . Arthritis   . Cerebral cavernoma   . Hyperlipidemia   . Hypertension   . Rheumatoid arthritis [facility])    Past Surgical History:  Past Surgical History:  Procedure Laterality Date  . COLONOSCOPY    . LIPOMA EXCISION    . PALATE / UVULA BIOPSY / EXCISION    . TONSILLECTOMY     age 22   HPI:  The patient is a 73 year old white male who was admitted yesterday with a right basal ganglia hemorrhage.   Assessment / Plan / Recommendation Clinical Impression  Pt demonstrates immediate signs of aspiration with thin liquids with cup and straw sips, severity of coughing increased with volume of intake. Trials of puree tolerated without cough or wet vocal quality. Suspect timing impairment but difficutly to assess subjectively. Recommend MBS for more objsective assessment prior to diet initiation. Pt may have ice chips and bites of puree with meds as needed, MBS scheduled for 130.  SLP Visit Diagnosis: Dysphagia, oropharyngeal phase (R13.12)    Aspiration Risk  Moderate aspiration risk    Diet Recommendation NPO;NPO except meds;Ice chips PRN after oral care   Medication Administration: Whole meds with puree    Other  Recommendations     Follow up Recommendations Inpatient Rehab      Frequency and Duration            Prognosis Prognosis for Safe Diet Advancement: Good      Swallow Study   General HPI: The patient is a 73 year old white male who was admitted yesterday with a right basal ganglia hemorrhage. Type of Study: Bedside Swallow Evaluation Previous Swallow Assessment: none Diet Prior to this Study: NPO Temperature Spikes Noted: No History of  Recent Intubation: No Behavior/Cognition: Alert;Cooperative;Pleasant mood Oral Cavity - Dentition: Adequate natural dentition Self-Feeding Abilities: Needs assist(tremor) Volitional Cough: Strong Volitional Swallow: Able to elicit    Oral/Motor/Sensory Function Overall Oral Motor/Sensory Function: Mild impairment Facial ROM: Reduced left;Suspected CN VII (facial) dysfunction Facial Symmetry: Abnormal symmetry left;Suspected CN VII (facial) dysfunction Facial Strength: Reduced left;Suspected CN VII (facial) dysfunction Facial Sensation: Within Functional Limits Lingual ROM: Within Functional Limits Lingual Symmetry: Within Functional Limits Lingual Strength: Within Functional Limits Lingual Sensation: Within Functional Limits Velum: Within Functional Limits Mandible: Within Functional Limits   Ice Chips Ice chips: Within functional limits   Thin Liquid Thin Liquid: Impaired Presentation: Cup;Straw Pharyngeal  Phase Impairments: Suspected delayed Swallow;Cough - Immediate;Throat Clearing - Immediate    Nectar Thick Nectar Thick Liquid: Not tested   Honey Thick Honey Thick Liquid: Not tested   Puree Puree: Within functional limits Presentation: Spoon   Solid     Solid: Not tested     Herbie Baltimore, MA [place] Pager [phone] Office [phone]  Lynann Beaver 11/28/2018,11:04 AM

## 2018-11-28 NOTE — Progress Notes (Signed)
OT Cancellation Note  Patient Details Name: Barry Woods MRN: [id number] DOB: 08-14-1946   Cancelled Treatment:    Reason Eval/Treat Not Completed: Active bedrest order. Will follow and initiate OT eval when appropriate and able.    Delight Stare, OT Acute Rehabilitation Services Pager [phone] Office [phone]    Delight Stare 11/28/2018, 7:49 AM

## 2018-11-28 NOTE — Evaluation (Signed)
Occupational Therapy Evaluation Patient Details Name: Barry Woods MRN: [id number] DOB: [birth year] Today's Date: 11/28/2018    History of Present Illness 73 yo admitted with left weakness and right basal ganglia hemorrhage. Pmhx: Afib, HTN, HLD, RA   Clinical Impression   PTA patient independent.  Admitted for above and limited by problem list below, including safety awareness, L sided weakness and impaired sensation/coordination, impaired balance.  Patient currently requires max assist +2 for toilet transfers, mod assist for UB ADLs, and max assist +2 for LB ADLs. Patient will benefit from continued OT services while admitted and after dc at CIR level in order to optimize independence and safety with ADLs/mobility.  Will follow.   I have discussed the patient's current level of function related to ADLs/mobiltiy with the patient and daughter. They acknowledge understanding of this and do not feel the patient would be able to have their care needs met at home.  They are interested in post-acute rehab in an inpatient setting.      Follow Up Recommendations  CIR    Equipment Recommendations  Other (comment)(TBD at next venue of care)    Recommendations for Other Services Rehab consult     Precautions / Restrictions Precautions Precautions: Fall Restrictions Weight Bearing Restrictions: No      Mobility Bed Mobility Overal bed mobility: Needs Assistance Bed Mobility: Supine to Sit     Supine to sit: Mod assist     General bed mobility comments: mod assist for trunk support and L LE towards EOB, scooting foward   Transfers Overall transfer level: Needs assistance Equipment used: 1 person hand held assist Transfers: Sit to/from [employer] Sit to Stand: Mod assist;+2 physical assistance;+2 safety/equipment   Squat pivot transfers: Max assist;+2 physical assistance;+2 safety/equipment     General transfer comment: mod assist +2 to stand with L lateral  lean and poor awareness to midline positioning, max assist for squat pivot towards R side onto commode     Balance Overall balance assessment: Needs assistance Sitting-balance support: Feet supported;Bilateral upper extremity supported Sitting balance-Leahy Scale: Poor Sitting balance - Comments: Lateral lean to L in static sitting  Postural control: Left lateral lean Standing balance support: Bilateral upper extremity supported;During functional activity Standing balance-Leahy Scale: Poor Standing balance comment: L lateral lean with reliance on UE support and poor midline awareness                           ADL either performed or assessed with clinical judgement   ADL Overall ADL's : Needs assistance/impaired Eating/Feeding: Set up;Supervision/ safety;Sitting   Grooming: Minimal assistance;Sitting Grooming Details (indicate cue type and reason): L lateral lean in unsupported sitting  Upper Body Bathing: Sitting;Minimal assistance   Lower Body Bathing: Maximal assistance;+2 for physical assistance;+2 for safety/equipment;Sit to/from stand   Upper Body Dressing : Sitting;Moderate assistance   Lower Body Dressing: Maximal assistance;+2 for physical assistance;+2 for safety/equipment;Sit to/from stand   Toilet Transfer: Maximal assistance;+2 for physical assistance;+2 for safety/equipment;Squat-pivot;Comfort height toilet           Functional mobility during ADLs: Maximal assistance;+2 for physical assistance;+2 for safety/equipment General ADL Comments: patient determined to use regular commode, but refused to use with therapist present; but presents with L lateral lean in sitting and poor awareness of deficits and safety     Vision Baseline Vision/History: No visual deficits Patient Visual Report: No change from baseline Additional Comments: further assessment needed     Perception  Perception Comments: decreased spatial awareness and L inattnetion   Praxis       Pertinent Vitals/Pain Pain Assessment: No/denies pain     Hand Dominance Right   Extremity/Trunk Assessment Upper Extremity Assessment Upper Extremity Assessment: LUE deficits/detail LUE Deficits / Details: grossly 3-/5, able to use as a gross stabilizer LUE Sensation: decreased light touch;decreased proprioception LUE Coordination: decreased fine motor;decreased gross motor   Lower Extremity Assessment Lower Extremity Assessment: Defer to PT evaluation       Communication Communication Communication: No difficulties   Cognition Arousal/Alertness: Awake/alert Behavior During Therapy: Impulsive Overall Cognitive Status: Impaired/Different from baseline Area of Impairment: Attention;Following commands;Memory;Safety/judgement;Awareness;Problem solving                   Current Attention Level: Sustained Memory: Decreased recall of precautions;Decreased short-term memory Following Commands: Follows one step commands with increased time Safety/Judgement: Decreased awareness of safety;Decreased awareness of deficits Awareness: Emergent Problem Solving: Difficulty sequencing;Requires verbal cues General Comments: Patient impulsive throughout session, decreased awareness of deficits and assistance needed for mobility.    General Comments  family present and supportive at completion of session    Exercises     Shoulder Instructions      Home Living Family/patient expects to be discharged to:: Private residence Living Arrangements: Alone Available Help at Discharge: Family Type of Home: House Home Access: Level entry     Home Layout: Multi-level;Bed/bath upstairs;1/2 bath on main level Alternate Level Stairs-Number of Steps: 2 flights to bedroom             Home Equipment: None          Prior Functioning/Environment Level of Independence: Independent                 OT Problem List: Decreased strength;Decreased activity tolerance;Impaired  balance (sitting and/or standing);Decreased coordination;Decreased cognition;Decreased safety awareness;Decreased knowledge of use of DME or AE;Decreased knowledge of precautions;Impaired sensation;Impaired UE functional use      OT Treatment/Interventions: Self-care/ADL training;Neuromuscular education;DME and/or AE instruction;Therapeutic activities;Cognitive remediation/compensation;Visual/perceptual remediation/compensation;Patient/family education;Balance training    OT Goals(Current goals can be found in the care plan section) Acute Rehab OT Goals Patient Stated Goal: to get to the bathroom OT Goal Formulation: With patient Time For Goal Achievement: 12/12/18 Potential to Achieve Goals: Good  OT Frequency: Min 2X/week   Barriers to D/C:            Co-evaluation              AM-PAC OT "6 Clicks" Daily Activity     Outcome Measure Help from another person eating meals?: A Little Help from another person taking care of personal grooming?: A Little Help from another person toileting, which includes using toliet, bedpan, or urinal?: Total Help from another person bathing (including washing, rinsing, drying)?: A Lot Help from another person to put on and taking off regular upper body clothing?: A Lot Help from another person to put on and taking off regular lower body clothing?: Total 6 Click Score: 12   End of Session Equipment Utilized During Treatment: Gait belt Nurse Communication: Mobility status;Precautions  Activity Tolerance: Patient tolerated treatment well Patient left: in chair;with call bell/phone within reach;with chair alarm set;with family/visitor present  OT Visit Diagnosis: Other abnormalities of gait and mobility (R26.89);Hemiplegia and hemiparesis;Other symptoms and signs involving cognitive function Hemiplegia - Right/Left: Left Hemiplegia - dominant/non-dominant: Non-Dominant Hemiplegia - caused by: Nontraumatic intracerebral hemorrhage  Time: 3267-1245 OT Time Calculation (min): 44 min Charges:  OT General Charges $OT Visit: 1 Visit OT Evaluation $OT Eval Moderate Complexity: 1 Mod OT Treatments $Self Care/Home Management : 23-37 mins  Delight Stare, OT Acute Rehabilitation Services Pager [phone] Office [phone]   Delight Stare 11/28/2018, 5:37 PM

## 2018-11-28 NOTE — Progress Notes (Addendum)
STROKE TEAM PROGRESS NOTE   INTERVAL HISTORY His 2 daughters [name] and Mickel Baas along with an old friend are at the bedside.  He is lying in the bed, awake, alert. Not intubated. On cleviprex for Bp goal < 140. Followed bu Dr. Kathyrn Sheriff for cavernomas PTA.   Vitals:   11/28/18 0745 11/28/18 0800 11/28/18 0815 11/28/18 0900  BP: 127/81 129/70 126/65 127/65  Pulse: 80 82 79 (!) 59  Resp: (!) 25 18 19 15   Temp:  99.7 F (37.6 C)    TempSrc:  Oral    SpO2: 95% 93% 94% 95%  Weight:        CBC:  Recent Labs  Lab 11/27/18 1905 11/27/18 1914  WBC 3.9*  --   NEUTROABS 1.7  --   HGB 14.1 15.0  HCT 43.6 44.0  MCV 95.8  --   PLT PLATELET CLUMPS NOTED ON SMEAR, COUNT APPEARS DECREASED  --     Basic Metabolic Panel:  Recent Labs  Lab 11/27/18 1905 11/27/18 1914 11/28/18 0345 11/28/18 0810  NA 131* 137 136 138  K 4.0 3.6  --   --   CL 98 100  --   --   CO2 24  --   --   --   GLUCOSE 90 87  --   --   BUN 14 16  --   --   CREATININE 1.33* 1.40*  --   --   CALCIUM 8.5*  --   --   --    Lipid Panel:     Component Value Date/Time   CHOL 154 03/09/2018 1140   TRIG 97.0 03/09/2018 1140   TRIG 150 (H) 09/28/2006 0957   HDL 49.40 03/09/2018 1140   CHOLHDL 3 03/09/2018 1140   VLDL 19.4 03/09/2018 1140   LDLCALC 85 03/09/2018 1140   HgbA1c: No results found for: HGBA1C Urine Drug Screen: No results found for: LABOPIA, COCAINSCRNUR, LABBENZ, AMPHETMU, THCU, LABBARB  Alcohol Level No results found for: ETH  IMAGING Ct Head Wo Contrast  Result Date: 11/28/2018 CLINICAL DATA:  Follow-up examination for intracerebral hemorrhage. EXAM: CT HEAD WITHOUT CONTRAST TECHNIQUE: Contiguous axial images were obtained from the base of the skull through the vertex without intravenous contrast. COMPARISON:  Prior CT from 11/27/2018. FINDINGS: Brain: Acute intraparenchymal hemorrhage centered at the right lentiform nucleus stable in size measuring 4.4 x 2.5 x 3.7 cm. Surrounding low-density  vasogenic edema mildly increased similar regional mass effect with partial effacement of the right lateral ventricle and 2 mm right-to-left shift. No hydrocephalus or ventricular trapping. Basilar cisterns remain patent. No intraventricular extension or other complication identified. Few additional tiny foci of subarachnoid blood noted within the left cerebral hemisphere (series 3, image 20, 26), grossly similar to previous. No other new acute intracranial hemorrhage. No acute large vessel territory infarct. Underlying atrophy with chronic microvascular ischemic disease again noted. No extra-axial fluid collection. Vascular: No hyperdense vessel. Calcified atherosclerosis at the skull base. Skull: Scalp soft tissues and calvarium within normal limits. Sinuses/Orbits: Globes and orbital soft tissues within normal limits. Paranasal sinuses and mastoid air cells are clear. Other: None. IMPRESSION: 1. No significant interval change in size and appearance of intraparenchymal hemorrhage centered at the right basal ganglia, with similar localized edema and regional mass effect. Associated 2 mm right-to-left midline shift unchanged. 2. Otherwise stable appearance of the brain with no other new acute intracranial abnormality identified. Electronically Signed   By: Jeannine Boga M.D.   On: 11/28/2018 01:34  Ct Head Wo Contrast  Result Date: 11/27/2018 CLINICAL DATA:  73 y/o M; condition deteriorating. Intracranial hemorrhage. EXAM: CT HEAD WITHOUT CONTRAST TECHNIQUE: Contiguous axial images were obtained from the base of the skull through the vertex without intravenous contrast. COMPARISON:  11/27/2017 CT head. FINDINGS: Brain: Hemorrhage centered within the right lentiform nucleus measures 4.4 x 2.5 x 3.7 cm (volume = 21 cm^3)(AP x ML x CC series 3, image 19 and series 6, image 24). Surrounding edema and associated mass effect is also mildly increased with 2 mm right-to-left midline shift increased effacement of  the right lateral ventricle. No intraventricular subarachnoid hemorrhage identified. 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 Stable chronic microvascular ischemic changes and volume loss of the brain. Stable small density in the left frontal white matter corresponding to cavernoma on prior MRI. Vascular: Calcific atherosclerosis of carotid siphons. No hyperdense vessel. Skull: Normal. Negative for fracture or focal lesion. Sinuses/Orbits: No acute finding. Other: None. IMPRESSION: 1. Mild increase in size of hemorrhage within the right lentiform nucleus measuring up to 4.4 cm, 21 cc. Previously 3.9 cm and 17 cc. 2. Mildly increased associated mass effect with increased effacement of right lateral ventricle and 2 mm right-to-left midline shift. These results were called by telephone at the time of interpretation on 11/27/2018 at 8:35 pm to Dr. Kerney Elbe , who verbally acknowledged these results. Electronically Signed   By: Kristine Garbe M.D.   On: 11/27/2018 20:39   Ct Head Code Stroke Wo Contrast  Result Date: 11/27/2018 CLINICAL DATA:  Code stroke. 73 y/o M; left-sided weakness and facial droop. EXAM: CT HEAD WITHOUT CONTRAST TECHNIQUE: Contiguous axial images were obtained from the base of the skull through the vertex without intravenous contrast. COMPARISON:  None. FINDINGS: Brain: Acute hemorrhage centered within the right lentiform nucleus measuring 3.9 x 2.3 x 3.7 cm (volume = 17 cm^3), series 3, image 17 and series 6, image 20. Mild surrounding edema. Associated local mass effect partially effaces the right lateral ventricle. No significant midline shift. No downward herniation. No intraventricular hemorrhage or subarachnoid hemorrhage. Stable background of chronic microvascular ischemic changes and volume loss of the brain. Small density within the left frontal lobe corresponds to cavernoma on prior MRI of the head. Vascular: Calcific atherosclerosis of carotid siphons.  No hyperdense vessel identified. Skull: Normal. Negative for fracture or focal lesion. Sinuses/Orbits: No acute finding. Other: None. ASPECTS [facility] Stroke Program Early CT Score) - Ganglionic level infarction (caudate, lentiform nuclei, internal capsule, insula, M1-M3 cortex): 7 - Supraganglionic infarction (M4-M6 cortex): 3 Total score (0-10 with 10 being normal): 10 IMPRESSION: 1. Acute hemorrhage within the right lentiform nucleus measuring up to 3.9 cm, 17 cc. 2. Associated edema and mass effect results in partial effacement of the right lateral ventricle. No midline shift or herniation. 3. ASPECTS is not applicable. 4. Stable background of chronic microvascular ischemic changes and volume loss of the brain. Left frontal cavernoma again noted. Critical Value/emergent results were called by telephone at the time of interpretation on 11/27/2018 at 7:06 pm to Dr. Dr. Rory Percy, who verbally acknowledged these results. Electronically Signed   By: Kristine Garbe M.D.   On: 11/27/2018 19:11    PHYSICAL EXAM Pleasant elderly Caucasian male not in distress. . Afebrile. Head is nontraumatic. Neck is supple without bruit.    Cardiac exam no murmur or gallop. Lungs are clear to auscultation. Distal pulses are well felt. Neurological Exam :  Awake alert oriented 3 with normal speech and language  function. No aphasia or dysarthria. Extraocular movements are full range without nystagmus. Blinks to threat bilaterally. Fundi not visualized. Mild left lower facial weakness. Tongue midline. Motor system exam shows mild left hemiparesis 4/5 with weakness of left grip and intrin Orbits right over left upper extremity. Mild weakness of left hip flexors ankle dorsiflexors only. Sensation appears preserved bilaterally. Plantars are downgoing.  Gait not tested.  ASSESSMENT/PLAN [name] is a 73 y.o. male with history of AF on eliquis, RA, HTN, HLD, cerebral cavernomas presenting with L sided weakness.    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  Small vessel disease.  Left frontal cavernoma  CT head 1/5 2033 mild increase in size of hemorrhage up to 21 cc, previously 17 cc.  Mildly increased mass-effect and effacement of right lateral ventricle with 2 mm right to left shift.  CT head 1/6 0101 no significant change in size of hemorrhage or midline shift.  MRI / MRA head R basal ganglia hemorrhage with surrounding edema.  2 to 3 mm right to left shift.  No underlying tumor or high flow vascular malformation.  Chronic small vessel disease.  Foci of hemosiderin consistent with old prior hemorrhage.  Suggest amyloid angiopathy.  4 mm infarct medial left subcortical white matter.  LDL pending   SCDs for VTE prophylaxis  Eliquis (apixaban) daily prior to admission, now on No antithrombotic given ICH  Therapy recommendations:  pending . Ok to be OOB  Disposition:  pending (retired, lives alone in a 3-story town home)  Keep in ICU over night if possible  Cerebral Edema Induced hypernatremia  Pt awake and alert  On 3% via PIV  Na 138  Continue x 24h then d/c  Atrial Fibrillation  Home anticoagulation:  Eliquis (apixaban) daily   Eliquis reversed w/ Andexxa  Planned cardioversion next week - [place]   No longer a long-term AC candidate  No need for IP cardiology consult at this time  Hypertension  BP as high as 149/86  Now on Cleviprex  SBP goal < 140 x 24h  Home meds:  Atenolol 75 mg daily   Resume home meds . Long-term BP goal normotensive  Hyperlipidemia  Home meds:  zocor 20  Resume in hospital once able to swallow  LDL pending   Hold  statin at this time  Other Stroke Risk Factors  Advanced age  ETOH use, advised to drink no more than 2 drink(s) a day  Cerebral Cavernomas, followed by Dr. Kathyrn Sheriff  Other Active Problems  RA on scheduled injections Humira  CKD,  Cr 1.4  Essential tremor, on atenolol  On lasix and potassium at home. Will resume K now, consider lasix in am  Hospital day # [place], MSN, APRN, ANVP-BC, AGPCNP-BC Advanced Practice Stroke Nurse [place] for Schedule & Pager information 11/28/2018 9:48 AM  I have personally obtained history,examined this patient, reviewed notes, independently viewed imaging studies, participated in medical decision making and plan of care.ROS completed by me personally and pertinent positives fully documented  I have made any additions or clarifications directly to the above note. he presented with left hemiparesis due to right basal ganglia hemorrhage likely  related to anticoagulation with eliquis. Recommend strict blood pressure cont and close neurological monitoring. Continue hypertonic saline today and check follow-up scan tomorrow and .likely discontinue hypertonic saline tomorrow. Mobilize out of bed. Therapy consults. May consider adding aspirin in a few days. Long discussion with patient and daughter at the bedside and answered questions.This patient is critically ill and at significant risk of neurological worsening, death and care requires constant monitoring of vital signs, hemodynamics,respiratory and cardiac monitoring, extensive review of multiple databases, frequent neurological assessment, discussion with family, other specialists and medical decision making of high complexity.I have made any additions or clarifications directly to the above note.This critical care time does not reflect procedure time, or teaching time or supervisory time of PA/NP/Med Resident etc but could involve care discussion time.  I spent 35 minutes of  neurocritical care time  in the care of  this patient.      Antony Contras, MD Medical Director [place] Pager: [phone] 11/28/2018 4:30 PM  To contact Stroke Continuity provider, please refer to [email or web address] After hours, contact General Neurology

## 2018-11-28 NOTE — Progress Notes (Signed)
PT Cancellation Note  Patient Details Name: JOVIAN LEMBCKE MRN: [id number] DOB: Aug 14, 1946   Cancelled Treatment:    Reason Eval/Treat Not Completed: Active bedrest order   Sandy Salaam Melvia Matousek 11/28/2018, 6:51 AM  Elwyn Reach, PT Acute Rehabilitation Services Pager: [phone] Office: [phone]

## 2018-11-28 NOTE — Telephone Encounter (Signed)
**Note De-Identified Barry Woods Obfuscation** We received a letter from [employer] Genise Strack fax stating that they have denied pt asst to the pt for Eliquis because he has insurance coverage.  It is unclear who started this pt asst application.  I have left a message on the pts VM asking him to call me back.

## 2018-11-28 NOTE — Progress Notes (Signed)
OT Cancellation Note  Patient Details Name: [name] MRN: [id number] DOB: [birth year]   Cancelled Treatment:    Reason Eval/Treat Not Completed: Other (comment) Initiated eval, but MRI transport arrived and unable to complete.  Will follow.   Delight Stare, OT Acute Rehabilitation Services Pager [phone] Office [phone]   Delight Stare 11/28/2018, 12:16 PM

## 2018-11-28 NOTE — Progress Notes (Signed)
PT Cancellation Note  Patient Details Name: Barry Woods MRN: [id number] DOB: [birth year]   Cancelled Treatment:     Attempted to see pt after bedrest lifted, initiated evaluation but transport for MRI arrived.    Khyli Swaim B Dae Highley 11/28/2018, 12:08 PM Elwyn Reach, PT Acute Rehabilitation Services Pager: [phone] Office: [phone]

## 2018-11-28 NOTE — Consult Note (Signed)
Reason for Consult: Right basal ganglia hemorrhage Referring Physician: Dr. Virgil Benedict is an 73 y.o. male.  HPI: The patient is a 73 year old white male who was admitted yesterday with a right basal ganglia hemorrhage.  Neurosurgical consultation was requested by Dr. Cheral Marker.  I viewed the patient's series of CT scans from home.  Presently the patient is accompanied by multiple family members.  He denies headaches.  He has numbness and weakness on the left.  Evidently this has improved throughout the evening.  He has had 3 CT scans since admission which have been stable.  The patient has seen Dr. 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  He was scheduled to have a follow-up MRI this week. Past Medical History:  Diagnosis Date  . Arthritis   . Cerebral cavernoma   . Hyperlipidemia   . Hypertension   . Rheumatoid arthritis [facility])     Past Surgical History:  Procedure Laterality Date  . COLONOSCOPY    . LIPOMA EXCISION    . PALATE / UVULA BIOPSY / EXCISION    . TONSILLECTOMY     age 62    Family History  Problem Relation Age of Onset  . Colon cancer Mother 64       57  . Hepatitis C Father   . CAD Father     Social History:  reports that he has never smoked. He has never used smokeless tobacco. He reports current alcohol use of about 14.0 standard drinks of alcohol per week. He reports that he does not use drugs.  Allergies:  Allergies  Allergen Reactions  . Atorvastatin Hives  . Cetirizine Hcl Hives    Medications:  I have reviewed the patient's current medications. Prior to Admission:  Medications Prior to Admission  Medication Sig Dispense Refill Last Dose  . Adalimumab (HUMIRA) 40 MG/0.8ML PSKT Inject 40 mg into the skin See admin instructions. Every other week    Past Month at Unknown time  . apixaban (ELIQUIS) 5 MG TABS tablet Take 1 tablet (5 mg total) by mouth 2 (two) times daily. 60 tablet 11 11/27/2018 at 0900  .  atenolol (TENORMIN) 50 MG tablet Take 1.5 tablets (75 mg total) by mouth daily. 135 tablet 1 11/27/2018 at 0900  . furosemide (LASIX) 40 MG tablet Take 0.5 tablets (20 mg total) by mouth daily. (Patient taking differently: Take 40 mg by mouth daily. ) 45 tablet 3 11/27/2018 at Unknown time  . potassium chloride SA (K-DUR,KLOR-CON) 20 MEQ tablet Take 0.5 tablets (10 mEq total) by mouth daily. (Patient taking differently: Take 20 mEq by mouth daily. ) 45 tablet 3 11/27/2018 at Unknown time  . predniSONE (DELTASONE) 10 MG tablet Take 10-40 mg by mouth See admin instructions. Take 4 tabs daily for 2 days, then 3 tabs daily for 2 days then 2 tabs daily for 2 days then 1 tab daily for 2 days started on 11/10/18   Past Month at Unknown time  . simvastatin (ZOCOR) 20 MG tablet Take 1 tablet (20 mg total) by mouth at bedtime. 100 tablet 4 11/26/2018 at Unknown time   Scheduled: . pantoprazole (PROTONIX) IV  40 mg Intravenous QHS  . senna-docusate  1 tablet Oral BID   Continuous: . clevidipine 2 mg/hr (11/28/18 0600)  . sodium chloride (hypertonic) 50 mL/hr at 11/28/18 0600   TKZ:[id number] **OR** acetaminophen (TYLENOL) oral liquid 160 mg/5 mL **OR** acetaminophen Anti-infectives (From admission, onward)   None  Results for orders placed or performed during the hospital encounter of 11/27/18 (from the past 48 hour(s))  Protime-INR     Status: Abnormal   Collection Time: 11/27/18  7:05 PM  Result Value Ref Range   Prothrombin Time 17.4 (H) 11.4 - 15.2 seconds   INR 1.44     Comment: Performed at [facility], [place] [address], [place], [place] [address]  APTT     Status: Abnormal   Collection Time: 11/27/18  7:05 PM  Result Value Ref Range   aPTT 45 (H) 24 - 36 seconds    Comment:        IF BASELINE aPTT IS ELEVATED, SUGGEST PATIENT RISK ASSESSMENT BE USED TO DETERMINE APPROPRIATE ANTICOAGULANT THERAPY. Performed at [facility], [place] [address] [place], [place] [address]   CBC      Status: Abnormal   Collection Time: 11/27/18  7:05 PM  Result Value Ref Range   WBC 3.9 (L) 4.0 - 10.5 K/uL   RBC 4.55 4.22 - 5.81 MIL/uL   Hemoglobin 14.1 13.0 - 17.0 g/dL   HCT 43.6 39.0 - 52.0 %   MCV 95.8 80.0 - 100.0 fL   MCH 31.0 26.0 - 34.0 pg   MCHC 32.3 30.0 - 36.0 g/dL   RDW 13.4 11.5 - 15.5 %   Platelets  150 - 400 K/uL    PLATELET CLUMPS NOTED ON SMEAR, COUNT APPEARS DECREASED    Comment: REPEATED TO VERIFY SPECIMEN CHECKED FOR CLOTS Immature Platelet Fraction may be clinically indicated, consider ordering this additional test [id number]    nRBC 0.0 0.0 - 0.2 %    Comment: Performed at [facility], [place] [address], [place], [place] [address]  Differential     Status: None   Collection Time: 11/27/18  7:05 PM  Result Value Ref Range   Neutrophils Relative % 43 %   Neutro Abs 1.7 1.7 - 7.7 K/uL   Lymphocytes Relative 36 %   Lymphs Abs 1.4 0.7 - 4.0 K/uL   Monocytes Relative 17 %   Monocytes Absolute 0.7 0.1 - 1.0 K/uL   Eosinophils Relative 3 %   Eosinophils Absolute 0.1 0.0 - 0.5 K/uL   Basophils Relative 1 %   Basophils Absolute 0.0 0.0 - 0.1 K/uL   Immature Granulocytes 0 %   Abs Immature Granulocytes 0.01 0.00 - 0.07 K/uL    Comment: Performed at [facility], [place] [address] [place], [place] [address]  Comprehensive metabolic panel     Status: Abnormal   Collection Time: 11/27/18  7:05 PM  Result Value Ref Range   Sodium 131 (L) 135 - 145 mmol/L   Potassium 4.0 3.5 - 5.1 mmol/L   Chloride 98 98 - 111 mmol/L   CO2 24 22 - 32 mmol/L   Glucose, Bld 90 70 - 99 mg/dL   BUN 14 8 - 23 mg/dL   Creatinine, Ser 1.33 (H) 0.61 - 1.24 mg/dL   Calcium 8.5 (L) 8.9 - 10.3 mg/dL   Total Protein 8.4 (H) 6.5 - 8.1 g/dL   Albumin 3.4 (L) 3.5 - 5.0 g/dL   AST 105 (H) 15 - 41 U/L   ALT 82 (H) 0 - 44 U/L   Alkaline Phosphatase 48 38 - 126 U/L   Total Bilirubin 1.3 (H) 0.3 - 1.2 mg/dL   GFR calc non Af Amer 54 (L) >60 mL/min   GFR calc Af Amer >60 >60  mL/min   Anion gap  9 5 - 15    Comment: Performed at [facility], [place] [address], [place], [place] [address]  I-stat troponin, ED     Status: None   Collection Time: 11/27/18  7:12 PM  Result Value Ref Range   Troponin i, poc 0.01 0.00 - 0.08 ng/mL   Comment 3            Comment: Due to the release kinetics of cTnI, a negative result within the first hours of the onset of symptoms does not rule out myocardial infarction with certainty. If myocardial infarction is still suspected, repeat the test at appropriate intervals.   I-Stat Chem 8, ED     Status: Abnormal   Collection Time: 11/27/18  7:14 PM  Result Value Ref Range   Sodium 137 135 - 145 mmol/L   Potassium 3.6 3.5 - 5.1 mmol/L   Chloride 100 98 - 111 mmol/L   BUN 16 8 - 23 mg/dL   Creatinine, Ser 1.40 (H) 0.61 - 1.24 mg/dL   Glucose, Bld 87 70 - 99 mg/dL   Calcium, Ion 1.02 (L) 1.15 - 1.40 mmol/L   TCO2 25 22 - 32 mmol/L   Hemoglobin 15.0 13.0 - 17.0 g/dL   HCT 44.0 39.0 - 52.0 %  MRSA PCR Screening     Status: None   Collection Time: 11/27/18  9:17 PM  Result Value Ref Range   MRSA by PCR NEGATIVE NEGATIVE    Comment:        The GeneXpert MRSA Assay (FDA approved for NASAL specimens only), is one component of a comprehensive MRSA colonization surveillance program. It is not intended to diagnose MRSA infection nor to guide or monitor treatment for MRSA infections. Performed at [facility], [place] [address], [place],  [address]   Sodium     Status: None   Collection Time: 11/28/18  3:45 AM  Result Value Ref Range   Sodium 136 135 - 145 mmol/L    Comment: Performed at [facility], [place] [address], [place],  [address]    Ct Head Wo Contrast  Result Date: 11/28/2018 CLINICAL DATA:  Follow-up examination for intracerebral hemorrhage. EXAM: CT HEAD WITHOUT CONTRAST TECHNIQUE: Contiguous axial images were obtained from the base of the skull through the vertex without intravenous  contrast. COMPARISON:  Prior CT from 11/27/2018. FINDINGS: Brain: Acute intraparenchymal hemorrhage centered at the right lentiform nucleus stable in size measuring 4.4 x 2.5 x 3.7 cm. Surrounding low-density vasogenic edema mildly increased similar regional mass effect with partial effacement of the right lateral ventricle and 2 mm right-to-left shift. No hydrocephalus or ventricular trapping. Basilar cisterns remain patent. No intraventricular extension or other complication identified. Few additional tiny foci of subarachnoid blood noted within the left cerebral hemisphere (series 3, image 20, 26), grossly similar to previous. No other new acute intracranial hemorrhage. No acute large vessel territory infarct. Underlying atrophy with chronic microvascular ischemic disease again noted. No extra-axial fluid collection. Vascular: No hyperdense vessel. Calcified atherosclerosis at the skull base. Skull: Scalp soft tissues and calvarium within normal limits. Sinuses/Orbits: Globes and orbital soft tissues within normal limits. Paranasal sinuses and mastoid air cells are clear. Other: None. IMPRESSION: 1. No significant interval change in size and appearance of intraparenchymal hemorrhage centered at the right basal ganglia, with similar localized edema and regional mass effect. Associated 2 mm right-to-left midline shift unchanged. 2. Otherwise stable appearance of the brain with no other new acute intracranial abnormality identified.  Electronically Signed   By: Jeannine Boga M.D.   On: 11/28/2018 01:34   Ct Head Wo Contrast  Result Date: 11/27/2018 CLINICAL DATA:  73 y/o M; condition deteriorating. Intracranial hemorrhage. EXAM: CT HEAD WITHOUT CONTRAST TECHNIQUE: Contiguous axial images were obtained from the base of the skull through the vertex without intravenous contrast. COMPARISON:  11/27/2017 CT head. FINDINGS: Brain: Hemorrhage centered within the right lentiform nucleus measures 4.4 x 2.5 x 3.7 cm  (volume = 21 cm^3)(AP x ML x CC series 3, image 19 and series 6, image 24). Surrounding edema and associated mass effect is also mildly increased with 2 mm right-to-left midline shift increased effacement of the right lateral ventricle. No intraventricular subarachnoid hemorrhage identified. 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 Stable chronic microvascular ischemic changes and volume loss of the brain. Stable small density in the left frontal white matter corresponding to cavernoma on prior MRI. Vascular: Calcific atherosclerosis of carotid siphons. No hyperdense vessel. Skull: Normal. Negative for fracture or focal lesion. Sinuses/Orbits: No acute finding. Other: None. IMPRESSION: 1. Mild increase in size of hemorrhage within the right lentiform nucleus measuring up to 4.4 cm, 21 cc. Previously 3.9 cm and 17 cc. 2. Mildly increased associated mass effect with increased effacement of right lateral ventricle and 2 mm right-to-left midline shift. These results were called by telephone at the time of interpretation on 11/27/2018 at 8:35 pm to Dr. Kerney Elbe , who verbally acknowledged these results. Electronically Signed   By: Kristine Garbe M.D.   On: 11/27/2018 20:39   Ct Head Code Stroke Wo Contrast  Result Date: 11/27/2018 CLINICAL DATA:  Code stroke. 73 y/o M; left-sided weakness and facial droop. EXAM: CT HEAD WITHOUT CONTRAST TECHNIQUE: Contiguous axial images were obtained from the base of the skull through the vertex without intravenous contrast. COMPARISON:  None. FINDINGS: Brain: Acute hemorrhage centered within the right lentiform nucleus measuring 3.9 x 2.3 x 3.7 cm (volume = 17 cm^3), series 3, image 17 and series 6, image 20. Mild surrounding edema. Associated local mass effect partially effaces the right lateral ventricle. No significant midline shift. No downward herniation. No intraventricular hemorrhage or subarachnoid hemorrhage. Stable background of chronic  microvascular ischemic changes and volume loss of the brain. Small density within the left frontal lobe corresponds to cavernoma on prior MRI of the head. Vascular: Calcific atherosclerosis of carotid siphons. No hyperdense vessel identified. Skull: Normal. Negative for fracture or focal lesion. Sinuses/Orbits: No acute finding. Other: None. ASPECTS [facility] Stroke Program Early CT Score) - Ganglionic level infarction (caudate, lentiform nuclei, internal capsule, insula, M1-M3 cortex): 7 - Supraganglionic infarction (M4-M6 cortex): 3 Total score (0-10 with 10 being normal): 10 IMPRESSION: 1. Acute hemorrhage within the right lentiform nucleus measuring up to 3.9 cm, 17 cc. 2. Associated edema and mass effect results in partial effacement of the right lateral ventricle. No midline shift or herniation. 3. ASPECTS is not applicable. 4. Stable background of chronic microvascular ischemic changes and volume loss of the brain. Left frontal cavernoma again noted. Critical Value/emergent results were called by telephone at the time of interpretation on 11/27/2018 at 7:06 pm to Dr. Dr. Rory Percy, who verbally acknowledged these results. Electronically Signed   By: Kristine Garbe M.D.   On: 11/27/2018 19:11    ROS: As above Blood pressure 129/73, pulse 78, temperature 99.2 F (37.3 C), temperature source Oral, resp. rate 15, weight (S) 74.9 kg, SpO2 91 %. Estimated body mass index is 25.86 kg/m as calculated  from the following:   Height as of 11/09/18: 5\' 7"  (1.702 m).   Weight as of this encounter: 74.9 kg.  Physical Exam  General: An alert and pleasant 73 year old white male in no apparent distress with multiple family members at the bedside  HEENT: Normocephalic, atraumatic, pupils equal round reactive light, extraocular muscles are intact  Neck: Supple without deformity  Thorax: Symmetric  Abdomen: Soft  Extremities: Unremarkable  Neurologic exam: Glasgow Coma Scale 15.  The patient is alert  and oriented x3.  Cranial nerves II through XII were examined bilaterally grossly normal except he has a left facial droop.  The patient speech is normal.  The patient is left hemiparetic with approximately 4/5 strength in his left hand grip and gastrocnemius.  He follows commands in all 4 extremities.  He has decreased sensation in his left lower extremity.  I have reviewed the patient's 3 head CT[facility] performed [facility] at [facility].  They demonstrate a right brain hemorrhage with mild mass-effect and midline shift.  There is no hydrocephalus or intraventricular hemorrhage.  Assessment/Plan: Right basal brain hemorrhage: The patient is clinically and radiographically stable.  This is not an operable hemorrhage.  He has no evidence of hydrocephalus.  I have answered all the patient's, and his family's, questions.  I will let Dr. Kathyrn Sheriff know the patient has been admitted since he is an established patient of his.  Please call Dr. Kathyrn Sheriff if neurosurgery can be of further assistance.  I will sign off.  Ophelia Charter 11/28/2018, 7:41 AM

## 2018-11-28 NOTE — Progress Notes (Signed)
Rehab Admissions Coordinator Note:  Patient was screened by Cleatrice Burke for appropriateness for an Inpatient Acute Rehab Consult per OT rec.  At this time, we are recommending Inpatient Rehab consult.  Cleatrice Burke RN MSN 11/28/2018, 8:05 PM  I can be reached at [phone].

## 2018-11-29 ENCOUNTER — Telehealth: Payer: Self-pay | Admitting: Cardiovascular Disease

## 2018-11-29 DIAGNOSIS — I10 Essential (primary) hypertension: Secondary | ICD-10-CM

## 2018-11-29 DIAGNOSIS — E871 Hypo-osmolality and hyponatremia: Secondary | ICD-10-CM

## 2018-11-29 DIAGNOSIS — M069 Rheumatoid arthritis, unspecified: Secondary | ICD-10-CM

## 2018-11-29 DIAGNOSIS — I4891 Unspecified atrial fibrillation: Secondary | ICD-10-CM

## 2018-11-29 DIAGNOSIS — N179 Acute kidney failure, unspecified: Secondary | ICD-10-CM

## 2018-11-29 DIAGNOSIS — Q283 Other malformations of cerebral vessels: Secondary | ICD-10-CM

## 2018-11-29 DIAGNOSIS — I619 Nontraumatic intracerebral hemorrhage, unspecified: Secondary | ICD-10-CM

## 2018-11-29 DIAGNOSIS — R0682 Tachypnea, not elsewhere classified: Secondary | ICD-10-CM

## 2018-11-29 DIAGNOSIS — E785 Hyperlipidemia, unspecified: Secondary | ICD-10-CM

## 2018-11-29 DIAGNOSIS — I610 Nontraumatic intracerebral hemorrhage in hemisphere, subcortical: Principal | ICD-10-CM

## 2018-11-29 LAB — SODIUM
Sodium: 136 mmol/L (ref 135–145)
Sodium: 135 mmol/L (ref 135–145)

## 2018-11-29 LAB — LIPID PANEL
Cholesterol: 137 mg/dL (ref 0–200)
HDL: 42 mg/dL (ref >40)
LDL Cholesterol: 76 mg/dL (ref 0–99)
Total CHOL/HDL Ratio: 3.3 RATIO
Triglycerides: 94 mg/dL (ref <150)
VLDL: 19 mg/dL (ref 0–40)

## 2018-11-29 LAB — TRIGLYCERIDES: Triglycerides: 94 mg/dL (ref <150)

## 2018-11-29 NOTE — Progress Notes (Signed)
  Speech Language Pathology Treatment: Dysphagia  Patient Details Name: Barry Woods MRN: [id number] DOB: [birth year] Today's Date: 11/29/2018 Time: 1040-1100 SLP Time Calculation (min) (ACUTE ONLY): 20 min  Assessment / Plan / Recommendation Clinical Impression  Reinforced strategies to reduce trace aspiration events, particularly chin tuck and harder throat clearing. Pt demonstrated with min verbal cues. Also emphasized oral hygiene. Pt was showing similar signs PTA which was suspected. Increased risk given medical debility. Will continue interventions to encourage pulmonary hygiene.   HPI HPI: The patient is a 73 year old white male who was admitted yesterday with a right basal ganglia hemorrhage.      SLP Plan  Continue with current plan of care       Recommendations  Diet recommendations: Regular;Thin liquid Liquids provided via: Cup;Straw Medication Administration: Whole meds with puree Supervision: Patient able to self feed Compensations: Slow rate;Small sips/bites;Chin tuck;Clear throat intermittently Postural Changes and/or Swallow Maneuvers: Seated upright 90 degrees                General recommendations: Rehab consult Follow up Recommendations: Inpatient Rehab SLP Visit Diagnosis: Dysphagia, oropharyngeal phase (R13.12) Plan: Continue with current plan of care       GO               Barry [place], MA [place] Pager [phone] Office [phone]  Lynann Beaver 11/29/2018, 11:32 AM

## 2018-11-29 NOTE — Consult Note (Signed)
Physical Medicine and Rehabilitation Consult Reason for Consult:  Left side weakness Referring Physician: Dr. Leonie Man   HPI: Barry Woods is a 73 y.o.right handed male history of hyperlipidemia, hypertension, rheumatoid arthritis maintained on chronic prednisone , chronic atrial fibrillation maintained on Eliquis. Per chart review and family, patient lives alone independent prior to admission. Multilevel home bedroom bath upstairs.Presented 11/27/2017 with left-sided weakness/slurred speech as well as a fall. Cranial CT scan reviewed, showing right brain hemorrhage.  Per report, acute hemorrhage within the right lentiform nucleus measuring up to 3.9 cm. Associated edema and mass effect. Patient did receive Andexxa to reverse anticoagulation.noted mild hyponatremia 131 as well as creatinine 1.33. neurosurgery Dr. Earle Gell consulted in regards to hemorrhage. Latest follow-up MRI without change in right basal ganglia hematoma. There was a noted 4 mm acute infarction in the medial left subcortical white matter. Maintained on Cleviprex for blood pressure control. Therapy evaluations completed with recommendations of physical medicine rehabilitation consult.  Review of Systems  Constitutional: Negative for chills and fever.  HENT: Negative for hearing loss.   Eyes: Negative for blurred vision and double vision.  Respiratory: Negative for cough and shortness of breath.   Gastrointestinal: Positive for constipation. Negative for nausea and vomiting.  Genitourinary: Negative for dysuria, flank pain and hematuria.  Musculoskeletal: Positive for falls and myalgias.  Skin: Negative for rash.  Neurological: Positive for sensory change, speech change, focal weakness and headaches.  All other systems reviewed and are negative.  Past Medical History:  Diagnosis Date  . Arthritis   . Cerebral cavernoma   . Hyperlipidemia   . Hypertension   . Rheumatoid arthritis [facility])    Past Surgical  History:  Procedure Laterality Date  . COLONOSCOPY    . LIPOMA EXCISION    . PALATE / UVULA BIOPSY / EXCISION    . TONSILLECTOMY     age 62   Family History  Problem Relation Age of Onset  . Colon cancer Mother 55       57  . Hepatitis C Father   . CAD Father    Social History:  reports that he has never smoked. He has never used smokeless tobacco. He reports current alcohol use of about 14.0 standard drinks of alcohol per week. He reports that he does not use drugs.  Allergies:  Allergies  Allergen Reactions  . Atorvastatin Hives  . Cetirizine Hcl Hives   Medications Prior to Admission  Medication Sig Dispense Refill  . Adalimumab (HUMIRA) 40 MG/0.8ML PSKT Inject 40 mg into the skin See admin instructions. Every other week     . apixaban (ELIQUIS) 5 MG TABS tablet Take 1 tablet (5 mg total) by mouth 2 (two) times daily. 60 tablet 11  . atenolol (TENORMIN) 50 MG tablet Take 1.5 tablets (75 mg total) by mouth daily. 135 tablet 1  . furosemide (LASIX) 40 MG tablet Take 0.5 tablets (20 mg total) by mouth daily. (Patient taking differently: Take 40 mg by mouth daily. ) 45 tablet 3  . potassium chloride SA (K-DUR,KLOR-CON) 20 MEQ tablet Take 0.5 tablets (10 mEq total) by mouth daily. (Patient taking differently: Take 20 mEq by mouth daily. ) 45 tablet 3  . predniSONE (DELTASONE) 10 MG tablet Take 10-40 mg by mouth See admin instructions. Take 4 tabs daily for 2 days, then 3 tabs daily for 2 days then 2 tabs daily for 2 days then 1 tab daily for 2 days started on 11/10/18    .  simvastatin (ZOCOR) 20 MG tablet Take 1 tablet (20 mg total) by mouth at bedtime. 100 tablet 4    Home: Home Living Family/patient expects to be discharged to:: Private residence Living Arrangements: Alone Available Help at Discharge: Family Type of Home: House Home Access: Level entry Home Layout: Multi-level, Bed/bath upstairs, 1/2 bath on main level Alternate Level Stairs-Number of Steps: 2 flights to  bedroom Home Equipment: None  Functional History: Prior Function Level of Independence: Independent Functional Status:  Mobility: Bed Mobility Overal bed mobility: Needs Assistance Bed Mobility: Supine to Sit Supine to sit: Mod assist General bed mobility comments: mod assist for trunk support and L LE towards EOB, scooting foward  Transfers Overall transfer level: Needs assistance Equipment used: 1 person hand held assist Transfers: Sit to/from Stand, Google Transfers Sit to Stand: Mod assist, +2 physical assistance, +2 safety/equipment Squat pivot transfers: Max assist, +2 physical assistance, +2 safety/equipment General transfer comment: mod assist +2 to stand with L lateral lean and poor awareness to midline positioning, max assist for squat pivot towards R side onto commode       ADL: ADL Overall ADL's : Needs assistance/impaired Eating/Feeding: Set up, Supervision/ safety, Sitting Grooming: Minimal assistance, Sitting Grooming Details (indicate cue type and reason): L lateral lean in unsupported sitting  Upper Body Bathing: Sitting, Minimal assistance Lower Body Bathing: Maximal assistance, +2 for physical assistance, +2 for safety/equipment, Sit to/from stand Upper Body Dressing : Sitting, Moderate assistance Lower Body Dressing: Maximal assistance, +2 for physical assistance, +2 for safety/equipment, Sit to/from stand Toilet Transfer: Maximal assistance, +2 for physical assistance, +2 for safety/equipment, Squat-pivot, Comfort height toilet Functional mobility during ADLs: Maximal assistance, +2 for physical assistance, +2 for safety/equipment General ADL Comments: patient determined to use regular commode, but refused to use with therapist present; but presents with L lateral lean in sitting and poor awareness of deficits and safety  Cognition: Cognition Overall Cognitive Status: Impaired/Different from baseline Orientation Level: Oriented  X4 Cognition Arousal/Alertness: Awake/alert Behavior During Therapy: Impulsive Overall Cognitive Status: Impaired/Different from baseline Area of Impairment: Attention, Following commands, Memory, Safety/judgement, Awareness, Problem solving Current Attention Level: Sustained Memory: Decreased recall of precautions, Decreased short-term memory Following Commands: Follows one step commands with increased time Safety/Judgement: Decreased awareness of safety, Decreased awareness of deficits Awareness: Emergent Problem Solving: Difficulty sequencing, Requires verbal cues General Comments: Patient impulsive throughout session, decreased awareness of deficits and assistance needed for mobility.   Blood pressure 128/84, pulse 70, temperature 98.2 F (36.8 C), temperature source Oral, resp. rate (!) 21, height 5\' 7"  (1.702 m), weight (S) 74.9 kg, SpO2 95 %. Physical Exam  Vitals reviewed. Constitutional: He is oriented to person, place, and time. He appears well-developed and well-nourished.  HENT:  Head: Normocephalic and atraumatic.  Eyes: EOM are normal. Right eye exhibits no discharge. Left eye exhibits no discharge.  Pupils reactive to light  Neck: Normal range of motion. Neck supple. No thyromegaly present.  Cardiovascular:  Irregularly irregular  Respiratory: Effort normal and breath sounds normal. No respiratory distress.  GI: Soft. Bowel sounds are normal. He exhibits no distension.  Musculoskeletal:     Comments: No edema or tenderness in extremities  Neurological: He is alert and oriented to person, place, and time.  Patient is alert.  Speech is a bit dysarthric but intelligible.  Follows full commands.  Provides his name , age and date of birth. Left facial weakness Right upper extremity/right lower extremity: 5/5 proximal distal Left upper extremity/left lower extremity: 4-/5  proximal distal Sensation diminished to light touch LUE/LLE  Skin: Skin is warm and dry.   Psychiatric: His behavior is normal. His affect is blunt.    Results for orders placed or performed during the hospital encounter of 11/27/18 (from the past 24 hour(s))  Sodium     Status: None   Collection Time: 11/28/18  8:10 AM  Result Value Ref Range   Sodium 138 135 - 145 mmol/L  Sodium     Status: None   Collection Time: 11/28/18  3:25 PM  Result Value Ref Range   Sodium 140 135 - 145 mmol/L  Sodium     Status: None   Collection Time: 11/28/18  8:10 PM  Result Value Ref Range   Sodium 138 135 - 145 mmol/L  Sodium     Status: None   Collection Time: 11/29/18  4:30 AM  Result Value Ref Range   Sodium 136 135 - 145 mmol/L  Lipid panel     Status: None   Collection Time: 11/29/18  4:30 AM  Result Value Ref Range   Cholesterol 137 0 - 200 mg/dL   Triglycerides 94 <150 mg/dL   HDL 42 >40 mg/dL   Total CHOL/HDL Ratio 3.3 RATIO   VLDL 19 0 - 40 mg/dL   LDL Cholesterol 76 0 - 99 mg/dL  Triglycerides     Status: None   Collection Time: 11/29/18  4:30 AM  Result Value Ref Range   Triglycerides 94 <150 mg/dL   Ct Head Wo Contrast  Result Date: 11/28/2018 CLINICAL DATA:  Follow-up examination for intracerebral hemorrhage. EXAM: CT HEAD WITHOUT CONTRAST TECHNIQUE: Contiguous axial images were obtained from the base of the skull through the vertex without intravenous contrast. COMPARISON:  Prior CT from 11/27/2018. FINDINGS: Brain: Acute intraparenchymal hemorrhage centered at the right lentiform nucleus stable in size measuring 4.4 x 2.5 x 3.7 cm. Surrounding low-density vasogenic edema mildly increased similar regional mass effect with partial effacement of the right lateral ventricle and 2 mm right-to-left shift. No hydrocephalus or ventricular trapping. Basilar cisterns remain patent. No intraventricular extension or other complication identified. Few additional tiny foci of subarachnoid blood noted within the left cerebral hemisphere (series 3, image 20, 26), grossly similar to  previous. No other new acute intracranial hemorrhage. No acute large vessel territory infarct. Underlying atrophy with chronic microvascular ischemic disease again noted. No extra-axial fluid collection. Vascular: No hyperdense vessel. Calcified atherosclerosis at the skull base. Skull: Scalp soft tissues and calvarium within normal limits. Sinuses/Orbits: Globes and orbital soft tissues within normal limits. Paranasal sinuses and mastoid air cells are clear. Other: None. IMPRESSION: 1. No significant interval change in size and appearance of intraparenchymal hemorrhage centered at the right basal ganglia, with similar localized edema and regional mass effect. Associated 2 mm right-to-left midline shift unchanged. 2. Otherwise stable appearance of the brain with no other new acute intracranial abnormality identified. Electronically Signed   By: Jeannine Boga M.D.   On: 11/28/2018 01:34   Ct Head Wo Contrast  Result Date: 11/27/2018 CLINICAL DATA:  73 y/o M; condition deteriorating. Intracranial hemorrhage. EXAM: CT HEAD WITHOUT CONTRAST TECHNIQUE: Contiguous axial images were obtained from the base of the skull through the vertex without intravenous contrast. COMPARISON:  11/27/2017 CT head. FINDINGS: Brain: Hemorrhage centered within the right lentiform nucleus measures 4.4 x 2.5 x 3.7 cm (volume = 21 cm^3)(AP x ML x CC series 3, image 19 and series 6, image 24). Surrounding edema and associated mass effect is also  mildly increased with 2 mm right-to-left midline shift increased effacement of the right lateral ventricle. No intraventricular subarachnoid hemorrhage identified. 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 Stable chronic microvascular ischemic changes and volume loss of the brain. Stable small density in the left frontal white matter corresponding to cavernoma on prior MRI. Vascular: Calcific atherosclerosis of carotid siphons. No hyperdense vessel. Skull: Normal. Negative  for fracture or focal lesion. Sinuses/Orbits: No acute finding. Other: None. IMPRESSION: 1. Mild increase in size of hemorrhage within the right lentiform nucleus measuring up to 4.4 cm, 21 cc. Previously 3.9 cm and 17 cc. 2. Mildly increased associated mass effect with increased effacement of right lateral ventricle and 2 mm right-to-left midline shift. These results were called by telephone at the time of interpretation on 11/27/2018 at 8:35 pm to Dr. Kerney Elbe , who verbally acknowledged these results. Electronically Signed   By: Kristine Garbe M.D.   On: 11/27/2018 20:39   Mr Jodene Nam Head Wo Contrast  Result Date: 11/28/2018 CLINICAL DATA:  Follow-up intraparenchymal hemorrhage in the right basal ganglia. EXAM: MRI HEAD WITHOUT CONTRAST MRA HEAD WITHOUT CONTRAST TECHNIQUE: Multiplanar, multiecho pulse sequences of the brain and surrounding structures were obtained without intravenous contrast. Angiographic images of the head were obtained using MRA technique without contrast. COMPARISON:  CT same day FINDINGS: MRI HEAD FINDINGS Brain: Diffusion imaging shows a 4 mm acute infarction in the left medial frontal subcortical white matter. Right basal ganglia hematoma measuring approximately 3.7 x 4.4 x 2.5 cm as seen on the previous CT. Surrounding edema. No evidence of an underlying mass lesion or high flow vascular malformation. Old hemosiderin deposition related to previous hemorrhage in the left basal ganglia/external capsule, left frontal white matter, the right lateral thalamus and the right posteromedial temporal lobe. Moderate chronic small-vessel ischemic changes affect the cerebral hemispheric white matter. No hydrocephalus or extra-axial collection. Right-to-left shift of 2-3 mm is unchanged. Vascular: Major vessels at the base of the brain show flow. Skull and upper cervical spine: Negative Sinuses/Orbits: Clear/normal Other: None MRA HEAD FINDINGS Both internal carotid arteries are widely  patent through the skull base and siphon regions. The anterior and middle cerebral vessels are patent without proximal stenosis, aneurysm or vascular malformation. Posterior cerebral arteries receive most of there supply from the anterior circulation. Both vertebral arteries are patent to the basilar. No basilar stenosis. Posterior circulation branch vessels are patent. IMPRESSION: MRI head: Right basal ganglia hematoma as seen previously without apparent enlargement. Surrounding edema. Mild mass effect with right-to-left shift of 2-3 mm. No sign of underlying tumor or high flow vascular malformation. Chronic small-vessel ischemic changes elsewhere within the brain. Other foci of hemosiderin deposition consistent with old prior hemorrhage. The tendency to hemorrhage within the brain suggest the possibility of amyloid angiopathy. 4 mm acute infarction in the medial left subcortical white matter. Moderate chronic small-vessel ischemic changes elsewhere throughout the brain. Electronically Signed   By: Nelson Chimes M.D.   On: 11/28/2018 14:26   Mr Brain Wo Contrast  Result Date: 11/28/2018 CLINICAL DATA:  Follow-up intraparenchymal hemorrhage in the right basal ganglia. EXAM: MRI HEAD WITHOUT CONTRAST MRA HEAD WITHOUT CONTRAST TECHNIQUE: Multiplanar, multiecho pulse sequences of the brain and surrounding structures were obtained without intravenous contrast. Angiographic images of the head were obtained using MRA technique without contrast. COMPARISON:  CT same day FINDINGS: MRI HEAD FINDINGS Brain: Diffusion imaging shows a 4 mm acute infarction in the left medial frontal subcortical white matter. Right basal ganglia  hematoma measuring approximately 3.7 x 4.4 x 2.5 cm as seen on the previous CT. Surrounding edema. No evidence of an underlying mass lesion or high flow vascular malformation. Old hemosiderin deposition related to previous hemorrhage in the left basal ganglia/external capsule, left frontal white  matter, the right lateral thalamus and the right posteromedial temporal lobe. Moderate chronic small-vessel ischemic changes affect the cerebral hemispheric white matter. No hydrocephalus or extra-axial collection. Right-to-left shift of 2-3 mm is unchanged. Vascular: Major vessels at the base of the brain show flow. Skull and upper cervical spine: Negative Sinuses/Orbits: Clear/normal Other: None MRA HEAD FINDINGS Both internal carotid arteries are widely patent through the skull base and siphon regions. The anterior and middle cerebral vessels are patent without proximal stenosis, aneurysm or vascular malformation. Posterior cerebral arteries receive most of there supply from the anterior circulation. Both vertebral arteries are patent to the basilar. No basilar stenosis. Posterior circulation branch vessels are patent. IMPRESSION: MRI head: Right basal ganglia hematoma as seen previously without apparent enlargement. Surrounding edema. Mild mass effect with right-to-left shift of 2-3 mm. No sign of underlying tumor or high flow vascular malformation. Chronic small-vessel ischemic changes elsewhere within the brain. Other foci of hemosiderin deposition consistent with old prior hemorrhage. The tendency to hemorrhage within the brain suggest the possibility of amyloid angiopathy. 4 mm acute infarction in the medial left subcortical white matter. Moderate chronic small-vessel ischemic changes elsewhere throughout the brain. Electronically Signed   By: Nelson Chimes M.D.   On: 11/28/2018 14:26   Dg Swallowing Func-speech Pathology  Result Date: 11/28/2018 Objective Swallowing Evaluation: Type of Study: MBS-Modified Barium Swallow Study  Patient Details Name: RACHAEL ZAPANTA MRN: [id number] Date of Birth: 15-Oct-1946 Today's Date: 11/28/2018 Time: SLP Start Time (ACUTE ONLY): 1400 -SLP Stop Time (ACUTE ONLY): 1430 SLP Time Calculation (min) (ACUTE ONLY): 30 min Past Medical History: Past Medical History: Diagnosis Date  . Arthritis  . Cerebral cavernoma  . Hyperlipidemia  . Hypertension  . Rheumatoid arthritis [facility])  Past Surgical History: Past Surgical History: Procedure Laterality Date . COLONOSCOPY   . LIPOMA EXCISION   . PALATE / UVULA BIOPSY / EXCISION   . TONSILLECTOMY    age 37 HPI: The patient is a 73 year old white male who was admitted yesterday with a right basal ganglia hemorrhage.  No data recorded Assessment / Plan / Recommendation CHL IP CLINICAL IMPRESSIONS 11/28/2018 Clinical Impression Pt demonstrates mild pharyngeal weakness with decreased effort and duration of laryngeal elevation and hyoid excursion. This leads to mild vallecular residue and slightly decreased laryngeal closure that at times leads to trace penetration and aspiraiton events during and after the swallow. The pt senses this penetrate with a throat clear, though needs cues to apply force to fully eject. A chin tuck was beneficial in decreased residue and improving laryngeal protection. recommend the pt initiate a mechanical soft diet and thin liquids with a chin tuck and intermittent throat clearing to eject aspirate if present. Would benefit from EMST to target stegth and respiratory drive. Will follow for tolerance.  SLP Visit Diagnosis Dysphagia, oropharyngeal phase (R13.12) Attention and concentration deficit following -- Frontal lobe and executive function deficit following -- Impact on safety and function Moderate aspiration risk   CHL IP TREATMENT RECOMMENDATION 11/28/2018 Treatment Recommendations Therapy as outlined in treatment plan below   Prognosis 11/28/2018 Prognosis for Safe Diet Advancement Good Barriers to Reach Goals -- Barriers/Prognosis Comment -- CHL IP DIET RECOMMENDATION 11/28/2018 SLP Diet Recommendations Dysphagia 3 (Mech soft) solids;Thin  liquid Liquid Administration via Cup;Straw Medication Administration Whole meds with liquid Compensations Slow rate;Small sips/bites;Chin tuck;Clear throat intermittently Postural Changes Remain  semi-upright after after feeds/meals (Comment)   No flowsheet data found.  CHL IP FOLLOW UP RECOMMENDATIONS 11/28/2018 Follow up Recommendations Inpatient Rehab   CHL IP FREQUENCY AND DURATION 11/28/2018 Speech Therapy Frequency (ACUTE ONLY) min 2x/week Treatment Duration 2 weeks      CHL IP ORAL PHASE 11/28/2018 Oral Phase Impaired Oral - Pudding Teaspoon -- Oral - Pudding Cup -- Oral - Honey Teaspoon -- Oral - Honey Cup -- Oral - Nectar Teaspoon -- Oral - Nectar Cup -- Oral - Nectar Straw -- Oral - Thin Teaspoon -- Oral - Thin Cup -- Oral - Thin Straw -- Oral - Puree -- Oral - Mech Soft -- Oral - Regular -- Oral - Multi-Consistency -- Oral - Pill -- Oral Phase - Comment slightly increased effort to masticate solids  CHL IP PHARYNGEAL PHASE 11/28/2018 Pharyngeal Phase Impaired Pharyngeal- Pudding Teaspoon -- Pharyngeal -- Pharyngeal- Pudding Cup -- Pharyngeal -- Pharyngeal- Honey Teaspoon -- Pharyngeal -- Pharyngeal- Honey Cup -- Pharyngeal -- Pharyngeal- Nectar Teaspoon Reduced epiglottic inversion;Reduced anterior laryngeal mobility;Pharyngeal residue - valleculae;Reduced tongue base retraction Pharyngeal -- Pharyngeal- Nectar Cup -- Pharyngeal -- Pharyngeal- Nectar Straw Reduced epiglottic inversion;Reduced anterior laryngeal mobility;Pharyngeal residue - valleculae;Reduced tongue base retraction Pharyngeal -- Pharyngeal- Thin Teaspoon -- Pharyngeal -- Pharyngeal- Thin Cup Reduced epiglottic inversion;Reduced anterior laryngeal mobility;Pharyngeal residue - valleculae;Reduced tongue base retraction;Penetration/Apiration after swallow;Pharyngeal residue - pyriform Pharyngeal Material enters airway, passes BELOW cords then ejected out;Material does not enter airway Pharyngeal- Thin Straw Reduced epiglottic inversion;Reduced anterior laryngeal mobility;Pharyngeal residue - valleculae;Reduced tongue base retraction;Penetration/Aspiration during swallow;Trace aspiration Pharyngeal Material enters airway, remains ABOVE vocal  cords and not ejected out;Material enters airway, CONTACTS cords and then ejected out;Material does not enter airway Pharyngeal- Puree Reduced epiglottic inversion;Reduced anterior laryngeal mobility;Reduced tongue base retraction;Pharyngeal residue - valleculae Pharyngeal -- Pharyngeal- Mechanical Soft -- Pharyngeal -- Pharyngeal- Regular Reduced epiglottic inversion;Reduced anterior laryngeal mobility;Reduced tongue base retraction;Pharyngeal residue - valleculae Pharyngeal -- Pharyngeal- Multi-consistency -- Pharyngeal -- Pharyngeal- Pill -- Pharyngeal -- Pharyngeal Comment --  No flowsheet data found. Herbie Baltimore, MA CCC-SLP Acute Rehabilitation Services Pager [phone] Office [phone] Lynann Beaver 11/28/2018, 3:01 PM              Ct Head Code Stroke Wo Contrast  Result Date: 11/27/2018 CLINICAL DATA:  Code stroke. 73 y/o M; left-sided weakness and facial droop. EXAM: CT HEAD WITHOUT CONTRAST TECHNIQUE: Contiguous axial images were obtained from the base of the skull through the vertex without intravenous contrast. COMPARISON:  None. FINDINGS: Brain: Acute hemorrhage centered within the right lentiform nucleus measuring 3.9 x 2.3 x 3.7 cm (volume = 17 cm^3), series 3, image 17 and series 6, image 20. Mild surrounding edema. Associated local mass effect partially effaces the right lateral ventricle. No significant midline shift. No downward herniation. No intraventricular hemorrhage or subarachnoid hemorrhage. Stable background of chronic microvascular ischemic changes and volume loss of the brain. Small density within the left frontal lobe corresponds to cavernoma on prior MRI of the head. Vascular: Calcific atherosclerosis of carotid siphons. No hyperdense vessel identified. Skull: Normal. Negative for fracture or focal lesion. Sinuses/Orbits: No acute finding. Other: None. ASPECTS [facility] Stroke Program Early CT Score) - Ganglionic level infarction (caudate, lentiform nuclei, internal  capsule, insula, M1-M3 cortex): 7 - Supraganglionic infarction (M4-M6 cortex): 3 Total score (0-10 with 10 being normal): 10 IMPRESSION: 1. Acute hemorrhage within the right  lentiform nucleus measuring up to 3.9 cm, 17 cc. 2. Associated edema and mass effect results in partial effacement of the right lateral ventricle. No midline shift or herniation. 3. ASPECTS is not applicable. 4. Stable background of chronic microvascular ischemic changes and volume loss of the brain. Left frontal cavernoma again noted. Critical Value/emergent results were called by telephone at the time of interpretation on 11/27/2018 at 7:06 pm to Dr. Dr. Rory Percy, who verbally acknowledged these results. Electronically Signed   By: Kristine Garbe M.D.   On: 11/27/2018 19:11    Assessment/Plan: Diagnosis: right basal ganglia hematoma with medial left subcortical white matter infarct Labs and images (see above) independently reviewed.  Records reviewed and summated above. Stroke: Continue secondary stroke prophylaxis and Risk Factor Modification listed below:   Blood Pressure Management:  Continue current medication with prn's with permisive HTN per primary team Left sided hemiparesis Motor recovery: Fluoxetine  1. Does the need for close, 24 hr/day medical supervision in concert with the patient's rehab needs make it unreasonable for this patient to be served in a less intensive setting? Yes  2. Co-Morbidities requiring supervision/potential complications: hyponatremia (continue to monitor, treat if necessary), hyperlipidemia, HTN (monitor and provide prns in accordance with increased physical exertion and pain), rheumatoid arthritis (cont prednisone), atrial fibrillation (resume anticoagulation when appropriate, monitor heart rate with increased physical activity), tachypnea (monitor RR and O2 Sats with increased physical exertion), AKI (avoid nephrotoxic meds) 3. Due to safety, disease management and patient education, does  the patient require 24 hr/day rehab nursing? Yes 4. Does the patient require coordinated care of a physician, rehab nurse, PT (1-2 hrs/day, 5 days/week), OT (1-2 hrs/day, 5 days/week) and SLP (1-2 hrs/day, 5 days/week) to address physical and functional deficits in the context of the above medical diagnosis(es)? Yes Addressing deficits in the following areas: balance, endurance, locomotion, strength, transferring, bathing, dressing, toileting and psychosocial support 5. Can the patient actively participate in an intensive therapy program of at least 3 hrs of therapy per day at least 5 days per week? Yes 6. The potential for patient to make measurable gains while on inpatient rehab is excellent 7. Anticipated functional outcomes upon discharge from inpatient rehab are supervision and min assist  with PT, supervision and min assist with OT, independent and modified independent with SLP. 8. Estimated rehab length of stay to reach the above functional goals is: 14-18 days. 9. Anticipated D/C setting: Home 10. Anticipated post D/C treatments: HH therapy and Home excercise program 11. Overall Rehab/Functional Prognosis: good  RECOMMENDATIONS: This patient's condition is appropriate for continued rehabilitative care in the following setting: CIR when medically appropriate if necessary caregiver support and suitable discharge disposition available. Patient has agreed to participate in recommended program. Yes Note that insurance prior authorization may be required for reimbursement for recommended care.  Comment: Rehab Admissions Coordinator to follow up.   I have personally performed a face to face diagnostic evaluation, including, but not limited to relevant history and physical exam findings, of this patient and developed relevant assessment and plan.  Additionally, I have reviewed and concur with the physician assistant's documentation above.   Delice Lesch, MD, ABPMR Lavon Paganini [place],  PA-C 11/29/2018

## 2018-11-29 NOTE — Evaluation (Signed)
Speech Language Pathology Evaluation Patient Details Name: Barry Woods MRN: [id number] DOB: [birth year] Today's Date: 11/29/2018 Time: 1040-1100 SLP Time Calculation (min) (ACUTE ONLY): 20 min  Problem List:  Patient Active Problem List   Diagnosis Date Noted  . Hemorrhagic stroke ([place])   . Hyponatremia   . Dyslipidemia   . Tachypnea   . AKI (acute kidney injury) ([place])   . Cerebral cavernoma   . ICH (intracerebral hemorrhage) ([place]) 11/27/2018  . Diastolic CHF, chronic ([place]) 11/09/2018  . Atrial fibrillation ([place]) 11/07/2018  . Dyspnea on exertion 10/31/2018  . Coronary artery calcification seen on CT scan 10/31/2018  . Routine general medical examination at a health care facility 03/09/2018  . Benign essential tremor 02/16/2017  . Pure hypercholesterolemia 02/16/2017  . ILD (interstitial lung disease) ([place]) 10/21/2016  . BPH associated with nocturia 09/24/2015  . Rheumatoid arthritis ([place]) 09/28/2011  . Shoulder pain, bilateral 06/16/2011  . Gout attack 02/02/2011  . 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 LIVER FUNCTION TESTS, ABNORMAL, HX OF 11/19/2008  . Hyperlipidemia, mild 11/01/2007  . Essential hypertension 11/01/2007   Past Medical History:  Past Medical History:  Diagnosis Date  . Arthritis   . Cerebral cavernoma   . Hyperlipidemia   . Hypertension   . Rheumatoid arthritis [facility])    Past Surgical History:  Past Surgical History:  Procedure Laterality Date  . COLONOSCOPY    . LIPOMA EXCISION    . PALATE / UVULA BIOPSY / EXCISION    . TONSILLECTOMY     age 89   HPI:  The patient is a 73 year old white male who was admitted yesterday with a right basal ganglia hemorrhage.   Assessment / Plan / Recommendation Clinical Impression  Pt scored 24/30 on MOCA original version. Mild deficits noted in attending to and folloiwng multistep directions in novel tasks. Often not aware of deficits. Will benefit from higher level cognitive inteventions at next level fo care,  Recommend CIR. Discussed with pt and family.     SLP Assessment  SLP Recommendation/Assessment: Patient needs continued Speech Lanaguage Pathology Services SLP Visit Diagnosis: Attention and concentration deficit Attention and concentration deficit following: Cerebral infarction    Follow Up Recommendations  Inpatient Rehab    Frequency and Duration min 1 x/week  1 week      SLP Evaluation Cognition  Overall Cognitive Status: Impaired/Different from baseline Arousal/Alertness: Awake/alert Orientation Level: Oriented to person;Oriented to place;Disoriented to time;Oriented to situation Attention: Selective;Alternating Selective Attention: Appears intact Alternating Attention: Appears intact Memory: Appears intact Awareness: Impaired Awareness Impairment: Emergent impairment Problem Solving: Impaired Problem Solving Impairment: Functional complex;Verbal complex Safety/Judgment: Appears intact       Comprehension  Auditory Comprehension Overall Auditory Comprehension: Appears within functional limits for tasks assessed    Expression Verbal Expression Overall Verbal Expression: Appears within functional limits for tasks assessed   Oral / Motor  Oral Motor/Sensory Function Overall Oral Motor/Sensory Function: Mild impairment Facial ROM: Reduced left;Suspected CN VII (facial) dysfunction Facial Symmetry: Abnormal symmetry left;Suspected CN VII (facial) dysfunction Facial Strength: Reduced left;Suspected CN VII (facial) dysfunction Facial Sensation: Within Functional Limits Lingual ROM: Within Functional Limits Lingual Symmetry: Within Functional Limits Lingual Strength: Within Functional Limits Lingual Sensation: Within Functional Limits Velum: Within Functional Limits Mandible: Within Functional Limits Motor Speech Overall Motor Speech: Appears within functional limits for tasks assessed   GO                   Herbie [place], MA CCC-SLP  Acute Rehabilitation  Services Pager [phone] Office [phone]  Barry Woods 11/29/2018, 11:37 AM

## 2018-11-29 NOTE — Evaluation (Addendum)
Physical Therapy Evaluation Patient Details Name: Barry Woods MRN: [id number] DOB: August 26, 1946 Today's Date: 11/29/2018   History of Present Illness  73 yo admitted with left weakness and right basal ganglia hemorrhage. Pmhx: Afib, HTN, HLD, RA  Clinical Impression  Pt pleasant, alert in chair on arrival with family present. Pt with decreased strength LLE with left inattention, impaired balance, transfers and gait who will benefit from acute therapy to maximize mobility, function and balance to decrease burden of care. Pt has supportive kids but all work and family unsure if pt would have 24hr assist available at D/C. Pt with recognition of deficits and able to state need for assist today which is improved from impulsivity with OT yesterday. Pt educated for LLE HEP for strengthening as well as activity progression. LUE elevated and supported end of session. Pt also has 3 level home with bed upstairs and encouraged family to discuss other D/C options once more mobile.  BP 142/103    Follow Up Recommendations CIR;Supervision/Assistance - 24 hour    Equipment Recommendations  Rolling walker with 5" wheels;3in1 (PT)    Recommendations for Other Services Rehab consult     Precautions / Restrictions Precautions Precautions: Fall Precaution Comments: left inattention, left hemiparesis      Mobility  Bed Mobility               General bed mobility comments: in chair on arrival  Transfers Overall transfer level: Needs assistance   Transfers: Sit to/from Stand Sit to Stand: Min assist;+2 physical assistance         General transfer comment: min +2 assist to rise from chair with right armrest and cues for sequence and anterior translation to rise. In standing pt able to static stand with min assist and LUE supported grossly 30 sec  Ambulation/Gait Ambulation/Gait assistance: Mod assist;+2 physical assistance Gait Distance (Feet): 30 Feet Assistive device: 2 person hand  held assist Gait Pattern/deviations: Step-to pattern;Decreased stance time - left;Decreased weight shift to right   Gait velocity interpretation: <1.31 ft/sec, indicative of household ambulator General Gait Details: pt initiating gait with RLE with tendency for anterior lean to attempt to advance LLE, assist for weight shift to right and cues for advancing LLE. Pt with bil UE support by therapist and tech with chair follow by family. pt required sequential cues   Stairs            Wheelchair Mobility    Modified Rankin (Stroke Patients Only) Modified Rankin (Stroke Patients Only) Pre-Morbid Rankin Score: No symptoms Modified Rankin: Moderately severe disability     Balance Overall balance assessment: Needs assistance       Postural control: Left lateral lean Standing balance support: Bilateral upper extremity supported;During functional activity Standing balance-Leahy Scale: Poor                               Pertinent Vitals/Pain Pain Assessment: No/denies pain    Home Living Family/patient expects to be discharged to:: Private residence Living Arrangements: Alone Available Help at Discharge: Family Type of Home: House Home Access: Level entry     Home Layout: Multi-level;Bed/bath upstairs;1/2 bath on main level Home Equipment: None      Prior Function Level of Independence: Independent               Hand Dominance   Dominant Hand: Right    Extremity/Trunk Assessment   Upper Extremity Assessment Upper Extremity Assessment: Defer to  OT evaluation    Lower Extremity Assessment Lower Extremity Assessment: RLE deficits/detail;LLE deficits/detail RLE Deficits / Details: 5/5 all myotomes LLE Deficits / Details: 3/5 knee flexion, hip flexion and quads    Cervical / Trunk Assessment Cervical / Trunk Assessment: Normal  Communication   Communication: No difficulties  Cognition Arousal/Alertness: Awake/alert Behavior During Therapy: WFL  for tasks assessed/performed Overall Cognitive Status: Impaired/Different from baseline Area of Impairment: Problem solving;Awareness;Safety/judgement                   Current Attention Level: Selective Memory: Decreased recall of precautions Following Commands: Follows one step commands with increased time Safety/Judgement: Decreased awareness of safety;Decreased awareness of deficits Awareness: Emergent Problem Solving: Requires verbal cues;Requires tactile cues General Comments: pt without impulsiveness this session, inattention to left with cues to attend to environment and LUE      General Comments      Exercises     Assessment/Plan    PT Assessment Patient needs continued PT services  PT Problem List Decreased strength;Decreased balance;Decreased cognition;Decreased activity tolerance;Decreased coordination;Decreased knowledge of use of DME;Decreased safety awareness       PT Treatment Interventions Gait training;Therapeutic activities;Neuromuscular re-education;Therapeutic exercise;Stair training;DME instruction;Functional mobility training;Balance training;Patient/family education    PT Goals (Current goals can be found in the Care Plan section)  Acute Rehab PT Goals Patient Stated Goal: to be able to return home PT Goal Formulation: With patient/family Time For Goal Achievement: 12/13/18 Potential to Achieve Goals: Fair    Frequency Min 4X/week   Barriers to discharge Decreased caregiver support      Co-evaluation               AM-PAC PT "6 Clicks" Mobility  Outcome Measure Help needed turning from your back to your side while in a flat bed without using bedrails?: A Little Help needed moving from lying on your back to sitting on the side of a flat bed without using bedrails?: A Lot Help needed moving to and from a bed to a chair (including a wheelchair)?: A Lot Help needed standing up from a chair using your arms (e.g., wheelchair or bedside  chair)?: A Lot Help needed to walk in hospital room?: A Lot Help needed climbing 3-5 steps with a railing? : Total 6 Click Score: 12    End of Session Equipment Utilized During Treatment: Gait belt Activity Tolerance: Patient tolerated treatment well Patient left: in chair;with call bell/phone within reach;with family/visitor present;with nursing/sitter in room Nurse Communication: Mobility status;Precautions PT Visit Diagnosis: Other abnormalities of gait and mobility (R26.89);Muscle weakness (generalized) (M62.81);Unsteadiness on feet (R26.81);Other symptoms and signs involving the nervous system (H74.163)    Time: 8453-6468 PT Time Calculation (min) (ACUTE ONLY): 20 min   Charges:   PT Evaluation $PT Eval Moderate Complexity: 1 Mod          [place], PT Acute Rehabilitation Services Pager: [phone] Office: [phone]   Gracielynn Birkel B Jing Howatt 11/29/2018, 12:02 PM

## 2018-11-29 NOTE — Progress Notes (Signed)
STROKE TEAM PROGRESS NOTE   INTERVAL HISTORY No changes.  His 2 daughters are at bedside.He is sitting up in the bed, awake, alert.  Blood pressure adequately controlled. He has been seen by therapist who recommend rehabilitation  Vitals:   11/29/18 1100 11/29/18 1115 11/29/18 1130 11/29/18 1200  BP: 134/76  (!) 140/93 (!) 142/93  Pulse: 63 65 70 70  Resp: (!) 23 20 (!) 22 (!) 21  Temp:    98.2 F (36.8 C)  TempSrc:    Oral  SpO2: 96% 96% 97% 96%  Weight:      Height:        CBC:  Recent Labs  Lab 11/27/18 1905 11/27/18 1914  WBC 3.9*  --   NEUTROABS 1.7  --   HGB 14.1 15.0  HCT 43.6 44.0  MCV 95.8  --   PLT PLATELET CLUMPS NOTED ON SMEAR, COUNT APPEARS DECREASED  --     Basic Metabolic Panel:  Recent Labs  Lab 11/27/18 1905 11/27/18 1914  11/29/18 0430 11/29/18 0814  NA 131* 137   < > 136 135  K 4.0 3.6  --   --   --   CL 98 100  --   --   --   CO2 24  --   --   --   --   GLUCOSE 90 87  --   --   --   BUN 14 16  --   --   --   CREATININE 1.33* 1.40*  --   --   --   CALCIUM 8.5*  --   --   --   --    < > = values in this interval not displayed.   Lipid Panel:     Component Value Date/Time   CHOL 137 11/29/2018 0430   TRIG 94 11/29/2018 0430   TRIG 94 11/29/2018 0430   TRIG 150 (H) 09/28/2006 0957   HDL 42 11/29/2018 0430   CHOLHDL 3.3 11/29/2018 0430   VLDL 19 11/29/2018 0430   LDLCALC 76 11/29/2018 0430   HgbA1c: No results found for: HGBA1C Urine Drug Screen: No results found for: LABOPIA, COCAINSCRNUR, LABBENZ, AMPHETMU, THCU, LABBARB  Alcohol Level No results found for: ETH  IMAGING Ct Head Wo Contrast  Result Date: 11/28/2018 CLINICAL DATA:  Follow-up examination for intracerebral hemorrhage. EXAM: CT HEAD WITHOUT CONTRAST TECHNIQUE: Contiguous axial images were obtained from the base of the skull through the vertex without intravenous contrast. COMPARISON:  Prior CT from 11/27/2018. FINDINGS: Brain: Acute intraparenchymal hemorrhage centered at  the right lentiform nucleus stable in size measuring 4.4 x 2.5 x 3.7 cm. Surrounding low-density vasogenic edema mildly increased similar regional mass effect with partial effacement of the right lateral ventricle and 2 mm right-to-left shift. No hydrocephalus or ventricular trapping. Basilar cisterns remain patent. No intraventricular extension or other complication identified. Few additional tiny foci of subarachnoid blood noted within the left cerebral hemisphere (series 3, image 20, 26), grossly similar to previous. No other new acute intracranial hemorrhage. No acute large vessel territory infarct. Underlying atrophy with chronic microvascular ischemic disease again noted. No extra-axial fluid collection. Vascular: No hyperdense vessel. Calcified atherosclerosis at the skull base. Skull: Scalp soft tissues and calvarium within normal limits. Sinuses/Orbits: Globes and orbital soft tissues within normal limits. Paranasal sinuses and mastoid air cells are clear. Other: None. IMPRESSION: 1. No significant interval change in size and appearance of intraparenchymal hemorrhage centered at the right basal ganglia, with similar localized edema  and regional mass effect. Associated 2 mm right-to-left midline shift unchanged. 2. Otherwise stable appearance of the brain with no other new acute intracranial abnormality identified. Electronically Signed   By: Jeannine Boga M.D.   On: 11/28/2018 01:34   Ct Head Wo Contrast  Result Date: 11/27/2018 CLINICAL DATA:  73 y/o M; condition deteriorating. Intracranial hemorrhage. EXAM: CT HEAD WITHOUT CONTRAST TECHNIQUE: Contiguous axial images were obtained from the base of the skull through the vertex without intravenous contrast. COMPARISON:  11/27/2017 CT head. FINDINGS: Brain: Hemorrhage centered within the right lentiform nucleus measures 4.4 x 2.5 x 3.7 cm (volume = 21 cm^3)(AP x ML x CC series 3, image 19 and series 6, image 24). Surrounding edema and associated  mass effect is also mildly increased with 2 mm right-to-left midline shift increased effacement of the right lateral ventricle. No intraventricular subarachnoid hemorrhage identified. 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 Stable chronic microvascular ischemic changes and volume loss of the brain. Stable small density in the left frontal white matter corresponding to cavernoma on prior MRI. Vascular: Calcific atherosclerosis of carotid siphons. No hyperdense vessel. Skull: Normal. Negative for fracture or focal lesion. Sinuses/Orbits: No acute finding. Other: None. IMPRESSION: 1. Mild increase in size of hemorrhage within the right lentiform nucleus measuring up to 4.4 cm, 21 cc. Previously 3.9 cm and 17 cc. 2. Mildly increased associated mass effect with increased effacement of right lateral ventricle and 2 mm right-to-left midline shift. These results were called by telephone at the time of interpretation on 11/27/2018 at 8:35 pm to Dr. Kerney Elbe , who verbally acknowledged these results. Electronically Signed   By: Kristine Garbe M.D.   On: 11/27/2018 20:39   Mr Jodene Nam Head Wo Contrast  Result Date: 11/28/2018 CLINICAL DATA:  Follow-up intraparenchymal hemorrhage in the right basal ganglia. EXAM: MRI HEAD WITHOUT CONTRAST MRA HEAD WITHOUT CONTRAST TECHNIQUE: Multiplanar, multiecho pulse sequences of the brain and surrounding structures were obtained without intravenous contrast. Angiographic images of the head were obtained using MRA technique without contrast. COMPARISON:  CT same day FINDINGS: MRI HEAD FINDINGS Brain: Diffusion imaging shows a 4 mm acute infarction in the left medial frontal subcortical white matter. Right basal ganglia hematoma measuring approximately 3.7 x 4.4 x 2.5 cm as seen on the previous CT. Surrounding edema. No evidence of an underlying mass lesion or high flow vascular malformation. Old hemosiderin deposition related to previous hemorrhage in the  left basal ganglia/external capsule, left frontal white matter, the right lateral thalamus and the right posteromedial temporal lobe. Moderate chronic small-vessel ischemic changes affect the cerebral hemispheric white matter. No hydrocephalus or extra-axial collection. Right-to-left shift of 2-3 mm is unchanged. Vascular: Major vessels at the base of the brain show flow. Skull and upper cervical spine: Negative Sinuses/Orbits: Clear/normal Other: None MRA HEAD FINDINGS Both internal carotid arteries are widely patent through the skull base and siphon regions. The anterior and middle cerebral vessels are patent without proximal stenosis, aneurysm or vascular malformation. Posterior cerebral arteries receive most of there supply from the anterior circulation. Both vertebral arteries are patent to the basilar. No basilar stenosis. Posterior circulation branch vessels are patent. IMPRESSION: MRI head: Right basal ganglia hematoma as seen previously without apparent enlargement. Surrounding edema. Mild mass effect with right-to-left shift of 2-3 mm. No sign of underlying tumor or high flow vascular malformation. Chronic small-vessel ischemic changes elsewhere within the brain. Other foci of hemosiderin deposition consistent with old prior hemorrhage. The tendency to hemorrhage within  the brain suggest the possibility of amyloid angiopathy. 4 mm acute infarction in the medial left subcortical white matter. Moderate chronic small-vessel ischemic changes elsewhere throughout the brain. Electronically Signed   By: Nelson Chimes M.D.   On: 11/28/2018 14:26   Mr Brain Wo Contrast  Result Date: 11/28/2018 CLINICAL DATA:  Follow-up intraparenchymal hemorrhage in the right basal ganglia. EXAM: MRI HEAD WITHOUT CONTRAST MRA HEAD WITHOUT CONTRAST TECHNIQUE: Multiplanar, multiecho pulse sequences of the brain and surrounding structures were obtained without intravenous contrast. Angiographic images of the head were obtained using  MRA technique without contrast. COMPARISON:  CT same day FINDINGS: MRI HEAD FINDINGS Brain: Diffusion imaging shows a 4 mm acute infarction in the left medial frontal subcortical white matter. Right basal ganglia hematoma measuring approximately 3.7 x 4.4 x 2.5 cm as seen on the previous CT. Surrounding edema. No evidence of an underlying mass lesion or high flow vascular malformation. Old hemosiderin deposition related to previous hemorrhage in the left basal ganglia/external capsule, left frontal white matter, the right lateral thalamus and the right posteromedial temporal lobe. Moderate chronic small-vessel ischemic changes affect the cerebral hemispheric white matter. No hydrocephalus or extra-axial collection. Right-to-left shift of 2-3 mm is unchanged. Vascular: Major vessels at the base of the brain show flow. Skull and upper cervical spine: Negative Sinuses/Orbits: Clear/normal Other: None MRA HEAD FINDINGS Both internal carotid arteries are widely patent through the skull base and siphon regions. The anterior and middle cerebral vessels are patent without proximal stenosis, aneurysm or vascular malformation. Posterior cerebral arteries receive most of there supply from the anterior circulation. Both vertebral arteries are patent to the basilar. No basilar stenosis. Posterior circulation branch vessels are patent. IMPRESSION: MRI head: Right basal ganglia hematoma as seen previously without apparent enlargement. Surrounding edema. Mild mass effect with right-to-left shift of 2-3 mm. No sign of underlying tumor or high flow vascular malformation. Chronic small-vessel ischemic changes elsewhere within the brain. Other foci of hemosiderin deposition consistent with old prior hemorrhage. The tendency to hemorrhage within the brain suggest the possibility of amyloid angiopathy. 4 mm acute infarction in the medial left subcortical white matter. Moderate chronic small-vessel ischemic changes elsewhere throughout  the brain. Electronically Signed   By: Nelson Chimes M.D.   On: 11/28/2018 14:26   Dg Swallowing Func-speech Pathology  Result Date: 11/28/2018 Objective Swallowing Evaluation: Type of Study: MBS-Modified Barium Swallow Study  Patient Details Name: Barry Woods MRN: [id number] Date of Birth: 1945/12/07 Today's Date: 11/28/2018 Time: SLP Start Time (ACUTE ONLY): 1400 -SLP Stop Time (ACUTE ONLY): 1430 SLP Time Calculation (min) (ACUTE ONLY): 30 min Past Medical History: Past Medical History: Diagnosis Date . Arthritis  . Cerebral cavernoma  . Hyperlipidemia  . Hypertension  . Rheumatoid arthritis [facility])  Past Surgical History: Past Surgical History: Procedure Laterality Date . COLONOSCOPY   . LIPOMA EXCISION   . PALATE / UVULA BIOPSY / EXCISION   . TONSILLECTOMY    age 85 HPI: The patient is a 73 year old white male who was admitted yesterday with a right basal ganglia hemorrhage.  No data recorded Assessment / Plan / Recommendation CHL IP CLINICAL IMPRESSIONS 11/28/2018 Clinical Impression Pt demonstrates mild pharyngeal weakness with decreased effort and duration of laryngeal elevation and hyoid excursion. This leads to mild vallecular residue and slightly decreased laryngeal closure that at times leads to trace penetration and aspiraiton events during and after the swallow. The pt senses this penetrate with a throat clear, though needs cues to apply force to  fully eject. A chin tuck was beneficial in decreased residue and improving laryngeal protection. recommend the pt initiate a mechanical soft diet and thin liquids with a chin tuck and intermittent throat clearing to eject aspirate if present. Would benefit from EMST to target stegth and respiratory drive. Will follow for tolerance.  SLP Visit Diagnosis Dysphagia, oropharyngeal phase (R13.12) Attention and concentration deficit following -- Frontal lobe and executive function deficit following -- Impact on safety and function Moderate aspiration risk   CHL IP  TREATMENT RECOMMENDATION 11/28/2018 Treatment Recommendations Therapy as outlined in treatment plan below   Prognosis 11/28/2018 Prognosis for Safe Diet Advancement Good Barriers to Reach Goals -- Barriers/Prognosis Comment -- CHL IP DIET RECOMMENDATION 11/28/2018 SLP Diet Recommendations Dysphagia 3 (Mech soft) solids;Thin liquid Liquid Administration via Cup;Straw Medication Administration Whole meds with liquid Compensations Slow rate;Small sips/bites;Chin tuck;Clear throat intermittently Postural Changes Remain semi-upright after after feeds/meals (Comment)   No flowsheet data found.  CHL IP FOLLOW UP RECOMMENDATIONS 11/28/2018 Follow up Recommendations Inpatient Rehab   CHL IP FREQUENCY AND DURATION 11/28/2018 Speech Therapy Frequency (ACUTE ONLY) min 2x/week Treatment Duration 2 weeks      CHL IP ORAL PHASE 11/28/2018 Oral Phase Impaired Oral - Pudding Teaspoon -- Oral - Pudding Cup -- Oral - Honey Teaspoon -- Oral - Honey Cup -- Oral - Nectar Teaspoon -- Oral - Nectar Cup -- Oral - Nectar Straw -- Oral - Thin Teaspoon -- Oral - Thin Cup -- Oral - Thin Straw -- Oral - Puree -- Oral - Mech Soft -- Oral - Regular -- Oral - Multi-Consistency -- Oral - Pill -- Oral Phase - Comment slightly increased effort to masticate solids  CHL IP PHARYNGEAL PHASE 11/28/2018 Pharyngeal Phase Impaired Pharyngeal- Pudding Teaspoon -- Pharyngeal -- Pharyngeal- Pudding Cup -- Pharyngeal -- Pharyngeal- Honey Teaspoon -- Pharyngeal -- Pharyngeal- Honey Cup -- Pharyngeal -- Pharyngeal- Nectar Teaspoon Reduced epiglottic inversion;Reduced anterior laryngeal mobility;Pharyngeal residue - valleculae;Reduced tongue base retraction Pharyngeal -- Pharyngeal- Nectar Cup -- Pharyngeal -- Pharyngeal- Nectar Straw Reduced epiglottic inversion;Reduced anterior laryngeal mobility;Pharyngeal residue - valleculae;Reduced tongue base retraction Pharyngeal -- Pharyngeal- Thin Teaspoon -- Pharyngeal -- Pharyngeal- Thin Cup Reduced epiglottic inversion;Reduced  anterior laryngeal mobility;Pharyngeal residue - valleculae;Reduced tongue base retraction;Penetration/Apiration after swallow;Pharyngeal residue - pyriform Pharyngeal Material enters airway, passes BELOW cords then ejected out;Material does not enter airway Pharyngeal- Thin Straw Reduced epiglottic inversion;Reduced anterior laryngeal mobility;Pharyngeal residue - valleculae;Reduced tongue base retraction;Penetration/Aspiration during swallow;Trace aspiration Pharyngeal Material enters airway, remains ABOVE vocal cords and not ejected out;Material enters airway, CONTACTS cords and then ejected out;Material does not enter airway Pharyngeal- Puree Reduced epiglottic inversion;Reduced anterior laryngeal mobility;Reduced tongue base retraction;Pharyngeal residue - valleculae Pharyngeal -- Pharyngeal- Mechanical Soft -- Pharyngeal -- Pharyngeal- Regular Reduced epiglottic inversion;Reduced anterior laryngeal mobility;Reduced tongue base retraction;Pharyngeal residue - valleculae Pharyngeal -- Pharyngeal- Multi-consistency -- Pharyngeal -- Pharyngeal- Pill -- Pharyngeal -- Pharyngeal Comment --  No flowsheet data found. Barry Woods, Barry [facility] Pager [phone] Office [phone] Barry Woods 11/28/2018, 3:01 PM              Ct Head Code Stroke Wo Contrast  Result Date: 11/27/2018 CLINICAL DATA:  Code stroke. 73 y/o M; left-sided weakness and facial droop. EXAM: CT HEAD WITHOUT CONTRAST TECHNIQUE: Contiguous axial images were obtained from the base of the skull through the vertex without intravenous contrast. COMPARISON:  None. FINDINGS: Brain: Acute hemorrhage centered within the right lentiform nucleus measuring 3.9 x 2.3 x 3.7 cm (volume = 17 cm^3), series  3, image 17 and series 6, image 20. Mild surrounding edema. Associated local mass effect partially effaces the right lateral ventricle. No significant midline shift. No downward herniation. No intraventricular  hemorrhage or subarachnoid hemorrhage. Stable background of chronic microvascular ischemic changes and volume loss of the brain. Small density within the left frontal lobe corresponds to cavernoma on prior MRI of the head. Vascular: Calcific atherosclerosis of carotid siphons. No hyperdense vessel identified. Skull: Normal. Negative for fracture or focal lesion. Sinuses/Orbits: No acute finding. Other: None. ASPECTS [facility] Stroke Program Early CT Score) - Ganglionic level infarction (caudate, lentiform nuclei, internal capsule, insula, M1-M3 cortex): 7 - Supraganglionic infarction (M4-M6 cortex): 3 Total score (0-10 with 10 being normal): 10 IMPRESSION: 1. Acute hemorrhage within the right lentiform nucleus measuring up to 3.9 cm, 17 cc. 2. Associated edema and mass effect results in partial effacement of the right lateral ventricle. No midline shift or herniation. 3. ASPECTS is not applicable. 4. Stable background of chronic microvascular ischemic changes and volume loss of the brain. Left frontal cavernoma again noted. Critical Value/emergent results were called by telephone at the time of interpretation on 11/27/2018 at 7:06 pm to Dr. Dr. Rory Woods, who verbally acknowledged these results. Electronically Signed   By: Kristine Garbe M.D.   On: 11/27/2018 19:11    PHYSICAL EXAM Pleasant elderly Caucasian male not in distress. . Afebrile. Head is nontraumatic. Neck is supple without bruit.    Cardiac exam no murmur or gallop. Lungs are clear to auscultation. Distal pulses are well felt. Neurological Exam :  Awake alert oriented 3 with normal speech and language function. No aphasia or dysarthria. Extraocular movements are full range without nystagmus. Blinks to threat bilaterally. Fundi not visualized. Mild left lower facial weakness. Tongue midline. Motor system exam shows mild left hemiparesis 4/5 with weakness of left grip and intrin Orbits right over left upper extremity. Mild weakness of left hip  flexors ankle dorsiflexors only. Sensation appears preserved bilaterally. Plantars are downgoing.  Gait not tested.  ASSESSMENT/PLAN Barry Woods is a 73 y.o. male with history of AF on eliquis, RA, HTN, HLD, cerebral cavernomas presenting with L sided weakness.   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  Small vessel disease.  Left frontal cavernoma  CT head 1/5 2033 mild increase in size of hemorrhage up to 21 cc, previously 17 cc.  Mildly increased mass-effect and effacement of right lateral ventricle with 2 mm right to left shift.  CT head 1/6 0101 no significant change in size of hemorrhage or midline shift.  MRI / MRA head R basal ganglia hemorrhage with surrounding edema.  2 to 3 mm right to left shift.  No underlying tumor or high flow vascular malformation.  Chronic small vessel disease.  Foci of hemosiderin consistent with old prior hemorrhage.  Suggest amyloid angiopathy.  4 mm infarct medial left subcortical white matter.  LDL pending   SCDs for VTE prophylaxis  Eliquis (apixaban) daily prior to admission, now on No antithrombotic given ICH  Therapy recommendations:  pending . Ok to be OOB  Disposition:  pending (retired, lives alone in a 3-story town home)  Keep in ICU over night if possible  Cerebral Edema  Induced hypernatremia  Pt awake and alert  On 3% via PIV  Na 138  Continue x 24h then d/c  Atrial Fibrillation  Home anticoagulation:  Eliquis (apixaban) daily   Eliquis reversed w/ Andexxa  Planned cardioversion next week - [place]   No longer a long-term AC candidate  No need for IP cardiology consult at this  time  Hypertension  BP as high as 149/86  Now on Cleviprex  SBP goal < 140 x 24h  Home meds:  Atenolol 75 mg daily   Resume home meds . Long-term BP goal normotensive  Hyperlipidemia  Home meds:  zocor 20  Resume in hospital once able to swallow  LDL pending   Hold statin at this time  Other Stroke Risk Factors  Advanced age  ETOH use, advised to drink no more than 2 drink(s) a day  Cerebral Cavernomas, followed by Dr. Kathyrn Sheriff  Other Active Problems  RA on scheduled injections Humira  CKD,  Cr 1.4  Essential tremor, on atenolol  On lasix and potassium at home. Will resume K now, consider lasix in am  Hospital day # 2     . he presented with left hemiparesis due to right basal ganglia hemorrhage likely related to anticoagulation with eliquis. continuestrict blood pressure control and close neurological monitoring. Hypertonic saline has been discontinued. Follow-up CT scan shows stable appearance of the hematoma without any increase mass effect or midline shift.Starleen Blue out of bed. Transfer to neurology floor bed.. May consider adding aspirin in a few days. Long discussion with patient and daughter at the bedside and answered questions.This patient is critically ill and at significant risk of neurological worsening, death and care requires constant monitoring of vital signs, hemodynamics,respiratory and cardiac monitoring, extensive review of multiple databases, frequent neurological assessment, discussion with family, other specialists and medical decision making of high complexity.I have made any additions or clarifications directly to the above note.This critical care time does not reflect procedure time, or teaching time or supervisory time of PA/NP/Med Resident etc but could involve care discussion time.  I spent 30 minutes of neurocritical care time  in the care of  this patient.      Antony Contras, MD Medical Director [facility] Pager:  [phone] 11/29/2018 2:26 PM  To contact Stroke Continuity provider, please refer to [email or web address] After hours, contact General Neurology

## 2018-11-29 NOTE — Telephone Encounter (Signed)
The patient's sister called to report that the patient is in the hospital for stroke and would like for Dr. Angelena Form to come by and see him. Informed her that Dr. Angelena Form is doing procedures today, but will be notified of Mr. Rabine's hospitalization.  She also states he had an appointment scheduled with Dr. Angelena Form this Thursday but cancelled due to hospitalization. She requests a call from Dr. Camillia Herter nurse to reschedule once discharged.  She was grateful for assistance.

## 2018-11-29 NOTE — Telephone Encounter (Signed)
Pt sister is calling to report pt is in hospital and has had a stroke.  He is at [facility], she is wondering if Dr Angelena Form will see him in the hospital. Please call

## 2018-11-29 NOTE — Telephone Encounter (Signed)
I went by and spoke to the patient and his family today.   Lauree Chandler

## 2018-11-29 NOTE — Progress Notes (Signed)
Inpatient Rehabilitation-Admissions Coordinator    Met with patient, his daughter, and his sister at the bedside to discuss team's recommendation for inpatient rehabilitation. Shared booklets, expectations while in CIR, expected length of stay, and anticipated functional level at DC. Pt will most likely require physical assist during the day after CIR; family is unsure if they have the caregiver support recommended but is currently looking into options. AC will follow up with family tomorrow. AC has initiated insurance authorization for possible admit.   Family is aware that if caregiver support is not available, the pt will need a different post acute care option.   Jhonnie Garner, OTR/L  Rehab Admissions Coordinator  [phone] 11/29/2018 5:57 PM

## 2018-11-30 LAB — BASIC METABOLIC PANEL
Anion gap: 6 (ref 5–15)
BUN: 14 mg/dL (ref 8–23)
CO2: 21 mmol/L — ABNORMAL LOW (ref 22–32)
Calcium: 8.6 mg/dL — ABNORMAL LOW (ref 8.9–10.3)
Chloride: 106 mmol/L (ref 98–111)
Creatinine, Ser: 1.11 mg/dL (ref 0.61–1.24)
GFR calc Af Amer: >60 mL/min (ref >60)
GFR calc non Af Amer: >60 mL/min (ref >60)
GLUCOSE: 88 mg/dL (ref 70–99)
Potassium: 3.8 mmol/L (ref 3.5–5.1)
Sodium: 133 mmol/L — ABNORMAL LOW (ref 135–145)

## 2018-11-30 NOTE — Progress Notes (Addendum)
Occupational Therapy Treatment Patient Details Name: Barry Woods MRN: [id number] DOB: 31-Jan-1946 Today's Date: 11/30/2018    History of present illness 73 yo admitted with left weakness and right basal ganglia hemorrhage. Pmhx: Afib, HTN, HLD, RA   OT comments  Patient supine in bed and agreeable to OT/PT session.  Patient continues to be limited by L inattention, weakness, impaired balance/L lateral lean with poor midline awareness, and poor coordination. Completes sit to stand with min assist +2, but requires mod to max assist +2 for sustained standing balance during grooming tasks and functional mobility.  Patient able to complete AROM exercises with L UE and initates ADLs with L UE , but limited by attention and thoroughness.  DC plan remains appropriate.  Will follow.    Follow Up Recommendations  CIR    Equipment Recommendations  Other (comment)(TBd at next venue of care)    Recommendations for Other Services Rehab consult    Precautions / Restrictions Precautions Precautions: Fall Precaution Comments: left inattention, left hemiparesis Restrictions Weight Bearing Restrictions: No       Mobility Bed Mobility Overal bed mobility: Needs Assistance Bed Mobility: Supine to Sit     Supine to sit: Mod assist;+2 for physical assistance     General bed mobility comments: Pt required assistance to roll and push with RLE and reach for rail with RUE to achieve sidleying at edge of bed.  He presents with L lateral lean/ with anterior lean as well.  Pt able to follow commands to scoot forward and correct posture.      Transfers Overall transfer level: Needs assistance Equipment used: Rolling walker (2 wheeled)(requires hand over hand for L UE support grasp) Transfers: Sit to/from Stand Sit to Stand: Min assist;+2 physical assistance(once in standing requires mod to max +2 to maintain standing)         General transfer comment: increased time and effort, cueing for hand  placement and sequencing, L lateral lean and L inattention     Balance Overall balance assessment: Needs assistance Sitting-balance support: Feet supported;Bilateral upper extremity supported Sitting balance-Leahy Scale: Poor Sitting balance - Comments: anterior and L lateral lean with verbal cueing to correct with unsupported sitting  Postural control: Left lateral lean Standing balance support: During functional activity;Single extremity supported Standing balance-Leahy Scale: Poor Standing balance comment: L lateral lean with reliance on UE support and poor midline awareness, constant cueing for weight shifting to R.                            ADL either performed or assessed with clinical judgement   ADL Overall ADL's : Needs assistance/impaired     Grooming: Wash/dry face;Oral care;Brushing hair;Minimal assistance;Standing Grooming Details (indicate cue type and reason): requires +2 to complete standing at sink, requires HOH assist to reach with L hand, assist to open toothpaste and cueing throughout for thoroughness                  Toilet Transfer: Moderate assistance;+2 for physical assistance;Ambulation;RW Toilet Transfer Details (indicate cue type and reason): simulated to recliner          Functional mobility during ADLs: Moderate assistance;Maximal assistance;+2 for physical assistance;Rolling walker;Cueing for sequencing;Cueing for safety General ADL Comments: patient with poor thoroughness of ADL tasks, L lateral lean     Vision   Additional Comments: further assessment needed, able to locate items on sink with increased time with cueing for visual scanning towards  L side   Perception     Praxis      Cognition Arousal/Alertness: Awake/alert Behavior During Therapy: WFL for tasks assessed/performed Overall Cognitive Status: Impaired/Different from baseline Area of Impairment: Problem solving;Awareness;Safety/judgement;Attention                    Current Attention Level: Sustained Memory: Decreased recall of precautions Following Commands: Follows one step commands with increased time Safety/Judgement: Decreased awareness of safety;Decreased awareness of deficits Awareness: Emergent Problem Solving: Requires verbal cues;Requires tactile cues;Difficulty sequencing General Comments: Pt presents with decreased attention and thoroughness with tasks, impulsivity increases with fatigue.         Exercises Exercises: General Upper Extremity General Exercises - Upper Extremity Shoulder Flexion: AAROM;Left;5 reps;Seated Shoulder Extension: AAROM;Left;5 reps;Seated Elbow Flexion: AROM;Left;5 reps;Seated Elbow Extension: AROM;Left;5 reps;Seated Digit Composite Flexion: AROM;Left;5 reps;Seated Composite Extension: AROM;Left;5 reps;Seated   Shoulder Instructions       General Comments sister present and supportive; noted edema L hand, reviewed elevation and UE protection/positioning    Pertinent Vitals/ Pain       Pain Assessment: No/denies pain  Home Living                                          Prior Functioning/Environment              Frequency  Min 2X/week        Progress Toward Goals  OT Goals(current goals can now be found in the care plan section)  Progress towards OT goals: Progressing toward goals  Acute Rehab OT Goals Patient Stated Goal: to be able to return home OT Goal Formulation: With patient Time For Goal Achievement: 12/12/18 Potential to Achieve Goals: Good  Plan Discharge plan remains appropriate;Frequency remains appropriate    Co-evaluation    PT/OT/SLP Co-Evaluation/Treatment: Yes Reason for Co-Treatment: For patient/therapist safety;To address functional/ADL transfers   OT goals addressed during session: ADL's and self-care;Strengthening/ROM      AM-PAC OT "6 Clicks" Daily Activity     Outcome Measure   Help from another person eating meals?: A  Little Help from another person taking care of personal grooming?: A Little Help from another person toileting, which includes using toliet, bedpan, or urinal?: Total Help from another person bathing (including washing, rinsing, drying)?: A Lot Help from another person to put on and taking off regular upper body clothing?: A Lot Help from another person to put on and taking off regular lower body clothing?: Total 6 Click Score: 12    End of Session Equipment Utilized During Treatment: Gait belt;Rolling walker  OT Visit Diagnosis: Other abnormalities of gait and mobility (R26.89);Hemiplegia and hemiparesis;Other symptoms and signs involving cognitive function Hemiplegia - Right/Left: Left Hemiplegia - dominant/non-dominant: Non-Dominant Hemiplegia - caused by: Nontraumatic intracerebral hemorrhage   Activity Tolerance Patient tolerated treatment well   Patient Left in chair;with call bell/phone within reach;with chair alarm set;with family/visitor present   Nurse Communication Mobility status;Precautions        Time: 2505-3976 OT Time Calculation (min): 32 min  Charges: OT General Charges $OT Visit: 1 Visit OT Treatments $Self Care/Home Management : 8-22 mins  Delight Stare, OT Acute Rehabilitation Services Pager [phone] Office [phone]    Delight Stare 11/30/2018, 4:43 PM

## 2018-11-30 NOTE — Progress Notes (Signed)
STROKE TEAM PROGRESS NOTE   INTERVAL HISTORY His wife is at the bedside. Dr. Angelena Form visited yesterday and discussed possible future plans for AF tx w/ pt and family. Multiple questions addressed by Dr. Leonie Man.    Vitals:   11/29/18 2323 11/30/18 0412 11/30/18 0753 11/30/18 1014  BP: (!) 147/100 (!) 152/104 (!) 154/100 (!) 148/102  Pulse: 60 69 69 66  Resp: 16 17    Temp: 98.2 F (36.8 C) 98.9 F (37.2 C) 97.8 F (36.6 C)   TempSrc:  Oral Oral   SpO2: 97% 95% 93%   Weight:      Height:       Lipid Panel:     Component Value Date/Time   CHOL 137 11/29/2018 0430   TRIG 94 11/29/2018 0430   TRIG 94 11/29/2018 0430   TRIG 150 (H) 09/28/2006 0957   HDL 42 11/29/2018 0430   CHOLHDL 3.3 11/29/2018 0430   VLDL 19 11/29/2018 0430   LDLCALC 76 11/29/2018 0430   IMAGING No results found.   PHYSICAL EXAM per Dr. Leonie Man Pleasant elderly Caucasian male not in distress. . Afebrile. Head is nontraumatic. Neck is supple without bruit.  Cardiac exam no murmur or gallop. Lungs are clear to auscultation. Distal pulses are well felt. Neurological Exam :  Awake alert oriented 3 with normal speech and language function. No aphasia or dysarthria. Extraocular movements are full range without nystagmus. Blinks to threat bilaterally but suspect partial left hemianopsia. Fundi not visualized. Mild left lower facial weakness. Tongue midline. Motor system exam shows mild left hemiparesis 4/5 with weakness of left grip and intrin Orbits right over left upper extremity. Mild weakness of left hip flexors ankle dorsiflexors only. Sensation appears preserved bilaterally. Plantars are downgoing.  Gait not tested.  ASSESSMENT/PLAN [name] is a 73 y.o. male with history of AF on eliquis, RA, HTN, HLD, cerebral cavernomas presenting with L sided weakness.   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  Small vessel disease.  Left frontal cavernoma  CT head 1/5 2033 mild increase in size of hemorrhage up to 21 cc, previously 17 cc.  Mildly increased mass-effect and effacement of right lateral ventricle with 2 mm right to left shift.  CT head 1/6 0101 no significant change in size of hemorrhage or midline shift.  MRI / MRA head R basal ganglia hemorrhage with surrounding edema.  2 to 3 mm right to left shift.  No underlying tumor or high flow vascular malformation.  Chronic small vessel disease.  Foci of hemosiderin consistent with old prior hemorrhage.  Suggest amyloid angiopathy.  4 mm infarct medial left subcortical white matter.  LDL 76  Mg%  SCDs for VTE prophylaxis  Eliquis (apixaban) daily prior to admission, now on No antithrombotic given ICH  Therapy recommendations:  CIR. Admissions coordinator following for home care plans  Disposition:  pending (retired, lives alone in a 3-story town home)  Cerebral Edema, resolved Induced hypernatremia  Pt awake and alert  Treated w/ 3% on ICU via PIV  Na 133 today   Atrial Fibrillation  Home anticoagulation:  Eliquis (apixaban) daily   Eliquis reversed w/ Andexxa  Planned cardioversion next week - [place] -  on hold  No longer a long-term AC candidate  Consider low dose aspirin 1/9  Hypertension  BP as high as 149/86  Now on Cleviprex  SBP goal < 140 x 24h  Home meds:  Atenolol 75 mg daily   Resume home meds . Long-term BP goal normotensive  Hyperlipidemia  Home meds:  zocor 20  Resume in hospital once able to swallow  LDL pending   Hold statin at this time  Other Stroke Risk Factors  Advanced age  ETOH use, advised to drink no more than 2 drink(s) a day  Cerebral Cavernomas,  followed by Dr. Kathyrn Sheriff  Other Active Problems  RA on scheduled injections Humira, plan administration here on Fri    CKD,  Cr 1.4  Essential tremor, on atenolol  On lasix and potassium at home. Will resume K now, consider lasix in am  Hospital day # 3     .  Marland Kitchen Mobilize out of bed. Transfer to rehab bed. After insurance approval. May consider adding aspirin tomorrow. Long discussion with patient and wife at the bedside and answered questions.greater than 50% time during this 25 minute visit was spent on counseling and coordination of care about his hemorrhage and discussion about atrial fibrillation and stroke prevention and treatment     Antony Contras, MD Medical Director [facility] Pager: [phone] 11/30/2018 10:59 AM  To contact Stroke Continuity provider, please refer to [email or web address] After hours, contact General Neurology

## 2018-11-30 NOTE — Progress Notes (Signed)
Physical Therapy Treatment Patient Details Name: Barry Woods MRN: [id number] DOB: 06-15-1946 Today's Date: 11/30/2018    History of Present Illness 73 yo admitted with left weakness and right basal ganglia hemorrhage. Pmhx: Afib, HTN, HLD, RA    PT Comments    Pt performed gait training and functional mobility during session with emphasis on balance training as he performed ADLs with OT.  Pt continues to present with L lateral lean during standing at sink counter and required constant cueing to correct and more frequently when he fatigued.  Pt is aware he is unable to manage at home and is open to rehab for recovery before returning to private residence.  Continues to benefit from CIR therapies for increase in function and strength before returning home.     Follow Up Recommendations  CIR;Supervision/Assistance - 24 hour     Equipment Recommendations  Rolling walker with 5" wheels;3in1 (PT)    Recommendations for Other Services Rehab consult     Precautions / Restrictions Precautions Precautions: Fall Precaution Comments: left inattention, left hemiparesis Restrictions Weight Bearing Restrictions: No    Mobility  Bed Mobility Overal bed mobility: Needs Assistance Bed Mobility: Supine to Sit     Supine to sit: Mod assist;+2 for physical assistance     General bed mobility comments: Pt required assistance to roll and push with RLE and reach for rail with RUE to achieve sidleying at edge of bed.  He presents with L lateral lean/ with anterior lean as well.  Pt able to follow commands to scoot forward and correct posture.      Transfers Overall transfer level: Needs assistance Equipment used: Rolling walker (2 wheeled)(unable to keep L hand gripped to RW without hand over hand support.  ) Transfers: Sit to/from Stand Sit to Stand: Min assist;+2 physical assistance(once in standing requires mod=2 to max +2 to maintain standing balance due to L lateral lean.  )          General transfer comment: Cues for hand placement to push from seated surface with RUE.  Pt requires assistance to move to L UE as he is still neglectful of this limb during transitions.    Ambulation/Gait Ambulation/Gait assistance: Mod assist;+2 physical assistance Gait Distance (Feet): 10 Feet(+ 6 ft.  Pt more fatigued from ADLs with OT so session limited gait training and focused more on balance in standing while he performed ADLs.  ) Assistive device: Rolling walker (2 wheeled)(hand over hand assistance to keep hand on RW on LUE.  ) Gait Pattern/deviations: Step-to pattern;Decreased stance time - left;Decreased weight shift to right     General Gait Details: Pt required step by step for sequencing and RW placement.  Pt required cues for upper trunk control and maintaining posture during gait training.     Stairs             Wheelchair Mobility    Modified Rankin (Stroke Patients Only) Modified Rankin (Stroke Patients Only) Pre-Morbid Rankin Score: No symptoms Modified Rankin: Moderately severe disability     Balance Overall balance assessment: Needs assistance Sitting-balance support: Feet supported;Bilateral upper extremity supported Sitting balance-Leahy Scale: Poor       Standing balance-Leahy Scale: Poor Standing balance comment: L lateral lean with reliance on UE support and poor midline awareness, constant cueing for weight shifting to R.                             Cognition  Arousal/Alertness: Awake/alert Behavior During Therapy: WFL for tasks assessed/performed Overall Cognitive Status: Impaired/Different from baseline Area of Impairment: Problem solving;Awareness;Safety/judgement;Attention                   Current Attention Level: Sustained Memory: Decreased recall of precautions Following Commands: Follows one step commands with increased time Safety/Judgement: Decreased awareness of safety;Decreased awareness of  deficits Awareness: Emergent Problem Solving: Requires verbal cues;Requires tactile cues General Comments: Pt impulsive to return to sitting and stop acitivites when he is fatigued.  Pt continues to present with L sided inattention.        Exercises      General Comments        Pertinent Vitals/Pain Pain Assessment: No/denies pain    Home Living                      Prior Function            PT Goals (current goals can now be found in the care plan section) Acute Rehab PT Goals Patient Stated Goal: to be able to return home Potential to Achieve Goals: Fair Progress towards PT goals: Progressing toward goals    Frequency    Min 4X/week      PT Plan Current plan remains appropriate    Co-evaluation              AM-PAC PT "6 Clicks" Mobility   Outcome Measure  Help needed turning from your back to your side while in a flat bed without using bedrails?: A Little Help needed moving from lying on your back to sitting on the side of a flat bed without using bedrails?: A Lot Help needed moving to and from a bed to a chair (including a wheelchair)?: A Lot Help needed standing up from a chair using your arms (e.g., wheelchair or bedside chair)?: A Lot Help needed to walk in hospital room?: A Lot Help needed climbing 3-5 steps with a railing? : Total 6 Click Score: 12    End of Session Equipment Utilized During Treatment: Gait belt Activity Tolerance: Patient tolerated treatment well Patient left: in chair;with call bell/phone within reach;with family/visitor present;with nursing/sitter in room Nurse Communication: Mobility status;Precautions PT Visit Diagnosis: Other abnormalities of gait and mobility (R26.89);Muscle weakness (generalized) (M62.81);Unsteadiness on feet (R26.81);Other symptoms and signs involving the nervous system (R29.898)     Time: 9480-1655 PT Time Calculation (min) (ACUTE ONLY): 35 min  Charges:  $Neuromuscular Re-education: 8-22  mins                     Barry Woods, PTA Acute Rehabilitation Services Pager [phone] Office [phone]     Barry Woods Eli Hose 11/30/2018, 2:54 PM

## 2018-11-30 NOTE — Progress Notes (Signed)
Inpatient Rehabilitation-Admissions Coordinator   Followed up with pt and family regarding caregiver support at home and venue determination. Family wants to pursue CIR and plans to have recommended assist level through hired agency and family to supplement. AC continues to follow for insurance determination, with authorization process initiated on 11/29/18.   Jhonnie Garner, OTR/L  Rehab Admissions Coordinator  [phone] 11/30/2018 5:24 PM

## 2018-12-01 ENCOUNTER — Inpatient Hospital Stay (HOSPITAL_COMMUNITY)
Admission: RE | Admit: 2018-12-01 | Discharge: 2018-12-22 | DRG: 057 | Disposition: A | Discharge status: Home Health | Payer: Medicare HMO | Source: Intra-hospital | Attending: Physical Medicine & Rehabilitation | Admitting: Physical Medicine & Rehabilitation

## 2018-12-01 ENCOUNTER — Ambulatory Visit: Payer: Medicare HMO | Admitting: Cardiovascular Disease

## 2018-12-01 ENCOUNTER — Other Ambulatory Visit: Payer: Self-pay

## 2018-12-01 ENCOUNTER — Encounter (HOSPITAL_COMMUNITY): Payer: Self-pay | Admitting: *Deleted

## 2018-12-01 ENCOUNTER — Other Ambulatory Visit: Payer: Medicare HMO

## 2018-12-01 DIAGNOSIS — R32 Unspecified urinary incontinence: Secondary | ICD-10-CM | POA: Diagnosis present

## 2018-12-01 DIAGNOSIS — I10 Essential (primary) hypertension: Secondary | ICD-10-CM | POA: Diagnosis present

## 2018-12-01 DIAGNOSIS — T501X5A Adverse effect of loop [high-ceiling] diuretics, initial encounter: Secondary | ICD-10-CM | POA: Diagnosis present

## 2018-12-01 DIAGNOSIS — R209 Unspecified disturbances of skin sensation: Secondary | ICD-10-CM

## 2018-12-01 DIAGNOSIS — M069 Rheumatoid arthritis, unspecified: Secondary | ICD-10-CM | POA: Diagnosis not present

## 2018-12-01 DIAGNOSIS — I4821 Permanent atrial fibrillation: Secondary | ICD-10-CM | POA: Diagnosis not present

## 2018-12-01 DIAGNOSIS — E8770 Fluid overload, unspecified: Secondary | ICD-10-CM | POA: Diagnosis present

## 2018-12-01 DIAGNOSIS — I69254 Hemiplegia and hemiparesis following other nontraumatic intracranial hemorrhage affecting left non-dominant side: Secondary | ICD-10-CM | POA: Diagnosis not present

## 2018-12-01 DIAGNOSIS — Z7952 Long term (current) use of systemic steroids: Secondary | ICD-10-CM

## 2018-12-01 DIAGNOSIS — I480 Paroxysmal atrial fibrillation: Secondary | ICD-10-CM | POA: Diagnosis not present

## 2018-12-01 DIAGNOSIS — G8194 Hemiplegia, unspecified affecting left nondominant side: Secondary | ICD-10-CM | POA: Diagnosis not present

## 2018-12-01 DIAGNOSIS — I482 Chronic atrial fibrillation, unspecified: Secondary | ICD-10-CM | POA: Diagnosis present

## 2018-12-01 DIAGNOSIS — Z79899 Other long term (current) drug therapy: Secondary | ICD-10-CM | POA: Diagnosis not present

## 2018-12-01 DIAGNOSIS — I4819 Other persistent atrial fibrillation: Secondary | ICD-10-CM | POA: Diagnosis not present

## 2018-12-01 DIAGNOSIS — I69398 Other sequelae of cerebral infarction: Secondary | ICD-10-CM | POA: Diagnosis not present

## 2018-12-01 DIAGNOSIS — I613 Nontraumatic intracerebral hemorrhage in brain stem: Secondary | ICD-10-CM

## 2018-12-01 DIAGNOSIS — E876 Hypokalemia: Secondary | ICD-10-CM | POA: Diagnosis present

## 2018-12-01 DIAGNOSIS — Q283 Other malformations of cerebral vessels: Secondary | ICD-10-CM | POA: Diagnosis not present

## 2018-12-01 DIAGNOSIS — I619 Nontraumatic intracerebral hemorrhage, unspecified: Secondary | ICD-10-CM | POA: Diagnosis present

## 2018-12-01 DIAGNOSIS — Z7901 Long term (current) use of anticoagulants: Secondary | ICD-10-CM | POA: Diagnosis not present

## 2018-12-01 DIAGNOSIS — I4891 Unspecified atrial fibrillation: Secondary | ICD-10-CM | POA: Diagnosis present

## 2018-12-01 DIAGNOSIS — I610 Nontraumatic intracerebral hemorrhage in hemisphere, subcortical: Secondary | ICD-10-CM

## 2018-12-01 DIAGNOSIS — I69354 Hemiplegia and hemiparesis following cerebral infarction affecting left non-dominant side: Secondary | ICD-10-CM

## 2018-12-01 DIAGNOSIS — E871 Hypo-osmolality and hyponatremia: Secondary | ICD-10-CM | POA: Diagnosis not present

## 2018-12-01 DIAGNOSIS — R531 Weakness: Secondary | ICD-10-CM | POA: Diagnosis not present

## 2018-12-01 DIAGNOSIS — E854 Organ-limited amyloidosis: Secondary | ICD-10-CM | POA: Diagnosis not present

## 2018-12-01 DIAGNOSIS — E785 Hyperlipidemia, unspecified: Secondary | ICD-10-CM | POA: Diagnosis not present

## 2018-12-01 DIAGNOSIS — I680 Cerebral amyloid angiopathy: Secondary | ICD-10-CM | POA: Diagnosis present

## 2018-12-01 DIAGNOSIS — E7800 Pure hypercholesterolemia, unspecified: Secondary | ICD-10-CM

## 2018-12-01 MED ORDER — ATENOLOL 50 MG PO TABS
75.0000 mg | ORAL_TABLET | Freq: Every day | ORAL | Status: DC
  Administered 2018-12-02 – 2018-12-22 (×21): 75 mg via ORAL
  Filled 2018-12-01 (×21): qty 1

## 2018-12-01 MED ORDER — SORBITOL 70 % SOLN: 30.0000 mL | Freq: Every day | Status: DC | PRN

## 2018-12-01 MED ORDER — SIMVASTATIN 20 MG PO TABS
20.0000 mg | ORAL_TABLET | Freq: Every day | ORAL | Status: DC
  Administered 2018-12-01 – 2018-12-21 (×21): 20 mg via ORAL
  Filled 2018-12-01 (×21): qty 1

## 2018-12-01 MED ORDER — ATENOLOL 25 MG PO TABS: 75.0000 mg | ORAL_TABLET | Freq: Every day | ORAL | 1 refills | Status: DC

## 2018-12-01 MED ORDER — POTASSIUM CHLORIDE CRYS ER 20 MEQ PO TBCR
20.0000 meq | EXTENDED_RELEASE_TABLET | Freq: Every day | ORAL | Status: DC
  Administered 2018-12-02: 20 meq via ORAL
  Filled 2018-12-01: qty 1

## 2018-12-01 MED ORDER — SENNOSIDES-DOCUSATE SODIUM 8.6-50 MG PO TABS
1.0000 | ORAL_TABLET | Freq: Two times a day (BID) | ORAL | Status: DC
  Administered 2018-12-02 – 2018-12-07 (×4): 1 via ORAL
  Filled 2018-12-01 (×24): qty 1

## 2018-12-01 MED ORDER — ACETAMINOPHEN 160 MG/5ML PO SOLN: 650.0000 mg | ORAL | Status: DC | PRN

## 2018-12-01 MED ORDER — HYDROCORTISONE 1 % EX OINT
TOPICAL_OINTMENT | Freq: Two times a day (BID) | CUTANEOUS | Status: DC | PRN
  Administered 2018-12-11 – 2018-12-19 (×4): via TOPICAL
  Filled 2018-12-01: qty 28

## 2018-12-01 MED ORDER — HYDROCORTISONE 1 % EX OINT
TOPICAL_OINTMENT | CUTANEOUS | Status: DC | PRN
  Administered 2018-12-01: 1 via TOPICAL
  Filled 2018-12-01: qty 28

## 2018-12-01 MED ORDER — ACETAMINOPHEN 650 MG RE SUPP: 650.0000 mg | RECTAL | Status: DC | PRN

## 2018-12-01 MED ORDER — POTASSIUM CHLORIDE CRYS ER 20 MEQ PO TBCR: 20.0000 meq | EXTENDED_RELEASE_TABLET | Freq: Every day | ORAL | 1 refills | Status: DC

## 2018-12-01 MED ORDER — ACETAMINOPHEN 325 MG PO TABS: 650.0000 mg | ORAL_TABLET | ORAL | Status: DC | PRN

## 2018-12-01 NOTE — Care Management Important Message (Signed)
Important Message  Patient Details  Name: Barry Woods MRN: [id number] Date of Birth: [birth year]   Medicare Important Message Given:  Yes    Orbie Pyo 12/01/2018, 2:31 PM

## 2018-12-01 NOTE — Progress Notes (Signed)
Inpatient Rehabilitation-Admissions Coordinator   [facility] has received medical clearance and insurance approval for admit to CIR today. Pt and family updated on plan. AC has notified RN and CM/SW.   Please call if questions.   Jhonnie Garner, OTR/L  Rehab Admissions Coordinator  [phone] 12/01/2018 3:39 PM

## 2018-12-01 NOTE — Progress Notes (Signed)
Physical Therapy Treatment Patient Details Name: Barry Woods MRN: [id number] DOB: Jan 24, 1946 Today's Date: 12/01/2018    History of Present Illness 73 yo admitted with left weakness and right basal ganglia hemorrhage. Pmhx: Afib, HTN, HLD, RA    PT Comments    Pt performed gait training and lower extremity strengthening during session this pm.  He is presenting with strength improvement in L UE but continues to lack coordination.  Pt is aware that his needs cannot be met at home and he is agreement for agreesive rehab at [facility].  Plan for continued strengthening and functional training.  Pt is progressing well.    Follow Up Recommendations  CIR;Supervision/Assistance - 24 hour     Equipment Recommendations  Rolling walker with 5" wheels;3in1 (PT)    Recommendations for Other Services Rehab consult     Precautions / Restrictions Precautions Precautions: Fall Precaution Comments: left inattention, left hemiparesis Restrictions Weight Bearing Restrictions: No    Mobility  Bed Mobility               General bed mobility comments: Pt seated in recliner on arrival.    Transfers Overall transfer level: Needs assistance Equipment used: Rolling walker (2 wheeled)(Required hand over hand assistance for LUE grip on RW.) Transfers: Sit to/from Stand Sit to Stand: Min assist;+2 physical assistance         General transfer comment: Cues for hand placement and hand over hand on LUE to engage L arm in pushing from seated surface.    Ambulation/Gait Ambulation/Gait assistance: Mod assist;+2 safety/equipment Gait Distance (Feet): 60 Feet Assistive device: Rolling walker (2 wheeled)(hand over hand assistance to keep L hand on RW.  ) Gait Pattern/deviations: Step-to pattern;Decreased stance time - left;Decreased weight shift to right;Decreased step length - left;Decreased dorsiflexion - left     General Gait Details: Pt with improved cadence but when fatigue he tends to drap L  foot and required cues to increase stride and improve foot clearance.     Stairs             Wheelchair Mobility    Modified Rankin (Stroke Patients Only) Modified Rankin (Stroke Patients Only) Pre-Morbid Rankin Score: No symptoms Modified Rankin: Moderately severe disability     Balance Overall balance assessment: Needs assistance Sitting-balance support: Feet supported;Bilateral upper extremity supported Sitting balance-Leahy Scale: Fair       Standing balance-Leahy Scale: Poor Standing balance comment: Balance remains poor continues to require assistance to weight shift but much improved from last session.                              Cognition Arousal/Alertness: Awake/alert Behavior During Therapy: WFL for tasks assessed/performed Overall Cognitive Status: Impaired/Different from baseline Area of Impairment: Problem solving;Awareness;Safety/judgement;Attention                   Current Attention Level: Sustained Memory: Decreased recall of precautions Following Commands: Follows one step commands with increased time Safety/Judgement: Decreased awareness of safety;Decreased awareness of deficits Awareness: Emergent Problem Solving: Requires verbal cues;Requires tactile cues;Difficulty sequencing General Comments: Pt presents with decreased attention and thoroughness with tasks, impulsivity increases with fatigue.       Exercises General Exercises - Lower Extremity Ankle Circles/Pumps: AROM;Left;10 reps;Supine Quad Sets: AROM;Left;10 reps;Supine Heel Slides: Left;AAROM;10 reps;Supine Hip ABduction/ADduction: AROM;Left;10 reps;Supine Straight Leg Raises: Left;AAROM;10 reps;Supine Other Exercises Other Exercises: L upper extremity elbow flex/ extension 1x10 reps.   Other Exercises: L  shoulder flexion/ extension 1x10 reps.   Other Exercises: stretching L digits into extension and educated on self stretching.      General Comments         Pertinent Vitals/Pain Pain Assessment: No/denies pain    Home Living                      Prior Function            PT Goals (current goals can now be found in the care plan section) Acute Rehab PT Goals Patient Stated Goal: to be able to return home Potential to Achieve Goals: Fair Progress towards PT goals: Progressing toward goals    Frequency    Min 4X/week      PT Plan Current plan remains appropriate    Co-evaluation              AM-PAC PT "6 Clicks" Mobility   Outcome Measure  Help needed turning from your back to your side while in a flat bed without using bedrails?: A Little Help needed moving from lying on your back to sitting on the side of a flat bed without using bedrails?: A Lot Help needed moving to and from a bed to a chair (including a wheelchair)?: A Lot Help needed standing up from a chair using your arms (e.g., wheelchair or bedside chair)?: A Lot Help needed to walk in hospital room?: A Lot Help needed climbing 3-5 steps with a railing? : A Lot 6 Click Score: 13    End of Session Equipment Utilized During Treatment: Gait belt Activity Tolerance: Patient tolerated treatment well Patient left: in chair;with call bell/phone within reach;with family/visitor present;with nursing/sitter in room Nurse Communication: Mobility status;Precautions PT Visit Diagnosis: Other abnormalities of gait and mobility (R26.89);Muscle weakness (generalized) (M62.81);Unsteadiness on feet (R26.81);Other symptoms and signs involving the nervous system (R29.898)     Time: 9563-8756 PT Time Calculation (min) (ACUTE ONLY): 23 min  Charges:  $Gait Training: 8-22 mins $Therapeutic Exercise: 8-22 mins                     Governor Rooks, PTA Acute Rehabilitation Services Pager [phone] Office [phone]     Sten Dematteo Eli Hose 12/01/2018, 2:59 PM

## 2018-12-01 NOTE — Progress Notes (Signed)
Physical Medicine and Rehabilitation Consult Reason for Consult:  Left side weakness Referring Physician: Dr. Leonie Man   HPI: Barry Woods is a 73 y.o.right handed male history of hyperlipidemia, hypertension, rheumatoid arthritis maintained on chronic prednisone , chronic atrial fibrillation maintained on Eliquis. Per chart review and family, patient lives alone independent prior to admission. Multilevel home bedroom bath upstairs.Presented 11/27/2017 with left-sided weakness/slurred speech as well as a fall. Cranial CT scan reviewed, showing right brain hemorrhage.  Per report, acute hemorrhage within the right lentiform nucleus measuring up to 3.9 cm. Associated edema and mass effect. Patient did receive Andexxa to reverse anticoagulation.noted mild hyponatremia 131 as well as creatinine 1.33. neurosurgery Dr. Earle Gell consulted in regards to hemorrhage. Latest follow-up MRI without change in right basal ganglia hematoma. There was a noted 4 mm acute infarction in the medial left subcortical white matter. Maintained on Cleviprex for blood pressure control. Therapy evaluations completed with recommendations of physical medicine rehabilitation consult.  Review of Systems  Constitutional: Negative for chills and fever.  HENT: Negative for hearing loss.   Eyes: Negative for blurred vision and double vision.  Respiratory: Negative for cough and shortness of breath.   Gastrointestinal: Positive for constipation. Negative for nausea and vomiting.  Genitourinary: Negative for dysuria, flank pain and hematuria.  Musculoskeletal: Positive for falls and myalgias.  Skin: Negative for rash.  Neurological: Positive for sensory change, speech change, focal weakness and headaches.  All other systems reviewed and are negative.      Past Medical History:  Diagnosis Date  . Arthritis   . Cerebral cavernoma   . Hyperlipidemia   . Hypertension   . Rheumatoid arthritis [facility])         Past  Surgical History:  Procedure Laterality Date  . COLONOSCOPY    . LIPOMA EXCISION    . PALATE / UVULA BIOPSY / EXCISION    . TONSILLECTOMY     age 49        Family History  Problem Relation Age of Onset  . Colon cancer Mother 40       57  . Hepatitis C Father   . CAD Father    Social History:  reports that he has never smoked. He has never used smokeless tobacco. He reports current alcohol use of about 14.0 standard drinks of alcohol per week. He reports that he does not use drugs.  Allergies:      Allergies  Allergen Reactions  . Atorvastatin Hives  . Cetirizine Hcl Hives         Medications Prior to Admission  Medication Sig Dispense Refill  . Adalimumab (HUMIRA) 40 MG/0.8ML PSKT Inject 40 mg into the skin See admin instructions. Every other week     . apixaban (ELIQUIS) 5 MG TABS tablet Take 1 tablet (5 mg total) by mouth 2 (two) times daily. 60 tablet 11  . atenolol (TENORMIN) 50 MG tablet Take 1.5 tablets (75 mg total) by mouth daily. 135 tablet 1  . furosemide (LASIX) 40 MG tablet Take 0.5 tablets (20 mg total) by mouth daily. (Patient taking differently: Take 40 mg by mouth daily. ) 45 tablet 3  . potassium chloride SA (K-DUR,KLOR-CON) 20 MEQ tablet Take 0.5 tablets (10 mEq total) by mouth daily. (Patient taking differently: Take 20 mEq by mouth daily. ) 45 tablet 3  . predniSONE (DELTASONE) 10 MG tablet Take 10-40 mg by mouth See admin instructions. Take 4 tabs daily for 2 days, then 3 tabs daily for 2  days then 2 tabs daily for 2 days then 1 tab daily for 2 days started on 11/10/18    . simvastatin (ZOCOR) 20 MG tablet Take 1 tablet (20 mg total) by mouth at bedtime. 100 tablet 4    Home: Home Living Family/patient expects to be discharged to:: Private residence Living Arrangements: Alone Available Help at Discharge: Family Type of Home: House Home Access: Level entry Home Layout: Multi-level, Bed/bath upstairs, 1/2 bath on main  level Alternate Level Stairs-Number of Steps: 2 flights to bedroom Home Equipment: None  Functional History: Prior Function Level of Independence: Independent Functional Status:  Mobility: Bed Mobility Overal bed mobility: Needs Assistance Bed Mobility: Supine to Sit Supine to sit: Mod assist General bed mobility comments: mod assist for trunk support and L LE towards EOB, scooting foward  Transfers Overall transfer level: Needs assistance Equipment used: 1 person hand held assist Transfers: Sit to/from Stand, Google Transfers Sit to Stand: Mod assist, +2 physical assistance, +2 safety/equipment Squat pivot transfers: Max assist, +2 physical assistance, +2 safety/equipment General transfer comment: mod assist +2 to stand with L lateral lean and poor awareness to midline positioning, max assist for squat pivot towards R side onto commode   ADL: ADL Overall ADL's : Needs assistance/impaired Eating/Feeding: Set up, Supervision/ safety, Sitting Grooming: Minimal assistance, Sitting Grooming Details (indicate cue type and reason): L lateral lean in unsupported sitting  Upper Body Bathing: Sitting, Minimal assistance Lower Body Bathing: Maximal assistance, +2 for physical assistance, +2 for safety/equipment, Sit to/from stand Upper Body Dressing : Sitting, Moderate assistance Lower Body Dressing: Maximal assistance, +2 for physical assistance, +2 for safety/equipment, Sit to/from stand Toilet Transfer: Maximal assistance, +2 for physical assistance, +2 for safety/equipment, Squat-pivot, Comfort height toilet Functional mobility during ADLs: Maximal assistance, +2 for physical assistance, +2 for safety/equipment General ADL Comments: patient determined to use regular commode, but refused to use with therapist present; but presents with L lateral lean in sitting and poor awareness of deficits and safety  Cognition: Cognition Overall Cognitive Status: Impaired/Different from  baseline Orientation Level: Oriented X4 Cognition Arousal/Alertness: Awake/alert Behavior During Therapy: Impulsive Overall Cognitive Status: Impaired/Different from baseline Area of Impairment: Attention, Following commands, Memory, Safety/judgement, Awareness, Problem solving Current Attention Level: Sustained Memory: Decreased recall of precautions, Decreased short-term memory Following Commands: Follows one step commands with increased time Safety/Judgement: Decreased awareness of safety, Decreased awareness of deficits Awareness: Emergent Problem Solving: Difficulty sequencing, Requires verbal cues General Comments: Patient impulsive throughout session, decreased awareness of deficits and assistance needed for mobility.   Blood pressure 128/84, pulse 70, temperature 98.2 F (36.8 C), temperature source Oral, resp. rate (!) 21, height 5\' 7"  (1.702 m), weight (S) 74.9 kg, SpO2 95 %. Physical Exam  Vitals reviewed. Constitutional: He is oriented to person, place, and time. He appears well-developed and well-nourished.  HENT:  Head: Normocephalic and atraumatic.  Eyes: EOM are normal. Right eye exhibits no discharge. Left eye exhibits no discharge.  Pupils reactive to light  Neck: Normal range of motion. Neck supple. No thyromegaly present.  Cardiovascular:  Irregularly irregular  Respiratory: Effort normal and breath sounds normal. No respiratory distress.  GI: Soft. Bowel sounds are normal. He exhibits no distension.  Musculoskeletal:     Comments: No edema or tenderness in extremities  Neurological: He is alert and oriented to person, place, and time.  Patient is alert.  Speech is a bit dysarthric but intelligible.  Follows full commands.  Provides his name , age and date of  birth. Left facial weakness Right upper extremity/right lower extremity: 5/5 proximal distal Left upper extremity/left lower extremity: 4-/5 proximal distal Sensation diminished to light touch LUE/LLE   Skin: Skin is warm and dry.  Psychiatric: His behavior is normal. His affect is blunt.    LabResultsLast24Hours       Results for orders placed or performed during the hospital encounter of 11/27/18 (from the past 24 hour(s))  Sodium     Status: None   Collection Time: 11/28/18  8:10 AM  Result Value Ref Range   Sodium 138 135 - 145 mmol/L  Sodium     Status: None   Collection Time: 11/28/18  3:25 PM  Result Value Ref Range   Sodium 140 135 - 145 mmol/L  Sodium     Status: None   Collection Time: 11/28/18  8:10 PM  Result Value Ref Range   Sodium 138 135 - 145 mmol/L  Sodium     Status: None   Collection Time: 11/29/18  4:30 AM  Result Value Ref Range   Sodium 136 135 - 145 mmol/L  Lipid panel     Status: None   Collection Time: 11/29/18  4:30 AM  Result Value Ref Range   Cholesterol 137 0 - 200 mg/dL   Triglycerides 94 <150 mg/dL   HDL 42 >40 mg/dL   Total CHOL/HDL Ratio 3.3 RATIO   VLDL 19 0 - 40 mg/dL   LDL Cholesterol 76 0 - 99 mg/dL  Triglycerides     Status: None   Collection Time: 11/29/18  4:30 AM  Result Value Ref Range   Triglycerides 94 <150 mg/dL      ImagingResults(Last48hours)  Ct Head Wo Contrast  Result Date: 11/28/2018 CLINICAL DATA:  Follow-up examination for intracerebral hemorrhage. EXAM: CT HEAD WITHOUT CONTRAST TECHNIQUE: Contiguous axial images were obtained from the base of the skull through the vertex without intravenous contrast. COMPARISON:  Prior CT from 11/27/2018. FINDINGS: Brain: Acute intraparenchymal hemorrhage centered at the right lentiform nucleus stable in size measuring 4.4 x 2.5 x 3.7 cm. Surrounding low-density vasogenic edema mildly increased similar regional mass effect with partial effacement of the right lateral ventricle and 2 mm right-to-left shift. No hydrocephalus or ventricular trapping. Basilar cisterns remain patent. No intraventricular extension or other complication identified. Few additional  tiny foci of subarachnoid blood noted within the left cerebral hemisphere (series 3, image 20, 26), grossly similar to previous. No other new acute intracranial hemorrhage. No acute large vessel territory infarct. Underlying atrophy with chronic microvascular ischemic disease again noted. No extra-axial fluid collection. Vascular: No hyperdense vessel. Calcified atherosclerosis at the skull base. Skull: Scalp soft tissues and calvarium within normal limits. Sinuses/Orbits: Globes and orbital soft tissues within normal limits. Paranasal sinuses and mastoid air cells are clear. Other: None. IMPRESSION: 1. No significant interval change in size and appearance of intraparenchymal hemorrhage centered at the right basal ganglia, with similar localized edema and regional mass effect. Associated 2 mm right-to-left midline shift unchanged. 2. Otherwise stable appearance of the brain with no other new acute intracranial abnormality identified. Electronically Signed   By: Jeannine Boga M.D.   On: 11/28/2018 01:34   Ct Head Wo Contrast  Result Date: 11/27/2018 CLINICAL DATA:  73 y/o M; condition deteriorating. Intracranial hemorrhage. EXAM: CT HEAD WITHOUT CONTRAST TECHNIQUE: Contiguous axial images were obtained from the base of the skull through the vertex without intravenous contrast. COMPARISON:  11/27/2017 CT head. FINDINGS: Brain: Hemorrhage centered within the right lentiform nucleus measures 4.4  x 2.5 x 3.7 cm (volume = 21 cm^3)(AP x ML x CC series 3, image 19 and series 6, image 24). Surrounding edema and associated mass effect is also mildly increased with 2 mm right-to-left midline shift increased effacement of the right lateral ventricle. No intraventricular subarachnoid hemorrhage identified. 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 Stable chronic microvascular ischemic changes and volume loss of the brain. Stable small density in the left frontal white matter corresponding to  cavernoma on prior MRI. Vascular: Calcific atherosclerosis of carotid siphons. No hyperdense vessel. Skull: Normal. Negative for fracture or focal lesion. Sinuses/Orbits: No acute finding. Other: None. IMPRESSION: 1. Mild increase in size of hemorrhage within the right lentiform nucleus measuring up to 4.4 cm, 21 cc. Previously 3.9 cm and 17 cc. 2. Mildly increased associated mass effect with increased effacement of right lateral ventricle and 2 mm right-to-left midline shift. These results were called by telephone at the time of interpretation on 11/27/2018 at 8:35 pm to Dr. Kerney Elbe , who verbally acknowledged these results. Electronically Signed   By: Kristine Garbe M.D.   On: 11/27/2018 20:39   Mr Jodene Nam Head Wo Contrast  Result Date: 11/28/2018 CLINICAL DATA:  Follow-up intraparenchymal hemorrhage in the right basal ganglia. EXAM: MRI HEAD WITHOUT CONTRAST MRA HEAD WITHOUT CONTRAST TECHNIQUE: Multiplanar, multiecho pulse sequences of the brain and surrounding structures were obtained without intravenous contrast. Angiographic images of the head were obtained using MRA technique without contrast. COMPARISON:  CT same day FINDINGS: MRI HEAD FINDINGS Brain: Diffusion imaging shows a 4 mm acute infarction in the left medial frontal subcortical white matter. Right basal ganglia hematoma measuring approximately 3.7 x 4.4 x 2.5 cm as seen on the previous CT. Surrounding edema. No evidence of an underlying mass lesion or high flow vascular malformation. Old hemosiderin deposition related to previous hemorrhage in the left basal ganglia/external capsule, left frontal white matter, the right lateral thalamus and the right posteromedial temporal lobe. Moderate chronic small-vessel ischemic changes affect the cerebral hemispheric white matter. No hydrocephalus or extra-axial collection. Right-to-left shift of 2-3 mm is unchanged. Vascular: Major vessels at the base of the brain show flow. Skull and upper  cervical spine: Negative Sinuses/Orbits: Clear/normal Other: None MRA HEAD FINDINGS Both internal carotid arteries are widely patent through the skull base and siphon regions. The anterior and middle cerebral vessels are patent without proximal stenosis, aneurysm or vascular malformation. Posterior cerebral arteries receive most of there supply from the anterior circulation. Both vertebral arteries are patent to the basilar. No basilar stenosis. Posterior circulation branch vessels are patent. IMPRESSION: MRI head: Right basal ganglia hematoma as seen previously without apparent enlargement. Surrounding edema. Mild mass effect with right-to-left shift of 2-3 mm. No sign of underlying tumor or high flow vascular malformation. Chronic small-vessel ischemic changes elsewhere within the brain. Other foci of hemosiderin deposition consistent with old prior hemorrhage. The tendency to hemorrhage within the brain suggest the possibility of amyloid angiopathy. 4 mm acute infarction in the medial left subcortical white matter. Moderate chronic small-vessel ischemic changes elsewhere throughout the brain. Electronically Signed   By: Nelson Chimes M.D.   On: 11/28/2018 14:26   Mr Brain Wo Contrast  Result Date: 11/28/2018 CLINICAL DATA:  Follow-up intraparenchymal hemorrhage in the right basal ganglia. EXAM: MRI HEAD WITHOUT CONTRAST MRA HEAD WITHOUT CONTRAST TECHNIQUE: Multiplanar, multiecho pulse sequences of the brain and surrounding structures were obtained without intravenous contrast. Angiographic images of the head were obtained using MRA technique without  contrast. COMPARISON:  CT same day FINDINGS: MRI HEAD FINDINGS Brain: Diffusion imaging shows a 4 mm acute infarction in the left medial frontal subcortical white matter. Right basal ganglia hematoma measuring approximately 3.7 x 4.4 x 2.5 cm as seen on the previous CT. Surrounding edema. No evidence of an underlying mass lesion or high flow vascular malformation.  Old hemosiderin deposition related to previous hemorrhage in the left basal ganglia/external capsule, left frontal white matter, the right lateral thalamus and the right posteromedial temporal lobe. Moderate chronic small-vessel ischemic changes affect the cerebral hemispheric white matter. No hydrocephalus or extra-axial collection. Right-to-left shift of 2-3 mm is unchanged. Vascular: Major vessels at the base of the brain show flow. Skull and upper cervical spine: Negative Sinuses/Orbits: Clear/normal Other: None MRA HEAD FINDINGS Both internal carotid arteries are widely patent through the skull base and siphon regions. The anterior and middle cerebral vessels are patent without proximal stenosis, aneurysm or vascular malformation. Posterior cerebral arteries receive most of there supply from the anterior circulation. Both vertebral arteries are patent to the basilar. No basilar stenosis. Posterior circulation branch vessels are patent. IMPRESSION: MRI head: Right basal ganglia hematoma as seen previously without apparent enlargement. Surrounding edema. Mild mass effect with right-to-left shift of 2-3 mm. No sign of underlying tumor or high flow vascular malformation. Chronic small-vessel ischemic changes elsewhere within the brain. Other foci of hemosiderin deposition consistent with old prior hemorrhage. The tendency to hemorrhage within the brain suggest the possibility of amyloid angiopathy. 4 mm acute infarction in the medial left subcortical white matter. Moderate chronic small-vessel ischemic changes elsewhere throughout the brain. Electronically Signed   By: Nelson Chimes M.D.   On: 11/28/2018 14:26   Dg Swallowing Func-speech Pathology  Result Date: 11/28/2018 Objective Swallowing Evaluation: Type of Study: MBS-Modified Barium Swallow Study  Patient Details Name: Barry Woods MRN: [id number] Date of Birth: 11/14/1946 Today's Date: 11/28/2018 Time: SLP Start Time (ACUTE ONLY): 1400 -SLP Stop Time  (ACUTE ONLY): 1430 SLP Time Calculation (min) (ACUTE ONLY): 30 min Past Medical History: Past Medical History: Diagnosis Date . Arthritis  . Cerebral cavernoma  . Hyperlipidemia  . Hypertension  . Rheumatoid arthritis [facility])  Past Surgical History: Past Surgical History: Procedure Laterality Date . COLONOSCOPY   . LIPOMA EXCISION   . PALATE / UVULA BIOPSY / EXCISION   . TONSILLECTOMY    age 66 HPI: The patient is a 73 year old white male who was admitted yesterday with a right basal ganglia hemorrhage.  No data recorded Assessment / Plan / Recommendation CHL IP CLINICAL IMPRESSIONS 11/28/2018 Clinical Impression Pt demonstrates mild pharyngeal weakness with decreased effort and duration of laryngeal elevation and hyoid excursion. This leads to mild vallecular residue and slightly decreased laryngeal closure that at times leads to trace penetration and aspiraiton events during and after the swallow. The pt senses this penetrate with a throat clear, though needs cues to apply force to fully eject. A chin tuck was beneficial in decreased residue and improving laryngeal protection. recommend the pt initiate a mechanical soft diet and thin liquids with a chin tuck and intermittent throat clearing to eject aspirate if present. Would benefit from EMST to target stegth and respiratory drive. Will follow for tolerance.  SLP Visit Diagnosis Dysphagia, oropharyngeal phase (R13.12) Attention and concentration deficit following -- Frontal lobe and executive function deficit following -- Impact on safety and function Moderate aspiration risk   CHL IP TREATMENT RECOMMENDATION 11/28/2018 Treatment Recommendations Therapy as outlined in treatment plan below  Prognosis 11/28/2018 Prognosis for Safe Diet Advancement Good Barriers to Reach Goals -- Barriers/Prognosis Comment -- CHL IP DIET RECOMMENDATION 11/28/2018 SLP Diet Recommendations Dysphagia 3 (Mech soft) solids;Thin liquid Liquid Administration via Cup;Straw Medication Administration  Whole meds with liquid Compensations Slow rate;Small sips/bites;Chin tuck;Clear throat intermittently Postural Changes Remain semi-upright after after feeds/meals (Comment)   No flowsheet data found.  CHL IP FOLLOW UP RECOMMENDATIONS 11/28/2018 Follow up Recommendations Inpatient Rehab   CHL IP FREQUENCY AND DURATION 11/28/2018 Speech Therapy Frequency (ACUTE ONLY) min 2x/week Treatment Duration 2 weeks      CHL IP ORAL PHASE 11/28/2018 Oral Phase Impaired Oral - Pudding Teaspoon -- Oral - Pudding Cup -- Oral - Honey Teaspoon -- Oral - Honey Cup -- Oral - Nectar Teaspoon -- Oral - Nectar Cup -- Oral - Nectar Straw -- Oral - Thin Teaspoon -- Oral - Thin Cup -- Oral - Thin Straw -- Oral - Puree -- Oral - Mech Soft -- Oral - Regular -- Oral - Multi-Consistency -- Oral - Pill -- Oral Phase - Comment slightly increased effort to masticate solids  CHL IP PHARYNGEAL PHASE 11/28/2018 Pharyngeal Phase Impaired Pharyngeal- Pudding Teaspoon -- Pharyngeal -- Pharyngeal- Pudding Cup -- Pharyngeal -- Pharyngeal- Honey Teaspoon -- Pharyngeal -- Pharyngeal- Honey Cup -- Pharyngeal -- Pharyngeal- Nectar Teaspoon Reduced epiglottic inversion;Reduced anterior laryngeal mobility;Pharyngeal residue - valleculae;Reduced tongue base retraction Pharyngeal -- Pharyngeal- Nectar Cup -- Pharyngeal -- Pharyngeal- Nectar Straw Reduced epiglottic inversion;Reduced anterior laryngeal mobility;Pharyngeal residue - valleculae;Reduced tongue base retraction Pharyngeal -- Pharyngeal- Thin Teaspoon -- Pharyngeal -- Pharyngeal- Thin Cup Reduced epiglottic inversion;Reduced anterior laryngeal mobility;Pharyngeal residue - valleculae;Reduced tongue base retraction;Penetration/Apiration after swallow;Pharyngeal residue - pyriform Pharyngeal Material enters airway, passes BELOW cords then ejected out;Material does not enter airway Pharyngeal- Thin Straw Reduced epiglottic inversion;Reduced anterior laryngeal mobility;Pharyngeal residue - valleculae;Reduced tongue  base retraction;Penetration/Aspiration during swallow;Trace aspiration Pharyngeal Material enters airway, remains ABOVE vocal cords and not ejected out;Material enters airway, CONTACTS cords and then ejected out;Material does not enter airway Pharyngeal- Puree Reduced epiglottic inversion;Reduced anterior laryngeal mobility;Reduced tongue base retraction;Pharyngeal residue - valleculae Pharyngeal -- Pharyngeal- Mechanical Soft -- Pharyngeal -- Pharyngeal- Regular Reduced epiglottic inversion;Reduced anterior laryngeal mobility;Reduced tongue base retraction;Pharyngeal residue - valleculae Pharyngeal -- Pharyngeal- Multi-consistency -- Pharyngeal -- Pharyngeal- Pill -- Pharyngeal -- Pharyngeal Comment --  No flowsheet data found. Herbie Baltimore, MA CCC-SLP Acute Rehabilitation Services Pager [phone] Office [phone] Lynann Beaver 11/28/2018, 3:01 PM              Ct Head Code Stroke Wo Contrast  Result Date: 11/27/2018 CLINICAL DATA:  Code stroke. 73 y/o M; left-sided weakness and facial droop. EXAM: CT HEAD WITHOUT CONTRAST TECHNIQUE: Contiguous axial images were obtained from the base of the skull through the vertex without intravenous contrast. COMPARISON:  None. FINDINGS: Brain: Acute hemorrhage centered within the right lentiform nucleus measuring 3.9 x 2.3 x 3.7 cm (volume = 17 cm^3), series 3, image 17 and series 6, image 20. Mild surrounding edema. Associated local mass effect partially effaces the right lateral ventricle. No significant midline shift. No downward herniation. No intraventricular hemorrhage or subarachnoid hemorrhage. Stable background of chronic microvascular ischemic changes and volume loss of the brain. Small density within the left frontal lobe corresponds to cavernoma on prior MRI of the head. Vascular: Calcific atherosclerosis of carotid siphons. No hyperdense vessel identified. Skull: Normal. Negative for fracture or focal lesion. Sinuses/Orbits: No acute finding.  Other: None. ASPECTS [facility] Stroke Program Early CT Score) - Ganglionic level infarction (caudate,  lentiform nuclei, internal capsule, insula, M1-M3 cortex): 7 - Supraganglionic infarction (M4-M6 cortex): 3 Total score (0-10 with 10 being normal): 10 IMPRESSION: 1. Acute hemorrhage within the right lentiform nucleus measuring up to 3.9 cm, 17 cc. 2. Associated edema and mass effect results in partial effacement of the right lateral ventricle. No midline shift or herniation. 3. ASPECTS is not applicable. 4. Stable background of chronic microvascular ischemic changes and volume loss of the brain. Left frontal cavernoma again noted. Critical Value/emergent results were called by telephone at the time of interpretation on 11/27/2018 at 7:06 pm to Dr. Dr. Rory Percy, who verbally acknowledged these results. Electronically Signed   By: Kristine Garbe M.D.   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  Records reviewed and summated above. Stroke: Continue secondary stroke prophylaxis and Risk Factor Modification listed below:   Blood Pressure Management:  Continue current medication with prn's with permisive HTN per primary team Left sided hemiparesis Motor recovery: Fluoxetine  1. Does the need for close, 24 hr/day medical supervision in concert with the patient's rehab needs make it unreasonable for this patient to be served in a less intensive setting? Yes  2. Co-Morbidities requiring supervision/potential complications: hyponatremia (continue to monitor, treat if necessary), hyperlipidemia, HTN (monitor and provide prns in accordance with increased physical exertion and pain), rheumatoid arthritis (cont prednisone), atrial fibrillation (resume anticoagulation when appropriate, monitor heart rate with increased physical activity), tachypnea (monitor RR and O2  Sats with increased physical exertion), AKI (avoid nephrotoxic meds) 3. Due to safety, disease management and patient education, does the patient require 24 hr/day rehab nursing? Yes 4. Does the patient require coordinated care of a physician, rehab nurse, PT (1-2 hrs/day, 5 days/week), OT (1-2 hrs/day, 5 days/week) and SLP (1-2 hrs/day, 5 days/week) to address physical and functional deficits in the context of the above medical diagnosis(es)? Yes Addressing deficits in the following areas: balance, endurance, locomotion, strength, transferring, bathing, dressing, toileting and psychosocial support 5. Can the patient actively participate in an intensive therapy program of at least 3 hrs of therapy per day at least 5 days per week? Yes 6. The potential for patient to make measurable gains while on inpatient rehab is excellent 7. Anticipated functional outcomes upon discharge from inpatient rehab are supervision and min assist  with PT, supervision and min assist with OT, independent and modified independent with SLP. 8. Estimated rehab length of stay to reach the above functional goals is: 14-18 days. 9. Anticipated D/C setting: Home 10. Anticipated post D/C treatments: HH therapy and Home excercise program 11. Overall Rehab/Functional Prognosis: good  RECOMMENDATIONS: This patient's condition is appropriate for continued rehabilitative care in the following setting: CIR when medically appropriate if necessary caregiver support and suitable discharge disposition available. Patient has agreed to participate in recommended program. Yes Note that insurance prior authorization may be required for reimbursement for recommended care.  Comment: Rehab Admissions Coordinator to follow up.   I have personally performed a face to face diagnostic evaluation, including, but not limited to relevant history and physical exam findings, of this patient and developed relevant assessment and plan.  Additionally,  I have reviewed and concur with the physician assistant's documentation above.   Delice Lesch, MD, ABPMR Lavon Paganini Angiulli, PA-C 11/29/2018        Revision History  Routing History                    

## 2018-12-01 NOTE — H&P (Signed)
Physical Medicine and Rehabilitation Admission H&P       HPI: Barry Woods 73 year old right-handed male with history of hyperlipidemia, hypertension, rheumatoid arthritis maintained on chronic prednisone and receives scheduled injections of Humira, chronic atrial fibrillation recently placed on Eliquis. Per chart review and family, patient lives alone independent prior to admission. Multilevel home with bedroom and bathroom upstairs. Local family works. Presented 11/27/2018 with left-sided weakness and slurred speech as well as a fall. Cranial CT scan reviewed showing right brain hemorrhage.Report of acute hemorrhage within the right lentiform nucleus measuring up to 3.9 cm. Associated edema and mass effect. Patient did receive Andexxa to reverse anticoagulation. Also noted mild hyponatremia 131 as well as creatinine 1.33. Neurosurgery Dr. Newman Pies consulted in regards to hemorrhage at 5 conservative care with latest follow-up MRI without change in right basal ganglia hematoma. There was noted a 4 mm acute infarction in the medial left subcortical white matter. No current plan for anticoagulation due to hemorrhage. Initially maintained on Cleviprex for blood pressure control. Tolerating a mechanical soft diet. Therapy evaluations completed with recommendations of physical medicine rehabilitation consult. Patient was admitted for a comprehensive rehabilitation program.        Review of Systems  Constitutional: Negative for chills and fever.  HENT: Negative for hearing loss.   Eyes: Negative for blurred vision and double vision.  Respiratory: Negative for cough and shortness of breath.   Cardiovascular: Negative for chest pain, palpitations and leg swelling.  Gastrointestinal: Positive for constipation.  Genitourinary: Negative for dysuria, flank pain and hematuria.  Musculoskeletal: Positive for myalgias.  Skin: Negative for rash.  Neurological: Positive for sensory change, speech  change, focal weakness and headaches.  All other systems reviewed and are negative.       Past Medical History:  Diagnosis Date  . Arthritis    . Cerebral cavernoma    . Hyperlipidemia    . Hypertension    . Rheumatoid arthritis [facility])           Past Surgical History:  Procedure Laterality Date  . COLONOSCOPY      . LIPOMA EXCISION      . PALATE / UVULA BIOPSY / EXCISION      . TONSILLECTOMY        age 19         Family History  Problem Relation Age of Onset  . Colon cancer Mother 41        57  . Hepatitis C Father    . CAD Father      Social History:  reports that he has never smoked. He has never used smokeless tobacco. He reports current alcohol use of about 14.0 standard drinks of alcohol per week. He reports that he does not use drugs. Allergies:      Allergies  Allergen Reactions  . Atorvastatin Hives  . Cetirizine Hcl Hives          Medications Prior to Admission  Medication Sig Dispense Refill  . Adalimumab (HUMIRA) 40 MG/0.8ML PSKT Inject 40 mg into the skin See admin instructions. Every other week       . apixaban (ELIQUIS) 5 MG TABS tablet Take 1 tablet (5 mg total) by mouth 2 (two) times daily. 60 tablet 11  . atenolol (TENORMIN) 50 MG tablet Take 1.5 tablets (75 mg total) by mouth daily. 135 tablet 1  . furosemide (LASIX) 40 MG tablet Take 0.5 tablets (20 mg total) by mouth daily. (Patient taking differently: Take 40 mg by  mouth daily. ) 45 tablet 3  . potassium chloride SA (K-DUR,KLOR-CON) 20 MEQ tablet Take 0.5 tablets (10 mEq total) by mouth daily. (Patient taking differently: Take 20 mEq by mouth daily. ) 45 tablet 3  . predniSONE (DELTASONE) 10 MG tablet Take 10-40 mg by mouth See admin instructions. Take 4 tabs daily for 2 days, then 3 tabs daily for 2 days then 2 tabs daily for 2 days then 1 tab daily for 2 days started on 11/10/18      . simvastatin (ZOCOR) 20 MG tablet Take 1 tablet (20 mg total) by mouth at bedtime. 100 tablet 4      Drug Regimen  Review Drug regimen was reviewed and remains appropriate with no significant issues identified   Home: Home Living Family/patient expects to be discharged to:: Private residence Living Arrangements: Alone Available Help at Discharge: Family Type of Home: House Home Access: Level entry Home Layout: Multi-level, Bed/bath upstairs, 1/2 bath on main level Alternate Level Stairs-Number of Steps: 2 flights to bedroom Home Equipment: None  Lives With: Spouse   Functional History: Prior Function Level of Independence: Independent   Functional Status:  Mobility: Bed Mobility Overal bed mobility: Needs Assistance Bed Mobility: Supine to Sit Supine to sit: Mod assist, +2 for physical assistance General bed mobility comments: Pt required assistance to roll and push with RLE and reach for rail with RUE to achieve sidleying at edge of bed.  He presents with L lateral lean/ with anterior lean as well.  Pt able to follow commands to scoot forward and correct posture.     Transfers Overall transfer level: Needs assistance Equipment used: Rolling walker (2 wheeled)(requires hand over hand for L UE support grasp) Transfers: Sit to/from Stand Sit to Stand: Min assist, +2 physical assistance(once in standing requires mod to max +2 to maintain standing) Squat pivot transfers: Max assist, +2 physical assistance, +2 safety/equipment General transfer comment: increased time and effort, cueing for hand placement and sequencing, L lateral lean and L inattention  Ambulation/Gait Ambulation/Gait assistance: Mod assist, +2 physical assistance Gait Distance (Feet): 10 Feet(+ 6 ft.  Pt more fatigued from ADLs with OT so session limited gait training and focused more on balance in standing while he performed ADLs.  ) Assistive device: Rolling walker (2 wheeled)(hand over hand assistance to keep hand on RW on LUE.  ) Gait Pattern/deviations: Step-to pattern, Decreased stance time - left, Decreased weight shift to  right General Gait Details: Pt required step by step for sequencing and RW placement.  Pt required cues for upper trunk control and maintaining posture during gait training.   Gait velocity interpretation: <1.31 ft/sec, indicative of household ambulator   ADL: ADL Overall ADL's : Needs assistance/impaired Eating/Feeding: Set up, Supervision/ safety, Sitting Grooming: Wash/dry face, Oral care, Brushing hair, Minimal assistance, Standing Grooming Details (indicate cue type and reason): requires +2 to complete standing at sink, requires HOH assist to reach with L hand, assist to open toothpaste and cueing throughout for thoroughness  Upper Body Bathing: Sitting, Minimal assistance Lower Body Bathing: Maximal assistance, +2 for physical assistance, +2 for safety/equipment, Sit to/from stand Upper Body Dressing : Sitting, Moderate assistance Lower Body Dressing: Maximal assistance, +2 for physical assistance, +2 for safety/equipment, Sit to/from stand Toilet Transfer: Moderate assistance, +2 for physical assistance, Ambulation, RW Toilet Transfer Details (indicate cue type and reason): simulated to recliner  Functional mobility during ADLs: Moderate assistance, Maximal assistance, +2 for physical assistance, Rolling walker, Cueing for sequencing, Cueing for safety  General ADL Comments: patient with poor thoroughness of ADL tasks, L lateral lean   Cognition: Cognition Overall Cognitive Status: Impaired/Different from baseline Arousal/Alertness: Awake/alert Orientation Level: Oriented X4 Attention: Selective, Alternating Selective Attention: Appears intact Alternating Attention: Appears intact Memory: Appears intact Awareness: Impaired Awareness Impairment: Emergent impairment Problem Solving: Impaired Problem Solving Impairment: Functional complex, Verbal complex Safety/Judgment: Appears intact Cognition Arousal/Alertness: Awake/alert Behavior During Therapy: WFL for tasks  assessed/performed Overall Cognitive Status: Impaired/Different from baseline Area of Impairment: Problem solving, Awareness, Safety/judgement, Attention Current Attention Level: Sustained Memory: Decreased recall of precautions Following Commands: Follows one step commands with increased time Safety/Judgement: Decreased awareness of safety, Decreased awareness of deficits Awareness: Emergent Problem Solving: Requires verbal cues, Requires tactile cues, Difficulty sequencing General Comments: Pt presents with decreased attention and thoroughness with tasks, impulsivity increases with fatigue.    Physical Exam: Blood pressure (!) 142/86, pulse 62, temperature 97.7 F (36.5 C), temperature source Oral, resp. rate 20, height 5\' 7"  (1.702 m), weight (S) 74.9 kg, SpO2 98 %. Physical Exam   General: No acute distress Mood and affect are appropriate Heart: Regular rate and rhythm no rubs murmurs or extra sounds Lungs: Clear to auscultation, breathing unlabored, no rales or wheezes Abdomen: Positive bowel sounds, soft nontender to palpation, nondistended Extremities: No clubbing, cyanosis, or edema Skin: No evidence of breakdown, no evidence of rash Neurologic: Cranial nerves II through XII intact, motor strength is 5/5 in RIght  deltoid, bicep, tricep, grip, hip flexor, knee extensors, ankle dorsiflexor and plantar flexor 3= Left delt, bi , tri grip, 4/5 Lef tHF, KE, ADF Sensory exam normal sensation to light touch right upper and lower extremities Reduced in Left upper and lower ext patchy distribution Cerebellar exam normal finger to nose to finger as well as heel to shin in bilateral upper and lower extremities Musculoskeletal: Full range of motion in all 4 extremities. No joint swelling Lab Results Last 48 Hours        Results for orders placed or performed during the hospital encounter of 11/27/18 (from the past 48 hour(s))  Sodium     Status: None    Collection Time: 11/29/18  8:14 AM   Result Value Ref Range    Sodium 135 135 - 145 mmol/L      Comment: Performed at [facility], [place] [address] [place], [place] [address]  Basic metabolic panel     Status: Abnormal    Collection Time: 11/30/18  4:40 AM  Result Value Ref Range    Sodium 133 (L) 135 - 145 mmol/L    Potassium 3.8 3.5 - 5.1 mmol/L    Chloride 106 98 - 111 mmol/L    CO2 21 (L) 22 - 32 mmol/L    Glucose, Bld 88 70 - 99 mg/dL    BUN 14 8 - 23 mg/dL    Creatinine, Ser 1.11 0.61 - 1.24 mg/dL    Calcium 8.6 (L) 8.9 - 10.3 mg/dL    GFR calc non Af Amer >60 >60 mL/min    GFR calc Af Amer >60 >60 mL/min    Anion gap 6 5 - 15      Comment: Performed at [facility], [place] [address], [place], [place] [address]      Imaging Results (Last 48 hours)  No results found.           Medical Problem List and Plan: 1.  Left side weakness with dysarthria secondary to right basal ganglia ICH as well as  left subcortical CVA CIR PT, OT, SLP evals in am 2.  DVT Prophylaxis/Anticoagulation: SCDs. Monitor for any signs of DVT 3. Pain Management:  Tylenol as needed 4. Mood:  Provide emotional support 5. Neuropsych: This patient is capable of making decisions on his own behalf. 6. Skin/Wound Care:  Routine skin checks 7. Fluids/Electrolytes/Nutrition:  Routine in and out's with follow-up chemistries 8. Atrial fibrillation. ELIQUIS discontinued due to [place]. Cardiac rate controlled. Continue Tenormin 75 mg daily. Follow-up cardiology services Dr. Angelena Form as needed and await plan for possible cardioversion 9. Hyperlipidemia. Zocor 10. Rheumatoid arthritis. Patient on chronic prednisone as well as scheduled injections of Humira prior to admission. Will Discuss plan of care.     Post Admission Physician Evaluation: 1. Functional deficits secondary  to RIght basal ganglia ICH. 2. Patient admitted to receive collaborative, interdisciplinary care between the physiatrist, rehab nursing staff, and therapy  team. 3. Patient's level of medical complexity and substantial therapy needs in context of that medical necessity cannot be provided at a lesser intensity of care. 4. Patient has experienced substantial functional loss from his/her baseline..  Judging by the patient's diagnosis, physical exam, and functional history, the patient has potential for functional progress which will result in measurable gains while on inpatient rehab.  These gains will be of substantial and practical use upon discharge in facilitating mobility and self-care at the household level. 5. Physiatrist will provide 24 hour management of medical needs as well as oversight of the therapy plan/treatment and provide guidance as appropriate regarding the interaction of the two. 6. 24 hour rehab nursing will assist in the management of  bladder management, bowel management, safety, skin/wound care, disease management, medication administration, pain management and patient education  and help integrate therapy concepts, techniques,education, etc. 7. PT will assess and treat for:pre gait, gait training, endurance , safety, equipment, neuromuscular re education  .  Goals are: supervision. 8. OT will assess and treat for ADLs, Cognitive perceptual skills, Neuromuscular re education, safety, endurance, equipment  .  Goals are: supervision.  9. SLP will assess and treat for assess treat swallowing, attention, concentration, problem-solving medication management  .  Goals are: Supervision to mod I with awareness of higher-level cognitive deficits. 10. Case Management and Social Worker will assess and treat for psychological issues and discharge planning. 11. Team conference will be held weekly to assess progress toward goals and to determine barriers to discharge. 12.  Patient will receive at least 3 hours of therapy per day at least 5 days per week. 13. ELOS and Prognosis:  good, ELOS 14-17d   "I have personally performed a face to face  diagnostic evaluation of this patient.  Additionally, I have reviewed and concur with the physician assistant's documentation above."  Charlett Blake M.D. [facility] FAAPM&R (Sports Med, Neuromuscular Med) Diplomate [place] Board of [place], PA-C 12/01/2018

## 2018-12-01 NOTE — PMR Pre-admission (Signed)
PMR Admission Coordinator Pre-Admission Assessment  Patient: Barry Woods is an 73 y.o., male MRN: [id number] DOB: Jun 27, 1946 Height: _0  (170.2 cm) Weight: (S) 74.9 kg              Insurance Information HMO:     PPO: Yes      PCP:      IPA:      80/20:      OTHER:  PRIMARY: [employer] Medicare      Policy#: [id number]      Subscriber: Patient CM Name: Barry Woods      Phone#: [phone]     Fax#: NA Pre-Cert#: [id number]      Employer:  [employer] provided by Barry Woods on 12/01/18 for admit to CIR on 12/01/18. Pt is approved for 7 days from day of admit. Clinicals are due to [place] by 12/07/18 (f): [phone]; (p): [phone] Benefits:  Phone #: NA     Name: [employer]  Eff. Date: 11/23/18     Deduct: $0      Out of Pocket Max: $4,200 ($0 met)      Life Max: NA CIR: $250/day, up to $1,500/admission       SNF: $0/day for days 1-20, $178/day for days 21-100; 100 day limit Outpatient: per necessity     Co-Pay: $35/visit Home Health: per necessity, 100%      Co-Pay:  DME: 80%     Co-Pay: 20% Providers:  SECONDARY:       Policy#:       Subscriber:  CM Name:       Phone#:      Fax#:  Pre-Cert#:       Employer:  Benefits:  Phone #:      Name:  Eff. 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 Per chart review and family, patient lives alone independent prior to admission. Multilevel home with bedroom and bathroom upstairs. Local family works. Presented 11/27/2018 with left-sided weakness and slurred speech as well as a fall. Cranial CT scan reviewed showing right brain hemorrhage.Report of acute hemorrhage within the right lentiform nucleus measuring up to 3.9 cm. Associated edema and mass effect. Patient did receive Andexxa to reverse anticoagulation. Also noted mild hyponatremia 131 as well as creatinine 1.33. Neurosurgery Dr. Newman Pies consulted in regards to hemorrhage at 5 conservative care with latest follow-up MRI without change in right basal ganglia hematoma. There was noted a 4 mm acute infarction in the medial left subcortical white matter. No current plan for anticoagulation due to hemorrhage. Initially maintained  on Cleviprex for blood pressure control. Tolerating a mechanical soft diet. Therapy evaluations completed with recommendations of physical medicine rehabilitation consult. Patient was admitted for a comprehensive rehabilitation program on 12/01/18.  Complete NIHSS TOTAL: 7    Past Medical History  Past Medical History:  Diagnosis Date  . Arthritis   . Cerebral cavernoma   . Hyperlipidemia   . Hypertension   . Rheumatoid arthritis ([place])     Family History  family history includes CAD in his father; Colon cancer (age of onset: 75) in his mother; Hepatitis C in his father.  Prior Rehab/Hospitalizations:  Has the patient had major surgery during 100 days prior to admission? No  Current Medications   Current Facility-Administered Medications:  .  acetaminophen (TYLENOL) tablet 650 mg, 650 mg, Oral, Q4H PRN **OR** acetaminophen (TYLENOL) solution 650 mg, 650 mg, Per Tube, Q4H PRN **OR** acetaminophen (TYLENOL) suppository 650 mg, 650 mg, Rectal, Q4H  PRN, Amie Portland, MD .  atenolol (TENORMIN) tablet 75 mg, 75 mg, Oral, Daily, Biby, Sharon L, NP, 75 mg at 12/01/18 0807 .  [COMPLETED] labetalol (NORMODYNE,TRANDATE) injection 20 mg, 20 mg, Intravenous, Once, 20 mg at 11/27/18 1915 **AND** clevidipine (CLEVIPREX) infusion 0.5 mg/mL, 0-21 mg/hr, Intravenous, Continuous, Garvin Fila, MD, Stopped at 11/29/18 [phone] .  hydrocortisone 1 % ointment, , Topical, PRN, Vonzella Nipple, NP, 1 application at 32/95/18 1251 .  potassium chloride SA (K-DUR,KLOR-CON) CR tablet 20 mEq, 20 mEq, Oral, Daily, Biby, Sharon L, NP, 20 mEq at 12/01/18 0808 .  senna-docusate (Senokot-S) tablet 1 tablet, 1 tablet, Oral, BID, Amie Portland, MD, 1 tablet at 11/30/18 1014 .  simvastatin (ZOCOR) tablet 20 mg, 20 mg, Oral, QHS, Biby, Sharon L, NP, 20 mg at 11/30/18 2135  Patients Current Diet:  Diet Order            DIET DYS 3 Room service appropriate? Yes; Fluid consistency: Thin  Diet effective now              Precautions / Restrictions Precautions Precautions: Fall Precaution Comments: left inattention, left hemiparesis Restrictions Weight Bearing Restrictions: No   Has the patient had 2 or more falls or a fall with injury in the past year?No  Prior Activity Level Community (5-7x/wk): active PTA; retired Chief Technology Officer; yes drove PTA  Development worker, international aid / [place] Devices/Equipment: None Home Equipment: None  Prior Device Use: Indicate devices/aids used by the patient prior to current illness, exacerbation or injury? None of the above  Prior Functional Level Prior Function Level of Independence: Independent  Self Care: Did the patient need help bathing, dressing, using the toilet or eating?  Independent  Indoor Mobility: Did the patient need assistance with walking from room to room (with or without device)? Independent  Stairs: Did the patient need assistance with internal or external stairs (with or without  device)? Independent  Functional Cognition: Did the patient need help planning regular tasks such as shopping or remembering to take medications? Independent  Current Functional Level Cognition  Arousal/Alertness: Awake/alert Overall Cognitive Status: Impaired/Different from baseline Current Attention Level: Sustained Orientation Level: Oriented X4 Following Commands: Follows one step commands with increased time Safety/Judgement: Decreased awareness of safety, Decreased awareness of deficits General Comments: Pt presents with decreased attention and thoroughness with tasks, impulsivity increases with fatigue.  Attention: Selective, Alternating Selective Attention: Appears intact Alternating Attention: Appears intact Memory: Appears intact Awareness: Impaired Awareness Impairment: Emergent impairment Problem Solving: Impaired Problem Solving Impairment: Functional complex, Verbal complex Safety/Judgment:  Appears intact    Extremity Assessment (includes Sensation/Coordination)  Upper Extremity Assessment: Defer to OT evaluation LUE Deficits / Details: grossly 3-/5, able to use as a gross stabilizer LUE Sensation: decreased light touch, decreased proprioception LUE Coordination: decreased fine motor, decreased gross motor  Lower Extremity Assessment: RLE deficits/detail, LLE deficits/detail RLE Deficits / Details: 5/5 all myotomes LLE Deficits / Details: 3/5 knee flexion, hip flexion and quads    ADLs  Overall ADL's : Needs assistance/impaired Eating/Feeding: Set up, Supervision/ safety, Sitting Grooming: Wash/dry face, Oral care, Brushing hair, Minimal assistance, Standing Grooming Details (indicate cue type and reason): requires +2 to complete standing at sink, requires HOH assist to reach with L hand, assist to open toothpaste and cueing throughout for thoroughness  Upper Body Bathing: Sitting, Minimal assistance Lower Body Bathing: Maximal assistance, +2 for physical  assistance, +2 for safety/equipment, Sit to/from stand Upper Body Dressing : Sitting, Moderate assistance Lower Body Dressing: Maximal assistance, +2 for physical assistance, +2 for safety/equipment, Sit to/from stand Toilet Transfer: Moderate assistance, +2 for physical assistance, Ambulation, RW Toilet Transfer Details (indicate cue type and reason): simulated to recliner  Functional mobility during ADLs: Moderate assistance, Maximal assistance, +2 for physical assistance, Rolling walker, Cueing for sequencing, Cueing for safety General ADL Comments: patient with poor thoroughness of ADL tasks, L lateral lean    Mobility  Overal bed mobility: Needs Assistance Bed Mobility: Supine to Sit Supine to sit: Mod assist, +2 for physical assistance General bed mobility comments: Pt seated in recliner on arrival.      Transfers  Overall transfer level: Needs assistance Equipment used: Rolling walker (2 wheeled)(Required hand over hand assistance for LUE grip on RW.) Transfers: Sit to/from Stand Sit to Stand: Min assist, +2 physical assistance Squat pivot transfers: Max assist, +2 physical assistance, +2 safety/equipment General transfer comment: Cues for hand placement and hand over hand on LUE to engage L arm in pushing from seated surface.      Ambulation / Gait / Stairs / Wheelchair Mobility  Ambulation/Gait Ambulation/Gait assistance: Mod assist, +2 safety/equipment Gait Distance (Feet): 60 Feet Assistive device: Rolling walker (2 wheeled)(hand over hand assistance to keep L hand on RW.  ) Gait Pattern/deviations: Step-to pattern, Decreased stance time - left, Decreased weight shift to right, Decreased step length - left, Decreased dorsiflexion - left General Gait Details: Pt with improved cadence but when fatigue he tends to drap L foot and required cues to increase stride and improve foot clearance.   Gait velocity interpretation: <1.31 ft/sec, indicative of household ambulator     Posture / Balance Dynamic Sitting Balance Sitting balance - Comments: anterior and L lateral lean with verbal cueing to correct with unsupported sitting  Balance Overall balance assessment: Needs assistance Sitting-balance support: Feet supported, Bilateral upper extremity supported Sitting balance-Leahy Scale: Fair Sitting balance - Comments: anterior and L lateral lean with verbal cueing to correct with unsupported sitting  Postural control: Left lateral lean Standing balance support: During functional activity, Single extremity supported Standing balance-Leahy Scale: Poor Standing balance comment: Balance remains poor continues to require assistance to weight shift but much improved from last session.      Special needs/care consideration BiPAP/CPAP: no CPM: no Continuous Drip IV: no Dialysis: no        Days: no Life Vest: no Oxygen: no Special Bed: no Trach Size: no Wound Vac (area): no      Location: no Skin: abrasion to left arm, open arm left skin tear under elbow, moisture associated  skin damage to proximal coccyx                             Bowel mgmt: continent, last BM: 11/30/18 Bladder mgmt: continent Diabetic mgmt: no     Previous Home Environment Living Arrangements: Alone  Lives With: Spouse Available Help at Discharge: Family Type of Home: House Home Layout: Multi-level, Bed/bath upstairs, 1/2 bath on main level Alternate Level Stairs-Number of Steps: 2 flights to bedroom Home Access: Level entry [place]: No  Discharge Living Setting Plans for Discharge Living Setting: Patient's home, Other (Comment)(plans to have hired help and family to assist as needed) Type of Home at Discharge: Other (Comment)(townhome) Discharge Home Layout: Multi-level, Able to live on main level with bedroom/bathroom(1/2 bath on first floor. ) Alternate Level Stairs-Rails: None(unknown) Alternate Level Stairs-Number of Steps: 2 flights to bedroom; able to bring bedroom  downstairs to first floor Discharge Home Access: Stairs to enter Entrance Stairs-Rails: None Entrance Stairs-Number of Steps: unknown Discharge Bathroom Shower/Tub: Tub/shower unit, Walk-in shower Discharge Bathroom Toilet: Standard Discharge Bathroom Accessibility: Yes How Accessible: Accessible via walker Does the patient have any problems obtaining your medications?: No  Social/Family/Support Systems Patient Roles: Other (Comment)(father to older children; active, exercised PTA) Contact Information: [name] (daugther): [phone]; Levada Dy (daughter): [phone]; Bethena Roys (sister): [phone] Anticipated Caregiver: daugthers, sister, hired help through agency Anticipated Caregiver's Contact Information: see above Ability/Limitations of Caregiver: Min A Caregiver Availability: 24/7 Discharge Plan Discussed with Primary Caregiver: Yes(discussed with [name] and sister) Is Caregiver In Agreement with Plan?: Yes Does Caregiver/Family have Issues with Lodging/Transportation while Pt is in Rehab?: No   Goals/Additional Needs Patient/Family Goal for Rehab: PT/OT: Supervision/Min A; SLP: Mod I Expected length of stay: 14-18 days Cultural Considerations: Christian Dietary Needs: DYS 3, thin liquids Equipment Needs: TBD Pt/Family Agrees to Admission and willing to participate: Yes Program Orientation Provided & Reviewed with Pt/Caregiver Including Roles  & Responsibilities: Yes(with pt, daugther and sister)  Barriers to Discharge: Home environment access/layout  Barriers to Discharge Comments: multilevel home; will need to negotiate stairs to enterl stairs to get to full bath.    Decrease burden of Care through IP rehab admission: NA   Possible need for SNF placement upon discharge: Not anticipated; pt has good family support with family already making home modifications to enable a safe return home as well as looking into hiring home support personnel.    Patient Condition: This  patient's medical and functional status has changed since the consult dated 11/29/18 in which the Rehabilitation Physician determined and documented that the patient was potentially appropriate for intensive rehabilitative care in an inpatient rehabilitation facility. Issues have been addressed and update has been discussed with Dr. Letta Pate  and patient now appropriate for inpatient rehabilitation. Issues with caregiver support and home disposition have been addressed with family making necessary arrangements at this time. Pt will return to his home with bedroom moved to first floor and utilize half bath on main level; family is getting hired assist in place to supplement their own caregiver support plan. Will admit to inpatient rehab today.   Preadmission Screen Completed By:  Jhonnie Garner, 12/01/2018 4:05 PM ______________________________________________________________________   Discussed status with Dr. Letta Pate on 12/01/18 at 4:05PM and received telephone approval for admission today.  Admission Coordinator:  Jhonnie Garner, time 4:05PM/Date 12/01/18.

## 2018-12-01 NOTE — Care Management Note (Signed)
Case Management Note  Patient Details  Name: Barry Woods MRN: [id number] Date of Birth: 1946-06-12  Subjective/Objective:                    Action/Plan: Pt discharging to CIR today. CM signing off.  Expected Discharge Date:  12/01/18               Expected Discharge Plan:  [place]  In-House Referral:     Discharge planning Services  CM Consult  Post Acute Care Choice:    Choice offered to:     DME Arranged:    DME Agency:     HH Arranged:    HH Agency:     Status of Service:  Completed, signed off  If discussed at [employer], dates discussed:    Additional Comments:  Pollie Friar, RN 12/01/2018, 3:39 PM

## 2018-12-01 NOTE — Progress Notes (Signed)
Patient ID: Barry Woods, male   DOB: 1946/09/28, 73 y.o.   MRN: [id number] Patient and family arrived from [facility] with RN, NT, and patient belongings. Patient and family oriented to room, nurse call system, fall prevention plan, rehab safety plan, rehab schedule, health resource notebook, bed alarm, and use of safety belt alarm with verbal understanding. Patient seated in recliner eating dinner with assistance provided by daughter. Patient has no complaints of pain. Call bell at patient's side.

## 2018-12-01 NOTE — Progress Notes (Signed)
PMR Admission Coordinator Pre-Admission Assessment  Patient: Barry Woods is an 73 y.o., male MRN: [id number] DOB: 1946/02/02 Height: 5' 7"  (170.2 cm) Weight: (S) 74.9 kg                                                                                                                                                  Insurance Information HMO:     PPO: Yes      PCP:      IPA:      80/20:      OTHER:  PRIMARY: [employer] Medicare      Policy#: [id number]      Subscriber: Patient CM Name: Barry Woods      Phone#: [phone]     Fax#: NA Pre-Cert#: [id number]      Employer:  [employer] provided by Barry Woods on 12/01/18 for admit to CIR on 12/01/18. Pt is approved for 7 days from day of admit. Clinicals are due to [place] by 12/07/18 (f): [phone]; (p): [phone] Benefits:  Phone #: NA     Name: [employer] Portal  Eff. Date: 11/23/18     Deduct: $0      Out of Pocket Max: $4,200 ($0 met)      Life Max: NA CIR: $250/day, up to $1,500/admission       SNF: $0/day for days 1-20, $178/day for days 21-100; 100 day limit Outpatient: per necessity     Co-Pay: $35/visit Home Health: per necessity, 100%      Co-Pay:  DME: 80%     Co-Pay: 20% Providers:  SECONDARY:       Policy#:       Subscriber:  CM Name:       Phone#:      Fax#:  Pre-Cert#:       Employer:  Benefits:  Phone #:      Name:  Eff. Date:      Deduct:       Out of Pocket Max:       Life Max:  CIR:       SNF:  Outpatient:     Co-Pay:  Home Health:       Co-Pay:  DME:      Co-Pay:   Medicaid Application Date:       Case Manager:  Disability Application Date:       Case Worker:   Emergency Contact Information         Contact Information    Name Relation Home Work Mobile   Barry Woods Daughter [phone]     Barry Woods Daughter [phone]       Current Medical History  Patient Admitting Diagnosis: right basal ganglia hematomawithmedial left subcortical white matterinfarct  History of  Present Illness: Barry Woods 73 year old right-handed male with history of hyperlipidemia, hypertension, rheumatoid arthritis maintained on chronic prednisone and receives scheduled  injections of Humira, chronic atrial fibrillation recently placed on Eliquis. Per chart review and family, patient lives alone independent prior to admission. Multilevel home with bedroom and bathroom upstairs. Local family works. Presented 11/27/2018 with left-sided weakness and slurred speech as well as a fall. Cranial CT scan reviewed showing right brain hemorrhage.Report of acute hemorrhage within the right lentiform nucleus measuring up to 3.9 cm. Associated edema and mass effect. Patient did receive Andexxa to reverse anticoagulation. Also noted mild hyponatremia 131 as well as creatinine 1.33. Neurosurgery Dr. Newman Woods consulted in regards to hemorrhage at 5 conservative care with latest follow-up MRI without change in right basal ganglia hematoma. There was noted a 4 mm acute infarction in the medial left subcortical white matter. No current plan for anticoagulation due to hemorrhage. Initially maintained on Cleviprex for blood pressure control. Tolerating a mechanical soft diet. Therapy evaluations completed with recommendations of physical medicine rehabilitation consult. Patient was admitted for a comprehensive rehabilitation program on 12/01/18.  Complete NIHSS TOTAL: 7  Past Medical History      Past Medical History:  Diagnosis Date  . Arthritis   . Cerebral cavernoma   . Hyperlipidemia   . Hypertension   . Rheumatoid arthritis ([place])     Family History  family history includes CAD in his father; Colon cancer (age of onset: 40) in his mother; Hepatitis C in his father.  Prior Rehab/Hospitalizations:  Has the patient had major surgery during 100 days prior to admission? No  Current Medications   Current Facility-Administered Medications:  .  acetaminophen (TYLENOL) tablet 650 mg,  650 mg, Oral, Q4H PRN **OR** acetaminophen (TYLENOL) solution 650 mg, 650 mg, Per Tube, Q4H PRN **OR** acetaminophen (TYLENOL) suppository 650 mg, 650 mg, Rectal, Q4H PRN, Barry Portland, MD .  atenolol (TENORMIN) tablet 75 mg, 75 mg, Oral, Daily, Biby, Barry Woods, 75 mg at 12/01/18 0807 .  [COMPLETED] labetalol (NORMODYNE,TRANDATE) injection 20 mg, 20 mg, Intravenous, Once, 20 mg at 11/27/18 1915 **AND** clevidipine (CLEVIPREX) infusion 0.5 mg/mL, 0-21 mg/hr, Intravenous, Continuous, Barry Fila, MD, Stopped at 11/29/18 [phone] .  hydrocortisone 1 % ointment, , Topical, PRN, Barry Nipple, Woods, 1 application at 96/04/54 1251 .  potassium chloride SA (K-DUR,KLOR-CON) CR tablet 20 mEq, 20 mEq, Oral, Daily, Biby, Barry Woods, 20 mEq at 12/01/18 0808 .  senna-docusate (Senokot-S) tablet 1 tablet, 1 tablet, Oral, BID, Barry Portland, MD, 1 tablet at 11/30/18 1014 .  simvastatin (ZOCOR) tablet 20 mg, 20 mg, Oral, QHS, Biby, Barry Woods, 20 mg at 11/30/18 2135  Patients Current Diet:  Diet Order                  DIET DYS 3 Room service appropriate? Yes; Fluid consistency: Thin  Diet effective now               Precautions / Restrictions Precautions Precautions: Fall Precaution Comments: left inattention, left hemiparesis Restrictions Weight Bearing Restrictions: No   Has the patient had 2 or more falls or a fall with injury in the past year?No  Prior Activity Level Community (5-7x/wk): active PTA; retired Chief Technology Officer; yes drove PTA  Development worker, international aid / [place] Devices/Equipment: None Home Equipment: None  Prior Device Use: Indicate devices/aids used by the patient prior to current illness, exacerbation or injury? None of the above  Prior Functional Level Prior Function Level of Independence: Independent  Self Care: Did the patient need help bathing, dressing, using the toilet or eating?  Independent  Indoor Mobility: Did the  patient need assistance with walking from room to room (with or without device)? Independent  Stairs: Did the patient need assistance with internal or external stairs (with or without device)? Independent  Functional Cognition: Did the patient need help planning regular tasks such as shopping or remembering to take medications? Independent  Current Functional Level Cognition  Arousal/Alertness: Awake/alert Overall Cognitive Status: Impaired/Different from baseline Current Attention Level: Sustained Orientation Level: Oriented X4 Following Commands: Follows one step commands with increased time Safety/Judgement: Decreased awareness of safety, Decreased awareness of deficits General Comments: Pt presents with decreased attention and thoroughness with tasks, impulsivity increases with fatigue.  Attention: Selective, Alternating Selective Attention: Appears intact Alternating Attention: Appears intact Memory: Appears intact Awareness: Impaired Awareness Impairment: Emergent impairment Problem Solving: Impaired Problem Solving Impairment: Functional complex, Verbal complex Safety/Judgment: Appears intact    Extremity Assessment (includes Sensation/Coordination)  Upper Extremity Assessment: Defer to OT evaluation LUE Deficits / Details: grossly 3-/5, able to use as a gross stabilizer LUE Sensation: decreased light touch, decreased proprioception LUE Coordination: decreased fine motor, decreased gross motor  Lower Extremity Assessment: RLE deficits/detail, LLE deficits/detail RLE Deficits / Details: 5/5 all myotomes LLE Deficits / Details: 3/5 knee flexion, hip flexion and quads    ADLs  Overall ADL's : Needs assistance/impaired Eating/Feeding: Set up, Supervision/ safety, Sitting Grooming: Wash/dry face, Oral care, Brushing hair, Minimal assistance, Standing Grooming Details (indicate cue type and reason): requires +2 to complete standing at sink, requires HOH assist to reach  with Woods hand, assist to open toothpaste and cueing throughout for thoroughness  Upper Body Bathing: Sitting, Minimal assistance Lower Body Bathing: Maximal assistance, +2 for physical assistance, +2 for safety/equipment, Sit to/from stand Upper Body Dressing : Sitting, Moderate assistance Lower Body Dressing: Maximal assistance, +2 for physical assistance, +2 for safety/equipment, Sit to/from stand Toilet Transfer: Moderate assistance, +2 for physical assistance, Ambulation, RW Toilet Transfer Details (indicate cue type and reason): simulated to recliner  Functional mobility during ADLs: Moderate assistance, Maximal assistance, +2 for physical assistance, Rolling walker, Cueing for sequencing, Cueing for safety General ADL Comments: patient with poor thoroughness of ADL tasks, Woods lateral lean    Mobility  Overal bed mobility: Needs Assistance Bed Mobility: Supine to Sit Supine to sit: Mod assist, +2 for physical assistance General bed mobility comments: Pt seated in recliner on arrival.      Transfers  Overall transfer level: Needs assistance Equipment used: Rolling walker (2 wheeled)(Required hand over hand assistance for LUE grip on RW.) Transfers: Sit to/from Stand Sit to Stand: Min assist, +2 physical assistance Squat pivot transfers: Max assist, +2 physical assistance, +2 safety/equipment General transfer comment: Cues for hand placement and hand over hand on LUE to engage Woods arm in pushing from seated surface.      Ambulation / Gait / Stairs / Wheelchair Mobility  Ambulation/Gait Ambulation/Gait assistance: Mod assist, +2 safety/equipment Gait Distance (Feet): 60 Feet Assistive device: Rolling walker (2 wheeled)(hand over hand assistance to keep Woods hand on RW.  ) Gait Pattern/deviations: Step-to pattern, Decreased stance time - left, Decreased weight shift to right, Decreased step length - left, Decreased dorsiflexion - left General Gait Details: Pt with improved cadence but  when fatigue he tends to drap Woods foot and required cues to increase stride and improve foot clearance.   Gait velocity interpretation: <1.31 ft/sec, indicative of household ambulator    Posture / Balance Dynamic Sitting Balance Sitting balance - Comments: anterior and Woods lateral lean  with verbal cueing to correct with unsupported sitting  Balance Overall balance assessment: Needs assistance Sitting-balance support: Feet supported, Bilateral upper extremity supported Sitting balance-Leahy Scale: Fair Sitting balance - Comments: anterior and Woods lateral lean with verbal cueing to correct with unsupported sitting  Postural control: Left lateral lean Standing balance support: During functional activity, Single extremity supported Standing balance-Leahy Scale: Poor Standing balance comment: Balance remains poor continues to require assistance to weight shift but much improved from last session.      Special needs/care consideration BiPAP/CPAP: no CPM: no Continuous Drip IV: no Dialysis: no        Days: no Life Vest: no Oxygen: no Special Bed: no Trach Size: no Wound Vac (area): no      Location: no Skin: abrasion to left arm, open arm left skin tear under elbow, moisture associated skin damage to proximal coccyx                             Bowel mgmt: continent, last BM: 11/30/18 Bladder mgmt: continent Diabetic mgmt: no     Previous Home Environment Living Arrangements: Alone  Lives With: Spouse Available Help at Discharge: Family Type of Home: House Home Layout: Multi-level, Bed/bath upstairs, 1/2 bath on main level Alternate Level Stairs-Number of Steps: 2 flights to bedroom Home Access: Level entry [place]: No  Discharge Living Setting Plans for Discharge Living Setting: Patient's home, Other (Comment)(plans to have hired help and family to assist as needed) Type of Home at Discharge: Other (Comment)(townhome) Discharge Home Layout: Multi-level, Able to live on  main level with bedroom/bathroom(1/2 bath on first floor. ) Alternate Level Stairs-Rails: None(unknown) Alternate Level Stairs-Number of Steps: 2 flights to bedroom; able to bring bedroom downstairs to first floor Discharge Home Access: Stairs to enter Entrance Stairs-Rails: None Entrance Stairs-Number of Steps: unknown Discharge Bathroom Shower/Tub: Tub/shower unit, Walk-in shower Discharge Bathroom Toilet: Standard Discharge Bathroom Accessibility: Yes How Accessible: Accessible via walker Does the patient have any problems obtaining your medications?: No  Social/Family/Support Systems Patient Roles: Other (Comment)(father to older children; active, exercised PTA) Contact Information: [name] (daugther): [phone]; Levada Dy (daughter): [phone]; Bethena Roys (sister): [phone] Anticipated Caregiver: daugthers, sister, hired help through agency Anticipated Caregiver's Contact Information: see above Ability/Limitations of Caregiver: Min A Caregiver Availability: 24/7 Discharge Plan Discussed with Primary Caregiver: Yes(discussed with [name] and sister) Is Caregiver In Agreement with Plan?: Yes Does Caregiver/Family have Issues with Lodging/Transportation while Pt is in Rehab?: No   Goals/Additional Needs Patient/Family Goal for Rehab: PT/OT: Supervision/Min A; SLP: Mod I Expected length of stay: 14-18 days Cultural Considerations: Christian Dietary Needs: DYS 3, thin liquids Equipment Needs: TBD Pt/Family Agrees to Admission and willing to participate: Yes Program Orientation Provided & Reviewed with Pt/Caregiver Including Roles  & Responsibilities: Yes(with pt, daugther and sister)  Barriers to Discharge: Home environment access/layout  Barriers to Discharge Comments: multilevel home; will need to negotiate stairs to enterl stairs to get to full bath.    Decrease burden of Care through IP rehab admission: NA   Possible need for SNF placement upon discharge: Not  anticipated; pt has good family support with family already making home modifications to enable a safe return home as well as looking into hiring home support personnel.    Patient Condition: This patient's medical and functional status has changed since the consult dated 11/29/18 in which the Rehabilitation Physician determined and documented that the patient was potentially appropriate for intensive rehabilitative care  in an inpatient rehabilitation facility. Issues have been addressed and update has been discussed with Dr. Letta Pate  and patient now appropriate for inpatient rehabilitation. Issues with caregiver support and home disposition have been addressed with family making necessary arrangements at this time. Pt will return to his home with bedroom moved to first floor and utilize half bath on main level; family is getting hired assist in place to supplement their own caregiver support plan. Will admit to inpatient rehab today.   Preadmission Screen Completed By:  Jhonnie Garner, 12/01/2018 4:05 PM ______________________________________________________________________   Discussed status with Dr. Letta Pate on 12/01/18 at 4:05PM and received telephone approval for admission today.  Admission Coordinator:  Jhonnie Garner, time 4:05PM/Date 12/01/18.            Cosigned by: Charlett Blake, MD at 12/01/2018 4:10 PM  Revision History

## 2018-12-01 NOTE — Discharge Summary (Addendum)
Stroke Discharge Summary  Patient ID: Barry Woods   MRN: [id number]      DOB: 1945/12/22  Date of Admission: 11/27/2018 Date of Discharge: 12/01/2018  Attending Physician:  Garvin Fila, MD, Stroke MD Consultant(s):   rehabilitation medicine Dr. Posey Pronto Patient's PCP:  Dorothyann Peng, NP  Discharge Diagnoses:Rt basal ganglia intraparenchymal hemorrhage on eliquis Active Problems:   ICH (intracerebral hemorrhage) ([place])   Cerebral cavernoma   Hemorrhagic stroke ([place])   Hyponatremia   Dyslipidemia   Tachypnea   AKI (acute kidney injury) ([place]) Cytotoxic cerebral edema Cavernomas Past Medical History:  Diagnosis Date  . Arthritis   . Cerebral cavernoma   . Hyperlipidemia   . Hypertension   . Rheumatoid arthritis [facility])    Past Surgical History:  Procedure Laterality Date  . COLONOSCOPY    . LIPOMA EXCISION    . PALATE / UVULA BIOPSY / EXCISION    . TONSILLECTOMY     age 55    Medications to be continued on Rehab Allergies as of 12/01/2018      Reactions   Atorvastatin Hives   Cetirizine Hcl Hives      Medication List    STOP taking these medications   apixaban 5 MG Tabs tablet Commonly known as:  ELIQUIS   furosemide 40 MG tablet Commonly known as:  LASIX   predniSONE 10 MG tablet Commonly known as:  DELTASONE     TAKE these medications   atenolol 25 MG tablet Commonly known as:  TENORMIN Take 3 tablets (75 mg total) by mouth daily. Start taking on:  December 02, 2018 What changed:  medication strength   HUMIRA 40 MG/0.8ML Pskt Generic drug:  Adalimumab Inject 40 mg into the skin See admin instructions. Every other week   potassium chloride SA 20 MEQ tablet Commonly known as:  K-DUR,KLOR-CON Take 1 tablet (20 mEq total) by mouth daily. Start taking on:  December 02, 2018   simvastatin 20 MG tablet Commonly known as:  ZOCOR Take 1 tablet (20 mg total) by mouth at bedtime.       LABORATORY STUDIES CBC    Component Value Date/Time   WBC  3.9 (L) 11/27/2018 1905   RBC 4.55 11/27/2018 1905   HGB 15.0 11/27/2018 1914   HGB 14.3 11/01/2018 1046   HCT 44.0 11/27/2018 1914   HCT 42.2 11/01/2018 1046   PLT  11/27/2018 1905    PLATELET CLUMPS NOTED ON SMEAR, COUNT APPEARS DECREASED   PLT 104 (L) 11/01/2018 1046   MCV 95.8 11/27/2018 1905   MCV 95 11/01/2018 1046   MCH 31.0 11/27/2018 1905   MCHC 32.3 11/27/2018 1905   RDW 13.4 11/27/2018 1905   RDW 12.6 11/01/2018 1046   LYMPHSABS 1.4 11/27/2018 1905   MONOABS 0.7 11/27/2018 1905   EOSABS 0.1 11/27/2018 1905   BASOSABS 0.0 11/27/2018 1905   CMP    Component Value Date/Time   NA 133 (L) 11/30/2018 0440   NA 138 11/09/2018 1131   K 3.8 11/30/2018 0440   CL 106 11/30/2018 0440   CO2 21 (L) 11/30/2018 0440   GLUCOSE 88 11/30/2018 0440   GLUCOSE 90 09/28/2006 0957   BUN 14 11/30/2018 0440   BUN 15 11/09/2018 1131   CREATININE 1.11 11/30/2018 0440   CALCIUM 8.6 (L) 11/30/2018 0440   PROT 8.4 (H) 11/27/2018 1905   ALBUMIN 3.4 (L) 11/27/2018 1905   AST 105 (H) 11/27/2018 1905   ALT 82 (H) 11/27/2018 1905  ALKPHOS 48 11/27/2018 1905   BILITOT 1.3 (H) 11/27/2018 1905   GFRNONAA >60 11/30/2018 0440   GFRAA >60 11/30/2018 0440   COAGS Lab Results  Component Value Date   INR 1.44 11/27/2018   Lipid Panel    Component Value Date/Time   CHOL 137 11/29/2018 0430   TRIG 94 11/29/2018 0430   TRIG 94 11/29/2018 0430   TRIG 150 (H) 09/28/2006 0957   HDL 42 11/29/2018 0430   CHOLHDL 3.3 11/29/2018 0430   VLDL 19 11/29/2018 0430   LDLCALC 76 11/29/2018 0430   HgbA1C No results found for: HGBA1C Urinalysis    Component Value Date/Time   COLORURINE yellow 06/13/2010 1147   APPEARANCEUR Clear 06/13/2010 1147   LABSPEC 1.020 06/13/2010 1147   PHURINE 6.0 06/13/2010 1147   HGBUR moderate 06/13/2010 1147   BILIRUBINUR N 03/09/2018 1219   PROTEINUR N 03/09/2018 1219   UROBILINOGEN 0.2 03/09/2018 1219   UROBILINOGEN 0.2 06/13/2010 1147   NITRITE N 03/09/2018 1219    NITRITE negative 06/13/2010 1147   LEUKOCYTESUR Negative 03/09/2018 1219   Urine Drug Screen No results found for: LABOPIA, COCAINSCRNUR, LABBENZ, AMPHETMU, THCU, LABBARB  Alcohol Level No results found for: [facility]   SIGNIFICANT DIAGNOSTIC STUDIES  MRI/MRA 11/28/2018: MRI head: Right basal ganglia hematoma as seen previously without apparent enlargement. Surrounding edema. Mild mass effect with right-to-left shift of 2-3 mm. No sign of underlying tumor or high flow vascular malformation.  Chronic small-vessel ischemic changes elsewhere within the brain. Other foci of hemosiderin deposition consistent with old prior hemorrhage. The tendency to hemorrhage within the brain suggest the possibility of amyloid angiopathy.  4 mm acute infarction in the medial left subcortical white matter. Moderate chronic small-vessel ischemic changes elsewhere throughout the brain.  [place] 11/28/2018: 1. No significant interval change in size and appearance of intraparenchymal hemorrhage centered at the right basal ganglia, with similar localized edema and regional mass effect. Associated 2 mm right-to-left midline shift unchanged. 2. Otherwise stable appearance of the brain with no other new acute intracranial abnormality identified     [facility] 11/27/2018 repeat 1. Mild increase in size of hemorrhage within the right lentiform nucleus measuring up to 4.4 cm, 21 cc. Previously 3.9 cm and 17 cc. 2. Mildly increased associated mass effect with increased effacement of right lateral ventricle and 2 mm right-to-left midline shift.  Home 11/27/2018: 1. Acute hemorrhage within the right lentiform nucleus measuring up to 3.9 cm, 17 cc. 2. Associated edema and mass effect results in partial effacement of the right lateral ventricle. No midline shift or herniation. 3. ASPECTS is not applicable. 4. Stable background of chronic microvascular ischemic changes and volume loss of the brain. Left frontal cavernoma  again noted.    HISTORY OF PRESENT ILLNESS Barry Woods is a 73 y.o. male past medical history of chronic atrial fibrillation on Eliquis, hypertension, hyperlipidemia, rheumatoid arthritis, was in usual state of health till about 6:15 PM-or 6:18 PM to be precise as reported by him to EMS when he started noticing that his legs dragging on the left.  EMS was called.  Initial assessment was done by an EMS team onsite that found him to be LVO positive on the screen, he was put in the truck transported where they had a rendezvous with another EMS team who brought him in. He continued to be plegic on the left arm for them and paretic on the left leg along with left facial droop and slurred speech. Code stroke was activated to  [facility].  He was seen at the bridge in the ER. Initial examination was less concerning for an LVO but more concerning for a small vessel like stroke and ultimately was found to have a bleed in the basal ganglia on the right.  Of note  - is a patient of Dr Kathyrn Sheriff for cavernomas. MRI from 5/29 showed cavernomas in anterior left frontal lobe and right occipital lesion. Was told to come for repeat CT sometime in January.   DISCHARGE EXAM Blood pressure 127/84, pulse 63, temperature 98.1 F (36.7 C), temperature source Oral, resp. rate 16, height 5\' 7"  (1.702 m), weight (S) 74.9 kg, SpO2 98 %.   PHYSICAL EXAM per Dr. Leonie Man [place] elderly Caucasian male not in distress. . Afebrile. Head is nontraumatic. Neck is supple without bruit.  Cardiac exam no murmur or gallop. Lungs are clear to auscultation. Distal pulses are well felt. Neurological Exam :  Awake alert oriented 3 with normal speech and language function. No aphasia or dysarthria. Extraocular movements are full range without nystagmus. Blinks to threat bilaterally but suspect partial left hemianopsia. Fundi not visualized. Mild left lower facial weakness. Tongue midline. Motor system exam  shows mild left hemiparesis 4/5 with weakness of left grip and intrin Orbits right over left upper extremity. Mild weakness of left hip flexors ankle dorsiflexors only. Sensation appears preserved bilaterally. Plantars are downgoing.  Gait not tested.  Discharge Diet   Diet Order            DIET DYS 3 Room service appropriate? Yes; Fluid consistency: Thin  Diet effective now             liquids  DISCHARGE PLAN  Disposition:  Transfer to [facility] for ongoing PT, OT and ST  No antithrombotic for secondary stroke prevention. Patient was on eliquid, but d/t ICH it has been held.   Recommend ongoing risk factor control by Primary Care Physician at time of discharge from inpatient rehabilitation.  Follow-up Nafziger, Tommi Rumps, NP in 2 weeks following discharge from rehab.  Follow-up in [facility] in 4 weeks following discharge from rehab, office to schedule an appointment.   35 minutes were spent preparing discharge.  Laurey Morale, MSN, NP-C [facility] [phone] I have personally obtained history,examined this patient, reviewed notes, independently viewed imaging studies, participated in medical decision making and plan of care.ROS completed by me personally and pertinent positives fully documented  I have made any additions or clarifications directly to the above note. Agree with note above.    Antony Contras, MD Medical Director [facility] Pager: [phone] 12/01/2018 4:10 PM

## 2018-12-01 NOTE — Progress Notes (Addendum)
STROKE TEAM PROGRESS NOTE   INTERVAL HISTORY Patient awake, alert  Sitting up in chair eating. Family at  bedside.  Once insurance approval, patient to go to  Rehab.  Vitals:   11/30/18 2319 12/01/18 0353 12/01/18 0758 12/01/18 1232  BP: (!) 142/86  (!) 158/103 127/84  Pulse: 62  60 63  Resp: 20  20 16   Temp: 98.4 F (36.9 C) 97.7 F (36.5 C) 98.1 F (36.7 C) 98.1 F (36.7 C)  TempSrc: Oral Oral Oral Oral  SpO2: 98%  97% 98%  Weight:      Height:       Lipid Panel:     Component Value Date/Time   CHOL 137 11/29/2018 0430   TRIG 94 11/29/2018 0430   TRIG 94 11/29/2018 0430   TRIG 150 (H) 09/28/2006 0957   HDL 42 11/29/2018 0430   CHOLHDL 3.3 11/29/2018 0430   VLDL 19 11/29/2018 0430   LDLCALC 76 11/29/2018 0430   IMAGING No results found.   PHYSICAL EXAM    Neurological Exam :    ASSESSMENT/PLAN [name] is a 73 y.o. male with history of AF on eliquis, RA, HTN, HLD, cerebral cavernomas presenting with L sided weakness.   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  Small vessel disease.  Left frontal cavernoma  CT head 1/5 2033 mild increase in size of hemorrhage up to 21 cc, previously 17 cc.  Mildly increased mass-effect and effacement of right lateral ventricle with 2 mm right to left shift.  CT head 1/6 0101 no significant change in size of hemorrhage or midline shift.  MRI / MRA head R basal ganglia hemorrhage with surrounding edema.  2 to 3 mm right to left shift.  No underlying tumor or high flow vascular malformation.  Chronic small vessel disease.  Foci of hemosiderin consistent with old prior hemorrhage.  Suggest amyloid  angiopathy.  4 mm infarct medial left subcortical white matter.  LDL 76  Mg%  SCDs for VTE prophylaxis  Eliquis (apixaban) daily prior to admission, now on No antithrombotic given ICH  Therapy recommendations:  CIR. Admissions coordinator following for home care plans  Disposition:  pending (retired, lives alone in a 3-story town home)  Cerebral Edema, resolved Induced hypernatremia  Pt awake and alert  Treated w/ 3% on ICU via PIV  Na 133 today   Atrial Fibrillation  Home anticoagulation:  Eliquis (apixaban) daily   Eliquis reversed w/ Andexxa  Planned cardioversion next week - [place] - on hold  No longer a long-term AC candidate  Consider low dose aspirin 1/9  Hypertension  BP as high as 149/86  Now on Cleviprex  SBP goal < 140 x 24h  Home meds:  Atenolol 75 mg daily   Resume home meds . Long-term BP goal normotensive  Hyperlipidemia  Home meds:  zocor 20  Resume in hospital once able to swallow  LDL pending   Hold statin at this time  Other Stroke Risk Factors  Advanced age  ETOH use, advised to drink no more than 2 drink(s) a day  Cerebral Cavernomas, followed by Dr. Kathyrn Sheriff  Other Active Problems  RA on scheduled injections Humira, plan administration here on  Fri    CKD,  Cr 1.4  Essential tremor, on atenolol  On lasix and potassium at home. Will resume K now, consider lasix in am  Hospital day # [place], MSN, NP-C [facility] [phone] I have personally obtained history,examined this patient, reviewed notes, independently viewed imaging studies, participated in medical decision making and plan of care.ROS completed by me personally and pertinent positives fully documented  I have made any additions or clarifications directly to the above note. Agree with note above. Line discharge to inpatient rehabilitation when bed avalable.discussed with patient and wife and answered questions. Antony Contras,  MD Medical Director [facility] Pager: [phone] 12/01/2018 4:07 PM    To contact Stroke Continuity provider, please refer to [email or web address] After hours, contact General Neurology

## 2018-12-02 ENCOUNTER — Inpatient Hospital Stay (HOSPITAL_COMMUNITY): Payer: Medicare HMO | Admitting: Speech Pathology

## 2018-12-02 ENCOUNTER — Inpatient Hospital Stay (HOSPITAL_COMMUNITY): Payer: Medicare HMO | Admitting: Occupational Therapy

## 2018-12-02 ENCOUNTER — Inpatient Hospital Stay (HOSPITAL_COMMUNITY): Payer: Medicare HMO

## 2018-12-02 DIAGNOSIS — M069 Rheumatoid arthritis, unspecified: Secondary | ICD-10-CM

## 2018-12-02 DIAGNOSIS — E871 Hypo-osmolality and hyponatremia: Secondary | ICD-10-CM

## 2018-12-02 DIAGNOSIS — I4819 Other persistent atrial fibrillation: Secondary | ICD-10-CM

## 2018-12-02 LAB — COMPREHENSIVE METABOLIC PANEL
ALT: 49 U/L — ABNORMAL HIGH (ref 0–44)
AST: 50 U/L — AB (ref 15–41)
Albumin: 3 g/dL — ABNORMAL LOW (ref 3.5–5.0)
Alkaline Phosphatase: 36 U/L — ABNORMAL LOW (ref 38–126)
Anion gap: 9 (ref 5–15)
BUN: 13 mg/dL (ref 8–23)
CALCIUM: 8.5 mg/dL — AB (ref 8.9–10.3)
CO2: 21 mmol/L — ABNORMAL LOW (ref 22–32)
Chloride: 102 mmol/L (ref 98–111)
Creatinine, Ser: 1.1 mg/dL (ref 0.61–1.24)
GFR calc Af Amer: >60 mL/min (ref >60)
GFR calc non Af Amer: >60 mL/min (ref >60)
Glucose, Bld: 87 mg/dL (ref 70–99)
Potassium: 3.7 mmol/L (ref 3.5–5.1)
Sodium: 132 mmol/L — ABNORMAL LOW (ref 135–145)
Total Bilirubin: 2.4 mg/dL — ABNORMAL HIGH (ref 0.3–1.2)
Total Protein: 7.1 g/dL (ref 6.5–8.1)

## 2018-12-02 LAB — CBC WITH DIFFERENTIAL/PLATELET
Abs Immature Granulocytes: 0.01 10*3/uL (ref 0.00–0.07)
Basophils Absolute: 0 10*3/uL (ref 0.0–0.1)
Basophils Relative: 1 %
Eosinophils Absolute: 0.2 10*3/uL (ref 0.0–0.5)
Eosinophils Relative: 4 %
HCT: 39.8 % (ref 39.0–52.0)
HEMOGLOBIN: 13.3 g/dL (ref 13.0–17.0)
Immature Granulocytes: 0 %
LYMPHS PCT: 22 %
Lymphs Abs: 0.8 10*3/uL (ref 0.7–4.0)
MCH: 31.6 pg (ref 26.0–34.0)
MCHC: 33.4 g/dL (ref 30.0–36.0)
MCV: 94.5 fL (ref 80.0–100.0)
Monocytes Absolute: 0.6 10*3/uL (ref 0.1–1.0)
Monocytes Relative: 16 %
Neutro Abs: 2.1 10*3/uL (ref 1.7–7.7)
Neutrophils Relative %: 57 %
Platelets: DECREASED 10*3/uL (ref 150–400)
RBC: 4.21 MIL/uL — ABNORMAL LOW (ref 4.22–5.81)
RDW: 13.1 % (ref 11.5–15.5)
WBC: 3.6 10*3/uL — ABNORMAL LOW (ref 4.0–10.5)
nRBC: 0 % (ref 0.0–0.2)

## 2018-12-02 MED ORDER — ADALIMUMAB 40 MG/0.8ML ~~LOC~~ AJKT
40.0000 mg | AUTO-INJECTOR | SUBCUTANEOUS | Status: DC
  Administered 2018-12-02 – 2018-12-16 (×2): 40 mg via SUBCUTANEOUS
  Filled 2018-12-02 (×3): qty 1

## 2018-12-02 MED ORDER — FUROSEMIDE 20 MG PO TABS
20.0000 mg | ORAL_TABLET | Freq: Every day | ORAL | Status: DC
  Administered 2018-12-02 – 2018-12-06 (×5): 20 mg via ORAL
  Filled 2018-12-02 (×5): qty 1

## 2018-12-02 MED ORDER — POTASSIUM CHLORIDE CRYS ER 20 MEQ PO TBCR
20.0000 meq | EXTENDED_RELEASE_TABLET | Freq: Two times a day (BID) | ORAL | Status: DC
  Administered 2018-12-02 – 2018-12-06 (×8): 20 meq via ORAL
  Filled 2018-12-02 (×8): qty 1

## 2018-12-02 NOTE — IPOC Note (Signed)
Overall Plan of Care [facility]) Patient Details Name: Barry Woods MRN: [id number] DOB: 19-Aug-1946  Admitting Diagnosis: <principal problem not specified>basal ganglia hemorrhage  Hospital Problems: Active Problems:   ICH (intracerebral hemorrhage) ([place])     Functional Problem List: Nursing Edema, Endurance, Medication Management, Pain, Perception, Safety, Sensory, Skin Integrity  PT Balance, Endurance, Motor, Sensory, Perception  OT Balance, Cognition, Perception, Edema, Safety, Endurance, Sensory, Motor, Skin Integrity  SLP Cognition, Linguistic, Nutrition  TR         Basic ADL's: OT Grooming, Bathing, Dressing, Toileting, Eating     Advanced  ADL's: OT Simple Meal Preparation, Light Housekeeping     Transfers: PT Bed Mobility, Bed to Chair, Car, Floor  OT Toilet     Locomotion: PT Ambulation, Emergency planning/management officer, Stairs     Additional Impairments: OT Fuctional Use of Upper Extremity  SLP Communication, Social Cognition, Swallowing expression Problem Solving, Memory, Attention, Awareness  TR      Anticipated Outcomes Item Anticipated Outcome  Self Feeding Supervision/setup   Swallowing  mod I    Basic self-care  Supervision/cuing   Toileting  Supervision/cuing    Bathroom Transfers Supervision/cuing   Bowel/Bladder  Continent with min assist  Transfers  S  Locomotion  S  Communication  mod I   Cognition  supervision  Pain  <3 on a 0-10 pain scale  Safety/Judgment  min assist with transfers and assist to bathroom   Therapy Plan: PT Intensity: Minimum of 1-2 x/day ,45 to 90 minutes PT Frequency: 5 out of 7 days PT Duration Estimated Length of Stay: 2.5-3 weeks OT Intensity: Minimum of 1-2 x/day, 45 to 90 minutes OT Frequency: 5 out of 7 days OT Duration/Estimated Length of Stay: 3-3.5 weeks  SLP Intensity: Minumum of 1-2 x/day, 30 to 90 minutes SLP Frequency: 3 to 5 out of 7 days SLP Duration/Estimated Length of Stay: 21-25 days     Team  Interventions: Nursing Interventions Patient/Family Education, Pain Management, Medication Management, Discharge Planning, Psychosocial Support, Disease Management/Prevention  PT interventions Ambulation/gait training, Balance/vestibular training, Cognitive remediation/compensation, DME/adaptive equipment instruction, Functional mobility training, Therapeutic Activities, UE/LE Strength taining/ROM, Visual/perceptual remediation/compensation, Wheelchair propulsion/positioning, UE/LE Coordination activities, Therapeutic Exercise, Stair training, Patient/family education, Neuromuscular re-education  OT Interventions Training and development officer, Community reintegration, Disease mangement/prevention, Technical sales engineer stimulation, Neuromuscular re-education, Patient/family education, Self Care/advanced ADL retraining, Splinting/orthotics, Therapeutic Exercise, UE/LE Coordination activities, Wheelchair propulsion/positioning, Visual/perceptual remediation/compensation, UE/LE Strength taining/ROM, Therapeutic Activities, Psychosocial support, Pain management, Functional mobility training, DME/adaptive equipment instruction, Discharge planning, Cognitive remediation/compensation  SLP Interventions Cognitive remediation/compensation, Cueing hierarchy, Dysphagia/aspiration precaution training, Environmental controls, Functional tasks, Internal/external aids, Patient/family education, Speech/Language facilitation  TR Interventions    SW/CM Interventions Discharge Planning, Psychosocial Support, Patient/Family Education   Barriers to Discharge MD  Medical stability  Nursing      PT Inaccessible home environment family planning on hiring assist as well as providing support  OT Home environment access/layout, Medical stability(Shower on 2nd level)    SLP      SW       Team Discharge Planning: Destination: PT-Home ,OT- Home , SLP-Home Projected Follow-up: PT-Home health PT, OT-  Home health OT, SLP-Home  Health SLP, 24 hour supervision/assistance Projected Equipment Needs: PT-To be determined, OT- To be determined, SLP-None recommended by SLP Equipment Details: PT- , OT-  Patient/family involved in discharge planning: PT- Patient,  OT-Patient, SLP-Patient  MD ELOS: 3 weeks Medical Rehab Prognosis:  Excellent Assessment: The patient has been admitted for CIR therapies with the diagnosis of right basal  ganglia hemorrhage. The team will be addressing functional mobility, strength, stamina, balance, safety, adaptive techniques and equipment, self-care, bowel and bladder mgt, patient and caregiver education, NMR, swallowing, cognition, visual-spatial awareness, community reentry, ego support. Goals have been set at supervision with self-care and mobility and supervision to mod I with cognition, swallowing and communication.    Meredith Staggers, MD, FAAPMR      See Team Conference Notes for weekly updates to the plan of care

## 2018-12-02 NOTE — Progress Notes (Signed)
Social Work  Social Work Assessment and Plan  Patient Details  Name: Barry Woods MRN: [id number] Date of Birth: 07/21/1946  Today's Date: 12/02/2018  Problem List:  Patient Active Problem List   Diagnosis Date Noted  . Hemorrhagic stroke ([place])   . Hyponatremia   . Dyslipidemia   . Tachypnea   . AKI (acute kidney injury) ([place])   . Cerebral cavernoma   . ICH (intracerebral hemorrhage) ([place]) 11/27/2018  . Diastolic CHF, chronic ([place]) 11/09/2018  . Atrial fibrillation ([place]) 11/07/2018  . Dyspnea on exertion 10/31/2018  . Coronary artery calcification seen on CT scan 10/31/2018  . Routine general medical examination at a health care facility 03/09/2018  . Benign essential tremor 02/16/2017  . Pure hypercholesterolemia 02/16/2017  . ILD (interstitial lung disease) ([place]) 10/21/2016  . BPH associated with nocturia 09/24/2015  . Rheumatoid arthritis ([place]) 09/28/2011  . Shoulder pain, bilateral 06/16/2011  . Gout attack 02/02/2011  . 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 LIVER FUNCTION TESTS, ABNORMAL, HX OF 11/19/2008  . Hyperlipidemia, mild 11/01/2007  . Essential hypertension 11/01/2007   Past Medical History:  Past Medical History:  Diagnosis Date  . Arthritis   . Cerebral cavernoma   . Hyperlipidemia   . Hypertension   . Rheumatoid arthritis [facility])    Past Surgical History:  Past Surgical History:  Procedure Laterality Date  . COLONOSCOPY    . LIPOMA EXCISION    . PALATE / UVULA BIOPSY / EXCISION    . TONSILLECTOMY     age 52   Social History:  reports that he has never smoked. He has never used smokeless tobacco. He reports current alcohol use of about 14.0 standard drinks of alcohol per week. He reports that he does not use drugs.  Family / Support Systems Marital Status: Divorced How Long?: 2000 Patient Roles: Parent, Other (Comment)(brother) Children: daughter, Barry Woods @ (c) [phone]; daughter, Barry Woods @ (C) [phone];  daughter, Barry Woods  (lives in [place]) Other Supports: sister, Barry Woods and her husband, Octavia Bruckner also very supportive. Anticipated Caregiver: daugthers, sister, hired help through agency Ability/Limitations of Caregiver: Min A Caregiver Availability: 24/7 Family Dynamics: Both local daughters are very involved and working together with one another and pt's sister to arrange any needed support.  Pt states that he will "defer to them" with discharge planning.  Social History Preferred language: English Religion:  Cultural Background: NA Read: Yes Write: Yes Employment Status: Retired Date Retired/Disabled/Unemployed: 2015 Public relations account executive Issues: None Guardian/Conservator: None - per MD, pt is capable of making decisions on his own behalf.   Abuse/Neglect Abuse/Neglect Assessment Can Be Completed: Yes Physical Abuse: Denies Verbal Abuse: Denies Sexual Abuse: Denies Exploitation of patient/patient's resources: Denies Self-Neglect: Denies  Emotional Status Pt's affect, behavior and adjustment status: Pt sitting up in w/c and able to complete assessment interview without much difficulty.  Brother-in-law at bedside and offers support if needed but pt does well.  Pt admits much frustration with his situation and "going from being completely independent to this..."  Describes himself as very active, golf player, etc.  Becomes a little tearful as he talks about this.  States he is motivated for CIR but may benefit from neuropsychology involvement for additional counseling support. Recent Psychosocial Issues: None Psychiatric History: None Substance Abuse History: None  Patient / Family Perceptions, Expectations & Goals Pt/Family understanding of illness & functional limitations: Pt and family with very good understanding of his medical issues, medication likely the culprit for the hemorrhage and  current functional limitations/ need for CIR.  Of note, both daughters are RNs. Premorbid pt/family  roles/activities: As noted, pt was completely independent and "just enjoying life."  Daughter are involved but have not really had to assist pt PTA. Anticipated changes in roles/activities/participation: All aware that team anticipates need for 24/7 assistacne at dc and are already preparing for this in pt's home.   Pt/family expectations/goals: "I just want to get my life back as much as I can."  [employer]: None Premorbid Home Care/DME Agencies: None Transportation available at discharge: yes Resource referrals recommended: Neuropsychology, Support group (specify)  Discharge Planning Living Arrangements: Alone Support Systems: Children, Other relatives, Friends/neighbors Type of Residence: Private residence Insurance Resources: Medicare(*Aetna Medicare) Financial Resources: Social Security Financial Screen Referred: No Living Expenses: Own Money Management: Patient Does the patient have any problems obtaining your medications?: No Home Management: pt Patient/Family Preliminary Plans: Pt to return to his townhome with daughters, other family and private duty personnel providing 24/7 support.  Daughter, [name], notes they are already addressing home modification needs. Social Work Anticipated Follow Up Needs: HH/OP Expected length of stay: 3 weeks  Clinical Impression Very pleasant gentleman here following a stroke and with left hemiparesis.  Excellent support from local family who are already in process of coordinating 24/7 support and making home modifications.  Pt is admittedly frustrated with loss of independence and will involve neuropsychology for additional emotional support.  Team anticipating supervision goals and pt pleased with this, however, goal is to eventually resume an independent lifestyle.    Arlena Marsan 12/02/2018, 3:33 PM

## 2018-12-02 NOTE — Evaluation (Signed)
Speech Language Pathology Assessment and Plan  Patient Details  Name: ELOISE MULA MRN: [id number] Date of Birth: 31-Dec-1945  SLP Diagnosis: Cognitive Impairments;Dysphagia;Dysarthria  Rehab Potential: Good ELOS: 21-25 days     Today's Date: 12/02/2018 SLP Individual Time: 1305-1400 SLP Individual Time Calculation (min): 55 min   Problem List:  Patient Active Problem List   Diagnosis Date Noted  . Hemorrhagic stroke ([place])   . Hyponatremia   . Dyslipidemia   . Tachypnea   . AKI (acute kidney injury) ([place])   . Cerebral cavernoma   . ICH (intracerebral hemorrhage) ([place]) 11/27/2018  . Diastolic CHF, chronic ([place]) 11/09/2018  . Atrial fibrillation ([place]) 11/07/2018  . Dyspnea on exertion 10/31/2018  . Coronary artery calcification seen on CT scan 10/31/2018  . Routine general medical examination at a health care facility 03/09/2018  . Benign essential tremor 02/16/2017  . Pure hypercholesterolemia 02/16/2017  . ILD (interstitial lung disease) ([place]) 10/21/2016  . BPH associated with nocturia 09/24/2015  . Rheumatoid arthritis ([place]) 09/28/2011  . Shoulder pain, bilateral 06/16/2011  . Gout attack 02/02/2011  . 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 LIVER FUNCTION TESTS, ABNORMAL, HX OF 11/19/2008  . Hyperlipidemia, mild 11/01/2007  . Essential hypertension 11/01/2007   Past Medical History:  Past Medical History:  Diagnosis Date  . Arthritis   . Cerebral cavernoma   . Hyperlipidemia   . Hypertension   . Rheumatoid arthritis [facility])    Past Surgical History:  Past Surgical History:  Procedure Laterality Date  . COLONOSCOPY    . LIPOMA EXCISION    . PALATE / UVULA BIOPSY / EXCISION    . TONSILLECTOMY     age 25    Assessment / Plan / Recommendation Clinical Impression   JAVARIE CRISP 73 year old right-handed male with history of hyperlipidemia, hypertension, rheumatoid arthritis maintained on chronic prednisone and receives scheduled injections of Humira, chronic  atrial fibrillation recently placed on Eliquis. Per chart review and family, patient lives alone independent prior to admission. Multilevel home with bedroom and bathroom upstairs. Local family works. Presented 11/27/2018 with left-sided weakness and slurred speech as well as a fall. Cranial CT scan reviewed showing right brain hemorrhage.Report of acute hemorrhage within the right lentiform nucleus measuring up to 3.9 cm. Associated edema and mass effect. Patient did receive Andexxa to reverse anticoagulation. Also noted mild hyponatremia 131 as well as creatinine 1.33. Neurosurgery Dr. Newman Pies consulted in regards to hemorrhage at 5 conservative care with latest follow-up MRI without change in right basal ganglia hematoma. There was noted a 4 mm acute infarction in the medial left subcortical white matter. No current plan for anticoagulation due to hemorrhage. Initially maintained on Cleviprex for blood pressure control. Tolerating a mechanical soft diet. Therapy evaluations completed with recommendations of physical medicine rehabilitation consult. Patient was admitted for a comprehensive rehabilitation program on 12/01/2018.  SLP evaluation was completed on 12/02/2018 with the following results:  Pt presents with mild-moderate cognitive deficits characterized by decreased recall of information, decreased selective attention to tasks, decreased functional problem solving, and decreased emergent awareness of deficits.  Currently pt requires overall mod assist to complete tasks due to the deficits mentioned above.   Pt also presents with a mild oropharyngeal dysphagia and reports that he only intermittently using swallowing precautions (chin tuck, intermittent throat clear).  During evaluation, pt used swallowing precautions in 100% of opportunities; however, suspect his consistency decreases in the presence of increased environmental distractions.  Recommend that pt remain on his currently prescribed  diet  with full supervision for use of precautions.   Additionally, pt presents with mild left sided oral motor deficits resulting in mild dysarthria characterized by imprecise articulation of consonants which impacts intelligibility at the conversational level.   As a result of the deficits mentioned above, pt would benefit from skilled ST while inpatient in order to maximize functional independence and reduce burden of care prior to discharge.  Anticipate that pt will need 24/7 supervision at discharge in addition to [place] follow up at next level of care.    Skilled Therapeutic Interventions          Cognitive-linguistic and bedside swallow evaluation completed with results and recommendations reviewed with patient.     SLP Assessment  Patient will need skilled Speech Lanaguage Pathology Services during CIR admission    Recommendations  SLP Diet Recommendations: Dysphagia 3 (Mech soft);Thin Liquid Administration via: Straw Medication Administration: Whole meds with puree Supervision: Patient able to self feed;Full supervision/cueing for compensatory strategies Compensations: Slow rate;Small sips/bites;Chin tuck;Clear throat intermittently Postural Changes and/or Swallow Maneuvers: Out of bed for meals;Seated upright 90 degrees Oral Care Recommendations: Oral care BID Recommendations for Other Services: Neuropsych consult Patient destination: Home Follow up Recommendations: Home Health SLP;24 hour supervision/assistance Equipment Recommended: None recommended by SLP    SLP Frequency 3 to 5 out of 7 days   SLP Duration  SLP Intensity  SLP Treatment/Interventions 21-25 days   Minumum of 1-2 x/day, 30 to 90 minutes  Cognitive remediation/compensation;Cueing hierarchy;Dysphagia/aspiration precaution training;Environmental controls;Functional tasks;Internal/external aids;Patient/family education;Speech/Language facilitation    Pain Pain Assessment Pain Scale: 0-10 Pain Score: 0-No  pain  Prior Functioning Cognitive/Linguistic Baseline: Within functional limits Type of Home: House  Lives With: Alone Available Help at Discharge: Family;Available 24 hours/day Vocation: Retired  Industrial/product designer Term Goals: Week 1: SLP Short Term Goal 1 (Week 1): Pt will consume therapeutic trials of regular textures with mod I use of swallowing precautions.   SLP Short Term Goal 2 (Week 1): Pt will utilize chin tuck in >90% of opportunities when consuming thin liquids with mod I.   SLP Short Term Goal 3 (Week 1): Pt will complete mildly complex daily tasks with min assist verbal cues for functional problem solving.   SLP Short Term Goal 4 (Week 1): Pt will recall mildly complex, daily information with min assist verbal cues for use of external aids.   SLP Short Term Goal 5 (Week 1): Pt will recognize and correct errors in the moment during functional tasks with min assist verbal cues.   SLP Short Term Goal 6 (Week 1): Pt will selectively attend to tasks in a mildly distracting environment for 15 minute intervals with min verbal cues for redirection to task.    Refer to Care Plan for Long Term Goals  Recommendations for other services: Neuropsych  Discharge Criteria: Patient will be discharged from SLP if patient refuses treatment 3 consecutive times without medical reason, if treatment goals not met, if there is a change in medical status, if patient makes no progress towards goals or if patient is discharged from hospital.  The above assessment, treatment plan, treatment alternatives and goals were discussed and mutually agreed upon: by patient  Emilio Math 12/02/2018, 3:26 PM

## 2018-12-02 NOTE — Evaluation (Signed)
Occupational Therapy Assessment and Plan  Patient Details  Name: Barry Woods MRN: [id number] Date of Birth: January 24, 1946  OT Diagnosis: abnormal posture, cognitive deficits, hemiplegia affecting non-dominant side, muscle weakness (generalized) and swelling of limb Rehab Potential: Rehab Potential (ACUTE ONLY): Good ELOS: 3-3.5 weeks     Today's Date: 12/02/2018 OT Individual Time: 7858-8502 and [id number] OT Individual Time Calculation (min): 61 min and 35 min  Problem List:  Patient Active Problem List   Diagnosis Date Noted  . Hemorrhagic stroke ([place])   . Hyponatremia   . Dyslipidemia   . Tachypnea   . AKI (acute kidney injury) ([place])   . Cerebral cavernoma   . ICH (intracerebral hemorrhage) ([place]) 11/27/2018  . Diastolic CHF, chronic ([place]) 11/09/2018  . Atrial fibrillation ([place]) 11/07/2018  . Dyspnea on exertion 10/31/2018  . Coronary artery calcification seen on CT scan 10/31/2018  . Routine general medical examination at a health care facility 03/09/2018  . Benign essential tremor 02/16/2017  . Pure hypercholesterolemia 02/16/2017  . ILD (interstitial lung disease) ([place]) 10/21/2016  . BPH associated with nocturia 09/24/2015  . Rheumatoid arthritis ([place]) 09/28/2011  . Shoulder pain, bilateral 06/16/2011  . Gout attack 02/02/2011  . 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 LIVER FUNCTION TESTS, ABNORMAL, HX OF 11/19/2008  . Hyperlipidemia, mild 11/01/2007  . Essential hypertension 11/01/2007    Past Medical History:  Past Medical History:  Diagnosis Date  . Arthritis   . Cerebral cavernoma   . Hyperlipidemia   . Hypertension   . Rheumatoid arthritis [facility])    Past Surgical History:  Past Surgical History:  Procedure Laterality Date  . COLONOSCOPY    . LIPOMA EXCISION    . PALATE / UVULA BIOPSY / EXCISION    . TONSILLECTOMY     age 55    Assessment & Plan Clinical Impression: Barry Woods 73 year old right-handed male with history of hyperlipidemia,  hypertension, rheumatoid arthritis maintained on chronic prednisone and receives scheduled injections of Humira, chronic atrial fibrillation recently placed on Eliquis. Per chart review and family, patient lives alone independent prior to admission. Multilevel home with bedroom and bathroom upstairs. Local family works. Presented 11/27/2018 with left-sided weakness and slurred speech as well as a fall. Cranial CT scan reviewed showing right brain hemorrhage.Report of acute hemorrhage within the right lentiform nucleus measuring up to 3.9 cm. Associated edema and mass effect. Patient did receive Andexxa to reverse anticoagulation. Also noted mild hyponatremia 131 as well as creatinine 1.33. Neurosurgery Dr. Newman Pies consulted in regards to hemorrhage at 5 conservative care with latest follow-up MRI without change in right basal ganglia hematoma. There was noted a 4 mm acute infarction in the medial left subcortical white matter. No current plan for anticoagulation due to hemorrhage. Initially maintained on Cleviprex for blood pressure control. Tolerating a mechanical soft diet. Therapy evaluations completed with recommendations of physical medicine rehabilitation consult. Patient was admitted for a comprehensive rehabilitation program.  Patient currently requires mod- max with basic self-care skills secondary to muscle weakness and muscle paralysis, decreased cardiorespiratoy endurance, unbalanced muscle activation, decreased coordination and tremors, decreased attention to left and left side neglect, decreased attention, decreased awareness, decreased problem solving and decreased safety awareness and decreased sitting balance, decreased standing balance, decreased postural control, hemiplegia and decreased balance strategies.  Prior to hospitalization, patient could complete BADLs with independent .  Patient will benefit from skilled intervention to increase independence with basic self-care skills prior  to discharge home with family.  Anticipate patient will  require 24 hour supervision and follow up home health.  OT - End of Session Endurance Deficit: Yes Endurance Deficit Description: Pt required supine rest break after getting dressed due to fatigue  OT Assessment Rehab Potential (ACUTE ONLY): Good OT Barriers to Discharge: Home environment access/layout;Medical stability(Shower on 2nd level) OT Patient demonstrates impairments in the following area(s): Balance;Cognition;Perception;Edema;Safety;Endurance;Sensory;Motor;Skin Integrity OT Basic ADL's Functional Problem(s): Grooming;Bathing;Dressing;Toileting;Eating OT Advanced ADL's Functional Problem(s): Simple Meal Preparation;Light Housekeeping OT Transfers Functional Problem(s): Toilet OT Additional Impairment(s): Fuctional Use of Upper Extremity OT Plan OT Intensity: Minimum of 1-2 x/day, 45 to 90 minutes OT Frequency: 5 out of 7 days OT Duration/Estimated Length of Stay: 3-3.5 weeks  OT Treatment/Interventions: Balance/vestibular training;Community reintegration;Disease mangement/prevention;Functional electrical stimulation;Neuromuscular re-education;Patient/family education;Self Care/advanced ADL retraining;Splinting/orthotics;Therapeutic Exercise;UE/LE Coordination activities;Wheelchair propulsion/positioning;Visual/perceptual remediation/compensation;UE/LE Strength taining/ROM;Therapeutic Activities;Psychosocial support;Pain management;Functional mobility training;DME/adaptive equipment instruction;Discharge planning;Cognitive remediation/compensation OT Self Feeding Anticipated Outcome(s): Supervision/setup  OT Basic Self-Care Anticipated Outcome(s): Supervision/cuing  OT Toileting Anticipated Outcome(s): Supervision/cuing  OT Bathroom Transfers Anticipated Outcome(s): Supervision/cuing  OT Recommendation Recommendations for Other Services: Therapeutic Recreation consult Therapeutic Recreation Interventions: Pet therapy Patient  destination: Home Follow Up Recommendations: Home health OT Equipment Recommended: To be determined  Skilled Therapeutic Intervention Skilled OT session completed with focus on initial evaluation, education on OT role/POC, and establishment of patient-centered goals. Pt completed self feeding EOB and dressing sit<stand from EOB during session. Pt presents with Rt and posterior lean in sitting. Per pt, Rt lean is baseline. During self feeding, OT facilitated [facility] for incorporating L UE to stabilize beverages and eating containers. Unable to lift arm onto bedside table and hold containers with assist, though pt exhibits developing strength and gross motor control throughout limb. Min swelling in forearm. He uses a large bowl spoon per baseline due to resting tremor. Pt required mod vcs for sustained attention to task and for correcting posterior LOBs when eating. During dressing, he required Min-Mod vcs for locating clothing items placed on Lt side. Able to problem solve clothing orientation with questioning cues alone. Mod A for sit<stand to pull pants up over hips. He required assist for Lt side for donning both shirt and pants. Max A for donning socks and shoes due to combined balance and UE demands. Mod A stand step transfer to recliner and LEs elevated for comfort (no buckling observed L LE). Pt left with RN, safety belt fastened, and all needs within reach.   2nd Session 1:1 tx (35 min) Pt greeted in recliner with family present. They left at start of session. Practiced simulated toilet transfer, with pt completing stand step transfer<w/c<low toilet. Mod A for power up from toilet, and vcs/manual assist for L UE/LE placement. Once in w/c, pt agreeable to complete oral care/grooming tasks. He opted to sit vs stand due to fatigue. HOH for incorporating Lt with pt able to use limb to squeeze out toothpaste and manage faucet levers. Without OT assist, his limb would slip off of sink and into lap due to  inattention. Afterwards he completed stand step transfer back to recliner in manner as written above. Pt left with safety belt fastened, call bell, and family present.   OT Evaluation Precautions/Restrictions  Precautions Precautions: Fall Precaution Comments: left inattention General Chart Reviewed: Yes Family/Caregiver Present: No Pain Pain Assessment Pain Score: 0-No pain Home Living/Prior Functioning Home Living Family/patient expects to be discharged to:: Private residence Living Arrangements: Alone Available Help at Discharge: Family(per chart, family can piece together 24/7 supervision) Type of Home: House Home Access: Level entry Home Layout:  Multi-level, Bed/bath upstairs, 1/2 bath on main level(Half bath only has standard toilet) Alternate Level Stairs-Number of Steps: flight to bedroom Alternate Level Stairs-Rails: Right, Left Bathroom Shower/Tub: Walk-in shower, Tub/shower unit(Not on main level)  Lives With: Alone IADL History Homemaking Responsibilities: Yes Meal Prep Responsibility: Primary Laundry Responsibility: Primary Cleaning Responsibility: Primary Bill Paying/Finance Responsibility: Primary Shopping Responsibility: Primary Occupation: Retired Type of Occupation: Retired from [place] and [place]: Golfing  IADL Comments: Pt active PTA, running/walking 2 miles each day, independent with ADLs/IADLs and driving  Prior Function Level of Independence: Independent with basic ADLs, Independent with homemaking with ambulation, Independent with gait  Able to Take Stairs?: Yes Driving: Yes Vocation: Retired Leisure: Hobbies-yes (Comment) Comments: golf, gun collector, walks 2 miles a day ADL ADL Eating: Moderate cueing, Contact guard Where Assessed-Eating: Edge of bed Where Assessed-Upper Body Dressing: Edge of bed Lower Body Dressing: Minimal assistance Where Assessed-Lower Body Dressing: Edge of bed Toileting: Maximal  assistance Vision Baseline Vision/History: No visual deficits Patient Visual Report: No change from baseline Vision Assessment?: No apparent visual deficits Additional Comments: Able to read meal receipt and therapist names on his daily schedule without issue  Perception  Perception: Impaired Inattention/Neglect: Does not attend to left side of body Spatial Orientation: decreased with weight shift L Praxis   Cognition Overall Cognitive Status: Impaired/Different from baseline Arousal/Alertness: Awake/alert Orientation Level: Person;Place;Situation Person: Oriented Place: Oriented Situation: Oriented Year: 2020 Month: January Day of Week: Correct Immediate Memory Recall: Sock;Blue;Bed Memory Recall: Sock;Blue;Bed Memory Recall Sock: With Cue Memory Recall Blue: Without Cue Memory Recall Bed: Without Cue Attention: Sustained Sustained Attention: Impaired Awareness: Impaired Problem Solving: Impaired Safety/Judgment: Appears intact Sensation Sensation Light Touch: Impaired Detail(Diminished/Absent Lt fingertips) Proprioception: Impaired by gross assessment;Impaired Detail Proprioception Impaired Details: Impaired LUE Additional Comments: decreased to light touch L as compared to R Coordination Gross Motor Movements are Fluid and Coordinated: No Fine Motor Movements are Fluid and Coordinated: No Coordination and Movement Description: Lt hemiplegia Motor  Motor Motor: Hemiplegia;Abnormal postural alignment and control Motor - Skilled Clinical Observations: L side weakness, L lateral lean in standing Mobility  Bed Mobility Bed Mobility: Rolling Right;Rolling Left;Right Sidelying to Sit;Sitting - Scoot to [employer] of Bed Rolling Right: Supervision/verbal cueing Rolling Left: Supervision/Verbal cueing Right Sidelying to Sit: Minimal Assistance - Patient > 75% Sitting - Scoot to Edge of Bed: Supervision/Verbal cueing Transfers Sit to Stand: Moderate Assistance - Patient  50-74% Stand to Sit: Minimal Assistance - Patient > 75%  Trunk/Postural Assessment  Cervical Assessment Cervical Assessment: Exceptions to WFL(forward head) Cervical AROM Overall Cervical AROM Comments: forward head, tipped to R Thoracic Assessment Thoracic Assessment: (Rounded shoulders, depressed Lt shoulder) Thoracic AROM Overall Thoracic AROM Comments: rounded shoulders, increased on L compared to R Lumbar Assessment Lumbar Assessment: Exceptions to WFL(posterior pelvic tilt) Lumbar AROM Overall Lumbar AROM Comments: posterior pelvic tilt, weight shifted L Postural Control Postural Control: Deficits on evaluation(Multiple LOBs posterioraly when sitting EOB during breakfast. LOBs to Rt as well.) Postural Limitations: as noted above  Balance Balance Balance Assessed: Yes Static Sitting Balance Static Sitting - Balance Support: No upper extremity supported;Feet supported Static Sitting - Level of Assistance: 7: Independent Dynamic Sitting Balance Dynamic Sitting - Balance Support: No upper extremity supported;During functional activity;Feet supported Dynamic Sitting - Level of Assistance: 4: Min assist Sitting balance - Comments: Donning overhead shirt Static Standing Balance Static Standing - Balance Support: Bilateral upper extremity supported Static Standing - Level of Assistance: 4: Min assist Dynamic Standing Balance Dynamic Standing -  Balance Support: During functional activity;No upper extremity supported Dynamic Standing - Level of Assistance: 3: Mod assist(Elevating pants over hips) Extremity/Trunk Assessment RUE Assessment RUE Assessment: Exceptions to [facility] General Strength Comments: WFL with tremors present LUE Assessment LUE Assessment: Exceptions to [facility] Passive Range of Motion (PROM) Comments: WFL Active Range of Motion (AROM) Comments: limited due to weakness General Strength Comments: Pt with developing strength and gross motor control, active assist required  to raise shoulder against gravity. Pt with 3+/5 strength in biceps/triceps in gravity minimized positions LUE Body System: Neuro Brunstrum levels for arm and hand: Arm Brunstrum level for arm: Stage IV Movement is deviating from synergy     Refer to Care Plan for Long Term Goals  Recommendations for other services: Therapeutic Recreation  Pet therapy   Discharge Criteria: Patient will be discharged from OT if patient refuses treatment 3 consecutive times without medical reason, if treatment goals not met, if there is a change in medical status, if patient makes no progress towards goals or if patient is discharged from hospital.  The above assessment, treatment plan, treatment alternatives and goals were discussed and mutually agreed upon: by patient  Skeet Simmer 12/02/2018, 12:45 PM

## 2018-12-02 NOTE — Evaluation (Signed)
Physical Therapy Assessment and Plan  Patient Details  Name: Barry Woods MRN: [id number] Date of Birth: 19-Jun-1946  PT Diagnosis: Abnormality of gait and Hemiplegia non-dominant Rehab Potential: Good ELOS: 2.5-3 weeks   Today's Date: 12/02/2018 PT Individual Time:0900  - 1000      Problem List:  Patient Active Problem List   Diagnosis Date Noted  . Hemorrhagic stroke ([place])   . Hyponatremia   . Dyslipidemia   . Tachypnea   . AKI (acute kidney injury) ([place])   . Cerebral cavernoma   . ICH (intracerebral hemorrhage) ([place]) 11/27/2018  . Diastolic CHF, chronic ([place]) 11/09/2018  . Atrial fibrillation ([place]) 11/07/2018  . Dyspnea on exertion 10/31/2018  . Coronary artery calcification seen on CT scan 10/31/2018  . Routine general medical examination at a health care facility 03/09/2018  . Benign essential tremor 02/16/2017  . Pure hypercholesterolemia 02/16/2017  . ILD (interstitial lung disease) ([place]) 10/21/2016  . BPH associated with nocturia 09/24/2015  . Rheumatoid arthritis ([place]) 09/28/2011  . Shoulder pain, bilateral 06/16/2011  . Gout attack 02/02/2011  . 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 LIVER FUNCTION TESTS, ABNORMAL, HX OF 11/19/2008  . Hyperlipidemia, mild 11/01/2007  . Essential hypertension 11/01/2007    Past Medical History:  Past Medical History:  Diagnosis Date  . Arthritis   . Cerebral cavernoma   . Hyperlipidemia   . Hypertension   . Rheumatoid arthritis [facility])    Past Surgical History:  Past Surgical History:  Procedure Laterality Date  . COLONOSCOPY    . LIPOMA EXCISION    . PALATE / UVULA BIOPSY / EXCISION    . TONSILLECTOMY     age 54    Assessment & Plan Clinical Impression: Barry Woods 73 year old right-handed male with history of hyperlipidemia, hypertension, rheumatoid arthritis maintained on chronic prednisone and receives scheduled injections of Humira, chronic atrial fibrillation recently placed on Eliquis. Per chart review  and family, patient lived alone independent prior to admission. Multilevel home with bedroom and bathroom upstairs. Local family works. Presented 11/27/2018 with left-sided weakness and slurred speech as well as a fall. Cranial CT scan reviewed showing right brain hemorrhage.Report of acute hemorrhage within the right lentiform nucleus measuring up to 3.9 cm. Associated edema and mass effect. Patient did receive Andexxa to reverse anticoagulation. Also noted mild hyponatremia 131 as well as creatinine 1.33. Neurosurgery Dr. Newman Pies consulted in regards to hemorrhage at 5 conservative care with latest follow-up MRI without change in right basal ganglia hematoma. There was noted a 4 mm acute infarction in the medial left subcortical white matter. No current plan for anticoagulation due to hemorrhage.  Patient transferred to CIR on 12/01/2018 .   Patient currently requires mod with mobility secondary to impaired timing and sequencing, abnormal tone and decreased coordination, decreased midline orientation and decreased attention to left and decreased attention, decreased problem solving and decreased safety awareness.  Prior to hospitalization, patient was independent  with mobility and lived with Alone in a House home.  Home access is  Level entry, but flight of stairs to bedrooms.   Patient will benefit from skilled PT intervention to maximize safe functional mobility, minimize fall risk and decrease caregiver burden for planned discharge home with 24 hour supervision.  Anticipate patient will benefit from follow up [place] at discharge.  PT - End of Session Activity Tolerance: Decreased this session;Tolerates 30+ min activity with multiple rests Endurance Deficit: Yes PT Assessment Rehab Potential (ACUTE/IP ONLY): Good PT Barriers to Discharge: [place] home  environment PT Barriers to Discharge Comments: family planning on hiring assist as well as providing support PT Patient demonstrates  impairments in the following area(s): Balance;Endurance;Motor;Sensory;Perception PT Transfers Functional Problem(s): Bed Mobility;Bed to Chair;Car;Floor PT Locomotion Functional Problem(s): Ambulation;Wheelchair Mobility;Stairs PT Plan PT Intensity: Minimum of 1-2 x/day ,45 to 90 minutes PT Frequency: 5 out of 7 days PT Duration Estimated Length of Stay: 2.5-3 weeks PT Treatment/Interventions: Ambulation/gait training;Balance/vestibular training;Cognitive remediation/compensation;DME/adaptive equipment instruction;Functional mobility training;Therapeutic Activities;UE/LE Strength taining/ROM;Visual/perceptual remediation/compensation;Wheelchair propulsion/positioning;UE/LE Coordination activities;Therapeutic Exercise;Stair training;Patient/family education;Neuromuscular re-education PT Transfers Anticipated Outcome(s): S PT Locomotion Anticipated Outcome(s): S PT Recommendation Follow Up Recommendations: Home health PT Patient destination: Home Equipment Recommended: To be determined  Skilled Therapeutic Intervention Patient seen for initial assessment and education regarding PT POC.  Patient in recliner, stand pivot to w/c mod A for lifting and balance, cues for L side awareness, placement.  Patient propelled w/c with R UE and LE min A to S x 100'.  Able to stand and ambulate 45' with RW and mod A for R lateral weight shift and cues for clearing L foot.  Without device with min/mod A as well and assist for weight shift. Patient negotiated stairs x 4 with railings and min to mod A cues for technique and safety throughout.  Patient performed car transfer mod A and demonstrates decreased awareness of safety preferring to perform activities as he is accustomed instead of per PT suggestions for safety.  Patient pushed in chair back to room where there was a visitor and assisted to recliner with alarm safety belt in place and call bell in reach.   PT  Evaluation Precautions/Restrictions Precautions Precautions: None Precaution Comments: left inattention Pain Pain Assessment Pain Scale: 0-10 Pain Score: 0-No pain Home Living/Prior Functioning Home Living Available Help at Discharge: Family(per chart, family can piece together 24/7 supervision) Type of Home: House Home Access: Level entry Home Layout: Multi-level;Bed/bath upstairs;1/2 bath on main level(Half bath only has standard toilet) Alternate Level Stairs-Number of Steps: flight to bedroom Alternate Level Stairs-Rails: Right;Left Bathroom Shower/Tub: Walk-in shower;Tub/shower unit(Not on main level)  Lives With: Alone Prior Function Level of Independence: Independent with basic ADLs;Independent with homemaking with ambulation;Independent with gait  Able to Take Stairs?: Yes Driving: Yes Vocation: Retired Leisure: Hobbies-yes (Comment) Comments: golf, gun collector, walks 2 miles a day Vision/Perception  Perception Perception: Impaired Inattention/Neglect: Does not attend to left side of body Spatial Orientation: decreased with weight shift L  Cognition Overall Cognitive Status: Impaired/Different from baseline Arousal/Alertness: Awake/alert Orientation Level: Oriented X4 Attention: Selective Sustained Attention: Impaired Selective Attention: Impaired Selective Attention Impairment: Functional basic Memory: Impaired Memory Impairment: Retrieval deficit;Decreased recall of new information Awareness: Impaired Awareness Impairment: Emergent impairment Problem Solving: Impaired Problem Solving Impairment: Functional basic Safety/Judgment: Impaired Comments: decreased L side awareness, needs cues for safety Sensation Sensation Light Touch: Impaired by gross assessment Proprioception: Impaired by gross assessment;Impaired Detail Proprioception Impaired Details: Impaired LLE Additional Comments: decreased to light touch L as compared to R Coordination Gross Motor  Movements are Fluid and Coordinated: No Fine Motor Movements are Fluid and Coordinated: No Coordination and Movement Description: Lt hemiplegia Motor  Motor Motor: Hemiplegia;Abnormal postural alignment and control Motor - Skilled Clinical Observations: L side weakness, L lateral lean in standing  Mobility Bed Mobility Bed Mobility: Rolling Right;Rolling Left;Right Sidelying to Sit;Sitting - Scoot to [employer] of Bed Rolling Right: Supervision/verbal cueing Rolling Left: Supervision/Verbal cueing Right Sidelying to Sit: Minimal Assistance - Patient > 75% Sitting - Scoot to Edge of Bed: Supervision/Verbal cueing Transfers  Transfers: Sit to Stand;Stand to [employer] Pivot Transfers Sit to Stand: Moderate Assistance - Patient 50-74% Stand to Sit: Minimal Assistance - Patient > 75% Stand Pivot Transfers: Moderate Assistance - Patient 50 - 74% Stand Pivot Transfer Details: Manual facilitation for weight shifting;Verbal cues for technique Stand Pivot Transfer Details (indicate cue type and reason): stand pivot with and without walker increased time, cues for moving L LE Squat Pivot Transfers: Minimal Assistance - Patient > 75% Transfer (Assistive device): Rolling walker Locomotion  Gait Ambulation: Yes Gait Assistance: Moderate Assistance - Patient 50-74% Gait Distance (Feet): 75 Feet Assistive device: Rolling walker Gait Assistance Details: Manual facilitation for weight shifting;Verbal cues for precautions/safety;Verbal cues for safe use of DME/AE Gait Assistance Details: without device min/mod A x 45' Stairs / Additional Locomotion Stairs: Yes Stairs Assistance: Moderate Assistance - Patient 50 - 74% Stair Management Technique: Step to pattern;Forwards;One rail Left;Two rails Number of Stairs: 4 Height of Stairs: 6 Wheelchair Mobility Wheelchair Mobility: Yes Wheelchair Assistance: Minimal assistance - Patient >75% Wheelchair Propulsion: Right upper  extremity;Right lower extremity Wheelchair Parts Management: Needs assistance Distance: increased time, mostly using R LE, assist around cornders, with big turns, otherwise S  Trunk/Postural Assessment  Cervical Assessment Cervical Assessment: Exceptions to [facility] Cervical AROM Overall Cervical AROM Comments: forward head, tipped to R Thoracic Assessment Thoracic Assessment: Exceptions to [facility] Thoracic AROM Overall Thoracic AROM Comments: rounded shoulders, increased on L compared to R Lumbar Assessment Lumbar Assessment: Exceptions to [facility] Lumbar AROM Overall Lumbar AROM Comments: posterior pelvic tilt, weight shifted L Postural Control Postural Control: Deficits on evaluation Postural Limitations: as noted above  Balance Balance Balance Assessed: Yes Static Sitting Balance Static Sitting - Balance Support: No upper extremity supported;Feet supported Static Sitting - Level of Assistance: 7: Independent Dynamic Sitting Balance Dynamic Sitting - Balance Support: No upper extremity supported Dynamic Sitting - Level of Assistance: 5: Stand by assistance Sitting balance - Comments: can reach to feet bilaterally Static Standing Balance Static Standing - Balance Support: Bilateral upper extremity supported Static Standing - Level of Assistance: 4: Min assist Dynamic Standing Balance Dynamic Standing - Balance Support: Bilateral upper extremity supported;Right upper extremity supported Dynamic Standing - Level of Assistance: 4: Min assist Extremity Assessment  RUE Assessment RUE Assessment: Exceptions to [facility] General Strength Comments: WFL with tremors present LUE Assessment LUE Assessment: Exceptions to [facility] Passive Range of Motion (PROM) Comments: WFL Active Range of Motion (AROM) Comments: limited due to weakness General Strength Comments: shoulder elevation to about 80, elbow flexion 3+/5 LUE Body System: Neuro Brunstrum levels for arm and hand: Arm Brunstrum level for arm: Stage  IV Movement is deviating from synergy RLE Assessment RLE Assessment: Within Functional Limits LLE Assessment LLE Assessment: Exceptions to [facility] Active Range of Motion (AROM) Comments: [facility] General Strength Comments: strength hip flexion 4+/5. knee extension 4-/5, ankle DF 4/5    Refer to Care Plan for Long Term Goals  Recommendations for other services: None   Discharge Criteria: Patient will be discharged from PT if patient refuses treatment 3 consecutive times without medical reason, if treatment goals not met, if there is a change in medical status, if patient makes no progress towards goals or if patient is discharged from hospital.  The above assessment, treatment plan, treatment alternatives and goals were discussed and mutually agreed upon: by patient  Reginia Naas 12/02/2018, 6:18 PM  Magda Kiel, [place] 12/02/2018

## 2018-12-02 NOTE — Progress Notes (Signed)
If these abnormal clinical findings persist, appropriate workup will be completed. The patient understands that follow up is required to elucidate the situation. Patient information reviewed and entered into [facility] System by Ileana Ladd, [place] coordinator. Information including medical coding, function ability, and quality indicators will be reviewed and updated through discharge.

## 2018-12-02 NOTE — Progress Notes (Addendum)
[place] PHYSICAL MEDICINE & REHABILITATION PROGRESS NOTE   Subjective/Complaints: Pt up with therapy trying to eat breakfast. Concerned that his weight is up. Was short of breath last night and thinks he's fluid overloaded. Says his "good" weight is in the 160's.    ROS: Patient denies fever, rash, sore throat, blurred vision, nausea, vomiting, diarrhea, cough,   chest pain, joint or back pain, headache, or mood change.    Objective:   No results found. Recent Labs    12/02/18 0725  WBC 3.6*  HGB 13.3  HCT 39.8  PLT PLATELET CLUMPS NOTED ON SMEAR, COUNT APPEARS DECREASED   Recent Labs    11/30/18 0440 12/02/18 0725  NA 133* 132*  K 3.8 3.7  CL 106 102  CO2 21* 21*  GLUCOSE 88 87  BUN 14 13  CREATININE 1.11 1.10  CALCIUM 8.6* 8.5*    Intake/Output Summary (Last 24 hours) at 12/02/2018 1020 Last data filed at 12/02/2018 0855 Gross per 24 hour  Intake 120 ml  Output 400 ml  Net -280 ml     Physical Exam: Vital Signs Blood pressure (!) 145/93, pulse 71, temperature (!) 97.3 F (36.3 C), temperature source Axillary, resp. rate 15, height 5\' 7"  (1.702 m), weight 77.9 kg, SpO2 98 %.  Constitutional: No distress . Vital signs reviewed. HEENT: EOMI, oral membranes moist Neck: supple Cardiovascular: RRR without murmur. No JVD    Respiratory: good air movement. ?crackles right base   GI: BS +, non-tender, non-distended  Extremities: No clubbing, cyanosis, or edema Skin: No evidence of breakdown, no evidence of rash Neurologic: reasonable insight and awareness. Speech slightly slurred.  left central 7.  motor strength is 5/5 in RIght  deltoid, bicep, tricep, grip, hip flexor, knee extensors, ankle dorsiflexor and plantar flexor LUE: 3-/5 Left delt, bi , tri. 2/5 grip, 3-4/5 Left HF, KE, 2-3/5 ADF/PF Normal sensation on right. Slightly reduced sensation to LT LUE and LLE Musculoskeletal: Full range of motion in all 4 extremities. No joint  swelling   Assessment/Plan: 1. 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  Physiatrist is providing close team supervision and 24 hour management of active medical problems listed below.  Physiatrist and rehab team continue to assess barriers to discharge/monitor patient progress toward functional and medical goals  Care Tool:  Bathing  Bathing activity did not occur: (Not assessed)           Bathing assist       Upper Body Dressing/Undressing Upper body dressing   What is the patient wearing?: Pull over shirt    Upper body assist Assist Level: Minimal Assistance - Patient > 75%    Lower Body Dressing/Undressing Lower body dressing      What is the patient wearing?: Pants     Lower body assist Assist for lower body dressing: Maximal Assistance - Patient 25 - 49%     Toileting Toileting Toileting Activity did not occur (Clothing management and hygiene only): N/A (no void or bm)  Toileting assist Assist for toileting: Moderate Assistance - Patient 50 - 74%     Transfers Chair/bed transfer  Transfers assist           Locomotion Ambulation   Ambulation assist              Walk 10 feet activity   Assist           Walk  50 feet activity   Assist           Walk 150 feet activity   Assist           Walk 10 feet on uneven surface  activity   Assist           Wheelchair     Assist               Wheelchair 50 feet with 2 turns activity    Assist            Wheelchair 150 feet activity     Assist           Medical Problem List and Plan: 1.Left side weakness with dysarthriasecondary to right basal ganglia ICH as well as left subcortical CVA  -beginning therapies today 2. DVT Prophylaxis/Anticoagulation: SCDs. Monitor for any signs of DVT 3. Pain  Management:Tylenol as needed 4. Mood:Provide emotional support 5. Neuropsych: This patientiscapable of making decisions on hisown behalf. 6. Skin/Wound Care:Routine skin checks 7. Fluids/Electrolytes/Nutrition:encourage PO  -I personally reviewed the patient's labs today.    -sodium following slowly (132 1/10)----recheck tomorrow, may worsen with addition of lasix 8. Atrial fibrillation. ELIQUIS discontinued due to [place]. Cardiac rate controlled. Continue Tenormin 75 mg daily.   -outpt follow up with Dr. Angelena Form as needed and await plan for possible cardioversion  -weight was most recently 171 lbs yesterday  -looks like he's been on 20-40mg  of lasix daily in the past  -doesn't appear fluid overloaded on exam  -will begin lasix 20mg  daily  -follow daily weights  -increase potassium supplement to 20 meq bid  -pt states cardiology saw him this morning. I see no note. Will consult them as needed 9. Hyperlipidemia. Zocor 10. Rheumatoid arthritis. Patient on chronic prednisone as well as scheduled injections of Humira prior to admission. Will Discuss plan of care.    LOS: 1 days A FACE TO [place] 12/02/2018, 10:20 AM

## 2018-12-03 ENCOUNTER — Inpatient Hospital Stay (HOSPITAL_COMMUNITY): Payer: Medicare HMO | Admitting: Physical Therapy

## 2018-12-03 ENCOUNTER — Inpatient Hospital Stay (HOSPITAL_COMMUNITY): Payer: Medicare HMO

## 2018-12-03 ENCOUNTER — Inpatient Hospital Stay (HOSPITAL_COMMUNITY): Payer: Medicare HMO | Admitting: Occupational Therapy

## 2018-12-03 DIAGNOSIS — I10 Essential (primary) hypertension: Secondary | ICD-10-CM

## 2018-12-03 DIAGNOSIS — I4891 Unspecified atrial fibrillation: Secondary | ICD-10-CM

## 2018-12-03 LAB — BASIC METABOLIC PANEL
Anion gap: 9 (ref 5–15)
BUN: 14 mg/dL (ref 8–23)
CO2: 19 mmol/L — ABNORMAL LOW (ref 22–32)
Calcium: 8.5 mg/dL — ABNORMAL LOW (ref 8.9–10.3)
Chloride: 104 mmol/L (ref 98–111)
Creatinine, Ser: 1.02 mg/dL (ref 0.61–1.24)
GFR calc Af Amer: >60 mL/min (ref >60)
GFR calc non Af Amer: >60 mL/min (ref >60)
Glucose, Bld: 90 mg/dL (ref 70–99)
Potassium: 4.1 mmol/L (ref 3.5–5.1)
Sodium: 132 mmol/L — ABNORMAL LOW (ref 135–145)

## 2018-12-03 NOTE — Progress Notes (Addendum)
Speech Language Pathology Daily Session Note  Patient Details  Name: Barry Woods MRN: [id number] Date of Birth: [birth year]  Today's Date: 12/03/2018 SLP Individual Time: 2500-3704 SLP Individual Time Calculation (min): 43 min  Short Term Goals: Week 1: SLP Short Term Goal 1 (Week 1): Pt will consume therapeutic trials of regular textures with mod I use of swallowing precautions.   SLP Short Term Goal 2 (Week 1): Pt will utilize chin tuck in >90% of opportunities when consuming thin liquids with mod I.   SLP Short Term Goal 3 (Week 1): Pt will complete mildly complex daily tasks with min assist verbal cues for functional problem solving.   SLP Short Term Goal 4 (Week 1): Pt will recall mildly complex, daily information with min assist verbal cues for use of external aids.   SLP Short Term Goal 5 (Week 1): Pt will recognize and correct errors in the moment during functional tasks with min assist verbal cues.   SLP Short Term Goal 6 (Week 1): Pt will selectively attend to tasks in a mildly distracting environment for 15 minute intervals with min verbal cues for redirection to task.    Skilled Therapeutic Interventions:Skilled ST services focused on cognitive skills. SLP facilitated semi-complex problem solving skills utilizing basic to semi-complex money management pt required min A verbal cues for problem solving and mod-min A verbal cues for error awareness, however likely due to recognition of coins due to use of fake money. Pt required mod A verbal cues for working memory. SLP facilitated semi-complex problem solving skills utilzing scheduling task, pt required mod A verbal cues and supervision A verbal cues to scan left of midline. SLP facilitated PO consumption of thin vias straw, pt required supervision A verbal cues for use of swallow strategies, chin tuck and intermittent throat clear. Pt was left in room with call bell within reach and chair alarm set. SLP reccomends to continue skilled  services.      Pain Pain Assessment Pain Score: 0-No pain  Therapy/Group: Individual Therapy  Shantay Sonn  [facility] 12/03/2018, 9:43 AM

## 2018-12-03 NOTE — Progress Notes (Signed)
Physical Therapy Session Note  Patient Details  Name: Barry Woods MRN: [id number] Date of Birth: May 18, 1946  Today's Date: 12/03/2018 PT Individual Time: 1937-9024 PT Individual Time Calculation (min): 85 min   Short Term Goals: Week 1:  PT Short Term Goal 1 (Week 1): Patient will demonstrate dynamic standing balance with minguard A and single UE support. PT Short Term Goal 2 (Week 1): Patient will demonstrate bed to chair transfers consistently with min A.  PT Short Term Goal 3 (Week 1): Patient will ambulate 100' with min A with LRAD. PT Short Term Goal 4 (Week 1): Patient to demonstrate R side awareness by placing L foot appropriately for transfers at least 75% of the time with min gesturing cues. L PT Short Term Goal 5 (Week 1): Patient to negotiate 4 stairs with min A with railings.  Skilled Therapeutic Interventions/Progress Updates:  Pt was seen bedside in the pm. Pt transferred recliner to w/c with mod A and verbal cues. Pt propelled w/c about 150 feet with B LEs and S to occasional min A with verbal cues. Pt performed stand pivot transfer to Nustep with mod A and verbal cues. Pt rode Nustep x 10 minutes at level 3 with occasional brief rest breaks. Pt transferred bck to w/c with mod A and verbal cues. Pt performed toilet transfers with mod A and verbal cues. Pt returned to gym. Pt performed multiple sit to stand transfers in parallel bars with c/g and verbal cues. While standing treatment focused on upright posture, midline and weight shifting. Pt returned to room following treatment and left sitting up in w/c with seat belt alarm in place and call bell within reach.    Therapy Documentation Precautions:  Precautions Precautions: None Precaution Comments: left inattention Restrictions Weight Bearing Restrictions: No General:   Vital Signs:  Pain: No c/o pain,   Therapy/Group: Individual Therapy  Dub Amis 12/03/2018, 3:43 PM

## 2018-12-03 NOTE — Progress Notes (Signed)
Occupational Therapy Session Note  Patient Details  Name: Barry Woods MRN: [id number] Date of Birth: 10-Oct-1946  Today's Date: 12/03/2018 OT Individual Time: 1300-1400 OT Individual Time Calculation (min): 60 min   Short Term Goals: Week 1:  OT Short Term Goal 1 (Week 1): Pt will don shirt with supervision and min vcs for Lt side attendance  OT Short Term Goal 2 (Week 1): Pt will complete 1/3 steps of donning pants  OT Short Term Goal 3 (Week 1): Pt will complete 1 grooming task in standing to improve balance   Skilled Therapeutic Interventions/Progress Updates:    Pt greeted in recliner with family present. They left at start of session. Worked on [employer], standing balance, and attention during bathing and dressing sit<stand at sink from recliner. He required support at Lt elbow to wash Rt side. Throughout tx, focused on controlled release after grasping ADL items due to flexor synergy in hand. Mod A sit<stand for perihygiene and also for LB dressing. With OT setting up Lt hand grasp, pt able to elevate pants 50% over hips on Lt side. He required assist for donning gripper socks but able to assist with doffing Rt sock using L UE. Support at Lt elbow while washing hair with shower cap. Pt requires cues throughout for sustained attention to task and Lt attention. At end of session pt left via PT handoff.    Therapy Documentation Precautions:  Precautions Precautions: None Precaution Comments: left inattention Restrictions Weight Bearing Restrictions: No Pain: No c/o pain    ADL: ADL Eating: Moderate cueing, Contact guard Where Assessed-Eating: Edge of bed Upper Body Dressing: Minimal assistance Where Assessed-Upper Body Dressing: Edge of bed Lower Body Dressing: Maximal assistance Where Assessed-Lower Body Dressing: Edge of bed Toilet Transfer: Moderate assistance Toilet Transfer Method: Stand pivot      Therapy/Group: Individual Therapy  [name] 12/03/2018,  5:37 PM

## 2018-12-03 NOTE — Progress Notes (Signed)
[place] PHYSICAL MEDICINE & REHABILITATION PROGRESS NOTE   Subjective/Complaints: Patient seen sitting up in his chair this morning.  He states he did not sleep well overnight because he has his "days and nights mixed up".   ROS: Denies CP, SOB, N/V/D  Objective:   No results found. Recent Labs    12/02/18 0725  WBC 3.6*  HGB 13.3  HCT 39.8  PLT PLATELET CLUMPS NOTED ON SMEAR, COUNT APPEARS DECREASED   Recent Labs    12/02/18 0725 12/03/18 0534  NA 132* 132*  K 3.7 4.1  CL 102 104  CO2 21* 19*  GLUCOSE 87 90  BUN 13 14  CREATININE 1.10 1.02  CALCIUM 8.5* 8.5*    Intake/Output Summary (Last 24 hours) at 12/03/2018 0933 Last data filed at 12/02/2018 1730 Gross per 24 hour  Intake 480 ml  Output 200 ml  Net 280 ml     Physical Exam: Vital Signs Blood pressure (!) 152/96, pulse 88, temperature 99.2 F (37.3 C), temperature source Oral, resp. rate (!) 22, height 5\' 7"  (1.702 m), weight 77.9 kg, SpO2 95 %.  Constitutional: No distress . Vital signs reviewed. HENT: Normocephalic.  Atraumatic. Eyes: EOMI. No discharge. Cardiovascular: Irregularly irregular. No JVD. Respiratory: CTA Bilaterally. Normal effort. GI: BS +. Non-distended. Musc: No edema or tenderness in extremities. Neurologic: reasonable insight and awareness.  Dysarthria Motor: 5/5 in RIght  deltoid, bicep, tricep, grip, hip flexor, knee extensors, ankle dorsiflexor and plantar flexor LUE: 3-/5 delt, bi , tri. 2/5 grip, stable LLE:, 3-4/5 HF, KE, 2-3/5 ADF/PF, stable Skin: Warm and dry.  Intact.  Assessment/Plan: 1. 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  Physiatrist is providing close team supervision and 24 hour management of active medical problems listed below.  Physiatrist and rehab team continue to assess barriers to discharge/monitor patient progress toward  functional and medical goals  Care Tool:  Bathing  Bathing activity did not occur: (Not assessed)           Bathing assist       Upper Body Dressing/Undressing Upper body dressing   What is the patient wearing?: Pull over shirt    Upper body assist Assist Level: Minimal Assistance - Patient > 75%    Lower Body Dressing/Undressing Lower body dressing      What is the patient wearing?: Pants     Lower body assist Assist for lower body dressing: Maximal Assistance - Patient 25 - 49%     Toileting Toileting Toileting Activity did not occur (Clothing management and hygiene only): N/A (no void or bm)  Toileting assist Assist for toileting: Moderate Assistance - Patient 50 - 74%     Transfers Chair/bed transfer  Transfers assist     Chair/bed transfer assist level: Moderate Assistance - Patient 50 - 74%     Locomotion Ambulation   Ambulation assist      Assist level: Moderate Assistance - Patient 50 - 74% Assistive device: Walker-rolling Max distance: 45   Walk 10 feet activity   Assist     Assist level: Moderate Assistance - Patient - 50 - 74% Assistive device: Walker-rolling   Walk 50 feet activity   Assist Walk 50 feet with 2 turns activity did not occur: Safety/medical concerns         Walk 150 feet activity   Assist Walk 150 feet activity did not occur: Safety/medical concerns  Walk 10 feet on uneven surface  activity   Assist Walk 10 feet on uneven surfaces activity did not occur: Safety/medical concerns         Wheelchair     Assist Will patient use wheelchair at discharge?: No Type of Wheelchair: Manual    Wheelchair assist level: Minimal Assistance - Patient > 75% Max wheelchair distance: 100    Wheelchair 50 feet with 2 turns activity    Assist        Assist Level: Minimal Assistance - Patient > 75%   Wheelchair 150 feet activity     Assist Wheelchair 150 feet activity did not occur:  Safety/medical concerns         Medical Problem List and Plan: 1.Left side weakness with 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 DVT Prophylaxis/Anticoagulation: SCDs. Monitor for any signs of DVT 3. Pain Management:Tylenol as needed 4. Mood:Provide emotional support 5. Neuropsych: This patientiscapable of making decisions on hisown behalf. 6. Skin/Wound Care:Routine skin checks 7. Fluids/Electrolytes/Nutrition:encourage PO 8. Atrial fibrillation. ELIQUIS discontinued due to [place]. Cardiac rate controlled. Continue Tenormin 75 mg daily.   -outpt follow up with Dr. Angelena Form as needed and await plan for possible cardioversion  -looks like he's been on 20-40mg  of lasix daily in the past  -lasix 20mg  daily Filed Weights   12/01/18 2014  Weight: 77.9 kg   -increased potassium supplement to 20 meq bid   Labs ordered for Monday 9. Hyperlipidemia. Zocor 10. Rheumatoid arthritis. Patient on chronic prednisone as well as scheduled injections of Humira prior to admission. Will Discuss plan of care. 11.  Hypertension  Continue atenolol, Lasix  Elevated on 1/11  Will consider further increase if persistently elevated 12.  Hyponatremia  Sodium 132 on 1/11  Labs ordered for Monday   LOS: 2 days A FACE TO FACE EVALUATION WAS PERFORMED  Ankit Lorie Phenix 12/03/2018, 9:33 AM

## 2018-12-04 ENCOUNTER — Inpatient Hospital Stay (HOSPITAL_COMMUNITY): Payer: Medicare HMO

## 2018-12-04 MED ORDER — ONDANSETRON HCL 4 MG PO TABS
2.0000 mg | ORAL_TABLET | Freq: Three times a day (TID) | ORAL | Status: DC | PRN
  Administered 2018-12-04: 2 mg via ORAL
  Filled 2018-12-04: qty 1

## 2018-12-04 MED ORDER — ONDANSETRON HCL 4 MG PO TABS: 2.0000 mg | ORAL_TABLET | Freq: Once | ORAL | Status: DC

## 2018-12-04 NOTE — Progress Notes (Signed)
Physical Therapy Session Note  Patient Details  Name: Barry Woods MRN: [id number] Date of Birth: [birth year]  Today's Date: 12/04/2018 PT Individual Time: 1350-1400 PT Individual Time Calculation (min): 10 min  and Today's Date: 12/04/2018 PT Missed Time: 50 Minutes Missed Time Reason: Patient ill (Comment);Patient fatigue(nausea)  Short Term Goals: Week 1:  PT Short Term Goal 1 (Week 1): Patient will demonstrate dynamic standing balance with minguard A and single UE support. PT Short Term Goal 2 (Week 1): Patient will demonstrate bed to chair transfers consistently with min A.  PT Short Term Goal 3 (Week 1): Patient will ambulate 100' with min A with LRAD. PT Short Term Goal 4 (Week 1): Patient to demonstrate R side awareness by placing L foot appropriately for transfers at least 75% of the time with min gesturing cues. L PT Short Term Goal 5 (Week 1): Patient to negotiate 4 stairs with min A with railings.  Skilled Therapeutic Interventions/Progress Updates:    Pt in recliner upon PT arrival, pt reports feeling nauseous all day and reports that he can't get up/participate in therapist because of this. RN already provided pt with nausea medication. Therapist offered to help pt to bed, but pt declined. Pt requesting to use the bathroom, therapist providing set up assist for use of urinal this session. Therapist also provided pt with ginger ale at end of session to help with nausea, pt has been unable to eat lunch. Pt left in recliner at end of session with needs in reach and chair alarm set. Pt missed 50 minutes of skilled therapy tx this session secondary to nausea/fatigue.   Therapy Documentation Precautions:  Precautions Precautions: None Precaution Comments: left inattention Restrictions Weight Bearing Restrictions: No   Therapy/Group: Individual Therapy  Netta Corrigan, PT, DPT 12/04/2018, 8:25 AM

## 2018-12-04 NOTE — Progress Notes (Signed)
Pt reported being dizzy this am with nausea. Called on call for PRN. Nausea did finally resolve but pt lethargic. No c/o of chest pain, sweating, SOB.  Did not attend therapy this pm. Pt resting in recliner with belt alarm on and call bell in reach.

## 2018-12-04 NOTE — Progress Notes (Signed)
[place] PHYSICAL MEDICINE & REHABILITATION PROGRESS NOTE   Subjective/Complaints: Patient seen laying in bed this morning.  He states he slept well overnight.  States he hopes that his days and nights are now back to normal.   ROS: Denies CP, SOB, N/V/D  Objective:   No results found. Recent Labs    12/02/18 0725  WBC 3.6*  HGB 13.3  HCT 39.8  PLT PLATELET CLUMPS NOTED ON SMEAR, COUNT APPEARS DECREASED   Recent Labs    12/02/18 0725 12/03/18 0534  NA 132* 132*  K 3.7 4.1  CL 102 104  CO2 21* 19*  GLUCOSE 87 90  BUN 13 14  CREATININE 1.10 1.02  CALCIUM 8.5* 8.5*    Intake/Output Summary (Last 24 hours) at 12/04/2018 0753 Last data filed at 12/04/2018 0244 Gross per 24 hour  Intake 360 ml  Output 600 ml  Net -240 ml     Physical Exam: Vital Signs Blood pressure 120/81, pulse 81, temperature 97.7 F (36.5 C), temperature source Oral, resp. rate (!) 24, height 5\' 7"  (1.702 m), weight 76.7 kg, SpO2 94 %.  Constitutional: No distress . Vital signs reviewed. HENT: Normocephalic.  Atraumatic. Eyes: EOMI. No discharge. Cardiovascular: Irregularly irregular.  No JVD. Respiratory: CTA bilaterally.  Normal effort. GI: BS +. Non-distended. Musc: No edema or tenderness in extremities. Neurologic: reasonable insight and awareness.  Dysarthria Motor:  LUE: 3-/5 delt, bi , tri. 2/5 grip, unchanged LLE:, 4-/5 HF, KE, 3/5 ADF/PF Skin: Warm and dry.  Intact.  Assessment/Plan: 1. 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  Physiatrist is providing close team supervision and 24 hour management of active medical problems listed below.  Physiatrist and rehab team continue to assess barriers to discharge/monitor patient progress toward functional and medical goals  Care Tool:  Bathing  Bathing activity did not occur: (Not assessed) Body parts  bathed by patient: Left arm, Chest, Abdomen, Front perineal area, Buttocks, Right upper leg, Left upper leg, Face   Body parts bathed by helper: Right arm, Right lower leg, Left lower leg     Bathing assist Assist Level: Moderate Assistance - Patient 50 - 74%     Upper Body Dressing/Undressing Upper body dressing   What is the patient wearing?: Pull over shirt    Upper body assist Assist Level: Minimal Assistance - Patient > 75%    Lower Body Dressing/Undressing Lower body dressing      What is the patient wearing?: Pants, Incontinence brief     Lower body assist Assist for lower body dressing: Maximal Assistance - Patient 25 - 49%     Toileting Toileting Toileting Activity did not occur (Clothing management and hygiene only): N/A (no void or bm)  Toileting assist Assist for toileting: Moderate Assistance - Patient 50 - 74%     Transfers Chair/bed transfer  Transfers assist     Chair/bed transfer assist level: Moderate Assistance - Patient 50 - 74%     Locomotion Ambulation   Ambulation assist      Assist level: Moderate Assistance - Patient 50 - 74% Assistive device: Walker-rolling Max distance: 45   Walk 10 feet activity   Assist     Assist level: Moderate Assistance - Patient - 50 - 74% Assistive device: Walker-rolling   Walk 50 feet activity   Assist Walk 50 feet with 2 turns activity did not occur: Safety/medical concerns  Walk 150 feet activity   Assist Walk 150 feet activity did not occur: Safety/medical concerns         Walk 10 feet on uneven surface  activity   Assist Walk 10 feet on uneven surfaces activity did not occur: Safety/medical concerns         Wheelchair     Assist Will patient use wheelchair at discharge?: No Type of Wheelchair: Manual    Wheelchair assist level: Minimal Assistance - Patient > 75% Max wheelchair distance: 150    Wheelchair 50 feet with 2 turns activity    Assist         Assist Level: Minimal Assistance - Patient > 75%   Wheelchair 150 feet activity     Assist Wheelchair 150 feet activity did not occur: Safety/medical concerns   Assist Level: Minimal Assistance - Patient > 75%     Medical Problem List and Plan: 1.Left side weakness with 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 DVT Prophylaxis/Anticoagulation: SCDs. Monitor for any signs of DVT 3. Pain Management:Tylenol as needed 4. Mood:Provide emotional support 5. Neuropsych: This patientiscapable of making decisions on hisown behalf. 6. Skin/Wound Care:Routine skin checks 7. Fluids/Electrolytes/Nutrition:encourage PO 8. Atrial fibrillation. ELIQUIS discontinued due to [place]. Cardiac rate controlled. Continue Tenormin 75 mg daily.   -outpt follow up with Dr. Angelena Form as needed and await plan for possible cardioversion  -looks like he's been on 20-40mg  of lasix daily in the past  -lasix 20mg  daily Filed Weights   12/01/18 2014 12/04/18 0507  Weight: 77.9 kg 76.7 kg   -increased potassium supplement to 20 meq bid   Labs ordered for tomorrow 9. Hyperlipidemia. Zocor 10. Rheumatoid arthritis. Patient on chronic prednisone as well as scheduled injections of Humira prior to admission. Will Discuss plan of care. 11.  Hypertension  Continue atenolol, Lasix  Controlled on 1/12 12.  Hyponatremia  Sodium 132 on 1/11  Labs ordered for tomorrow   LOS: 3 days A FACE TO FACE EVALUATION WAS PERFORMED  Zira Helinski Lorie Phenix 12/04/2018, 7:53 AM

## 2018-12-05 ENCOUNTER — Inpatient Hospital Stay (HOSPITAL_COMMUNITY): Payer: Medicare HMO | Admitting: Physical Therapy

## 2018-12-05 ENCOUNTER — Inpatient Hospital Stay (HOSPITAL_COMMUNITY): Payer: Medicare HMO | Admitting: Occupational Therapy

## 2018-12-05 ENCOUNTER — Inpatient Hospital Stay (HOSPITAL_COMMUNITY): Payer: Medicare HMO | Admitting: Speech Pathology

## 2018-12-05 DIAGNOSIS — I4821 Permanent atrial fibrillation: Secondary | ICD-10-CM

## 2018-12-05 LAB — BASIC METABOLIC PANEL
Anion gap: 8 (ref 5–15)
BUN: 16 mg/dL (ref 8–23)
CO2: 22 mmol/L (ref 22–32)
CREATININE: 1.21 mg/dL (ref 0.61–1.24)
Calcium: 8.4 mg/dL — ABNORMAL LOW (ref 8.9–10.3)
Chloride: 99 mmol/L (ref 98–111)
GFR calc Af Amer: >60 mL/min (ref >60)
GFR calc non Af Amer: >60 mL/min (ref >60)
GLUCOSE: 119 mg/dL — AB (ref 70–99)
Potassium: 4 mmol/L (ref 3.5–5.1)
Sodium: 129 mmol/L — ABNORMAL LOW (ref 135–145)

## 2018-12-05 NOTE — Progress Notes (Signed)
Occupational Therapy Session Note  Patient Details  Name: Barry Woods MRN: [id number] Date of Birth: 1946-07-24  Today's Date: 12/05/2018 OT Individual Time: 1100-1205 OT Individual Time Calculation (min): 65 min    Short Term Goals: Week 1:  OT Short Term Goal 1 (Week 1): Pt will don shirt with supervision and min vcs for Lt side attendance  OT Short Term Goal 2 (Week 1): Pt will complete 1/3 steps of donning pants  OT Short Term Goal 3 (Week 1): Pt will complete 1 grooming task in standing to improve balance   Skilled Therapeutic Interventions/Progress Updates:    Patient seated in w/c, flat affect with occ smiles, denies pain, inattention to left side and cues needed for attention to task t/o session.  Max A to don and tie shoes.  SPT to/from w/c and mat table mod A and cues for posture, weight shift and to avoid pushing to the left.  Unsupported sitting on mat table with CS and ongoing cues for posture.  Completed weight shifts/core exercises/trunk stretching and reaching activities to promote midline and symmetry.  L UE AAROM shoulder to hand (proximal mobility and strength greater than distal) AROM noted t/o.  Completed L UE reaching task - able to use lateral pinch to hold bean bags but has difficulty with release.  Visual matching activity without difficulty. Reviewed goals for therapy, activities that family can encourage and UE positioning with patient and brother in law - patient with poor carryover but brother in law with good understanding to assist with reinforcement.   Returned to room, remained in w/c with seat belt alarm set  Therapy Documentation Precautions:  Precautions Precautions: None Precaution Comments: left inattention Restrictions Weight Bearing Restrictions: No General:   Vital Signs:   Pain: Pain Assessment Pain Scale: 0-10 Pain Score: 0-No pain     Therapy/Group: Individual Therapy  Carlos Levering 12/05/2018, 12:14 PM

## 2018-12-05 NOTE — Progress Notes (Signed)
Physical Therapy Session Note  Patient Details  Name: Barry Woods MRN: [id number] Date of Birth: [birth year]  Today's Date: 12/05/2018 PT Individual Time: 2703-5009 PT Individual Time Calculation (min): 75 min   Short Term Goals: Week 1:  PT Short Term Goal 1 (Week 1): Patient will demonstrate dynamic standing balance with minguard A and single UE support. PT Short Term Goal 2 (Week 1): Patient will demonstrate bed to chair transfers consistently with min A.  PT Short Term Goal 3 (Week 1): Patient will ambulate 100' with min A with LRAD. PT Short Term Goal 4 (Week 1): Patient to demonstrate R side awareness by placing L foot appropriately for transfers at least 75% of the time with min gesturing cues. L PT Short Term Goal 5 (Week 1): Patient to negotiate 4 stairs with min A with railings.  Skilled Therapeutic Interventions/Progress Updates:    Pt received seated in w/c in room, agreeable to PT. Dependent transport via w/c to therapy gym for time conservation. Stand pivot transfer w/c to mat table with mod A to the R. Sit to stand with mod A to RW. Ambulation x 5 ft with RW with L hand orthosis, max A for standing balance as pt pushes to the L and needs assist to advance LLE. Pt fatigues quickly with gait and needs to sit after 5 ft. Pt reports feeling more fatigued this date. Standing weight shift L/R in standing with mod multimodal cueing to initiate weight shift with RW and L hand orthosis for balance. Nustep level 3 x 5 min with BLE only, LLE strap for improved alignment. Squat pivot transfer w/c to recliner with mod A. Pt requests to use urinal, setup A for use. Pt left seated in recliner in room with needs in reach at end of therapy session, quick release belt and chair alarm in place.  Therapy Documentation Precautions:  Precautions Precautions: None Precaution Comments: left inattention Restrictions Weight Bearing Restrictions: No Pain: Pain Assessment Pain Scale: 0-10 Pain  Score: 0-No pain    Therapy/Group: Individual Therapy  Excell Seltzer, PT, DPT  12/05/2018, 3:35 PM

## 2018-12-05 NOTE — Progress Notes (Signed)
Speech Language Pathology Daily Session Note  Patient Details  Name: Barry Woods MRN: [id number] Date of Birth: Feb 19, 1946  Today's Date: 12/05/2018 SLP Individual Time: 0830-0930 SLP Individual Time Calculation (min): 60 min  Short Term Goals: Week 1: SLP Short Term Goal 1 (Week 1): Pt will consume therapeutic trials of regular textures with mod I use of swallowing precautions.   SLP Short Term Goal 2 (Week 1): Pt will utilize chin tuck in >90% of opportunities when consuming thin liquids with mod I.   SLP Short Term Goal 3 (Week 1): Pt will complete mildly complex daily tasks with min assist verbal cues for functional problem solving.   SLP Short Term Goal 4 (Week 1): Pt will recall mildly complex, daily information with min assist verbal cues for use of external aids.   SLP Short Term Goal 5 (Week 1): Pt will recognize and correct errors in the moment during functional tasks with min assist verbal cues.   SLP Short Term Goal 6 (Week 1): Pt will selectively attend to tasks in a mildly distracting environment for 15 minute intervals with min verbal cues for redirection to task.    Skilled Therapeutic Interventions:  Skilled treatment session focused on dysphagia and communication goals. SLP facilitiated session by providing skilled observation of pt consuming thin liquids via straw. Pt required Mod A faded to Min A cues for use of strategy. Pt intermittently swallowed prior to chin tuck with immediate cough. Also of note, pt with intermittent wet voice throughout session and required Mod A cues to clear throat. Pt consumed minimal amount of muffin with no s/s of oropharyngeal dysphagia. SLP further faciltated sesison by providing Min A verbal cues and demonstration of speech intelligibility strategie. Pt able to achieve > 90% intelligibility at the sentence level. Of note, pt unable to locate his cell phone on left of visual field. Pt was left upright in bed, bed alarm on and all needs  within reach. Continue per current plan of care.      Pain Pain Assessment Pain Scale: 0-10 Pain Score: 0-No pain  Therapy/Group: Individual Therapy  Rusell Meneely 12/05/2018, 12:44 PM

## 2018-12-05 NOTE — Progress Notes (Signed)
Pt very weak when we got him up to go to the bathroom. Typically a +1 stand and pivot, needed a second person and a stedy to assist. Assessed pt, vital signs were within defined limits. Pt states "I did not eat much today because I was nauseas and missed a therapy session".

## 2018-12-05 NOTE — Progress Notes (Signed)
[facility] PHYSICAL MEDICINE & REHABILITATION PROGRESS NOTE   Subjective/Complaints: RN notes mention patient was nauseas last night. Denies any problems this morning although sleeping soundly when I came in.   ROS: Limited due to cognitive/behavioral    Objective:   No results found. No results for input(s): WBC, HGB, HCT, PLT in the last 72 hours. Recent Labs    12/03/18 0534  NA 132*  K 4.1  CL 104  CO2 19*  GLUCOSE 90  BUN 14  CREATININE 1.02  CALCIUM 8.5*    Intake/Output Summary (Last 24 hours) at 12/05/2018 0911 Last data filed at 12/04/2018 1700 Gross per 24 hour  Intake 342 ml  Output -  Net 342 ml     Physical Exam: Vital Signs Blood pressure 114/68, pulse 61, temperature 97.8 F (36.6 C), temperature source Oral, resp. rate 20, height 5\' 7"  (1.702 m), weight 76 kg, SpO2 98 %.  Constitutional: No distress . Vital signs reviewed. HEENT: EOMI, oral membranes moist Neck: supple Cardiovascular: RRR without murmur. No JVD    Respiratory: CTA Bilaterally without wheezes or rales. Normal effort    GI: BS +, non-tender, non-distended  Musc: No edema or tenderness in extremities. Neurologic: awoke fairly easily. Follows commands. Remembered me from Friday Dysarthric Motor:  LUE: 3-/5 delt, bi , tri. 2/5 grip, essentially stable LLE:, 4-/5 HF, KE, 3/5 ADF/PF--unchanged Senses pain in all 4's Skin: Warm and dry.  Intact.  Assessment/Plan: 1. 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  Physiatrist is providing close team supervision and 24 hour management of active medical problems listed below.  Physiatrist and rehab team continue to assess barriers to discharge/monitor patient progress toward functional and medical goals  Care Tool:  Bathing  Bathing activity did not occur: (Not assessed) Body parts bathed by patient: Left arm,  Chest, Abdomen, Front perineal area, Buttocks, Right upper leg, Left upper leg, Face   Body parts bathed by helper: Right arm, Right lower leg, Left lower leg     Bathing assist Assist Level: Moderate Assistance - Patient 50 - 74%     Upper Body Dressing/Undressing Upper body dressing   What is the patient wearing?: Pull over shirt    Upper body assist Assist Level: Minimal Assistance - Patient > 75%    Lower Body Dressing/Undressing Lower body dressing      What is the patient wearing?: Pants, Incontinence brief     Lower body assist Assist for lower body dressing: Maximal Assistance - Patient 25 - 49%     Toileting Toileting Toileting Activity did not occur (Clothing management and hygiene only): N/A (no void or bm)  Toileting assist Assist for toileting: Moderate Assistance - Patient 50 - 74%     Transfers Chair/bed transfer  Transfers assist     Chair/bed transfer assist level: Moderate Assistance - Patient 50 - 74%     Locomotion Ambulation   Ambulation assist      Assist level: Moderate Assistance - Patient 50 - 74% Assistive device: Walker-rolling Max distance: 45   Walk 10 feet activity   Assist     Assist level: Moderate Assistance - Patient - 50 - 74% Assistive device: Walker-rolling   Walk 50 feet activity   Assist Walk 50 feet with 2 turns activity did not occur: Safety/medical concerns         Walk 150 feet activity   Assist Walk 150  feet activity did not occur: Safety/medical concerns         Walk 10 feet on uneven surface  activity   Assist Walk 10 feet on uneven surfaces activity did not occur: Safety/medical concerns         Wheelchair     Assist Will patient use wheelchair at discharge?: No Type of Wheelchair: Manual    Wheelchair assist level: Minimal Assistance - Patient > 75% Max wheelchair distance: 150    Wheelchair 50 feet with 2 turns activity    Assist        Assist Level: Minimal  Assistance - Patient > 75%   Wheelchair 150 feet activity     Assist Wheelchair 150 feet activity did not occur: Safety/medical concerns   Assist Level: Minimal Assistance - Patient > 75%     Medical Problem List and Plan: 1.Left side weakness with 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 DVT Prophylaxis/Anticoagulation: SCDs. Monitor for any signs of DVT 3. Pain Management:Tylenol as needed 4. Mood:Provide emotional support 5. Neuropsych: This patientiscapable of making decisions on hisown behalf. 6. Skin/Wound Care:Routine skin checks 7. Fluids/Electrolytes/Nutrition:encourage PO  -nausea yesterday---observe today, labs pending 8. Atrial fibrillation. ELIQUIS discontinued due to [place]. Cardiac rate controlled. Continue Tenormin 75 mg daily.   -outpt follow up with Dr. Angelena Form as needed and await plan for possible cardioversion  -looks like he's been on 20-40mg  of lasix daily in the past  -continue lasix 20mg  daily Filed Weights   12/01/18 2014 12/04/18 0507 12/05/18 0429  Weight: 77.9 kg 76.7 kg 76 kg   -increased potassium supplement to 20 meq bid  -weights sl decreased  -no SOB at present 9. Hyperlipidemia. Zocor 10. Rheumatoid arthritis. Patient on chronic prednisone as well as scheduled injections of Humira prior to admission. Will Discuss plan of care. 11.  Hypertension  Continue atenolol, Lasix  Controlled on 1/13 12.  Hyponatremia  Sodium 132 on 1/11  Labs pending today.    LOS: 4 days A FACE TO FACE EVALUATION WAS PERFORMED  Meredith Staggers 12/05/2018, 9:11 AM

## 2018-12-06 ENCOUNTER — Inpatient Hospital Stay (HOSPITAL_COMMUNITY): Payer: Medicare HMO | Admitting: Physical Therapy

## 2018-12-06 ENCOUNTER — Inpatient Hospital Stay (HOSPITAL_COMMUNITY): Payer: Medicare HMO | Admitting: Occupational Therapy

## 2018-12-06 ENCOUNTER — Inpatient Hospital Stay (HOSPITAL_COMMUNITY): Payer: Medicare HMO

## 2018-12-06 LAB — URINALYSIS, COMPLETE (UACMP) WITH MICROSCOPIC
BACTERIA UA: NONE SEEN
Bilirubin Urine: NEGATIVE
Glucose, UA: NEGATIVE mg/dL
Hgb urine dipstick: NEGATIVE
Ketones, ur: NEGATIVE mg/dL
Leukocytes, UA: NEGATIVE
Nitrite: NEGATIVE
Protein, ur: NEGATIVE mg/dL
Specific Gravity, Urine: 1.01 (ref 1.005–1.030)
pH: 5 (ref 5.0–8.0)

## 2018-12-06 LAB — SODIUM, URINE, RANDOM: Sodium, Ur: 68 mmol/L

## 2018-12-06 MED ORDER — POTASSIUM CHLORIDE CRYS ER 20 MEQ PO TBCR
20.0000 meq | EXTENDED_RELEASE_TABLET | Freq: Every day | ORAL | Status: DC
  Administered 2018-12-07 – 2018-12-10 (×4): 20 meq via ORAL
  Filled 2018-12-06 (×4): qty 1

## 2018-12-06 NOTE — Progress Notes (Signed)
[facility] PHYSICAL MEDICINE & REHABILITATION PROGRESS NOTE   Subjective/Complaints: Patient presents   concerns to me over blood in his urine.  He states he had clots a few days ago and now his urine is just pink-colored.  Nursing is reporting yellow/clear-colored urine.  He feels it might be related to how urinal was set next to his penis a few days ago.  ROS: Patient denies fever, rash, sore throat, blurred vision, nausea, vomiting, diarrhea, cough, shortness of breath or chest pain, joint or back pain, headache, or mood change.   Objective:   No results found. No results for input(s): WBC, HGB, HCT, PLT in the last 72 hours. Recent Labs    12/05/18 0930  NA 129*  K 4.0  CL 99  CO2 22  GLUCOSE 119*  BUN 16  CREATININE 1.21  CALCIUM 8.4*    Intake/Output Summary (Last 24 hours) at 12/06/2018 0910 Last data filed at 12/06/2018 0725 Gross per 24 hour  Intake 120 ml  Output -  Net 120 ml     Physical Exam: Vital Signs Blood pressure 123/80, pulse 81, temperature 98 F (36.7 C), temperature source Oral, resp. rate (!) 21, height 5\' 7"  (1.702 m), weight 74.8 kg, SpO2 95 %.  Constitutional: No distress . Vital signs reviewed. HEENT: EOMI, oral membranes moist Neck: supple Cardiovascular: RRR without murmur. No JVD    Respiratory: CTA Bilaterally without wheezes or rales. Normal effort    GI: BS +, non-tender, non-distended  Musc: No edema or tenderness in extremities. Uro: has smudges of what appears to be blood tinged urine (pink) on diaper Neurologic: awoke fairly easily. Follows commands. Remembered me from Friday Dysarthric Motor:  LUE: 3-/5 delt, bi , tri. 2/5 grip, essentially stable LLE:, 4-/5 HF, KE, 3/5 ADF/PF--unchanged Senses pain in all 4's Skin: Warm and dry.  Intact.  Assessment/Plan: 1. Functional deficits secondary to right basal ganglia hemorrhage which require 3+ hours per day of interdisciplinary therapy in a comprehensive inpatient rehab  setting.  Physiatrist is providing close team supervision and 24 hour management of active medical problems listed below.  Physiatrist and rehab team continue to assess barriers to discharge/monitor patient progress toward functional and medical goals  Care Tool:  Bathing  Bathing activity did not occur: (Not assessed) Body parts bathed by patient: Left arm, Chest, Abdomen, Front perineal area, Buttocks, Right upper leg, Left upper leg, Face   Body parts bathed by helper: Right arm, Right lower leg, Left lower leg     Bathing assist Assist Level: Moderate Assistance - Patient 50 - 74%     Upper Body Dressing/Undressing Upper body dressing   What is the patient wearing?: Pull over shirt    Upper body assist Assist Level: Minimal Assistance - Patient > 75%    Lower Body Dressing/Undressing Lower body dressing      What is the patient wearing?: Pants, Incontinence brief     Lower body assist Assist for lower body dressing: Maximal Assistance - Patient 25 - 49%     Toileting Toileting Toileting Activity did not occur (Clothing management and hygiene only): N/A (no void or bm)  Toileting assist Assist for toileting: Moderate Assistance - Patient 50 - 74%     Transfers Chair/bed transfer  Transfers assist     Chair/bed transfer assist level: Moderate Assistance - Patient 50 - 74%     Locomotion Ambulation   Ambulation assist      Assist level: Moderate Assistance - Patient 50 - 74%  Assistive device: Walker-rolling Max distance: 5'   Walk 10 feet activity   Assist     Assist level: Moderate Assistance - Patient - 50 - 74% Assistive device: Walker-rolling   Walk 50 feet activity   Assist Walk 50 feet with 2 turns activity did not occur: Safety/medical concerns         Walk 150 feet activity   Assist Walk 150 feet activity did not occur: Safety/medical concerns         Walk 10 feet on uneven surface  activity   Assist Walk 10 feet on  uneven surfaces activity did not occur: Safety/medical concerns         Wheelchair     Assist Will patient use wheelchair at discharge?: No Type of Wheelchair: Manual    Wheelchair assist level: Minimal Assistance - Patient > 75% Max wheelchair distance: 150    Wheelchair 50 feet with 2 turns activity    Assist        Assist Level: Minimal Assistance - Patient > 75%   Wheelchair 150 feet activity     Assist Wheelchair 150 feet activity did not occur: Safety/medical concerns   Assist Level: Minimal Assistance - Patient > 75%     Medical Problem List and Plan: 1.Left side weakness with 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 DVT Prophylaxis/Anticoagulation: SCDs. Monitor for any signs of DVT 3. Pain Management:Tylenol as needed 4. Mood:Provide emotional support 5. Neuropsych: This patientiscapable of making decisions on hisown behalf. 6. Skin/Wound Care:Routine skin checks 7. Fluids/Electrolytes/Nutrition:encourage PO  -nausea yesterday--seems resolved 8. Atrial fibrillation. ELIQUIS discontinued due to [place]. Cardiac rate controlled. Continue Tenormin 75 mg daily.   -outpt follow up with Dr. Angelena Form as needed and await plan for possible cardioversion  -looks like he's been on 20-40mg  of lasix daily in the past  -Does not appear volume overloaded.  Given drop in sodium yesterday, will hold Lasix for the time being [facility] Main Weights   12/04/18 0507 12/05/18 0429 12/06/18 0501  Weight: 76.7 kg 76 kg 74.8 kg   -Adjust potassium back to 20 mEq daily  -weights sl decreased  -no SOB at present 9. Hyperlipidemia. Zocor 10. Rheumatoid arthritis. Patient on chronic prednisone as well as scheduled injections of Humira prior to admission. Will Discuss plan of care. 11.  Hypertension  Continue atenolol,  Lasix  Controlled on 1/13 12.  Hyponatremia  129 on 1/13  Holding diuretic, follow serially.  -check urine sodium 13. ?mild gross hematuria  -will check ua and ucx   LOS: 5 days A FACE TO FACE EVALUATION WAS PERFORMED  Meredith Staggers 12/06/2018, 9:10 AM

## 2018-12-06 NOTE — Progress Notes (Signed)
Speech Language Pathology Daily Session Note  Patient Details  Name: Barry Woods MRN: [id number] Date of Birth: [birth year]  Today's Date: 12/06/2018 SLP Individual Time: 1115-1209 SLP Individual Time Calculation (min): 54 min  Short Term Goals: Week 1: SLP Short Term Goal 1 (Week 1): Pt will consume therapeutic trials of regular textures with mod I use of swallowing precautions.   SLP Short Term Goal 2 (Week 1): Pt will utilize chin tuck in >90% of opportunities when consuming thin liquids with mod I.   SLP Short Term Goal 3 (Week 1): Pt will complete mildly complex daily tasks with min assist verbal cues for functional problem solving.   SLP Short Term Goal 4 (Week 1): Pt will recall mildly complex, daily information with min assist verbal cues for use of external aids.   SLP Short Term Goal 5 (Week 1): Pt will recognize and correct errors in the moment during functional tasks with min assist verbal cues.   SLP Short Term Goal 6 (Week 1): Pt will selectively attend to tasks in a mildly distracting environment for 15 minute intervals with min verbal cues for redirection to task.    Skilled Therapeutic Interventions:Skilled ST services focused on swallow and cognitive skills. SLP facilitated semi-complex problem solving and error awareness skills utilizing organization task, pt required mod A fade to min A verbal cues and mod-min A verbal cues for error awareness/correction. SLP facilitated problem solving dialing kitchen and placing meal order, pt required initial mod A verbal cues to dial number and navigated menu/placed order with supervision A verbal cues. SLP facilitated utilization of swallow strategies, chin tuck and intermittent throat clear with thin via straw, pt required initial mod A verbal cues fading to supervision A verbals cues. Pt demonstrated intermittent wet vocal quality without PO consumption, pt stated due to drainage from nasal cavity, pt wiped nose several times during  session. SLP provided education to increase intermittent throat clear without PO consumption to reduce pharyngeal residue, pt stated agreement.   Pt was left in room with call bell within reach and chair alarm set. SLP reccomends to continue skilled services.     Pain Pain Assessment Pain Scale: 0-10 Pain Score: 0-No pain  Therapy/Group: Individual Therapy  Yarianna Varble  [facility] 12/06/2018, 12:18 PM

## 2018-12-06 NOTE — Progress Notes (Signed)
Physical Therapy Session Note  Patient Details  Name: Barry Woods MRN: [id number] Date of Birth: May 14, 1946  Today's Date: 12/06/2018 PT Individual Time: 1000-1100 PT Individual Time Calculation (min): 60 min   Short Term Goals: Week 1:  PT Short Term Goal 1 (Week 1): Patient will demonstrate dynamic standing balance with minguard A and single UE support. PT Short Term Goal 2 (Week 1): Patient will demonstrate bed to chair transfers consistently with min A.  PT Short Term Goal 3 (Week 1): Patient will ambulate 100' with min A with LRAD. PT Short Term Goal 4 (Week 1): Patient to demonstrate R side awareness by placing L foot appropriately for transfers at least 75% of the time with min gesturing cues. L PT Short Term Goal 5 (Week 1): Patient to negotiate 4 stairs with min A with railings.  Skilled Therapeutic Interventions/Progress Updates:    Pt received seated in w/c in room, agreeable to PT session. No complaints of pain. Sit to stand with min A to RW. Ambulation 4 x 15' with RW and mod A for balance and to advance LLE. Trial gait with L PLS AFO, pt exhibits improved LLE clearance with use of AFO. Trial gait with L AFO and shoe cover, pt exhibits improved LLE advancement and clearance. Pt exhibits some impulsivity with gait and has multiple instances of near LOB anteriorly due to not fulling advancing LLE prior to taking another step with RLE. Pt performs stand pivot transfers with no AD and min A this session. Toilet transfer with mod A with use of grab bar, dependent for clothing management. Pt left seated in w/c in room with needs in reach, quick release belt and chair alarm in place.  Therapy Documentation Precautions:  Precautions Precautions: None Precaution Comments: left inattention Restrictions Weight Bearing Restrictions: No Pain: Pain Assessment Pain Scale: 0-10 Pain Score: 0-No pain   Therapy/Group: Individual Therapy   Excell Seltzer, PT, DPT  12/06/2018, 12:08  PM

## 2018-12-06 NOTE — Progress Notes (Signed)
Occupational Therapy Session Note  Patient Details  Name: Barry Woods MRN: [id number] Date of Birth: May 10, 1946  Today's Date: 12/06/2018 OT Individual Time: 4259-5638 OT Individual Time Calculation (min): 80 min    Short Term Goals: Week 1:  OT Short Term Goal 1 (Week 1): Pt will don shirt with supervision and min vcs for Lt side attendance  OT Short Term Goal 2 (Week 1): Pt will complete 1/3 steps of donning pants  OT Short Term Goal 3 (Week 1): Pt will complete 1 grooming task in standing to improve balance   Skilled Therapeutic Interventions/Progress Updates:    Patient with nursing upon arrival.  Patient denies pain, is alert and talkative today.  Improved focus on task and recall noted t/o session today.   Assisted to toilet with steady - CG for sit to stand in steady.  Shower on bath bench completed w/ steady for transfer.  Bathing:  A for buttocks, R UE and B LEs.  Toileting/CM:  Max A in steady.  UB dressing:  Min A, LB dressing:  Mod A, Footwear:  Max A.  Patient continues with lateral lean to the left but able to correct with cues.  Completed left UE AROM exercises with improved OH reach and hand/digit extension noted today - attempted to hold wash cloth with some success.  Patient remained in w/c at close of session with seat belt alarm set and left UE supported.  Therapy Documentation Precautions:  Precautions Precautions: None Precaution Comments: left inattention Restrictions Weight Bearing Restrictions: No General:   Vital Signs:  Pain: Pain Assessment Pain Scale: 0-10 Pain Score: 0-No pain ADL:      Therapy/Group: Individual Therapy  Carlos Levering 12/06/2018, 12:05 PM

## 2018-12-06 NOTE — Progress Notes (Signed)
Occupational Therapy Session Note  Patient Details  Name: Barry Woods MRN: [id number] Date of Birth: 26-Oct-1946  Today's Date: 12/06/2018 OT Individual Time: 7409-7964 OT Individual Time Calculation (min): 28 min    Short Term Goals: Week 1:  OT Short Term Goal 1 (Week 1): Pt will don shirt with supervision and min vcs for Lt side attendance  OT Short Term Goal 2 (Week 1): Pt will complete 1/3 steps of donning pants  OT Short Term Goal 3 (Week 1): Pt will complete 1 grooming task in standing to improve balance   Skilled Therapeutic Interventions/Progress Updates:    Pt received sitting up in recliner with no c/o pain. Pt completed stand pivot transfer with min A. Pt requested to use NuStep during session. Pt completed SPT to NuStep with min A toward L. A strap was used to secure L UE to handle to promote L UE involvement and shoulder flexibility/strengthening. Intermittent vc for pt to increase intensity while pedaling for 8 min. Pt returned to his room and completed SPT to recliner with min A. Chair alarm set and all needs met.   Therapy Documentation Precautions:  Precautions Precautions: None Precaution Comments: left inattention Restrictions Weight Bearing Restrictions: No   Vital Signs: Therapy Vitals Temp: 98.3 F (36.8 C) Temp Source: Oral Pulse Rate: 74 Resp: 18 BP: 132/86 Patient Position (if appropriate): Sitting Oxygen Therapy SpO2: 100 % O2 Device: Room Air Pain: Pain Assessment Pain Scale: 0-10 Pain Score: 0-No pain   Therapy/Group: Individual Treatment  Curtis Sites 12/06/2018, 3:19 PM

## 2018-12-06 NOTE — Progress Notes (Signed)
Occupational Therapy Session Note  Patient Details  Name: Barry Woods MRN: [id number] Date of Birth: Jun 27, 1946  Today's Date: 12/06/2018 OT Individual Time: 1600-1630 OT Individual Time Calculation (min): 30 min    Short Term Goals: Week 1:  OT Short Term Goal 1 (Week 1): Pt will don shirt with supervision and min vcs for Lt side attendance  OT Short Term Goal 2 (Week 1): Pt will complete 1/3 steps of donning pants  OT Short Term Goal 3 (Week 1): Pt will complete 1 grooming task in standing to improve balance   Skilled Therapeutic Interventions/Progress Updates:    Treatment session with focus on Lt attention and LUE NMR.  Pt received upright in recliner agreeable to therapy session.  Engaged in reaching with BUE for ball with focus on Lt shoulder flexion and elbow extension to reach to place ball in to basketball hoop.  Mod multimodal cues to visually attend to LUE during reaching activity and hand over hand assist for improved positioning of LUE during reaching.  Increased challenge to incorporate reaching towards Lt.  Utilized personal small football (stressball) with focus on grasp and release.  Incorporated reaching across midline and away from body with cues to continue to visually attend to LUE during reaching.  Educated pt and pt's family (daughter and brother in law) about functional tasks they can encourage pt to complete with LUE.  Pt remained upright in recliner with seat belt alarm on and all needs in reach.  Therapy Documentation Precautions:  Precautions Precautions: None Precaution Comments: left inattention Restrictions Weight Bearing Restrictions: No General:   Vital Signs: Therapy Vitals Temp: 98.3 F (36.8 C) Temp Source: Oral Pulse Rate: 74 Resp: 18 BP: 132/86 Patient Position (if appropriate): Sitting Oxygen Therapy SpO2: 100 % O2 Device: Room Air Pain: Pain Assessment Pain Scale: 0-10 Pain Score: 0-No pain   Therapy/Group: Individual  Therapy  Simonne Come 12/06/2018, 4:35 PM

## 2018-12-06 NOTE — Care Management (Signed)
[facility] Individual Statement of Services  Patient Name:  FREELAND PRACHT  Date:  12/06/2018  Welcome to the [facility].  Our goal is to provide you with an individualized program based on your diagnosis and situation, designed to meet your specific needs.  With this comprehensive rehabilitation program, you will be expected to participate in at least 3 hours of rehabilitation therapies Monday-Friday, with modified therapy programming on the weekends.  Your rehabilitation program will include the following services:  Physical Therapy (PT), Occupational Therapy (OT), Speech Therapy (ST), 24 hour per day rehabilitation nursing, Therapeutic Recreaction (TR), Neuropsychology, Case Management (Social Worker), Rehabilitation Medicine, Nutrition Services and Pharmacy Services  Weekly team conferences will be held on Tuesdays to discuss your progress.  Your Social Worker will talk with you frequently to get your input and to update you on team discussions.  Team conferences with you and your family in attendance may also be held.  Expected length of stay: 3 weeks   Overall anticipated outcome: supervision  Depending on your progress and recovery, your program may change. Your Social Worker will coordinate services and will keep you informed of any changes. Your Social Worker's name and contact numbers are listed  below.  The following services may also be recommended but are not provided by the [facility] will be made to provide these services after discharge if needed.  Arrangements include referral to agencies that provide these services.  Your insurance has been verified to be:  [employer] Your primary doctor is:  D[employer]omatic [employer] operational officer  Pertinent information will be shared with your doctor and your insurance  company.  Social Worker:  [place], [place] or ([phone]   Information discussed with and copy given to patient by: Lennart Pall, 12/06/2018, 5:36 PM

## 2018-12-07 ENCOUNTER — Inpatient Hospital Stay (HOSPITAL_COMMUNITY): Payer: Medicare HMO | Admitting: Physical Therapy

## 2018-12-07 ENCOUNTER — Inpatient Hospital Stay (HOSPITAL_COMMUNITY): Payer: Medicare HMO

## 2018-12-07 ENCOUNTER — Inpatient Hospital Stay (HOSPITAL_COMMUNITY): Payer: Medicare HMO | Admitting: Occupational Therapy

## 2018-12-07 ENCOUNTER — Telehealth: Payer: Self-pay | Admitting: Cardiovascular Disease

## 2018-12-07 LAB — BASIC METABOLIC PANEL
Anion gap: 7 (ref 5–15)
BUN: 17 mg/dL (ref 8–23)
CHLORIDE: 101 mmol/L (ref 98–111)
CO2: 23 mmol/L (ref 22–32)
Calcium: 8.6 mg/dL — ABNORMAL LOW (ref 8.9–10.3)
Creatinine, Ser: 1.12 mg/dL (ref 0.61–1.24)
GFR calc Af Amer: >60 mL/min (ref >60)
GFR calc non Af Amer: >60 mL/min (ref >60)
Glucose, Bld: 98 mg/dL (ref 70–99)
Potassium: 4 mmol/L (ref 3.5–5.1)
SODIUM: 131 mmol/L — AB (ref 135–145)

## 2018-12-07 LAB — URINE CULTURE: Culture: NO GROWTH

## 2018-12-07 MED ORDER — TRAZODONE HCL 50 MG PO TABS
50.0000 mg | ORAL_TABLET | Freq: Every evening | ORAL | Status: DC | PRN
  Administered 2018-12-07 – 2018-12-19 (×13): 50 mg via ORAL
  Filled 2018-12-07 (×15): qty 1

## 2018-12-07 NOTE — Progress Notes (Signed)
[place] PHYSICAL MEDICINE & REHABILITATION PROGRESS NOTE   Subjective/Complaints: Patient transferring from bed to recliner with staff.  I assisted.  Patient with fair night.  Reported some discomfort with urination.  Denies any problems with shortness of breath or coughing.  ROS: Patient denies fever, rash, sore throat, blurred vision, nausea, vomiting, diarrhea, cough, shortness of breath or chest pain, joint or back pain, headache, or mood change.   Objective:   No results found. No results for input(s): WBC, HGB, HCT, PLT in the last 72 hours. Recent Labs    12/05/18 0930 12/07/18 0525  NA 129* 131*  K 4.0 4.0  CL 99 101  CO2 22 23  GLUCOSE 119* 98  BUN 16 17  CREATININE 1.21 1.12  CALCIUM 8.4* 8.6*    Intake/Output Summary (Last 24 hours) at 12/07/2018 0900 Last data filed at 12/07/2018 0700 Gross per 24 hour  Intake 360 ml  Output -  Net 360 ml     Physical Exam: Vital Signs Blood pressure (!) 160/106, pulse (!) 150, temperature 98.5 F (36.9 C), temperature source Oral, resp. rate 19, height 5\' 7"  (1.702 m), weight 75.6 kg, SpO2 96 %.  Constitutional: No distress . Vital signs reviewed. HEENT: EOMI, oral membranes moist Neck: supple Cardiovascular: RRR with murmur---HR in 80's to 90's on my exam Respiratory: CTA Bilaterally without wheezes or rales. Normal effort    GI: BS +, non-tender, non-distended  Musc: No edema or tenderness in extremities. Uro: has smudges of what appears to be blood tinged urine (pink) on diaper Neurologic: awake, alert. Language normal.  Motor:  LUE: 3-/5 delt, bi , tri. 2/5 grip, stable, needs cues to attend to this side LLE:, 4-/5 HF, KE, 3/5 ADF/PF--stable Senses pain in all 4's Skin: Warm and dry.  Intact.  Assessment/Plan: 1. 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  Physiatrist is providing  close team supervision and 24 hour management of active medical problems listed below.  Physiatrist and rehab team continue to assess barriers to discharge/monitor patient progress toward functional and medical goals  Care Tool:  Bathing  Bathing activity did not occur: (Not assessed) Body parts bathed by patient: Left arm, Chest, Abdomen, Front perineal area, Right upper leg, Left upper leg, Face   Body parts bathed by helper: Right arm, Right lower leg, Left lower leg, Buttocks     Bathing assist Assist Level: Moderate Assistance - Patient 50 - 74%     Upper Body Dressing/Undressing Upper body dressing   What is the patient wearing?: Pull over shirt    Upper body assist Assist Level: Minimal Assistance - Patient > 75%    Lower Body Dressing/Undressing Lower body dressing      What is the patient wearing?: Pants, Incontinence brief     Lower body assist Assist for lower body dressing: Moderate Assistance - Patient 50 - 74%     Toileting Toileting Toileting Activity did not occur (Clothing management and hygiene only): N/A (no void or bm)  Toileting assist Assist for toileting: Moderate Assistance - Patient 50 - 74%     Transfers Chair/bed transfer  Transfers assist     Chair/bed transfer assist level: Minimal Assistance - Patient > 75%     Locomotion Ambulation   Ambulation assist      Assist level: Moderate Assistance - Patient 50 - 74% Assistive device: Walker-rolling Max distance: 15'   Walk 10  feet activity   Assist     Assist level: Moderate Assistance - Patient - 50 - 74% Assistive device: Walker-rolling   Walk 50 feet activity   Assist Walk 50 feet with 2 turns activity did not occur: Safety/medical concerns         Walk 150 feet activity   Assist Walk 150 feet activity did not occur: Safety/medical concerns         Walk 10 feet on uneven surface  activity   Assist Walk 10 feet on uneven surfaces activity did not occur:  Safety/medical concerns         Wheelchair     Assist Will patient use wheelchair at discharge?: No Type of Wheelchair: Manual    Wheelchair assist level: Minimal Assistance - Patient > 75% Max wheelchair distance: 150    Wheelchair 50 feet with 2 turns activity    Assist        Assist Level: Minimal Assistance - Patient > 75%   Wheelchair 150 feet activity     Assist Wheelchair 150 feet activity did not occur: Safety/medical concerns   Assist Level: Minimal Assistance - Patient > 75%     Medical Problem List and Plan: 1.Left side weakness with 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 DVT Prophylaxis/Anticoagulation: SCDs. Monitor for any signs of DVT 3. Pain Management:Tylenol as needed 4. Mood:Provide emotional support 5. Neuropsych: This patientiscapable of making decisions on hisown behalf. 6. Skin/Wound Care:Routine skin checks 7. Fluids/Electrolytes/Nutrition:encourage PO  -I personally reviewed the patient's labs today.   8. Atrial fibrillation. ELIQUIS discontinued due to [place]. Cardiac rate controlled. Continue Tenormin 75 mg daily.   -on exam this morning he was rate   controlled. Now recorded HR of 150+ per CNA  -BP elevated as well  -will check EKG, re-check VS/HR check manually  -cardiology was consulted this morning to follow up as well  -upon review of outpt chart, pt on 20-40mg  lasix at home at times for volume mgt  -I held lasix yesterday due to sodium of 129  -weights holding steady Filed Weights   12/05/18 0429 12/06/18 0501 12/07/18 0545  Weight: 76 kg 74.8 kg 75.6 kg   -continue potassium 20 mEq daily  -no SOB at present 9. Hyperlipidemia. Zocor 10. Rheumatoid arthritis. Patient on chronic prednisone as well as scheduled injections of Humira prior to admission. Will Discuss plan of care. 11.  Hypertension  Continue  atenolol, Lasix  Controlled on 1/13 12.  Hyponatremia  129 on 1/13  Holding diuretic---131 1/15  -urine sodium sl elevated 13. ?mild gross hematuria  -ua negative, ucx pending   LOS: 6 days A FACE TO [place] 12/07/2018, 9:00 AM

## 2018-12-07 NOTE — Progress Notes (Signed)
Social Work Patient ID: Barry Woods, male   DOB: 02-Jul-1946, 73 y.o.   MRN: [id number]  Met with pt and daughters yesterday following team conference.  All aware and agreeable with targeted d/c date of 12/22/18 and supervision goals overall.  No concerns/ questions at this time except for concern pt may not be getting adequate sleep.  Continue to follow.  Vladislav Axelson, LCSW

## 2018-12-07 NOTE — Progress Notes (Signed)
Speech Language Pathology Daily Session Note  Patient Details  Name: MYKELL RAWL MRN: [id number] Date of Birth: [birth year]  Today's Date: 12/07/2018 SLP Individual Time: 1500-1530 SLP Individual Time Calculation (min): 30 min  Short Term Goals: Week 1: SLP Short Term Goal 1 (Week 1): Pt will consume therapeutic trials of regular textures with mod I use of swallowing precautions.   SLP Short Term Goal 2 (Week 1): Pt will utilize chin tuck in >90% of opportunities when consuming thin liquids with mod I.   SLP Short Term Goal 3 (Week 1): Pt will complete mildly complex daily tasks with min assist verbal cues for functional problem solving.   SLP Short Term Goal 4 (Week 1): Pt will recall mildly complex, daily information with min assist verbal cues for use of external aids.   SLP Short Term Goal 5 (Week 1): Pt will recognize and correct errors in the moment during functional tasks with min assist verbal cues.   SLP Short Term Goal 6 (Week 1): Pt will selectively attend to tasks in a mildly distracting environment for 15 minute intervals with min verbal cues for redirection to task.    Skilled Therapeutic Interventions: 1# Skilled ST services focused on swallow and cognitive skills. Pt expressed fatigue due to little sleep obtained last night, but agreeable to participate in skilled ST services SLP facilitated PO consumption of thin via straw pt required min A verbal cues to utilize chin tuck, and mod for use of intermittent throat clear, however vocal quality improved from yesterday's session. SLP facilitated semi-complex problem solving, error awareness utilizing mental math questions, pt required mod A verbal cues for problem solving and mod-min A for error awareness/correction, likely due to impairment in working memory. Pt demonstrated mod-min A problem solving skills utilizing external aids such as a calculator. Pt requested to end treatment due to fatigue and agreement to finished missed 15  minutes at lunch time. SLP assisted pt in repositioning in recliner chair. Pt was left in room with call bell within reach and chair alarm set. SLP reccomends to continue skilled services.  2# Skilled ST services focused on swallow skills. SLP facilitated PO consumption of thin via straw, pt required mod A verbal cues for use of swallow strategies, however in a moderately distracting environment with visitor present. Pt was left in room with call bell within reach and chair alarm set. SLP reccomends to continue skilled services.  3# Skilled ST services focused on swallow and cognitive skills. SLP facilitated PO consumption of thin via straw, pt required min A verbal cues for use of swallow strategies. SLP facilitated basic problem solving skills utilizing novel card task, pt required min A fade to supervision A verbal cues for problem solving and min A verbal cues for recall of rules.  Pt was left in room with call bell within reach and chair alarm set. SLP reccomends to continue skilled services.     Pain Pain Assessment Pain Score: 0-No pain  Therapy/Group: Individual Therapy  Ina Scrivens  [facility] 12/07/2018, 3:37 PM

## 2018-12-07 NOTE — Progress Notes (Signed)
Physical Therapy Session Note  Patient Details  Name: Barry Woods MRN: [id number] Date of Birth: Apr 20, 1946  Today's Date: 12/07/2018 PT Individual Time: 1415-1500 PT Individual Time Calculation (min): 45 min   Short Term Goals: Week 1:  PT Short Term Goal 1 (Week 1): Patient will demonstrate dynamic standing balance with minguard A and single UE support. PT Short Term Goal 2 (Week 1): Patient will demonstrate bed to chair transfers consistently with min A.  PT Short Term Goal 3 (Week 1): Patient will ambulate 100' with min A with LRAD. PT Short Term Goal 4 (Week 1): Patient to demonstrate R side awareness by placing L foot appropriately for transfers at least 75% of the time with min gesturing cues. L PT Short Term Goal 5 (Week 1): Patient to negotiate 4 stairs with min A with railings.  Skilled Therapeutic Interventions/Progress Updates:    Pt received seated in recliner in room, agreeable to PT. Pt reports some L hip pain at rest, declines intervention. Pt also reports not sleeping well last night and that he has not been moving as well this date. Sit to stand with mod A to RW. Stand pivot transfer recliner to w/c with mod A with RW and increased multimodal cueing required this date for moving LLE and for safety as pt attempts to sit before w/c is fully behind him. Squat pivot transfer w/c to mat table with min A. Sitting balance EOM reaching outside BOS with alt UE with min A to maintain sitting balance. Seated L lateral leans onto 2 pillow for UE WBing with focus on tricep ext and core activation to return to midline. Pt requests to use the bathroom. Squat pivot transfer mat table to w/c with mod A. Transfer w/c to toilet with mod A, dependent for clothing management. Stedy transfer toilet to w/c due to pt fatigue level. Pt is max A for clothing management and pericare. Pt is setup A to brush teeth while seated in w/c at sink. Handoff to SLP at end of session.  Therapy  Documentation Precautions:  Precautions Precautions: None Precaution Comments: left inattention Restrictions Weight Bearing Restrictions: No    Therapy/Group: Individual Therapy   Excell Seltzer, PT, DPT  12/07/2018, 4:00 PM

## 2018-12-07 NOTE — Progress Notes (Signed)
Social Work Patient ID: Barry Woods, male   DOB: 04-Feb-1946, 73 y.o.   MRN: [id number]  Pt's daughter, Barry Woods, called this morning and continues to be concerned about pt's poor sleep and requests that MD/PA speak with him about taking something to aide in better rest.  Spoke with Marlowe Shores, PA about her concerns.  Tehani Mersman, LCSW

## 2018-12-07 NOTE — Progress Notes (Signed)
Occupational Therapy Session Note  Patient Details  Name: Barry Woods MRN: [id number] Date of Birth: Mar 26, 1946  Today's Date: 12/07/2018 OT Individual Time: 1016-1030 OT Individual Time Calculation (min): 14 min  and Today's Date: 12/07/2018 OT Missed Time: 59 Minutes Missed Time Reason: Patient fatigue   Short Term Goals: Week 1:  OT Short Term Goal 1 (Week 1): Pt will don shirt with supervision and min vcs for Lt side attendance  OT Short Term Goal 2 (Week 1): Pt will complete 1/3 steps of donning pants  OT Short Term Goal 3 (Week 1): Pt will complete 1 grooming task in standing to improve balance   Skilled Therapeutic Interventions/Progress Updates:    Upon entering the room, pt sleeping in recliner chair. Pt mumbling to therapist but not able to carry conversation or open eyes full. Pt apparently did not sleep well last night and is very lethargic at this time. OT attempting to walk pt for participation but unable to fully awaken. Pt missing 45 minutes of skilled OT intervention.   Therapy Documentation Precautions:  Precautions Precautions: None Precaution Comments: left inattention Restrictions Weight Bearing Restrictions: No General: General OT Amount of Missed Time: 45 Minutes Vital Signs: Therapy Vitals Pulse Rate: 78 Resp: 18 BP: 106/60 Oxygen Therapy SpO2: 99 % O2 Device: Room Air Pain: Pain Assessment Pain Score: 0-No pain ADL: ADL Eating: Moderate cueing, Contact guard Where Assessed-Eating: Edge of bed Upper Body Dressing: Minimal assistance Where Assessed-Upper Body Dressing: Edge of bed Lower Body Dressing: Maximal assistance Where Assessed-Lower Body Dressing: Edge of bed Toilet Transfer: Moderate assistance Toilet Transfer Method: Stand pivot Vision   Perception    Praxis   Exercises:   Other Treatments:     Therapy/Group: Individual Therapy  Gypsy Decant 12/07/2018, 10:32 AM

## 2018-12-07 NOTE — Telephone Encounter (Signed)
New message   Per Linna Hoff family just wanted you to know that the patient is in the hospital.

## 2018-12-07 NOTE — Progress Notes (Signed)
Occupational Therapy Session Note  Patient Details  Name: HALEEM HANNER MRN: [id number] Date of Birth: 09-14-1946  Today's Date: 12/07/2018 OT Individual Time: 1540-0867 OT Individual Time Calculation (min): 27 min    Short Term Goals: Week 1:  OT Short Term Goal 1 (Week 1): Pt will don shirt with supervision and min vcs for Lt side attendance  OT Short Term Goal 2 (Week 1): Pt will complete 1/3 steps of donning pants  OT Short Term Goal 3 (Week 1): Pt will complete 1 grooming task in standing to improve balance   Skilled Therapeutic Interventions/Progress Updates:    1:1. Pt sitting in recliner, just received breakfast. OT sets up tray with needed items on L for L attention and provides mod fading to min VC for swallow strategies as well as small sips. Pt eats cup of ice cream with cup placedin LUE with HOH A to facilitate gross grasp around cup. Pt easily distracted by TV d/t decreased attention deficits, however reports "not having trouble focusing." Exited session with pt seated in recliner, call light in reach and belt alarm on  Therapy Documentation Precautions:  Precautions Precautions: None Precaution Comments: left inattention Restrictions Weight Bearing Restrictions: No   Therapy/Group: Individual Therapy  Tonny Branch 12/07/2018, 8:30 AM

## 2018-12-07 NOTE — Progress Notes (Signed)
When taking patient to toilet, noticed small blood clots in his brief. His brief was wet as well. While on the toilet, noted some blood in the commode & when his penis was wiped. No c/o pain. No trauma noted to the area. Communication put in for the provider for evaluation on next round. Will continue to monitor.

## 2018-12-07 NOTE — Patient Care Conference (Addendum)
Inpatient RehabilitationTeam Conference and Plan of Care Update Date: 12/06/2018   Time: 3:30 PM    Patient Name: Barry Woods      Medical Record Number: [id number]  Date of Birth: 1946-10-08 Sex: Male         Room/Bed: 4W17C/4W17C-01 Payor Info: Payor: [employer] MEDICARE / Plan: [employer] MEDICARE HMO/PPO / Product Type: *No Product type* /    Admitting Diagnosis: RT Basal ganglia hem  Admit Date/Time:  12/01/2018  5:17 PM Admission Comments: No comment available   Primary Diagnosis:  <principal problem not specified> Principal Problem: <principal problem not specified>  Patient Active Problem List   Diagnosis Date Noted  . Hemorrhagic stroke ([place])   . Hyponatremia   . Dyslipidemia   . Tachypnea   . AKI (acute kidney injury) ([place])   . Cerebral cavernoma   . ICH (intracerebral hemorrhage) ([place]) 11/27/2018  . Diastolic CHF, chronic ([place]) 11/09/2018  . Atrial fibrillation ([place]) 11/07/2018  . Dyspnea on exertion 10/31/2018  . Coronary artery calcification seen on CT scan 10/31/2018  . Routine general medical examination at a health care facility 03/09/2018  . Benign essential tremor 02/16/2017  . Pure hypercholesterolemia 02/16/2017  . ILD (interstitial lung disease) ([place]) 10/21/2016  . BPH associated with nocturia 09/24/2015  . Rheumatoid arthritis ([place]) 09/28/2011  . Shoulder pain, bilateral 06/16/2011  . Gout attack 02/02/2011  . 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 LIVER FUNCTION TESTS, ABNORMAL, HX OF 11/19/2008  . Hyperlipidemia, mild 11/01/2007  . Essential hypertension 11/01/2007    Expected Discharge Date: Expected Discharge Date: 12/22/18  Team Members Present: Physician leading conference: Dr. Alger Simons Social Worker Present: Lennart Pall, LCSW Nurse Present: Dorthula Nettles, RN PT Present: Other (comment)(Taylor Ervin Knack, PT) OT Present: Other (comment)(Sandra Rosana Hoes, OT) SLP Present: Charolett Bumpers, SLP PPS Coordinator present : Gunnar Fusi     Current  Status/Progress Goal Weekly Team Focus  Medical   Patient admitted to rehab on January 9 with right-sided intracranial hemorrhage and associated left hemiparesis and visual spatial deficits.  Course complicated by atrial fibrillation.  Patient has had to come off his Eliquis.  Is we are also dealing with hypo-natremia  Stabilize patient medically to maximize participation in therapies  Mild hematuria, ongoing hyponatremia, nutrition, stroke education   Bowel/Bladder   Continent of bowel and bladder; episodes of incontiences at night; LBM 01/14  min assist  assist with tolieting prn    Swallow/Nutrition/ Hydration   Min-Supervision, chin tuck/intermittent throat clear, dys 3 and thin  Mod I  carryover of swallow strategies and regular trials    ADL's   UB dressing/bathing - min A, LB dressing/bathing max A, bathroom transfers with steady - CG sit to stand in steady, cont with lateral lean to left when unsupported, improved left UE mobility noted  (S) level overall  midline orientation in unsupported sitting with CS and occ cues, ADL training, NMRE L UE   Mobility   mod A for transfers, max A for short distance gait with RW  Supervision overall  L NMR, gait training, transfers   Communication   Min-supervision A  Mod I  speech intelligibility stratgeies in conversation   Safety/Cognition/ Behavioral Observations  Mod-Min A  Sup A  Semi-complex problem solving, error awareness, selective attention, left attention, recall strategies    Pain   no c/o pain   maintain pain level of 0  assess pain q shift and prn    Skin   skin tear on left arm; rash on buttocks  and lower back  maintain improvement on current skin issure; maintian free of any new skin infection/breakdown   assess skin q shfit and prn     Rehab Goals Patient on target to meet rehab goals: Yes *See Care Plan and progress notes for long and short-term goals.     Barriers to Discharge  Current Status/Progress Possible  Resolutions Date Resolved   Physician    Medical stability        See detailed problem list in today's medical note      Nursing                  PT                    OT                  SLP                SW                Discharge Planning/Teaching Needs:  Pt to d/c to his own townhome with daughters, sister and private duty care providing 24/7 assistance.  Family also making home modifications.  Teaching to be planned with caregivers closer to d/c.   Team Discussion:  Left hemparesis;  Await UA culture due to urine discoloration.  Min-mod assist tfs;  amb ~ 95' with mod assist and trialing an AFO.  Min - max with ADLs.  Supervision goals overall.  ST addressing swallow strategies, inattention and recall issues.   Revisions to Treatment Plan:  NA    Continued Need for Acute Rehabilitation Level of Care: The patient requires daily medical management by a physician with specialized training in physical medicine and rehabilitation for the following conditions: Daily direction of a multidisciplinary physical rehabilitation program to ensure safe treatment while eliciting the highest outcome that is of practical value to the patient.: Yes Daily medical management of patient stability for increased activity during participation in an intensive rehabilitation regime.: Yes Daily analysis of laboratory values and/or radiology reports with any subsequent need for medication adjustment of medical intervention for : Neurological problems;Blood pressure problems;Urological problems;Cardiac problems;Other   I attest that I was present, lead the team conference, and concur with the assessment and plan of the team.   Ocia Simek 12/07/2018, 3:23 PM

## 2018-12-08 ENCOUNTER — Inpatient Hospital Stay (HOSPITAL_COMMUNITY): Payer: Medicare HMO | Admitting: Occupational Therapy

## 2018-12-08 ENCOUNTER — Inpatient Hospital Stay (HOSPITAL_COMMUNITY): Payer: Medicare HMO | Admitting: Physical Therapy

## 2018-12-08 ENCOUNTER — Inpatient Hospital Stay (HOSPITAL_COMMUNITY): Payer: Medicare HMO | Admitting: Speech Pathology

## 2018-12-08 NOTE — Progress Notes (Signed)
Occupational Therapy Session Note  Patient Details  Name: Barry Woods MRN: [id number] Date of Birth: 1946/02/10  Today's Date: 12/08/2018 OT Individual Time: 1000-1059 OT Individual Time Calculation (min): 59 min    Short Term Goals: Week 1:  OT Short Term Goal 1 (Week 1): Pt will don shirt with supervision and min vcs for Lt side attendance  OT Short Term Goal 2 (Week 1): Pt will complete 1/3 steps of donning pants  OT Short Term Goal 3 (Week 1): Pt will complete 1 grooming task in standing to improve balance   Skilled Therapeutic Interventions/Progress Updates:    Upon entering the room, pt seated in wheelchair with no c/o pain and agreeable to OT intervention. Pt propelled wheelchair with hemiplegic technique 100' to day room with supervision and mod cuing for technique. Skilled OT intervention with focus on NMR for L UE. Pt engaged  Resistive peg board activity with use of L UE only. Pt grasping 5 pegs and placing into board but unable to manipulate peg in hand once grasped. Very difficult for pt to release peg from hand. Finger isolation exercises and pt able to complete with all digits 1-3 but unable to extend  4-5 digits. Pt unable to extend these digits when grasping pegs as well. Pt given foam cubes to pick up, move to L side of table, and then focus on opening to release from hand. Pt returning to room in same manner as above. Pt remained in wheelchair with chair alarm donned and call bell within reach.   Therapy Documentation Precautions:  Precautions Precautions: None Precaution Comments: left inattention Restrictions Weight Bearing Restrictions: No ADL: ADL Eating: Moderate cueing, Contact guard Where Assessed-Eating: Edge of bed Upper Body Dressing: Minimal assistance Where Assessed-Upper Body Dressing: Edge of bed Lower Body Dressing: Maximal assistance Where Assessed-Lower Body Dressing: Edge of bed Toilet Transfer: Moderate assistance Toilet Transfer Method:  Stand pivot   Therapy/Group: Individual Therapy  Gypsy Decant 12/08/2018, 12:38 PM

## 2018-12-08 NOTE — Progress Notes (Signed)
Physical Therapy Session Note  Patient Details  Name: Barry Woods MRN: [id number] Date of Birth: 06-Feb-1946  Today's Date: 12/08/2018 PT Individual Time: 1130-1208; 1415-1500 PT Individual Time Calculation (min): 38 min and 45 min  Short Term Goals: Week 1:  PT Short Term Goal 1 (Week 1): Patient will demonstrate dynamic standing balance with minguard A and single UE support. PT Short Term Goal 2 (Week 1): Patient will demonstrate bed to chair transfers consistently with min A.  PT Short Term Goal 3 (Week 1): Patient will ambulate 100' with min A with LRAD. PT Short Term Goal 4 (Week 1): Patient to demonstrate R side awareness by placing L foot appropriately for transfers at least 75% of the time with min gesturing cues. L PT Short Term Goal 5 (Week 1): Patient to negotiate 4 stairs with min A with railings.  Skilled Therapeutic Interventions/Progress Updates:  Session 1: Pt received seated in w/c in room, agreeable to PT. Pt reports some soreness in L hip at rest, declines intervention. Sit to stand with CGA to RW. Ambulation 3 x 30 ft with RW and L AFO with min A for balance, shoe cover for improved LLE clearance. Pt requires mod tactile and verbal cueing to advance LLE prior to taking another step with RLE. Pt tends to continue stepping with RLE before LLE has been advanced and has one instance of near LOB anteriorly that he requires max A to correct to prevent fall. Toilet transfer with mod A with use of RW, dependent for clothing management. Pt left seated in w/c in care of NT for lunch.  Session 2: Pt received seated in recliner in room, agreeable to PT. No complaints of pain. Set pt up with more narrow w/c for better fit and with more comfortable cushion. Stand pivot transfer recliner to w/c with min A and RW, max cueing for LLE management. Adjusted leg rests for improved fit. Sidesteps L/R with RW and min A with cueing for sequencing and initiating movement with LLE. Set pt up with  new L hand orthosis on RW for improved comfort as well as improved grip on RW with gait and transfer. Forward/backward amb with RW and min to mod A for balance, cueing for LLE management. Pt left seated in w/c in room with needs in reach, quick release belt and chair alarm in place.    Therapy Documentation Precautions:  Precautions Precautions: None Precaution Comments: left inattention Restrictions Weight Bearing Restrictions: No   Therapy/Group: Individual Therapy   Excell Seltzer, PT, DPT  12/08/2018, 12:13 PM

## 2018-12-08 NOTE — Progress Notes (Signed)
[facility] PHYSICAL MEDICINE & REHABILITATION PROGRESS NOTE   Subjective/Complaints: Up in chair. Says he slept better. Reports some blood clots in urine--RN notes confirms.   ROS: Patient denies fever, rash, sore throat, blurred vision, nausea, vomiting, diarrhea, cough, shortness of breath or chest pain, joint or back pain, headache, or mood change.   Objective:   No results found. No results for input(s): WBC, HGB, HCT, PLT in the last 72 hours. Recent Labs    12/07/18 0525  NA 131*  K 4.0  CL 101  CO2 23  GLUCOSE 98  BUN 17  CREATININE 1.12  CALCIUM 8.6*    Intake/Output Summary (Last 24 hours) at 12/08/2018 0943 Last data filed at 12/08/2018 0820 Gross per 24 hour  Intake 720 ml  Output 150 ml  Net 570 ml     Physical Exam: Vital Signs Blood pressure (!) 147/91, pulse 74, temperature 97.7 F (36.5 C), temperature source Oral, resp. rate 17, height 5\' 7"  (1.702 m), weight 75.6 kg, SpO2 92 %.  Constitutional: No distress . Vital signs reviewed. HEENT: EOMI, oral membranes moist Neck: supple Cardiovascular: RRR without murmur. No JVD    Respiratory: CTA Bilaterally without wheezes or rales. Normal effort    GI: BS +, non-tender, non-distended  Uro: penis normal in appearance.  Neurologic: awake, alert. Language normal.  Motor:  LUE: 3-/5 delt, bi , tri. 2/5 grip, stable, needs cues to attend to this side LLE:, 4-/5 HF, KE, 3/5 ADF/PF--stable Senses pain in all 4's Skin: Warm and dry.  Intact.  Assessment/Plan: 1. 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  Physiatrist is providing close team supervision and 24 hour management of active medical problems listed below.  Physiatrist and rehab team continue to assess barriers to discharge/monitor patient progress toward functional and medical goals  Care Tool:  Bathing  Bathing activity  did not occur: (Not assessed) Body parts bathed by patient: Left arm, Chest, Abdomen, Front perineal area, Right upper leg, Left upper leg, Face   Body parts bathed by helper: Right arm, Right lower leg, Left lower leg, Buttocks     Bathing assist Assist Level: Moderate Assistance - Patient 50 - 74%     Upper Body Dressing/Undressing Upper body dressing   What is the patient wearing?: Pull over shirt    Upper body assist Assist Level: Minimal Assistance - Patient > 75%    Lower Body Dressing/Undressing Lower body dressing      What is the patient wearing?: Pants, Incontinence brief     Lower body assist Assist for lower body dressing: Moderate Assistance - Patient 50 - 74%     Toileting Toileting Toileting Activity did not occur (Clothing management and hygiene only): N/A (no void or bm)  Toileting assist Assist for toileting: Moderate Assistance - Patient 50 - 74%     Transfers Chair/bed transfer  Transfers assist     Chair/bed transfer assist level: Moderate Assistance - Patient 50 - 74%     Locomotion Ambulation   Ambulation assist      Assist level: Moderate Assistance - Patient 50 - 74% Assistive device: Walker-rolling Max distance: 15'   Walk 10 feet activity   Assist     Assist level: Moderate Assistance - Patient - 50 - 74% Assistive device: Walker-rolling   Walk 50 feet activity   Assist Walk 50 feet with 2 turns activity did not occur: Safety/medical  concerns         Walk 150 feet activity   Assist Walk 150 feet activity did not occur: Safety/medical concerns         Walk 10 feet on uneven surface  activity   Assist Walk 10 feet on uneven surfaces activity did not occur: Safety/medical concerns         Wheelchair     Assist Will patient use wheelchair at discharge?: No Type of Wheelchair: Manual    Wheelchair assist level: Minimal Assistance - Patient > 75% Max wheelchair distance: 150    Wheelchair 50  feet with 2 turns activity    Assist        Assist Level: Minimal Assistance - Patient > 75%   Wheelchair 150 feet activity     Assist Wheelchair 150 feet activity did not occur: Safety/medical concerns   Assist Level: Minimal Assistance - Patient > 75%     Medical Problem List and Plan: 1.Left side weakness with 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 DVT Prophylaxis/Anticoagulation: SCDs. Monitor for any signs of DVT 3. Pain Management:Tylenol as needed 4. Mood:Provide emotional support 5. Neuropsych: This patientiscapable of making decisions on hisown behalf. 6. Skin/Wound Care:Routine skin checks 7. Fluids/Electrolytes/Nutrition:encourage PO  -encourage po.   8. Atrial fibrillation. ELIQUIS discontinued due to [place]. Cardiac rate controlled. Continue Tenormin 75 mg daily.   -HR controlled. BP recorded after activity this morning  -cardiology was consulted per request of family  -upon review of outpt chart, pt on 20-40mg  lasix at home at times for volume mgt  -lasix held due to hyponatremia  -weights holding steady Filed Weights   12/06/18 0501 12/07/18 0545 12/08/18 0748  Weight: 74.8 kg 75.6 kg 75.6 kg   -continue potassium 20 mEq daily  -no SOB at present 9. Hyperlipidemia. Zocor 10. Rheumatoid arthritis. Patient on chronic prednisone as well as scheduled injections of Humira prior to admission. Will Discuss plan of care. 11.  Hypertension  Continue atenolol, Lasix  Controlled on 1/13 12.  Hyponatremia  129 on 1/13  Holding diuretic---improved to 131 1/15  -urine sodium sl elevated  -recheck bmet tomorrow 13. ?mild gross hematuria  -ua negative, ucx negative  -will investigate to see if he has any hematuria history  -continue to observe for now   LOS: 7 days A FACE TO [place] 12/08/2018, 9:43 AM

## 2018-12-08 NOTE — Progress Notes (Addendum)
Speech Language Pathology Weekly Progress and Session Note  Patient Details  Name: Barry Woods MRN: [id number] Date of Birth: 03/02/1946  Beginning of progress report period: December 02, 2018 End of progress report period: December 08, 2018  Today's Date: 12/08/2018 SLP Individual Time: 0830-0930 SLP Individual Time Calculation (min): 60 min  Short Term Goals: Week 1: SLP Short Term Goal 1 (Week 1): Pt will consume therapeutic trials of regular textures with mod I use of swallowing precautions.   SLP Short Term Goal 1 - Progress (Week 1): Not met SLP Short Term Goal 2 (Week 1): Pt will utilize chin tuck in >90% of opportunities when consuming thin liquids with mod I.   SLP Short Term Goal 2 - Progress (Week 1): Not met SLP Short Term Goal 3 (Week 1): Pt will complete mildly complex daily tasks with min assist verbal cues for functional problem solving.   SLP Short Term Goal 3 - Progress (Week 1): Not met SLP Short Term Goal 4 (Week 1): Pt will recall mildly complex, daily information with min assist verbal cues for use of external aids.   SLP Short Term Goal 4 - Progress (Week 1): Not met SLP Short Term Goal 5 (Week 1): Pt will recognize and correct errors in the moment during functional tasks with min assist verbal cues.   SLP Short Term Goal 5 - Progress (Week 1): Not met SLP Short Term Goal 6 (Week 1): Pt will selectively attend to tasks in a mildly distracting environment for 15 minute intervals with min verbal cues for redirection to task.   SLP Short Term Goal 6 - Progress (Week 1): Not met    New Short Term Goals: Week 2: SLP Short Term Goal 1 (Week 2): Pt will consume therapeutic trials of regular textures with supervision cues for use of swallowing precautions.   SLP Short Term Goal 2 (Week 2): Pt will consume trials of regular with supervision cues for use of swallow strategies.  SLP Short Term Goal 3 (Week 2): Pt will complete mildly complex daily tasks with min assist  verbal cues for functional problem solving.   SLP Short Term Goal 4 (Week 2): Pt will recall mildly complex, daily information with min assist verbal cues for use of external aids.   SLP Short Term Goal 5 (Week 2): Pt will recognize and correct errors in the moment during functional tasks with min assist verbal cues.   SLP Short Term Goal 6 (Week 2): Pt will selectively attend to tasks in a mildly distracting environment for 15 minute intervals with min verbal cues for redirection to task.    Weekly Progress Updates:  Pt has made slow progress towards overall areas of deficit but has not been at Mod I level for the goals mentioned in week 1. Pt continues to exhibit Min A to Mod A for semi-complex problem solving, selective attention, emergent awareness, recall of information and use of compensatory swallow strategies with PO consumption. As result, pt continues to require skilled ST to target the above mentioned deficits.      Intensity: Minumum of 1-2 x/day, 30 to 90 minutes Frequency: 3 to 5 out of 7 days Duration/Length of Stay: 12/22/18 Treatment/Interventions: Cognitive remediation/compensation;Cueing hierarchy;Dysphagia/aspiration precaution training;Environmental controls;Functional tasks;Internal/external aids;Patient/family education;Speech/Language facilitation   Daily Session  Skilled Therapeutic Interventions:    Skilled teatment session focused on dysphagia and cognition goals. SLP facilitated session by providing skilled observation of pt consuming thin liquids with Min A for EFFECTIVE use of chin  tuck. Pt remembered to tuck chin but swallowed as he was in process of tucking his chin - he presents with difficulty sequencing movement. As a result he intermittently displays overt s/s of aspiration (immediate throat clear and immediate cough - likely related to decreased pharyngeal strength and residue). Pt consumed graham crackers with improved oral clearing. Of note, pt also  struggling withclear nasal drainage. Would wait to upgrade to regular until pt is less distracted by drainage and has built stength with EMST/pharyngeal strengthening exercises.   To better facilitate improved swallow function and elminate need for chin tuck, RMST assessment was completed along with introduction to pharyngeal strengthening exercises. Pt obtained values for bth EMST and IMST that were below reference and LLN. Pt obtained the following scores: EMST 57cmH2O (reference 116 and LLN 59 cmH2O) and IMST 37cmH2O (reference 91 and LLN 52cmH2O). Pt set up with initial settings: IMST 20cmH2O and EMST 50cmH2O to achieve a perceived effort level of 7. It should be noted that pt's physical ability is better indicator than pt's stated effort level. Masako exercise introduced as well. Cognitively, pt required Mod A verbal cues to decrease impulsivity, increase selective attention and to effectively sequence motor movements to perform EMST/IMST. At end of session, pt able to demonstrate correct use. Overall, pt lacks ability to self-monitor actions during tasks. Pt also with poor working memory as he frequently lost track of counting the reps. Printed information left with pt as well as verbal education on rationale for exercises and cognitive abilities targeted.        General    Pain    Therapy/Group: Individual Therapy  Taeko Schaffer 12/08/2018, 12:52 PM

## 2018-12-09 ENCOUNTER — Inpatient Hospital Stay (HOSPITAL_COMMUNITY): Payer: Medicare HMO | Admitting: Physical Therapy

## 2018-12-09 ENCOUNTER — Inpatient Hospital Stay (HOSPITAL_COMMUNITY): Payer: Medicare HMO | Admitting: Speech Pathology

## 2018-12-09 ENCOUNTER — Inpatient Hospital Stay (HOSPITAL_COMMUNITY): Payer: Medicare HMO | Admitting: Occupational Therapy

## 2018-12-09 ENCOUNTER — Encounter: Payer: Self-pay | Admitting: Adult Health

## 2018-12-09 LAB — BASIC METABOLIC PANEL
Anion gap: 7 (ref 5–15)
BUN: 11 mg/dL (ref 8–23)
CO2: 24 mmol/L (ref 22–32)
Calcium: 8.7 mg/dL — ABNORMAL LOW (ref 8.9–10.3)
Chloride: 105 mmol/L (ref 98–111)
Creatinine, Ser: 0.97 mg/dL (ref 0.61–1.24)
GFR calc Af Amer: >60 mL/min (ref >60)
GFR calc non Af Amer: >60 mL/min (ref >60)
GLUCOSE: 96 mg/dL (ref 70–99)
Potassium: 3.8 mmol/L (ref 3.5–5.1)
Sodium: 136 mmol/L (ref 135–145)

## 2018-12-09 LAB — CBC
HCT: 40 % (ref 39.0–52.0)
Hemoglobin: 13.1 g/dL (ref 13.0–17.0)
MCH: 30.5 pg (ref 26.0–34.0)
MCHC: 32.8 g/dL (ref 30.0–36.0)
MCV: 93.2 fL (ref 80.0–100.0)
Platelets: 190 10*3/uL (ref 150–400)
RBC: 4.29 MIL/uL (ref 4.22–5.81)
RDW: 13.1 % (ref 11.5–15.5)
WBC: 3.5 10*3/uL — ABNORMAL LOW (ref 4.0–10.5)
nRBC: 0 % (ref 0.0–0.2)

## 2018-12-09 NOTE — Progress Notes (Signed)
Occupational Therapy Session Note  Patient Details  Name: Barry Woods MRN: [id number] Date of Birth: July 18, 1946  Today's Date: 12/09/2018 OT Individual Time: 6712-4580 OT Individual Time Calculation (min): 58 min   Short Term Goals: Week 1:  OT Short Term Goal 1 (Week 1): Pt will don shirt with supervision and min vcs for Lt side attendance  OT Short Term Goal 2 (Week 1): Pt will complete 1/3 steps of donning pants  OT Short Term Goal 3 (Week 1): Pt will complete 1 grooming task in standing to improve balance   Skilled Therapeutic Interventions/Progress Updates:    Pt greeted in w/c. Amenable to tx and requesting to use the restroom. Tx focus on standing balance, Lt NMR, trunk control, and functional transfers during bathing/dressing (sit<stand from TTB), toileting (sit<stand from low toilet) and grooming tasks (w/c level at sink). All functional transfers completed with Mod A stand pivot and manual facilitation for L UE/LE placement due to Lt inattention and decreased awareness. Pt able to complete perihygiene post B+B void while sitting dynamically with steady assist for balance. Max A for clothing mgt prior to voiding. During bathing, he required mod vcs for sequencing. Often "froze" during motor planning and then needed instruction for next step. Mod A sit<stand at grab bar with OT facilitating Lt hand grasp for NMR. Able to wash Rt underarm using Lt! HOH for integrating Lt while lathering hair with shampoo due to shoulder weakness. Pt able to assist with elevating LB garments on Rt side, however presented with increased tremor activity and required assist from OT as well to pull pants up fully. Total A for footwear with OT positioning L LE into modified figure 4 for increasing flexibility. Mod-max A for dressing overall with cues for carryover of hemi techniques, motor planning, and postural awareness (due Lt lean during dynamic tasks). At end of session he transferred back to w/c and  brushed his hair at the sink. Pt left with RN to eat lunch. He was left with safety belt fastened, in care of RN.    Therapy Documentation Precautions:  Precautions Precautions: None Precaution Comments: left inattention Restrictions Weight Bearing Restrictions: No Pain: No c/o pain during tx  Pain Assessment Pain Scale: 0-10 Pain Score: 0-No pain ADL: ADL Eating: Moderate cueing, Contact guard Where Assessed-Eating: Edge of bed Upper Body Dressing: Minimal assistance Where Assessed-Upper Body Dressing: Edge of bed Lower Body Dressing: Maximal assistance Where Assessed-Lower Body Dressing: Edge of bed Toilet Transfer: Moderate assistance Toilet Transfer Method: Stand pivot      Therapy/Group: Individual Therapy  Darcel Frane A Rewa Weissberg 12/09/2018, 12:12 PM

## 2018-12-09 NOTE — Progress Notes (Signed)
[place] [facility] PHYSICAL MEDICINE & REHABILITATION PROGRESS NOTE   Subjective/Complaints: No new complaints other than CNA's and lab techs coming at different times early in the morning to wake him up.  Objective:   No results found. Recent Labs    12/09/18 0544  WBC 3.5*  HGB 13.1  HCT 40.0  PLT 190   Recent Labs    12/07/18 0525 12/09/18 0544  NA 131* 136  K 4.0 3.8  CL 101 105  CO2 23 24  GLUCOSE 98 96  BUN 17 11  CREATININE 1.12 0.97  CALCIUM 8.6* 8.7*    Intake/Output Summary (Last 24 hours) at 12/09/2018 0931 Last data filed at 12/09/2018 1610 Gross per 24 hour  Intake 240 ml  Output 200 ml  Net 40 ml     Physical Exam: Vital Signs Blood pressure (!) 150/76, pulse (!) 54, temperature 98.6 F (37 C), resp. rate 16, height 5\' 7"  (1.702 m), weight 75.6 kg, SpO2 98 %.  Constitutional: No distress . Vital signs reviewed. HEENT: EOMI, oral membranes moist Neck: supple Cardiovascular: RRR without murmur. No JVD    Respiratory: CTA Bilaterally without wheezes or rales. Normal effort    GI: BS +, non-tender, non-distended  Neurologic: very alert. Better attention to left. Improving insight and awareness  Motor:  LUE: 3-/5 delt, bi , tri. 2/5 grip, stable  LLE:, 4-/5 HF, KE, 3/5 ADF/PF--stable Senses pain in all 4's Skin: Warm and dry.  Intact.  Assessment/Plan: 1. 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  Physiatrist is providing close team supervision and 24 hour management of active medical problems listed below.  Physiatrist and rehab team continue to assess barriers to discharge/monitor patient progress toward functional and medical goals  Care Tool:  Bathing  Bathing activity did not occur: (Not assessed) Body parts bathed by patient: Left arm, Chest, Abdomen, Front perineal area, Right upper leg, Left upper leg, Face   Body  parts bathed by helper: Right arm, Right lower leg, Left lower leg, Buttocks     Bathing assist Assist Level: Moderate Assistance - Patient 50 - 74%     Upper Body Dressing/Undressing Upper body dressing   What is the patient wearing?: Pull over shirt    Upper body assist Assist Level: Minimal Assistance - Patient > 75%    Lower Body Dressing/Undressing Lower body dressing      What is the patient wearing?: Pants, Incontinence brief     Lower body assist Assist for lower body dressing: Moderate Assistance - Patient 50 - 74%     Toileting Toileting Toileting Activity did not occur (Clothing management and hygiene only): N/A (no void or bm)  Toileting assist Assist for toileting: Moderate Assistance - Patient 50 - 74%     Transfers Chair/bed transfer  Transfers assist     Chair/bed transfer assist level: Contact Guard/Touching assist     Locomotion Ambulation   Ambulation assist      Assist level: Minimal Assistance - Patient > 75% Assistive device: Walker-rolling Max distance: 30'   Walk 10 feet activity   Assist     Assist level: Minimal Assistance - Patient > 75% Assistive device: Walker-rolling   Walk 50 feet activity   Assist Walk 50 feet with 2 turns activity did not occur: Safety/medical concerns         Walk 150 feet activity   Assist Walk 150 feet activity did  not occur: Safety/medical concerns         Walk 10 feet on uneven surface  activity   Assist Walk 10 feet on uneven surfaces activity did not occur: Safety/medical concerns         Wheelchair     Assist Will patient use wheelchair at discharge?: No Type of Wheelchair: Manual    Wheelchair assist level: Supervision/Verbal cueing Max wheelchair distance: 100'    Wheelchair 50 feet with 2 turns activity    Assist        Assist Level: Supervision/Verbal cueing   Wheelchair 150 feet activity     Assist Wheelchair 150 feet activity did not  occur: Safety/medical concerns   Assist Level: Minimal Assistance - Patient > 75%     Medical Problem List and Plan: 1.Left side weakness with 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 DVT Prophylaxis/Anticoagulation: SCDs. Monitor for any signs of DVT 3. Pain Management:Tylenol as needed 4. Mood:Provide emotional support 5. Neuropsych: This patientiscapable of making decisions on hisown behalf. 6. Skin/Wound Care:Routine skin checks 7. Fluids/Electrolytes/Nutrition:encourage PO  -I personally reviewed the patient's labs today.  .   8. Atrial fibrillation. ELIQUIS discontinued due to [place]. Cardiac rate controlled. Continue Tenormin 75 mg daily.   -HR controlled. BP recorded after activity this morning  -cardiology was consulted per request of family  -upon review of outpt chart, pt on 20-40mg  lasix at home at times for volume mgt  -lasix held due to hyponatremia   -weights holding steady Filed Weights   12/06/18 0501 12/07/18 0545 12/08/18 0748  Weight: 74.8 kg 75.6 kg 75.6 kg   -continue potassium 20 mEq daily  -no SOB at present 9. Hyperlipidemia. Zocor 10. Rheumatoid arthritis. Patient on chronic prednisone as well as scheduled injections of Humira prior to admission. Will Discuss plan of care. 11.  Hypertension  Continue atenolol   Fair control on 1/17, continue to monitor 12.  Hyponatremia--resolved off lasix  -136 1/17 13. ?mild gross hematuria  -ua negative, ucx negative   -none reported over last 24 hr  -continue to observe for now   LOS: 8 days A FACE TO FACE EVALUATION WAS PERFORMED  Meredith Staggers 12/09/2018, 9:31 AM

## 2018-12-09 NOTE — Progress Notes (Signed)
Physical Therapy Weekly Progress Note  Patient Details  Name: Barry Woods MRN: [id number] Date of Birth: 06-12-1946  Beginning of progress report period: December 02, 2018 End of progress report period: December 09, 2018  Today's Date: 12/09/2018 PT Individual Time: 0900-1015; 2094-7096 PT Individual Time Calculation (min): 75 min and 30 min  Patient has met 2 of 4 short term goals.  Pt is able to complete transfers consistently with CGA to min A with use of RW. Pt is able to ambulate up to 60 ft with RW with L hand orthosis and L PLS AFO and shoe cover for improved LLE clearance. Pt still requires mod-max cueing for LLE management during gait and transfers due to decreased sensation and awareness of limb. Pt is able to negotiate 4 stairs with 2 handrails with mod A, progressing towards min A. Pt exhibits good motivation and participation in therapy sessions, is limited by fatigue and is easily distracted and must be redirected to focus on therapy tasks.  Patient continues to demonstrate the following deficits muscle weakness, abnormal tone, unbalanced muscle activation, decreased coordination and decreased motor planning, decreased attention to left, decreased attention, decreased awareness and decreased safety awareness and decreased sitting balance, decreased standing balance, decreased postural control, hemiplegia and decreased balance strategies and therefore will continue to benefit from skilled PT intervention to increase functional independence with mobility.  Patient progressing toward long term goals..  Continue plan of care.  PT Short Term Goals Week 1:  PT Short Term Goal 1 (Week 1): Patient will demonstrate dynamic standing balance with minguard A and single UE support. PT Short Term Goal 1 - Progress (Week 1): Met PT Short Term Goal 2 (Week 1): Patient will demonstrate bed to chair transfers consistently with min A.  PT Short Term Goal 2 - Progress (Week 1): Met PT Short Term  Goal 3 (Week 1): Patient will ambulate 100' with min A with LRAD. PT Short Term Goal 3 - Progress (Week 1): Progressing toward goal PT Short Term Goal 4 (Week 1): Patient to demonstrate R side awareness by placing L foot appropriately for transfers at least 75% of the time with min gesturing cues. L PT Short Term Goal 4 - Progress (Week 1): Progressing toward goal PT Short Term Goal 5 (Week 1): Patient to negotiate 4 stairs with min A with railings. PT Short Term Goal 5 - Progress (Week 1): Progressing toward goal Week 2:  PT Short Term Goal 1 (Week 2): Pt will ambulate x 100' with min A and LRAD PT Short Term Goal 2 (Week 2): Pt will negotiate 4 stairs with 2 handrails and min A PT Short Term Goal 3 (Week 2): Pt will demonstrate awareness of LLE with gait and transfers with cueing required less than 25% of the time  Skilled Therapeutic Interventions/Progress Updates:  Session 1: Pt received seated in recliner in room, agreeable to PT session. No complaints of pain. Stand pivot transfer recliner to w/c with RW and CGA, v/c for LLE management. Setup A to brush teeth while seated at sink. Guided pt through respiratory exercises per SLP request, pt completes 3 x 15 reps of expiration and inspiration exercises. Ambulation x 60 ft with RW and min A for balance with L hand orthosis, L PLS AFO, and shoe cover. Pt requires v/c to attend to L visual field but exhibits improved LLE clearance with gait with decreased cues needed for advancement. Ascend/descend 4 6" stairs with 2 handrails and mod A, step-to gait pattern.  Pt requires v/c for safety and for LLE placement while negotiating stairs. Standing LLE 1" step taps with focus on hip flexor activation, 2 x 10 reps. Standing obstacle step-overs x 10 reps B with focus on heel strike and anterior weight shift after clearing obstacle. Pt left seated in w/c in room with needs in reach, quick release belt and chair alarm in place.  Session 2: Pt received seated  w/c in room, agreeable to PT. No complaints of pain. Pt requests to use the bathroom. Stand pivot transfer w/c to/from toilet with min to mod A and use of grab bar. Pt is dependent for clothing management and independent for pericare. Nustep level 5 x 5 min with BLE only, improved control of LLE with decreased hip abd and ER noted. Stand pivot transfer w/c to recliner with RW and min A. Pt easily distracted throughout session and needs v/c to attend to therapy tasks. Pt left seated in recliner in room with needs in reach, chair alarm and quick release belt in place.  Therapy Documentation Precautions:  Precautions Precautions: None Precaution Comments: left inattention Restrictions Weight Bearing Restrictions: No Pain: Pain Assessment Pain Scale: 0-10 Pain Score: 0-No pain   Therapy/Group: Individual Therapy   Excell Seltzer, PT, DPT  12/09/2018, 12:24 PM

## 2018-12-09 NOTE — Progress Notes (Signed)
Speech Language Pathology Daily Session Note  Patient Details  Name: Barry Woods MRN: [id number] Date of Birth: [birth year]  Today's Date: 12/09/2018 SLP Individual Time: 0815-0900 SLP Individual Time Calculation (min): 45 min  Short Term Goals: Week 2: SLP Short Term Goal 1 (Week 2): Pt will consume therapeutic trials of regular textures with supervision cues for use of swallowing precautions.   SLP Short Term Goal 2 (Week 2): Pt will consume trials of regular with supervision cues for use of swallow strategies.  SLP Short Term Goal 3 (Week 2): Pt will complete mildly complex daily tasks with min assist verbal cues for functional problem solving.   SLP Short Term Goal 4 (Week 2): Pt will recall mildly complex, daily information with min assist verbal cues for use of external aids.   SLP Short Term Goal 5 (Week 2): Pt will recognize and correct errors in the moment during functional tasks with min assist verbal cues.   SLP Short Term Goal 6 (Week 2): Pt will selectively attend to tasks in a mildly distracting environment for 15 minute intervals with min verbal cues for redirection to task.    Skilled Therapeutic Interventions:  Skilled treatment session focused on dysphagia and cognition goals. SLP received pt consuming breakfast (dysphagia 3 with thin liquids) from NT. Pt was perseverative on being woken up at 5 am to take vitals as well as conversation with MD. Afte extensive redirection to task, pt consumed intial boluses without chin tuck. When cued, pt with ineffectively chin tuck (he simply looked downward). Instruction given ton chin tuck and pt able to implement. Education also provided to nursing on correct use and ROM of chin tuck. Pt continues to have difficulty sequencing chin tuck with swallow, he frequently swallows before or during movement of chin tuck. This resuts in intermittent wet vocal quality or intermittent cough. Despite education, pt with no ability to self-monitor  vocal wetness. Throughout session, pt required Max A cues to redirect to task and to decrease perseveration on various topics. With Min A cues, pt able to sequence use of IMST/EMST devices and complete 2 sets of 15 with IMST set at 35 cmH2O and EMST set at 60 cmH2O with perceived effort of 5 to 7 out of 10. Pt had written on his "homework" sheet that he had completed exercises x 1 on previous day. Education provided that exercises would help to strengthen pharyngeal phase of swallow and hopefully decrease need for chin tuck. Discussed repeating MBS in another week for further assess. SLP spoke with PT and OT to have t practice EMST/IMST and masako during their sessions to also address recall and improved sequencing of tasks. During discussion with OT about activities, pt required Max A to problem solve sheet that this writer left. He required assistance understanding that he needed to record the time at which he practiced during the day. There are 3 time slots and he needs to practice 3 times per day. Pt also was not able to count each rep as he completed it because of deficits in his attention to task.         Pain  No pain reported  Therapy/Group: Individual Therapy  Nashon Erbes 12/09/2018, 2:16 PM

## 2018-12-10 ENCOUNTER — Inpatient Hospital Stay (HOSPITAL_COMMUNITY): Payer: Medicare HMO | Admitting: Occupational Therapy

## 2018-12-10 MED ORDER — POTASSIUM CHLORIDE CRYS ER 20 MEQ PO TBCR
20.0000 meq | EXTENDED_RELEASE_TABLET | Freq: Two times a day (BID) | ORAL | Status: DC
  Administered 2018-12-10 – 2018-12-22 (×24): 20 meq via ORAL
  Filled 2018-12-10 (×24): qty 1

## 2018-12-10 NOTE — Plan of Care (Signed)
  Problem: Consults Goal: RH STROKE PATIENT EDUCATION Description See Patient Education module for education specifics  Outcome: Progressing   Problem: RH SKIN INTEGRITY Goal: RH STG SKIN FREE OF INFECTION/BREAKDOWN Description Skin to remain free from additional breakdown and infection while on rehab with min assist.  Outcome: Progressing Goal: RH STG MAINTAIN SKIN INTEGRITY WITH ASSISTANCE Description STG Maintain Skin Integrity With min Assistance.  Outcome: Progressing Goal: RH STG ABLE TO PERFORM INCISION/WOUND CARE W/ASSISTANCE Description STG Able To Perform Wound Care With min Assistance.  Outcome: Progressing   Problem: RH SAFETY Goal: RH STG ADHERE TO SAFETY PRECAUTIONS W/ASSISTANCE/DEVICE Description STG Adhere to Safety Precautions With min Assistance and appropriate assistive Device.  Outcome: Progressing   Problem: RH PAIN MANAGEMENT Goal: RH STG PAIN MANAGED AT OR BELOW PT'S PAIN GOAL Description <3 on a 0-10 pain scale.  Outcome: Progressing   Problem: RH KNOWLEDGE DEFICIT Goal: RH STG INCREASE KNOWLEDGE OF HYPERTENSION Description Patient and family will demonstrate knowledge on HTN medications and dietary restrictions with min assist from rehab staff before discharge.  Outcome: Progressing Goal: RH STG INCREASE KNOWLEGDE OF HYPERLIPIDEMIA Description Patient and family will demonstrate knowledge of HLD medications and dietary restrictions with min assistance from rehab staff before discharge.  Outcome: Progressing Goal: RH STG INCREASE KNOWLEDGE OF STROKE PROPHYLAXIS Description Patient and family will demonstrate knowledge of ways to prevent future strokes with medications, dietary restrictions, and exercise management with min assist from rehab staff before discharge.  Outcome: Progressing

## 2018-12-10 NOTE — Progress Notes (Signed)
Occupational Therapy Session Note  Patient Details  Name: Barry Woods MRN: [id number] Date of Birth: Jul 29, 1946  Today's Date: 12/10/2018 OT Individual Time: 9509-3267 OT Individual Time Calculation (min): 45 min    Short Term Goals: Week 1:  OT Short Term Goal 1 (Week 1): Pt will don shirt with supervision and min vcs for Lt side attendance  OT Short Term Goal 2 (Week 1): Pt will complete 1/3 steps of donning pants  OT Short Term Goal 3 (Week 1): Pt will complete 1 grooming task in standing to improve balance   Skilled Therapeutic Interventions/Progress Updates:    Patient seated in w/c and eager for therapy session.  He denies pain and requests a shower this morning.   Steady transfer to/from w/c, toilet, shower bench - CG A to stand in steady ADL:  UB bathing / dressing min A, LB bathing / dressing with mod/max A, grooming set up, toileting min A for hygiene and pants up Sit to stand at bedside with CG - noted improved balance and postural control. Patient seated in w/c at close of session with seat belt alarm set and call bell in reach.  Therapy Documentation Precautions:  Precautions Precautions: None Precaution Comments: left inattention Restrictions Weight Bearing Restrictions: No General:   Vital Signs:   Pain: Pain Assessment Pain Scale: 0-10 Pain Score: 0-No pain      Therapy/Group: Individual Therapy  Carlos Levering 12/10/2018, 12:27 PM

## 2018-12-10 NOTE — Progress Notes (Signed)
Occupational Therapy Session Note  Patient Details  Name: Barry Woods MRN: [id number] Date of Birth: July 14, 1946  Today's Date: 12/10/2018 OT Individual Time: 2482-5003 OT Individual Time Calculation (min): 45 min    Short Term Goals: Week 1:  OT Short Term Goal 1 (Week 1): Pt will don shirt with supervision and min vcs for Lt side attendance  OT Short Term Goal 2 (Week 1): Pt will complete 1/3 steps of donning pants  OT Short Term Goal 3 (Week 1): Pt will complete 1 grooming task in standing to improve balance   Skilled Therapeutic Interventions/Progress Updates:   1:1 Focus on NMR focus on trunk control, left UE functioning, standing balance and righting reactions. Pt performed stand pivot w/c to mat with extra time with min A with mod multimodal cues for sequencing.  Performed sit to stands without UE support and controlled descents. In standing performed functional reaching and transferring small objects on a table top with focus on visual scanning to the left, grasp and release, and sustained mm activation with movement. Pt able to maintaining standing with contact guard with sustained activation in left LE. When return to sitting focused on grasp and release of cup and transporting cups as well as focus on supination/ pronation.  In standing performed dynamic balance task stepping on different flat targets on the floor outside of BOS with min A and min UE support. PT able to demonstrate dorsiflexion and ability to pick left foot off the floor.   However with ambulation pt requires max to total cues for clearance of left LE and for step size due to decr sensation and pro perception- requiring mod A for ambulating ~166 feet.  Transferred into the bed with mod A to be able to get into flat bed and align self in the middle of the bed.  Performed PNF in supine with focus on smooth and controlled movements of left UE with cues to visually attend UE. Pt again with difficulty with sustained  activity without visually attending to UE and with supination and pronation.   Min A transfer back into w/c at end of session.    Therapy Documentation Precautions:  Precautions Precautions: None Precaution Comments: left inattention Restrictions Weight Bearing Restrictions: No Pain: Pain Assessment Pain Scale: 0-10 Pain Score: 0-No pain   Therapy/Group: Individual Therapy  [name] [facility] 12/10/2018, 1:21 PM

## 2018-12-10 NOTE — Progress Notes (Signed)
[facility] PHYSICAL MEDICINE & REHABILITATION PROGRESS NOTE   Subjective/Complaints:   Patient is in good spirits.  Very upbeat regarding his progress in therapy and ability to walk.  ROS: Patient denies fever, rash, sore throat, blurred vision, nausea, vomiting, diarrhea, cough, shortness of breath or chest pain, joint or back pain, headache, or mood change.   .  Objective:   No results found. Recent Labs    12/09/18 0544  WBC 3.5*  HGB 13.1  HCT 40.0  PLT 190   Recent Labs    12/09/18 0544  NA 136  K 3.8  CL 105  CO2 24  GLUCOSE 96  BUN 11  CREATININE 0.97  CALCIUM 8.7*    Intake/Output Summary (Last 24 hours) at 12/10/2018 0939 Last data filed at 12/09/2018 2057 Gross per 24 hour  Intake 480 ml  Output -  Net 480 ml     Physical Exam: Vital Signs Blood pressure 126/85, pulse 60, temperature 98.1 F (36.7 C), temperature source Oral, resp. rate 15, height 5\' 7"  (1.702 m), weight 76.7 kg, SpO2 94 %.  Constitutional: No distress . Vital signs reviewed. HEENT: EOMI, oral membranes moist Neck: supple Cardiovascular: IRR IRR without murmur. No JVD    Respiratory: CTA Bilaterally without wheezes or rales. Normal effort    GI: BS +, non-tender, non-distended  Neurologic: very alert. Better attention to left. Improving insight and awareness  Motor:  LUE: 3-/5 delt, bi , tri. 2/5 grip, improving engagement LLE:, 4-/5 HF, KE, 3/5 ADF/PF--stable Senses pain in all 4's Skin: Warm and dry.  Intact.  Assessment/Plan: 1. 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  Physiatrist is providing close team supervision and 24 hour management of active medical problems listed below.  Physiatrist and rehab team continue to assess barriers to discharge/monitor patient progress toward functional and medical goals  Care Tool:  Bathing  Bathing  activity did not occur: (Not assessed) Body parts bathed by patient: Left arm, Chest, Abdomen, Front perineal area, Right upper leg, Left upper leg, Face, Right lower leg, Left lower leg   Body parts bathed by helper: Right arm, Buttocks     Bathing assist Assist Level: Minimal Assistance - Patient > 75%(Able to reach feet with steady assist for balance)     Upper Body Dressing/Undressing Upper body dressing   What is the patient wearing?: Pull over shirt    Upper body assist Assist Level: Moderate Assistance - Patient 50 - 74%    Lower Body Dressing/Undressing Lower body dressing      What is the patient wearing?: Pants, Incontinence brief     Lower body assist Assist for lower body dressing: Maximal Assistance - Patient 25 - 49%     Toileting Toileting Toileting Activity did not occur (Clothing management and hygiene only): N/A (no void or bm)  Toileting assist Assist for toileting: Moderate Assistance - Patient 50 - 74%     Transfers Chair/bed transfer  Transfers assist     Chair/bed transfer assist level: Contact Guard/Touching assist     Locomotion Ambulation   Ambulation assist      Assist level: Minimal Assistance - Patient > 75% Assistive device: Walker-rolling Max distance: 60'   Walk 10 feet activity   Assist     Assist level: Minimal Assistance - Patient > 75% Assistive device: Walker-rolling   Walk 50 feet activity   Assist Walk 50 feet with  2 turns activity did not occur: Safety/medical concerns  Assist level: Minimal Assistance - Patient > 75% Assistive device: Walker-rolling    Walk 150 feet activity   Assist Walk 150 feet activity did not occur: Safety/medical concerns         Walk 10 feet on uneven surface  activity   Assist Walk 10 feet on uneven surfaces activity did not occur: Safety/medical concerns         Wheelchair     Assist Will patient use wheelchair at discharge?: No Type of Wheelchair: Manual     Wheelchair assist level: Supervision/Verbal cueing Max wheelchair distance: 100'    Wheelchair 50 feet with 2 turns activity    Assist        Assist Level: Supervision/Verbal cueing   Wheelchair 150 feet activity     Assist Wheelchair 150 feet activity did not occur: Safety/medical concerns   Assist Level: Minimal Assistance - Patient > 75%     Medical Problem List and Plan: 1.Left side weakness 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 DVT Prophylaxis/Anticoagulation: SCDs. Monitor for any signs of DVT 3. Pain Management:Tylenol as needed 4. Mood:Provide emotional support 5. Neuropsych: This patientiscapable of making decisions on hisown behalf. 6. Skin/Wound Care:Routine skin checks 7. Fluids/Electrolytes/Nutrition:encourage PO  -I personally reviewed the patient's labs today.  .   8. Atrial fibrillation. ELIQUIS discontinued due to [place]. Cardiac rate controlled. Continue Tenormin 75 mg daily.   -HR controlled. BP recorded after activity this morning  -cardiology was consulted per request of family  -upon review of outpt chart, pt on 20-40mg  lasix at home at times for volume mgt  -lasix held due to hyponatremia   -weights holding steady Filed Weights   12/07/18 0545 12/08/18 0748 12/10/18 0255  Weight: 75.6 kg 75.6 kg 76.7 kg   -Potassium level remains a bit low, will increase to potassium supplement back to 20 mEq twice daily  -no SOB at present 9. Hyperlipidemia. Zocor 10. Rheumatoid arthritis. Patient on chronic prednisone as well as scheduled injections of Humira prior to admission. Will Discuss plan of care. 11.  Hypertension  Continue atenolol   Fair control on 1/17, continue to  monitor 12.  Hyponatremia--resolved off lasix  -136 1/17--- follow-up next week 13. ?mild gross hematuria  -ua negative, ucx negative   -none reported over last 48 hr  -continue to observe for now   LOS: 9 days A FACE TO FACE EVALUATION WAS PERFORMED  Meredith Staggers 12/10/2018, 9:39 AM

## 2018-12-11 ENCOUNTER — Inpatient Hospital Stay (HOSPITAL_COMMUNITY): Payer: Medicare HMO

## 2018-12-11 NOTE — Progress Notes (Signed)
[place] PHYSICAL MEDICINE & REHABILITATION PROGRESS NOTE   Subjective/Complaints:   Remains in good spirits. Pleased with his progress in therapies  ROS: Patient denies fever, rash, sore throat, blurred vision, nausea, vomiting, diarrhea, cough, shortness of breath or chest pain, joint or back pain, headache, or mood change.  .  Objective:   No results found. Recent Labs    12/09/18 0544  WBC 3.5*  HGB 13.1  HCT 40.0  PLT 190   Recent Labs    12/09/18 0544  NA 136  K 3.8  CL 105  CO2 24  GLUCOSE 96  BUN 11  CREATININE 0.97  CALCIUM 8.7*    Intake/Output Summary (Last 24 hours) at 12/11/2018 0929 Last data filed at 12/11/2018 0900 Gross per 24 hour  Intake 1020 ml  Output 550 ml  Net 470 ml     Physical Exam: Vital Signs Blood pressure 119/72, pulse (!) 59, temperature 97.8 F (36.6 C), temperature source Oral, resp. rate 16, height 5\' 7"  (1.702 m), weight 77 kg, SpO2 94 %.  Constitutional: No distress . Vital signs reviewed. HEENT: EOMI, oral membranes moist Neck: supple Cardiovascular: RRR without murmur. No JVD    Respiratory: CTA Bilaterally without wheezes or rales. Normal effort    GI: BS +, non-tender, non-distended  Neurologic: very alert. Better attention to left. Improving insight and awareness  Motor:  LUE: 3-/5 delt, bi , tri. 2/5 grip, improving engagement LLE:, 4-/5 HF, KE, 3/5 ADF/PF--stable Senses pain in all 4's Skin: Warm and dry.  Intact.  Assessment/Plan: 1. 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  Physiatrist is providing close team supervision and 24 hour management of active medical problems listed below.  Physiatrist and rehab team continue to assess barriers to discharge/monitor patient progress toward functional and medical goals  Care Tool:  Bathing  Bathing activity did not occur: (Not  assessed) Body parts bathed by patient: Left arm, Chest, Abdomen, Front perineal area, Right upper leg, Left upper leg, Face, Right lower leg, Left lower leg, Right arm   Body parts bathed by helper: Buttocks     Bathing assist Assist Level: Minimal Assistance - Patient > 75%     Upper Body Dressing/Undressing Upper body dressing   What is the patient wearing?: Pull over shirt    Upper body assist Assist Level: Moderate Assistance - Patient 50 - 74%    Lower Body Dressing/Undressing Lower body dressing      What is the patient wearing?: Pants, Incontinence brief     Lower body assist Assist for lower body dressing: Maximal Assistance - Patient 25 - 49%     Toileting Toileting Toileting Activity did not occur (Clothing management and hygiene only): N/A (no void or bm)  Toileting assist Assist for toileting: Moderate Assistance - Patient 50 - 74%     Transfers Chair/bed transfer  Transfers assist     Chair/bed transfer assist level: Contact Guard/Touching assist     Locomotion Ambulation   Ambulation assist      Assist level: Minimal Assistance - Patient > 75% Assistive device: Walker-rolling Max distance: 60'   Walk 10 feet activity   Assist     Assist level: Minimal Assistance - Patient > 75% Assistive device: Walker-rolling   Walk 50 feet activity   Assist Walk 50 feet with 2 turns activity did not occur: Safety/medical concerns  Assist level: Minimal Assistance - Patient >  75% Assistive device: Walker-rolling    Walk 150 feet activity   Assist Walk 150 feet activity did not occur: Safety/medical concerns         Walk 10 feet on uneven surface  activity   Assist Walk 10 feet on uneven surfaces activity did not occur: Safety/medical concerns         Wheelchair     Assist Will patient use wheelchair at discharge?: No Type of Wheelchair: Manual    Wheelchair assist level: Supervision/Verbal cueing Max wheelchair  distance: 100'    Wheelchair 50 feet with 2 turns activity    Assist        Assist Level: Supervision/Verbal cueing   Wheelchair 150 feet activity     Assist Wheelchair 150 feet activity did not occur: Safety/medical concerns   Assist Level: Minimal Assistance - Patient > 75%     Medical Problem List and Plan: 1.Left side weakness with dysarthriasecondary to 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 DVT Prophylaxis/Anticoagulation: SCDs. Monitor for any signs of DVT 3. Pain Management:Tylenol as needed 4. Mood:Provide emotional support 5. Neuropsych: This patientiscapable of making decisions on hisown behalf. 6. Skin/Wound Care:Routine skin checks 7. Fluids/Electrolytes/Nutrition:encourage PO  -improved intake.  Marland Kitchen   8. Atrial fibrillation. ELIQUIS discontinued due to [place]. Cardiac rate controlled. Continue Tenormin 75 mg daily.   -HR controlled. BP recorded after activity this morning  -cardiology was consulted per request of family  -upon review of outpt chart, pt on 20-40mg  lasix at home at times for volume mgt  -lasix held due to hyponatremia   -weights holding steady around 76kg Filed Weights   12/08/18 0748 12/10/18 0255 12/11/18 0425  Weight: 75.6 kg 76.7 kg 77 kg   -continue potassium supplement back to 20 mEq twice daily  -no SOB at present 9. Hyperlipidemia. Zocor 10. Rheumatoid arthritis. Patient on chronic prednisone as well as scheduled injections of Humira prior to admission. Will Discuss plan of care. 11.  Hypertension  Continue atenolol   Fair control on 1/17, continue to monitor 12.  Hyponatremia--resolved off lasix  -136 1/17--- follow-up next week 13. ?mild gross hematuria  -appears  to have resolved   LOS: 10 days A FACE TO FACE EVALUATION WAS PERFORMED  Meredith Staggers 12/11/2018, 9:29 AM

## 2018-12-11 NOTE — Progress Notes (Signed)
Occupational Therapy Session Note  Patient Details  Name: Barry Woods MRN: [id number] Date of Birth: 02/19/1946  Today's Date: 12/11/2018 OT Individual Time: 0700-0755 OT Individual Time Calculation (min): 55 min    Short Term Goals: Week 1:  OT Short Term Goal 1 (Week 1): Pt will don shirt with supervision and min vcs for Lt side attendance  OT Short Term Goal 2 (Week 1): Pt will complete 1/3 steps of donning pants  OT Short Term Goal 3 (Week 1): Pt will complete 1 grooming task in standing to improve balance   Skilled Therapeutic Interventions/Progress Updates:    1:1. Pt with no c/o pain declining bathing and dressing this session and reporting already groomed.  Objects and OT placed on L for L attention throughout session. Pt completes table top activites working on LUE NMR and FMC with focus on manipulation, transportation of blocks, checkers and board game pieces for functional reach laterally and crossing midline with min A d/t weak shoulder flexion when reaching. Pt requires demonstration and VC for digit extension of digit 4-5 when releasing objects. Exited session wihtp tseated in w/c, belt alarm in place, call light in reach and all needs met.  Therapy Documentation Precautions:  Precautions Precautions: None Precaution Comments: left inattention Restrictions Weight Bearing Restrictions: No General:   Vital Signs: Therapy Vitals Temp: 97.8 F (36.6 C) Temp Source: Oral Pulse Rate: (!) 57 Resp: 16 BP: 136/90 Patient Position (if appropriate): Lying Oxygen Therapy SpO2: 94 % O2 Device: Room Air Pain:   ADL: ADL Eating: Moderate cueing, Contact guard Where Assessed-Eating: Edge of bed Upper Body Dressing: Minimal assistance Where Assessed-Upper Body Dressing: Edge of bed Lower Body Dressing: Maximal assistance Where Assessed-Lower Body Dressing: Edge of bed Toilet Transfer: Moderate assistance Toilet Transfer Method: Stand pivot Vision   Perception    Praxis   Exercises:   Other Treatments:     Therapy/Group: Individual Therapy   M  12/11/2018, 7:58 AM 

## 2018-12-12 ENCOUNTER — Inpatient Hospital Stay (HOSPITAL_COMMUNITY): Payer: Medicare HMO | Admitting: Occupational Therapy

## 2018-12-12 ENCOUNTER — Inpatient Hospital Stay (HOSPITAL_COMMUNITY): Payer: Medicare HMO | Admitting: Physical Therapy

## 2018-12-12 ENCOUNTER — Encounter (HOSPITAL_COMMUNITY): Payer: Medicare HMO | Admitting: Psychology

## 2018-12-12 ENCOUNTER — Inpatient Hospital Stay (HOSPITAL_COMMUNITY): Payer: Medicare HMO

## 2018-12-12 DIAGNOSIS — I613 Nontraumatic intracerebral hemorrhage in brain stem: Secondary | ICD-10-CM

## 2018-12-12 MED ORDER — FUROSEMIDE 20 MG PO TABS
20.0000 mg | ORAL_TABLET | Freq: Once | ORAL | Status: AC
  Administered 2018-12-12: 20 mg via ORAL
  Filled 2018-12-12: qty 1

## 2018-12-12 NOTE — Progress Notes (Signed)
Physical Therapy Session Note  Patient Details  Name: Barry Woods MRN: [id number] Date of Birth: [birth year]  Today's Date: 12/12/2018 PT Individual Time: 1100-1200 PT Individual Time Calculation (min): 60 min   Short Term Goals: Week 2:  PT Short Term Goal 1 (Week 2): Pt will ambulate x 100' with min A and LRAD PT Short Term Goal 2 (Week 2): Pt will negotiate 4 stairs with 2 handrails and min A PT Short Term Goal 3 (Week 2): Pt will demonstrate awareness of LLE with gait and transfers with cueing required less than 25% of the time  Skilled Therapeutic Interventions/Progress Updates:    Pt received seated in w/c in room, agreeable to PT. No complaints of pain. Sit to stand with CGA to RW. Ambulation x 60 ft with RW with L hand orthosis and min A, max v/c for LLE awareness and progression with gait due to decreased proprioception and sensation. Pt reports feeling more fatigued this AM and reports some dizziness. Seated BP 146/84 and pt remains asymptomatic for remainder of therapy session. Standing alt L/R LE dot taps for focus on BLE coordination and standing balance with RW and min A. Ascend/descend 4 stairs with 2 handrails and min A, step-to gait pattern. Standing mini-squats 3 x 10 reps with RW for balance. Pt left seated in w/c in room with needs in reach, quick release belt in place.  Therapy Documentation Precautions:  Precautions Precautions: None Precaution Comments: left inattention Restrictions Weight Bearing Restrictions: No    Therapy/Group: Individual Therapy   Excell Seltzer, PT, DPT  12/12/2018, 12:19 PM

## 2018-12-12 NOTE — Progress Notes (Signed)
Occupational Therapy Weekly Progress Note  Patient Details  Name: Barry Woods MRN: [id number] Date of Birth: [birth year]  Beginning of progress report period: December 02, 2018 End of progress report period: December 12, 2018  Today's Date: 12/12/2018 OT Individual Time: 0800-0900 OT Individual Time Calculation (min): 60 min    Patient has met 2 of 3 short term goals. Pt making progress towards occupational therapy goals this week. Pt has increased improvement with L UE for functional tasks but continues to need min - mod cuing to utilize. Pt has been addressing [facility] quite a bit as well with focus on digit extension after grasping an object. Pt continues to need cuing for hemiplegic dressing technique each day. Mod A stand pivot transfer onto shower equipment and toileting. Pt is very motivated for OT intervention each session.   Patient continues to demonstrate the following deficits: muscle weakness, decreased cardiorespiratoy endurance, decreased coordination and decreased motor planning, L inattention, decreased midline orientation, decreased attention, decreased awareness, decreased problem solving and decreased safety awareness and decreased sitting balance, decreased standing balance, decreased postural control, hemiplegia and decreased balance strategies and therefore will continue to benefit from skilled OT intervention to enhance overall performance with BADL and iADL.  Patient progressing toward long term goals..  Continue plan of care.  OT Short Term Goals Week 1:  OT Short Term Goal 1 (Week 1): Pt will don shirt with supervision and min vcs for Lt side attendance  OT Short Term Goal 1 - Progress (Week 1): Not met OT Short Term Goal 2 (Week 1): Pt will complete 1/3 steps of donning pants  OT Short Term Goal 2 - Progress (Week 1): Met OT Short Term Goal 3 (Week 1): Pt will complete 1 grooming task in standing to improve balance  OT Short Term Goal 3 - Progress (Week 1): Met Week  2:  OT Short Term Goal 1 (Week 2): STGs=LTGs secondary to upcoming discharge  Skilled Therapeutic Interventions/Progress Updates:    Upon entering the room, pt seated in wheelchair finishing breakfast. Pt agreeable to OT intervention and requesting to shower. OT assisted pt via wheelchair into bathroom with mod lifting assistance for stand pivot transfer to the L with use of grab bar. Pt remained seated on TTB for bathing tasks and use of lateral leans to assist him with washing buttocks. Pt able to utilize L UE in functional way with min cuing for functional tasks. No drops from L UE while washing self. Pt returning to wheelchair and donning clothing items with focus on sit <>stand for LB dressing with min A at sink for standing balance. Pt required mod A for UB dressing with mod cuing for hemiplegic dressing technique. Pt performed grooming task with increased time and min cuing for sequencing and problem solving use of B UEs. Pt remained in wheelchair at end of session with chair alarm donned and call bell within reach.   Therapy Documentation Precautions:  Precautions Precautions: None Precaution Comments: left inattention Restrictions Weight Bearing Restrictions: No Pain: Pain Assessment Pain Score: 0-No pain ADL: ADL Eating: Moderate cueing, Contact guard Where Assessed-Eating: Edge of bed Upper Body Dressing: Minimal assistance Where Assessed-Upper Body Dressing: Edge of bed Lower Body Dressing: Maximal assistance Where Assessed-Lower Body Dressing: Edge of bed Toilet Transfer: Moderate assistance Toilet Transfer Method: Stand pivot   Therapy/Group: Individual Therapy  Gypsy Decant 12/12/2018, 2:33 PM

## 2018-12-12 NOTE — Progress Notes (Signed)
[place] PHYSICAL MEDICINE & REHABILITATION PROGRESS NOTE   Subjective/Complaints:   Again in good spirits. Talked about football games yesterday. Slept well  ROS: Patient denies fever, rash, sore throat, blurred vision, nausea, vomiting, diarrhea, cough, shortness of breath or chest pain, joint or back pain, headache, or mood change.   .  Objective:   No results found. No results for input(s): WBC, HGB, HCT, PLT in the last 72 hours. No results for input(s): NA, K, CL, CO2, GLUCOSE, BUN, CREATININE, CALCIUM in the last 72 hours.  Intake/Output Summary (Last 24 hours) at 12/12/2018 0917 Last data filed at 12/12/2018 0700 Gross per 24 hour  Intake 538 ml  Output 300 ml  Net 238 ml     Physical Exam: Vital Signs Blood pressure 133/81, pulse 63, temperature 98 F (36.7 C), temperature source Oral, resp. rate 15, height 5\' 7"  (1.702 m), weight 77.2 kg, SpO2 96 %.  Constitutional: No distress . Vital signs reviewed. HEENT: EOMI, oral membranes moist Neck: supple Cardiovascular: RRR without murmur. No JVD    Respiratory: CTA Bilaterally without wheezes or rales. Normal effort    GI: BS +, non-tender, non-distended  Neurologic: very alert. Better attention to left. Improving insight and awareness  Motor:  LUE: 3/5 delt, bi , tri. 2 to 2+/5 grip, improving engagement of left side LLE:, 4-/5 HF, KE, 3/5 ADF/PF--stable Senses pain in all 4's Skin: Warm and dry.  Intact. Psych: pleasant and cooperative  Assessment/Plan: 1. 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  Physiatrist is providing close team supervision and 24 hour management of active medical problems listed below.  Physiatrist and rehab team continue to assess barriers to discharge/monitor patient progress toward functional and medical goals  Care Tool:  Bathing  Bathing activity did not  occur: (Not assessed) Body parts bathed by patient: Left arm, Chest, Abdomen, Front perineal area, Right upper leg, Left upper leg, Face, Right lower leg, Left lower leg, Right arm   Body parts bathed by helper: Buttocks     Bathing assist Assist Level: Minimal Assistance - Patient > 75%     Upper Body Dressing/Undressing Upper body dressing   What is the patient wearing?: Pull over shirt    Upper body assist Assist Level: Moderate Assistance - Patient 50 - 74%    Lower Body Dressing/Undressing Lower body dressing      What is the patient wearing?: Pants, Incontinence brief     Lower body assist Assist for lower body dressing: Maximal Assistance - Patient 25 - 49%     Toileting Toileting Toileting Activity did not occur (Clothing management and hygiene only): N/A (no void or bm)  Toileting assist Assist for toileting: Moderate Assistance - Patient 50 - 74%     Transfers Chair/bed transfer  Transfers assist     Chair/bed transfer assist level: Contact Guard/Touching assist     Locomotion Ambulation   Ambulation assist      Assist level: Minimal Assistance - Patient > 75% Assistive device: Walker-rolling Max distance: 60'   Walk 10 feet activity   Assist     Assist level: Minimal Assistance - Patient > 75% Assistive device: Walker-rolling   Walk 50 feet activity   Assist Walk 50 feet with 2 turns activity did not occur: Safety/medical concerns  Assist level: Minimal Assistance - Patient > 75% Assistive device: Walker-rolling    Walk 150 feet activity  Assist Walk 150 feet activity did not occur: Safety/medical concerns         Walk 10 feet on uneven surface  activity   Assist Walk 10 feet on uneven surfaces activity did not occur: Safety/medical concerns         Wheelchair     Assist Will patient use wheelchair at discharge?: No Type of Wheelchair: Manual    Wheelchair assist level: Supervision/Verbal cueing Max  wheelchair distance: 100'    Wheelchair 50 feet with 2 turns activity    Assist        Assist Level: Supervision/Verbal cueing   Wheelchair 150 feet activity     Assist Wheelchair 150 feet activity did not occur: Safety/medical concerns   Assist Level: Minimal Assistance - Patient > 75%     Medical Problem List and Plan: 1.Left side weakness with 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 DVT Prophylaxis/Anticoagulation: SCDs. Monitor for any signs of DVT 3. Pain Management:Tylenol as needed 4. Mood:Provide emotional support 5. Neuropsych: This patientiscapable of making decisions on hisown behalf. 6. Skin/Wound Care:Routine skin checks 7. Fluids/Electrolytes/Nutrition:encourage PO  -improved intake.  Marland Kitchen   8. Atrial fibrillation. ELIQUIS discontinued due to [place]. Cardiac rate controlled. Continue Tenormin 75 mg daily.   -HR controlled. BP recorded after activity this morning  -cardiology was consulted per request of family  -upon review of outpt chart, pt on 20-40mg  lasix at home at times for volume mgt  -lasix held due to hyponatremia   -weights with slight trend up over last week.  Will give 20mg  lasix today Filed Weights   12/10/18 0255 12/11/18 0425 12/12/18 0348  Weight: 76.7 kg 77 kg 77.2 kg   -continue potassium supplement back to 20 mEq twice daily  -no SOB at present 9. Hyperlipidemia. Zocor 10. Rheumatoid arthritis. Patient on chronic prednisone as well as scheduled injections of Humira prior to admission. Will Discuss plan of care. 11.  Hypertension  Continue atenolol   Fair control on 1/17, continue to  monitor 12.  Hyponatremia--resolved off lasix  -136 1/17--- follow-up later this week 13. ?mild gross hematuria  -appears to have resolved   LOS: 11 days A FACE TO Frank 12/12/2018, 9:17 AM

## 2018-12-12 NOTE — Progress Notes (Signed)
Occupational Therapy Session Note  Patient Details  Name: Barry Woods MRN: [id number] Date of Birth: 1946-07-29  Today's Date: 12/12/2018 OT Individual Time: 9563-8756 OT Individual Time Calculation (min): 31 min   Short Term Goals: Week 1:  OT Short Term Goal 1 (Week 1): Pt will don shirt with supervision and min vcs for Lt side attendance  OT Short Term Goal 1 - Progress (Week 1): Not met OT Short Term Goal 2 (Week 1): Pt will complete 1/3 steps of donning pants  OT Short Term Goal 2 - Progress (Week 1): Met OT Short Term Goal 3 (Week 1): Pt will complete 1 grooming task in standing to improve balance  OT Short Term Goal 3 - Progress (Week 1): Met  Skilled Therapeutic Interventions/Progress Updates:    Pt greeted in w/c with family present. No c/o pain and ADL needs met. Worked on functional grasp/release and gross motor coordination of Lt when folding wash cloths and towels using B UEs. He required Mod A for towels and max vcs to recognize with he lost Lt hand grip. Stand pivot<toilet completed with Mod A and manual facilitation for L UE placement. Pt able to lower pants using both UEs. Post void of bladder, Max A for elevating pants over hips. After pt returned to w/c he washed his hands at the sink with Min A for L UE placement. He was left with all needs within reach and safety belt fastened.   Therapy Documentation Precautions:  Precautions Precautions: None Precaution Comments: left inattention Restrictions Weight Bearing Restrictions: No Vital Signs: Therapy Vitals Pulse Rate: 71 Resp: 20 BP: 122/72 Patient Position (if appropriate): Sitting Oxygen Therapy SpO2: 98 % O2 Device: Room Air Pain: No c/o pain during tx  Pain Assessment Pain Score: 0-No pain ADL: ADL Eating: Moderate cueing, Contact guard Where Assessed-Eating: Edge of bed Upper Body Dressing: Minimal assistance Where Assessed-Upper Body Dressing: Edge of bed Lower Body Dressing: Maximal  assistance Where Assessed-Lower Body Dressing: Edge of bed Toilet Transfer: Moderate assistance Toilet Transfer Method: Stand pivot      Therapy/Group: Individual Therapy  Elora Wolter A Ebrima Ranta 12/12/2018, 4:03 PM

## 2018-12-12 NOTE — Consult Note (Signed)
  Neuropsychological consult    Barry Woods is a 73 year old male with a history of hyperlipidemia, hypertension, rheumatoid arthritis, and has been maintained on chronic prednisone and receives scheduled injections of Humira, chronic atrial fibrillation recently placed on Eliquis.  The patient presented on 11/27/2018 with left-sided weakness and slurred speech as well as a fall.  Cranial CT scan reviewed showed right brain hemorrhage.  Acute hemorrhage within the right lentiform nucleus measuring up to 3.9 cm.  Follow-up MRI was without change in right basal ganglia hematoma.  There was noted a 4 mm acute infarction in the medial left subcortical white matter.  Today, the patient was well oriented with good cognition, expressive language, memory, and executive functioning.  The patient did have left-sided weakness but reported that he has been experiencing significant improvement in both his left arm and left leg motor functioning.  The patient described good mood now that he is seeing some improvements in his overall functioning but he is still very stressed about how this residual effects will impact his ability to maintain independence overall.  Today we worked on coping and adjustment issues around residual effects of his basal ganglia and right sided white matter infarction.

## 2018-12-12 NOTE — Progress Notes (Signed)
Speech Language Pathology Daily Session Note  Patient Details  Name: Barry Woods MRN: [id number] Date of Birth: 1946-09-27  Today's Date: 12/12/2018 SLP Individual Time: 1255-1353 SLP Individual Time Calculation (min): 58 min  Short Term Goals: Week 2: SLP Short Term Goal 1 (Week 2): Pt will consume therapeutic trials of regular textures with supervision cues for use of swallowing precautions.   SLP Short Term Goal 2 (Week 2): Pt will consume trials of regular with supervision cues for use of swallow strategies.  SLP Short Term Goal 3 (Week 2): Pt will complete mildly complex daily tasks with min assist verbal cues for functional problem solving.   SLP Short Term Goal 4 (Week 2): Pt will recall mildly complex, daily information with min assist verbal cues for use of external aids.   SLP Short Term Goal 5 (Week 2): Pt will recognize and correct errors in the moment during functional tasks with min assist verbal cues.   SLP Short Term Goal 6 (Week 2): Pt will selectively attend to tasks in a mildly distracting environment for 15 minute intervals with min verbal cues for redirection to task.    Skilled Therapeutic Interventions:Skilled ST services focused on swallow skills. Pt requested to use bathroom to urninate twice during session, SLP assisted with mod A. Pt required mod-min A verbal cues for use of safety precautions, locking brakes. SLP facilitated PO consumption of thin liquid via straw and dys 3 lunch tray, pt required min A verbal cues to utilize chin tuck appropriately and mod A verbal cues for intermittent throat clearing. SLP educated pt about follow up [place] scheduled for Wednesday, pt appeared excited. SLP facilitated rerun demonstration of RMT devices, pt completed two sets of EMST set at 60cm H2O with a self precived effort of 5/10 and 1 set of 10 of IMST set at 35cm H2O with self precived effort level of 3/ 10 and 1 set of 10 masako exercises. SLP re-created homework, unable to  locate and filled in times of completed exercises. Pt stated he will complete the remaining sets today. Pt was left in room with call bell within reach and chair alarm set. Recommend to continue skilled ST services.     Pain Pain Assessment Pain Score: 0-No pain  Therapy/Group: Individual Therapy  Cedar Ditullio  [facility] 12/12/2018, 1:57 PM

## 2018-12-13 ENCOUNTER — Inpatient Hospital Stay (HOSPITAL_COMMUNITY): Payer: Medicare HMO | Admitting: Occupational Therapy

## 2018-12-13 ENCOUNTER — Inpatient Hospital Stay (HOSPITAL_COMMUNITY): Payer: Medicare HMO

## 2018-12-13 ENCOUNTER — Inpatient Hospital Stay (HOSPITAL_COMMUNITY): Payer: Medicare HMO | Admitting: Physical Therapy

## 2018-12-13 NOTE — Progress Notes (Signed)
Physical Therapy Session Note  Patient Details  Name: Barry Woods MRN: [id number] Date of Birth: 03/31/1946  Today's Date: 12/13/2018 PT Individual Time: 7943-2761 PT Individual Time Calculation (min): 75 min   Short Term Goals: Week 2:  PT Short Term Goal 1 (Week 2): Pt will ambulate x 100' with min A and LRAD PT Short Term Goal 2 (Week 2): Pt will negotiate 4 stairs with 2 handrails and min A PT Short Term Goal 3 (Week 2): Pt will demonstrate awareness of LLE with gait and transfers with cueing required less than 25% of the time  Skilled Therapeutic Interventions/Progress Updates:    Pt received seated in w/c from OT session, agreeable to PT. No complaints of pain. Sit to stand with min A to LiteGait. Pt has had incontinence of urine in his w/c. Dependent transport via w/c back to room. Stand pivot transfer w/c to toilet with mod A and use of grab bar. Pt has continent bowel movement once seated on toilet, dependent for pericare and clothing management and brief change. Stand pivot transfer mod A back to w/c. Sit to stand with min A to LiteGait while sling is donned. Min A x 2 to step up onto LiteGait. Ambulation forwards at 0.2 mph with BUE support, mod A for weight shift at hips, max A to advance LLE. Sidesteps to the R and L at 0.2 mph with mod A for weight shift at hips and min A to advance LLE. Forwards ambulation with no UE support and max A for weight shift at hips, min A to advance LLE with use of mirror for upright posture and visual feedback. Pt exhibits pusher tendencies to the L with poor ability to weight shift to the R to unweight LLE in standing. Ambulation x 25 ft with RW and mod A for weight shift at hips for carryover from [employer] training. Pt exhibits fair ability to weight shift without multimodal cueing. Pt left seated in w/c in room with needs in reach, quick release belt and chair alarm in place, family present.  Therapy Documentation Precautions:   Precautions Precautions: None Precaution Comments: left inattention Restrictions Weight Bearing Restrictions: No Pain: Pain Assessment Pain Scale: 0-10 Pain Score: 0-No pain    Therapy/Group: Individual Therapy   Excell Seltzer, PT, DPT  12/13/2018, 4:34 PM

## 2018-12-13 NOTE — Progress Notes (Signed)
Patient continues to refuse bowel medications. Patient has been educated on importance of these medications and effectiveness of these medications.

## 2018-12-13 NOTE — Progress Notes (Signed)
Occupational Therapy Session Note  Patient Details  Name: Barry Woods MRN: [id number] Date of Birth: 09-19-1946  Today's Date: 12/13/2018 OT Individual Time: 4158-3094 OT Individual Time Calculation (min): 14 min  and Today's Date: 12/13/2018 OT Missed Time: 36 Minutes Missed Time Reason: Patient fatigue   Short Term Goals: Week 2:  OT Short Term Goal 1 (Week 2): STGs=LTGs secondary to upcoming discharge  Skilled Therapeutic Interventions/Progress Updates:    Upon entering the room, pt supine in bed and sleeping soundly. Pt verbalized not sleeping well last night and having no energy today. Pt requesting to rest and declined OT intervention this session. OT reviewed schedule with pt and encouraged him to participate but he declines. 45 missed minutes secondary to fatigue. OT will attempt again if time allows and pt is available.   Therapy Documentation Precautions:  Precautions Precautions: None Precaution Comments: left inattention Restrictions Weight Bearing Restrictions: No General: General OT Amount of Missed Time: 45 Minutes  Pain: Pain Assessment Pain Scale: 0-10 Pain Score: 0-No pain ADL: ADL Eating: Moderate cueing, Contact guard Where Assessed-Eating: Edge of bed Upper Body Dressing: Minimal assistance Where Assessed-Upper Body Dressing: Edge of bed Lower Body Dressing: Maximal assistance Where Assessed-Lower Body Dressing: Edge of bed Toilet Transfer: Moderate assistance Toilet Transfer Method: Stand pivot   Therapy/Group: Individual Therapy  Gypsy Decant 12/13/2018, 11:26 AM

## 2018-12-13 NOTE — Progress Notes (Signed)
Speech Language Pathology Daily Session Note  Patient Details  Name: Barry Woods MRN: [id number] Date of Birth: 1946/09/08  Today's Date: 12/13/2018 SLP Individual Time: 0803-0900 SLP Individual Time Calculation (min): 57 min  Short Term Goals: Week 2: SLP Short Term Goal 1 (Week 2): Pt will consume therapeutic trials of regular textures with supervision cues for use of swallowing precautions.   SLP Short Term Goal 2 (Week 2): Pt will consume trials of regular with supervision cues for use of swallow strategies.  SLP Short Term Goal 3 (Week 2): Pt will complete mildly complex daily tasks with min assist verbal cues for functional problem solving.   SLP Short Term Goal 4 (Week 2): Pt will recall mildly complex, daily information with min assist verbal cues for use of external aids.   SLP Short Term Goal 5 (Week 2): Pt will recognize and correct errors in the moment during functional tasks with min assist verbal cues.   SLP Short Term Goal 6 (Week 2): Pt will selectively attend to tasks in a mildly distracting environment for 15 minute intervals with min verbal cues for redirection to task.    Skilled Therapeutic Interventions:Skilled ST services focused on swallow and cognitive skills. SLP facilitated PO consumption of dys 3 and thin via straw, breakfast tray, pt required min A verbal cues to utilizie and demonstrate appropriate ROM of chin tuck. Pt demonstrated recall of scheduled MBS for tomorrow. Pt requested to brush teeh, pt required supervision physical assistance and Mod I for functional problem solving. SLP facilitated semi-complex problem solving and error awareness utilizing organizational task, plotting a vegetable garden given verbal descriptions to indicate location, pt required mod A verbal cues. Pt continues to demonstrate impulsivity impacting problem solving and error awareness. SLP facilitated returned demonstration  pharyngeal exercises,  pt completed 1 set of 15 of masako,  IMST and EMST with self precived effort level of 6 out 10. Pt was left in room with call bell within reach and chair alarm set. SLP reccomends to continue skilled services.     Pain Pain Assessment Pain Score: 0-No pain  Therapy/Group: Individual Therapy  Ryleigh Buenger  [facility] 12/13/2018, 2:21 PM

## 2018-12-13 NOTE — Progress Notes (Signed)
Occupational Therapy Session Note  Patient Details  Name: Barry Woods MRN: [id number] Date of Birth: 1946-09-29  Today's Date: 12/13/2018 OT Individual Time: 1400-1430 OT Individual Time Calculation (min): 30 min    Short Term Goals: Week 2:  OT Short Term Goal 1 (Week 2): STGs=LTGs secondary to upcoming discharge  Skilled Therapeutic Interventions/Progress Updates:    Session focused on L UE NMR. Pt c/o fatigue but no pain during session. Pt's L UE was placed in gravity eliminated position and focus on facilitated elbow flexion and extension. Verbal and tactile cueing provided for reduced compensation of L shoulder. Pt completed scapular AROM with tactile cues for correct activation. Pt completed sustained gross grasp and release with cup. Pt was passed off to PT in hallway.   Therapy Documentation Precautions:  Precautions Precautions: None Precaution Comments: left inattention Restrictions Weight Bearing Restrictions: No General: General OT Amount of Missed Time: 45 Minutes Vital Signs: Therapy Vitals Temp: 98.9 F (37.2 C) Pulse Rate: (!) 57 Resp: 18 BP: 99/65 Patient Position (if appropriate): Sitting Oxygen Therapy SpO2: 97 % O2 Device: Room Air Pain: Pain Assessment Pain Scale: 0-10 Pain Score: 0-No pain   Therapy/Group: Individual Therapy  Curtis Sites 12/13/2018, 2:40 PM

## 2018-12-13 NOTE — Progress Notes (Signed)
[facility] PHYSICAL MEDICINE & REHABILITATION PROGRESS NOTE   Subjective/Complaints:   Pt frustrated that he's having to wait for supervision to eat (which is delaying meals). Otherwise he's doing ok  ROS: Patient denies fever, rash, sore throat, blurred vision, nausea, vomiting, diarrhea, cough, shortness of breath or chest pain, joint or back pain, headache, or mood change.  .  Objective:   No results found. No results for input(s): WBC, HGB, HCT, PLT in the last 72 hours. No results for input(s): NA, K, CL, CO2, GLUCOSE, BUN, CREATININE, CALCIUM in the last 72 hours.  Intake/Output Summary (Last 24 hours) at 12/13/2018 0920 Last data filed at 12/13/2018 9030 Gross per 24 hour  Intake 120 ml  Output 225 ml  Net -105 ml     Physical Exam: Vital Signs Blood pressure 116/64, pulse (!) 57, temperature 99.3 F (37.4 C), temperature source Oral, resp. rate 16, height 5\' 7"  (1.702 m), weight 72.1 kg, SpO2 97 %.  Constitutional: No distress . Vital signs reviewed. HEENT: EOMI, oral membranes moist Neck: supple Cardiovascular: RRR without murmur. No JVD    Respiratory: CTA Bilaterally without wheezes or rales. Normal effort    GI: BS +, non-tender, non-distended  Neurologic: very alert. Better attention to left. Improving insight and awareness  Motor:  LUE: 2+/5 delt, 3+/5bi , tri. 3-/5 grip, improving engagement of left side LLE:, 4-/5 HF, KE, 3+ to 4/5 ADF/PF--stable Senses pain in all 4's Skin: Warm and dry.  Intact. Psych: generally pleasant  Assessment/Plan: 1. 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  Physiatrist is providing close team supervision and 24 hour management of active medical problems listed below.  Physiatrist and rehab team continue to assess barriers to discharge/monitor patient progress toward functional and medical goals  Care  Tool:  Bathing  Bathing activity did not occur: (Not assessed) Body parts bathed by patient: Left arm, Chest, Abdomen, Front perineal area, Right upper leg, Left upper leg, Face, Right lower leg, Left lower leg, Right arm   Body parts bathed by helper: Buttocks     Bathing assist Assist Level: Minimal Assistance - Patient > 75%     Upper Body Dressing/Undressing Upper body dressing   What is the patient wearing?: Pull over shirt    Upper body assist Assist Level: Moderate Assistance - Patient 50 - 74%    Lower Body Dressing/Undressing Lower body dressing      What is the patient wearing?: Pants, Incontinence brief     Lower body assist Assist for lower body dressing: Maximal Assistance - Patient 25 - 49%     Toileting Toileting Toileting Activity did not occur (Clothing management and hygiene only): N/A (no void or bm)  Toileting assist Assist for toileting: Moderate Assistance - Patient 50 - 74%     Transfers Chair/bed transfer  Transfers assist     Chair/bed transfer assist level: Contact Guard/Touching assist     Locomotion Ambulation   Ambulation assist      Assist level: Minimal Assistance - Patient > 75% Assistive device: Walker-rolling Max distance: 60'   Walk 10 feet activity   Assist     Assist level: Minimal Assistance - Patient > 75% Assistive device: Walker-rolling   Walk 50 feet activity   Assist Walk 50 feet with 2 turns activity did not occur: Safety/medical concerns  Assist level: Minimal Assistance - Patient > 75% Assistive device: Walker-rolling  Walk 150 feet activity   Assist Walk 150 feet activity did not occur: Safety/medical concerns         Walk 10 feet on uneven surface  activity   Assist Walk 10 feet on uneven surfaces activity did not occur: Safety/medical concerns         Wheelchair     Assist Will patient use wheelchair at discharge?: No Type of Wheelchair: Manual    Wheelchair assist  level: Supervision/Verbal cueing Max wheelchair distance: 100'    Wheelchair 50 feet with 2 turns activity    Assist        Assist Level: Supervision/Verbal cueing   Wheelchair 150 feet activity     Assist Wheelchair 150 feet activity did not occur: Safety/medical concerns   Assist Level: Minimal Assistance - Patient > 75%     Medical Problem List and Plan: 1.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 DVT Prophylaxis/Anticoagulation: SCDs. Monitor for any signs of DVT 3. Pain Management:Tylenol as needed 4. Mood:Provide emotional support 5. Neuropsych: This patientiscapable of making decisions on hisown behalf. 6. Skin/Wound Care:Routine skin checks 7. Fluids/Electrolytes/Nutrition:encourage PO  -improved intake.   -spoke to RN re: potentially lifting supervision with meals .   8. Atrial fibrillation. ELIQUIS discontinued due to [place]. Cardiac rate controlled. Continue Tenormin 75 mg daily.   -HR controlled. BP recorded after activity this morning  -cardiology was consulted per request of family  -upon review of outpt chart, pt on 20-40mg  lasix at home at times for volume mgt  -lasix held due to hyponatremia   -weights with slight trend up over last week.    -lasix 20mg  given x 1 yesterday. Weight down today 10kg!!!  Filed Weights   12/11/18 0425 12/12/18 0348 12/13/18 0600  Weight: 77 kg 77.2 kg 72.1 kg   -continue potassium supplement back to 20 mEq twice daily  -no SOB at present 9. Hyperlipidemia. Zocor 10. Rheumatoid arthritis. Patient on chronic prednisone as well as scheduled injections of Humira prior to admission. Will Discuss plan of care. 11.  Hypertension  Continue atenolol    Fair control on 1/17, continue to monitor 12.  Hyponatremia--resolved off lasix  -136 1/17--- recheck Thursday 13. ?mild gross hematuria  -appears to have resolved   LOS: 12 days A FACE TO [place] 12/13/2018, 9:20 AM

## 2018-12-14 ENCOUNTER — Inpatient Hospital Stay (HOSPITAL_COMMUNITY): Payer: Medicare HMO | Admitting: Occupational Therapy

## 2018-12-14 ENCOUNTER — Inpatient Hospital Stay (HOSPITAL_COMMUNITY): Payer: Medicare HMO | Admitting: Physical Therapy

## 2018-12-14 ENCOUNTER — Inpatient Hospital Stay (HOSPITAL_COMMUNITY): Payer: Medicare HMO

## 2018-12-14 ENCOUNTER — Encounter (HOSPITAL_COMMUNITY): Payer: Medicare HMO | Admitting: Speech Pathology

## 2018-12-14 ENCOUNTER — Inpatient Hospital Stay (HOSPITAL_COMMUNITY): Payer: Medicare HMO | Admitting: Speech Pathology

## 2018-12-14 NOTE — Progress Notes (Signed)
Modified Barium Swallow Progress Note  Patient Details  Name: Barry Woods MRN: [id number] Date of Birth: December 06, 1945  Today's Date: 12/14/2018  Modified Barium Swallow completed.  Full report located under Chart Review in the Imaging Section.  Brief recommendations include the following:  Clinical Impression  Pt continues to present with moderate sensorimotor oropharyngeal dysphagia. Pt continues to present with decreased base of tongues strength and decreased laryngeal movement. As such, he doesn't achieve epiglottic deflection. Pt's swallow is also delayed to the pyriform sinuses. As a result of the above, pt aspirates thin liquids during the swallow. Pt continues to present with mild pharyngeal residue in the vallecula and pyriform sinuses. Although residue appears mild on fluro, pt anteriorally aspirates residue after the swallow. All aspiration was silent. Chin tuck and supraglottic swallow were ineffective in preventing aspiration, in fact more residue accumulated in laryngeal vestibule with these strateies. Head turn to left was only strategy that prevent aspiration and reduced pharyngeal residue. Recommend upgrading to regular diet, medicine  whole in puree and head turn to left during consumption. Would recommend continuing pharyngeal strengthening exercises.    Swallow Evaluation Recommendations       SLP Diet Recommendations: Regular solids;Thin liquid   Liquid Administration via: Cup;Straw   Medication Administration: Whole meds with liquid   Supervision: Patient able to self feed   Compensations: Slow rate;Small sips/bites;Clear throat intermittently;Other (Comment)(left head turn)   Postural Changes: Seated upright at 90 degrees   Oral Care Recommendations: Oral care BID        [name] 12/14/2018,12:22 PM

## 2018-12-14 NOTE — Progress Notes (Signed)
Speech Language Pathology Weekly Progress and Session Note  Patient Details  Name: Barry Woods MRN: [id number] Date of Birth: 1946/04/20  Beginning of progress report period: December 08, 2018 End of progress report period: December 15, 2018  Today's Date: 12/14/2018   Skilled treatment session #1 SLP Individual Time: 0900-0920 SLP Individual Time Calculation (min): 20 min  Skilled treatment session #2 SLP Individual Time: 0920-0930 SLP Individual Time Calculation (min): 10 min   Skilled treatment session #3 SLP Individual Time:1430-1500 SLP Individual Time Calculation (min): 30 min  Short Term Goals: Week 2: SLP Short Term Goal 1 (Week 2): Pt will consume therapeutic trials of regular textures with supervision cues for use of swallowing precautions.   SLP Short Term Goal 1 - Progress (Week 2): Progressing toward goal SLP Short Term Goal 2 (Week 2): Pt will consume thin liquids with supervision cues for use of swallow strategies.  SLP Short Term Goal 2 - Progress (Week 2): Progressing toward goal SLP Short Term Goal 3 (Week 2): Pt will complete mildly complex daily tasks with min assist verbal cues for functional problem solving.   SLP Short Term Goal 3 - Progress (Week 2): Progressing toward goal SLP Short Term Goal 4 (Week 2): Pt will recall mildly complex, daily information with min assist verbal cues for use of external aids.   SLP Short Term Goal 4 - Progress (Week 2): Progressing toward goal SLP Short Term Goal 5 (Week 2): Pt will recognize and correct errors in the moment during functional tasks with min assist verbal cues.   SLP Short Term Goal 5 - Progress (Week 2): Progressing toward goal SLP Short Term Goal 6 (Week 2): Pt will selectively attend to tasks in a mildly distracting environment for 15 minute intervals with min verbal cues for redirection to task.   SLP Short Term Goal 6 - Progress (Week 2): Progressing toward goal    New Short Term Goals: Week 3: SLP  Short Term Goal 1 (Week 3): Pt will consume regular textures with supervision cues for use of swallowing precautions.   SLP Short Term Goal 2 (Week 3): Pt will consume thin liquids with supervision cues for use of swallow strategies.  SLP Short Term Goal 3 (Week 3): Pt will complete mildly complex daily tasks with min assist verbal cues for functional problem solving.   SLP Short Term Goal 4 (Week 3): Pt will recall mildly complex, daily information with min assist verbal cues for use of external aids.   SLP Short Term Goal 5 (Week 3): Pt will recognize and correct errors in the moment during functional tasks with min assist verbal cues.   SLP Short Term Goal 6 (Week 3): Pt will selectively attend to tasks in a mildly distracting environment for 15 minute intervals with min verbal cues for redirection to task.    Weekly Progress Updates:  Pt has made progress during this reporting period but his overall functional ability continues to be limited by decreased awareness of errors and ability to perform tasks. As a result he is making progress towards his goals but has not reached level of completion. Pt continues to exhibit deficits in memory, attention, emergent awareness and dysphagia. Skilled ST is required to target these deficits and increase pt's functional independence. Pt is likely to require Min A for cognitive tasks at discharge with follow up [place] or Outpatient ST.   Intensity: Minumum of 1-2 x/day, 30 to 90 minutes Frequency: 3 to 5 out of 7 days Duration/Length  of Stay: 12/22/18 Treatment/Interventions: Cognitive remediation/compensation;Cueing hierarchy;Dysphagia/aspiration precaution training;Environmental controls;Functional tasks;Internal/external aids;Patient/family education;Speech/Language facilitation   Daily Session  Skilled Therapeutic Interventions:   Skilled treatment session #1: completion of MBS. See results in note section.   Skilled treatment session #2 focused on  education with pt's on results of MBS. Pt's swallow function has worsened in both timeliness of swallow initiation and in pharyngeal strength. As a result, he should turn his head to the left when swallowing liquids and food. Pt is able to demonstrate.   Skilled treatment session #3 focused on continuing education with pt regarding swallow function and appropriate use of head turn to left. Pt demonstrates understanding of swallow deficits and reports that head turn to left "feels better when swallowing." Will continue targeting pharyngeal strengthening exercises.     General    Pain Pain Assessment Pain Scale: 0-10 Pain Score: 0-No pain  Therapy/Group: Individual Therapy  Barry Woods 12/14/2018, 3:13 PM

## 2018-12-14 NOTE — Progress Notes (Signed)
[name] PHYSICAL MEDICINE & REHABILITATION PROGRESS NOTE   Subjective/Complaints:   No new complaints. Anxious to complete his MBS today and advance diet.   ROS: Patient denies fever, rash, sore throat, blurred vision, nausea, vomiting, diarrhea, cough, shortness of breath or chest pain, joint or back pain, headache, or mood change.   .  Objective:   Dg Swallowing Func-speech Pathology  Result Date: 12/14/2018 Objective Swallowing Evaluation: Type of Study: MBS-Modified Barium Swallow Study  Patient Details Name: Barry Woods MRN: [id number] Date of Birth: 05-26-1946 Today's Date: 12/14/2018 Time: SLP Start Time (ACUTE ONLY): 9518 -SLP Stop Time (ACUTE ONLY): 1100 SLP Time Calculation (min) (ACUTE ONLY): 20 min Past Medical History: Past Medical History: Diagnosis Date . Arthritis  . Cerebral cavernoma  . Hyperlipidemia  . Hypertension  . Rheumatoid arthritis [facility])  Past Surgical History: Past Surgical History: Procedure Laterality Date . COLONOSCOPY   . LIPOMA EXCISION   . PALATE / UVULA BIOPSY / EXCISION   . TONSILLECTOMY    age 73 HPI: The patient is a 73 year old white male who was admitted yesterday with a right basal ganglia hemorrhage.  No data recorded Assessment / Plan / Recommendation CHL IP CLINICAL IMPRESSIONS 12/14/2018 Clinical Impression Pt continues to present with moderate sensorimotor oropharyngeal dysphagia. Pt continues to present with decreased base of tongues strength and decreased laryngeal movement. As such, he doesn't achieve epiglottic deflection. Pt's swallow is also delayed to the pyriform sinuses. As a result of the above, pt aspirates thin liquids during the swallow. Pt continues to present with mild pharyngeal residue in the vallecula and pyriform sinuses. Although residue appears mild on fluro, pt anteriorally aspirates residue after the swallow. All aspiration was silent. Chin tuck and supraglottic swallow were ineffective in preventing aspiration, in fact more  residue accumulated in laryngeal vestibule with these strateies. Head turn to left was only strategy that prevent aspiration and reduced pharyngeal residue. Recommend upgrading to regular diet, medicine  whole in puree and head turn to left during consumption. Would recommend continuing pharyngeal strengthening exercises.  SLP Visit Diagnosis Attention and concentration deficit Attention and concentration deficit following Cerebral infarction Frontal lobe and executive function deficit following -- Impact on safety and function Mild aspiration risk   CHL IP TREATMENT RECOMMENDATION 12/14/2018 Treatment Recommendations Therapy as outlined in treatment plan below   Prognosis 11/28/2018 Prognosis for Safe Diet Advancement Good Barriers to Reach Goals -- Barriers/Prognosis Comment -- CHL IP DIET RECOMMENDATION 12/14/2018 SLP Diet Recommendations Regular solids;Thin liquid Liquid Administration via Cup;Straw Medication Administration Whole meds with liquid Compensations Slow rate;Small sips/bites;Clear throat intermittently;Other (Comment) Postural Changes Seated upright at 90 degrees   CHL IP OTHER RECOMMENDATIONS 12/14/2018 Recommended Consults -- Oral Care Recommendations Oral care BID Other Recommendations --   CHL IP FOLLOW UP RECOMMENDATIONS 11/29/2018 Follow up Recommendations Inpatient Rehab   CHL IP FREQUENCY AND DURATION 11/29/2018 Speech Therapy Frequency (ACUTE ONLY) min 1 x/week Treatment Duration --      CHL IP ORAL PHASE 12/14/2018 Oral Phase Impaired Oral - Pudding Teaspoon -- Oral - Pudding Cup -- Oral - Honey Teaspoon -- Oral - Honey Cup -- Oral - Nectar Teaspoon -- Oral - Nectar Cup -- Oral - Nectar Straw -- Oral - Thin Teaspoon Weak lingual manipulation;Incomplete tongue to palate contact;Piecemeal swallowing;Premature spillage;Decreased bolus cohesion Oral - Thin Cup Weak lingual manipulation;Incomplete tongue to palate contact;Piecemeal swallowing;Decreased bolus cohesion;Premature spillage Oral - Thin Straw  Incomplete tongue to palate contact;Weak lingual manipulation;Lingual/palatal residue;Piecemeal swallowing;Decreased bolus cohesion;Premature spillage Oral -  Puree -- Oral - Mech Soft -- Oral - Regular -- Oral - Multi-Consistency -- Oral - Pill -- Oral Phase - Comment --  CHL IP PHARYNGEAL PHASE 12/14/2018 Pharyngeal Phase Impaired Pharyngeal- Pudding Teaspoon -- Pharyngeal -- Pharyngeal- Pudding Cup -- Pharyngeal -- Pharyngeal- Honey Teaspoon -- Pharyngeal -- Pharyngeal- Honey Cup -- Pharyngeal -- Pharyngeal- Nectar Teaspoon NT Pharyngeal -- Pharyngeal- Nectar Cup -- Pharyngeal -- Pharyngeal- Nectar Straw NT Pharyngeal -- Pharyngeal- Thin Teaspoon Reduced anterior laryngeal mobility;Reduced epiglottic inversion;Reduced laryngeal elevation;Delayed swallow initiation-pyriform sinuses;Reduced airway/laryngeal closure;Penetration/Aspiration before swallow;Moderate aspiration Pharyngeal Material enters airway, passes BELOW cords without attempt by patient to eject out (silent aspiration) Pharyngeal- Thin Cup Delayed swallow initiation-pyriform sinuses;Reduced epiglottic inversion;Reduced anterior laryngeal mobility;Reduced laryngeal elevation;Reduced airway/laryngeal closure;Reduced tongue base retraction;Penetration/Aspiration during swallow;Penetration/Apiration after swallow;Moderate aspiration;Pharyngeal residue - pyriform;Pharyngeal residue - valleculae;Pharyngeal residue - posterior pharnyx;Lateral channel residue;Compensatory strategies attempted (with notebox);Other (Comment) Pharyngeal Material enters airway, passes BELOW cords without attempt by patient to eject out (silent aspiration) Pharyngeal- Thin Straw Delayed swallow initiation-pyriform sinuses;Reduced epiglottic inversion;Reduced anterior laryngeal mobility;Reduced laryngeal elevation;Reduced airway/laryngeal closure;Reduced tongue base retraction;Penetration/Aspiration during swallow;Penetration/Apiration after swallow;Moderate aspiration;Pharyngeal  residue - valleculae;Pharyngeal residue - pyriform;Pharyngeal residue - posterior pharnyx;Compensatory strategies attempted (with notebox);Other (Comment) Pharyngeal Material enters airway, passes BELOW cords without attempt by patient to eject out (silent aspiration) Pharyngeal- Puree NT Pharyngeal -- Pharyngeal- Mechanical Soft -- Pharyngeal -- Pharyngeal- Regular NT Pharyngeal -- Pharyngeal- Multi-consistency -- Pharyngeal -- Pharyngeal- Pill -- Pharyngeal -- Pharyngeal Comment --  CHL IP CERVICAL ESOPHAGEAL PHASE 12/14/2018 Cervical Esophageal Phase WFL Pudding Teaspoon -- Pudding Cup -- Honey Teaspoon -- Honey Cup -- Nectar Teaspoon -- Nectar Cup -- Nectar Straw -- Thin Teaspoon -- Thin Cup -- Thin Straw -- Puree -- Mechanical Soft -- Regular -- Multi-consistency -- Pill -- Cervical Esophageal Comment -- Happi Overton 12/14/2018, 12:25 PM              No results for input(s): WBC, HGB, HCT, PLT in the last 72 hours. No results for input(s): NA, K, CL, CO2, GLUCOSE, BUN, CREATININE, CALCIUM in the last 72 hours.  Intake/Output Summary (Last 24 hours) at 12/14/2018 1246 Last data filed at 12/14/2018 0745 Gross per 24 hour  Intake 462 ml  Output 575 ml  Net -113 ml     Physical Exam: Vital Signs Blood pressure 128/73, pulse 72, temperature 99 F (37.2 C), temperature source Oral, resp. rate 16, height 5\' 7"  (1.702 m), weight 70.8 kg, SpO2 97 %.  Constitutional: No distress . Vital signs reviewed. HEENT: EOMI, oral membranes moist Neck: supple Cardiovascular: RRR without murmur. No JVD    Respiratory: CTA Bilaterally without wheezes or rales. Normal effort    GI: BS +, non-tender, non-distended  Neurologic: very alert. Better attention to left. Improving insight and awareness  Motor:  LUE: 2+/5 delt, 3+/5bi , tri. 3-/5 grip--fairly steady LLE:, 4-/5 HF, KE, 3+ to 4/5 ADF/PF--stable Senses pain in all 4's Skin: Warm and dry.  Intact. Psych: pleasant and cooperative  Assessment/Plan: 1.  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  Physiatrist is providing close team supervision and 24 hour management of active medical problems listed below.  Physiatrist and rehab team continue to assess barriers to discharge/monitor patient progress toward functional and medical goals  Care Tool:  Bathing  Bathing activity did not occur: (Not assessed) Body parts bathed by patient: Left arm, Chest, Abdomen, Front perineal area, Right upper leg,  Left upper leg, Face, Right lower leg, Left lower leg, Right arm   Body parts bathed by helper: Buttocks     Bathing assist Assist Level: Minimal Assistance - Patient > 75%     Upper Body Dressing/Undressing Upper body dressing   What is the patient wearing?: Pull over shirt    Upper body assist Assist Level: Moderate Assistance - Patient 50 - 74%    Lower Body Dressing/Undressing Lower body dressing      What is the patient wearing?: Pants, Incontinence brief     Lower body assist Assist for lower body dressing: Maximal Assistance - Patient 25 - 49%     Toileting Toileting Toileting Activity did not occur (Clothing management and hygiene only): N/A (no void or bm)  Toileting assist Assist for toileting: Moderate Assistance - Patient 50 - 74%     Transfers Chair/bed transfer  Transfers assist     Chair/bed transfer assist level: Contact Guard/Touching assist     Locomotion Ambulation   Ambulation assist      Assist level: Dependent - Patient 0% Assistive device: Lite Gait Max distance: 50'   Walk 10 feet activity   Assist     Assist level: Dependent - Patient 0% Assistive device: Lite Gait   Walk 50 feet activity   Assist Walk 50 feet with 2 turns activity did not occur: Safety/medical concerns  Assist level: Dependent - Patient 0% Assistive device: Lite Gait    Walk 150 feet  activity   Assist Walk 150 feet activity did not occur: Safety/medical concerns         Walk 10 feet on uneven surface  activity   Assist Walk 10 feet on uneven surfaces activity did not occur: Safety/medical concerns         Wheelchair     Assist Will patient use wheelchair at discharge?: No Type of Wheelchair: Manual    Wheelchair assist level: Supervision/Verbal cueing Max wheelchair distance: 100'    Wheelchair 50 feet with 2 turns activity    Assist        Assist Level: Supervision/Verbal cueing   Wheelchair 150 feet activity     Assist Wheelchair 150 feet activity did not occur: Safety/medical concerns   Assist Level: Minimal Assistance - Patient > 75%     Medical Problem List and Plan: 1.Left side weakness with dysarthriasecondary to 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 DVT Prophylaxis/Anticoagulation: SCDs. Monitor for any signs of DVT 3. Pain Management:Tylenol as needed 4. Mood:Provide emotional support 5. Neuropsych: This patientiscapable of making decisions on hisown behalf. 6. Skin/Wound Care:Routine skin checks 7. Fluids/Electrolytes/Nutrition:encourage PO  -improved intake.   .   -passed MBS---> on on reg/thin diet 8. Atrial fibrillation. ELIQUIS discontinued due to [place]. Cardiac rate controlled. Continue Tenormin 75 mg daily.   -HR controlled. BP recorded after activity this morning  -cardiology was consulted per request of family  -upon review of outpt chart, pt on 20-40mg  lasix at home at times for volume mgt  -lasix held due to hyponatremia   -weights with slight trend up over last week.    -lasix 20mg  given x 1 1/20 Filed Weights   12/12/18 0348 12/13/18 0600 12/14/18 0542   Weight: 77.2 kg 72.1 kg 70.8 kg   -  continue potassium supplement back to 20 mEq twice daily  -no SOB at present 9. Hyperlipidemia. Zocor 10. Rheumatoid arthritis. Patient on chronic prednisone as well as scheduled injections of Humira prior to admission. Pt states he can bring humira from home and is due for a dose Friday 11.  Hypertension  Continue atenolol   Fair control on 1/17, continue to monitor 12.  Hyponatremia--resolved off lasix  -136 1/17--- recheck tomorrow 13. ?mild gross hematuria  -appears to have resolved   LOS: 13 days A FACE TO [place] 12/14/2018, 12:46 PM

## 2018-12-14 NOTE — Progress Notes (Signed)
Occupational Therapy Session Note  Patient Details  Name: Barry Woods MRN: [id number] Date of Birth: 1946/11/04  Today's Date: 12/14/2018 OT Individual Time: 1345-1430 OT Individual Time Calculation (min): 45 min    Short Term Goals: Week 2:  OT Short Term Goal 1 (Week 2): STGs=LTGs secondary to upcoming discharge  Skilled Therapeutic Interventions/Progress Updates:    Therapist took pt down to the dayroom in the wheelchair.  He was able to work on Brewing technologist for the LUE during session.  He was able to complete 3 sets of 3 mins, 4 mins, and 4 mins with resistance on level 1 and RPMs at 9-12.  He needed use of an ace bandage to maintain grip in the left hand as he could only sustain it for 15-20 seconds before having to stop and reposition.  Supervision for BUE use and for LUE use isolated peddling forward.  He needed constant min assist to complete revolutions when peddling backwards.  Next had pt transfer back to the wheelchair with min assist.  Took pt back to the room and worked on functional mobility without assistive device with mod assist.  Emphasis on maintaining left knee and hip extension with weightbearing on the LLE.  Pt left in session with call button and phone in reach and safety alarm belt in place.    Therapy Documentation Precautions:  Precautions Precautions: None Precaution Comments: left inattention Restrictions Weight Bearing Restrictions: No  Pain: Pain Assessment Pain Score: 0-No pain ADL: ADL Eating: Moderate cueing, Contact guard Where Assessed-Eating: Edge of bed Upper Body Dressing: Minimal assistance Where Assessed-Upper Body Dressing: Edge of bed Lower Body Dressing: Maximal assistance Where Assessed-Lower Body Dressing: Edge of bed Toilet Transfer: Moderate assistance Toilet Transfer Method: Stand pivot  Therapy/Group: Individual Therapy  Dequarius Jeffries OTR/L 12/14/2018, 2:46 PM

## 2018-12-14 NOTE — Progress Notes (Signed)
Occupational Therapy Session Note  Patient Details  Name: Barry Woods MRN: [id number] Date of Birth: [birth year]  Today's Date: 12/14/2018 OT Individual Time: 0700-0815 OT Individual Time Calculation (min): 75 min    Short Term Goals: Week 2:  OT Short Term Goal 1 (Week 2): STGs=LTGs secondary to upcoming discharge  Skilled Therapeutic Interventions/Progress Updates:    Upon entering the room, pt supine in bed with no c/o pain and agreeable to OT intervention. Pt requesting to shower this session. Supine >sit with min A to EOB. Stand pivot transfer into wheelchair with min A. Pt transferred from wheelchair <> TTB with mod A and use of grab bar. OT providing mod cuing throughout session for safety awareness for pt to lock breaks before transfers. Pt encouraged to utilize L UE for functional tasks and able to wash self partially without drops of cloth from hand. Pt needing increased time and min cuing for redirection of task at well. Pt returning to wheelchair and donning clothing items with sit <>stand at sink with L knee blocked for safety. Pt needing mod lifting assistance to stand at sink and min A for standing balance. Grooming tasks performed from seated position with supervision. Pt remained in wheelchair with chair alarm donned and call bell within reach.   Therapy Documentation Precautions:  Precautions Precautions: None Precaution Comments: left inattention Restrictions Weight Bearing Restrictions: No General:   Vital Signs: Therapy Vitals Temp Source: Oral Pulse Rate: (!) 58 Resp: 17 BP: 116/72 Patient Position (if appropriate): Sitting Oxygen Therapy SpO2: 100 % O2 Device: Room Air Pain: Pain Assessment Pain Scale: 0-10 Pain Score: 0-No pain ADL: ADL Eating: Moderate cueing, Contact guard Where Assessed-Eating: Edge of bed Upper Body Dressing: Minimal assistance Where Assessed-Upper Body Dressing: Edge of bed Lower Body Dressing: Maximal assistance Where  Assessed-Lower Body Dressing: Edge of bed Toilet Transfer: Moderate assistance Toilet Transfer Method: Stand pivot   Therapy/Group: Individual Therapy  Gypsy Decant 12/14/2018, 5:19 PM

## 2018-12-14 NOTE — Plan of Care (Signed)
  Problem: Consults Goal: RH STROKE PATIENT EDUCATION Description See Patient Education module for education specifics  Outcome: Progressing   Problem: RH SKIN INTEGRITY Goal: RH STG SKIN FREE OF INFECTION/BREAKDOWN Description Skin to remain free from additional breakdown and infection while on rehab with min assist.  Outcome: Progressing Goal: RH STG MAINTAIN SKIN INTEGRITY WITH ASSISTANCE Description STG Maintain Skin Integrity With min Assistance.  Outcome: Progressing Goal: RH STG ABLE TO PERFORM INCISION/WOUND CARE W/ASSISTANCE Description STG Able To Perform Wound Care With min Assistance.  Outcome: Progressing   Problem: RH SAFETY Goal: RH STG ADHERE TO SAFETY PRECAUTIONS W/ASSISTANCE/DEVICE Description STG Adhere to Safety Precautions With min Assistance and appropriate assistive Device.  Outcome: Progressing   Problem: RH PAIN MANAGEMENT Goal: RH STG PAIN MANAGED AT OR BELOW PT'S PAIN GOAL Description <3 on a 0-10 pain scale.  Outcome: Progressing   Problem: RH KNOWLEDGE DEFICIT Goal: RH STG INCREASE KNOWLEDGE OF HYPERTENSION Description Patient and family will demonstrate knowledge on HTN medications and dietary restrictions with min assist from rehab staff before discharge.  Outcome: Progressing Goal: RH STG INCREASE KNOWLEGDE OF HYPERLIPIDEMIA Description Patient and family will demonstrate knowledge of HLD medications and dietary restrictions with min assistance from rehab staff before discharge.  Outcome: Progressing Goal: RH STG INCREASE KNOWLEDGE OF STROKE PROPHYLAXIS Description Patient and family will demonstrate knowledge of ways to prevent future strokes with medications, dietary restrictions, and exercise management with min assist from rehab staff before discharge.  Outcome: Progressing

## 2018-12-14 NOTE — Progress Notes (Signed)
Physical Therapy Session Note  Patient Details  Name: Barry Woods MRN: [id number] Date of Birth: 03/20/1946  Today's Date: 12/14/2018 PT Individual Time: 1100-1200 PT Individual Time Calculation (min): 60 min   Short Term Goals: Week 2:  PT Short Term Goal 1 (Week 2): Pt will ambulate x 100' with min A and LRAD PT Short Term Goal 2 (Week 2): Pt will negotiate 4 stairs with 2 handrails and min A PT Short Term Goal 3 (Week 2): Pt will demonstrate awareness of LLE with gait and transfers with cueing required less than 25% of the time  Skilled Therapeutic Interventions/Progress Updates:    Pt received seated in w/c in room, agreeable to PT. No complaints of pain. Pt requests to use the bathroom. Toilet transfer with min A with use of RW, max A for clothing management. Stand pivot transfer w/c to mat table with RW and CGA. Sit to stand with CGA and no AD from mat table. Session focus on standing weight shift to the R. Standing with min A and reaching with RUE leaning towards R side placing clothespins on basketball net. Transition to standing weight shift to the R bringing R hip towards therapists L hip, use of mirror for visual feedback for posture, min manual cueing for weight shift. Standing weight shift to the R bringing R hips towards target then lifting LLE to tap target on floor. Pt requires mod manual cueing for maintaining weight shift the R when lifting LLE. When pt cued to stand with equal weight through BLE he exhibits increased WBing through LLE vs RLE, requires min manual cueing and use of mirror to correct. Pt left seated in w/c in room with quick release belt in place and needs in reach.  Therapy Documentation Precautions:  Precautions Precautions: None Precaution Comments: left inattention Restrictions Weight Bearing Restrictions: No   Therapy/Group: Individual Therapy   Excell Seltzer, PT, DPT  12/14/2018, 12:03 PM

## 2018-12-15 ENCOUNTER — Inpatient Hospital Stay (HOSPITAL_COMMUNITY): Payer: Medicare HMO | Admitting: Occupational Therapy

## 2018-12-15 ENCOUNTER — Inpatient Hospital Stay (HOSPITAL_COMMUNITY): Payer: Medicare HMO | Admitting: Physical Therapy

## 2018-12-15 ENCOUNTER — Ambulatory Visit (HOSPITAL_COMMUNITY): Payer: Medicare HMO | Admitting: Speech Pathology

## 2018-12-15 NOTE — Progress Notes (Signed)
Speech Language Pathology Daily Session Note  Patient Details  Name: Barry Woods MRN: [id number] Date of Birth: October 01, 1946  Today's Date: 12/15/2018 SLP Individual Time: 3419-6222 SLP Individual Time Calculation (min): 45 min  Short Term Goals: Week 3: SLP Short Term Goal 1 (Week 3): Pt will consume regular textures with supervision cues for use of swallowing precautions.   SLP Short Term Goal 2 (Week 3): Pt will consume thin liquids with supervision cues for use of swallow strategies.  SLP Short Term Goal 3 (Week 3): Pt will complete mildly complex daily tasks with min assist verbal cues for functional problem solving.   SLP Short Term Goal 4 (Week 3): Pt will recall mildly complex, daily information with min assist verbal cues for use of external aids.   SLP Short Term Goal 5 (Week 3): Pt will recognize and correct errors in the moment during functional tasks with min assist verbal cues.   SLP Short Term Goal 6 (Week 3): Pt will selectively attend to tasks in a mildly distracting environment for 15 minute intervals with min verbal cues for redirection to task.    Skilled Therapeutic Interventions:    Skilled treatment session focused on dysphagia and cognition goals. SLP facilitated session by providing skilled observation of pt consuming regular breakfast with tin liquids via straw. Pt consumed with supervision to Min A cues for left head turn. Pt frequently wants to talk (continuously) while eating. Pt required Mod A strict instruction to remain silent while eating. SLP sat to pt's eft to encourage natural head turn. Pt states that it "feels better when he swallows" with a head turn then with a chin tuck. SLP further faciltiated session by providing cues to aid in pt's poor recall. Pt chooses to discuss information that appears knowledgeable but in actuality he demonstrates decreased cognitive ability. For example, pt stated "it;s January 23 isn't it? Then the new [facility] opens  today etc" - however the new hospital opens on Feb. 23. He also talked very personably with a NT but continued to act as if she was someone else. He also wished this Probation officer a great weekend despite the fact that we just talked about it being Thursday. Pt will require Min A for cognitive function at discharge. Also because he "appears" to know what he is talking about, he may present with more cognitive ability than he really has. Information shared with pt's CSW.   Pain Pain Assessment Pain Scale: 0-10 Pain Score: 0-No pain  Therapy/Group: Individual Therapy  Chassity Ludke 12/15/2018, 2:41 PM

## 2018-12-15 NOTE — Progress Notes (Signed)
[place] PHYSICAL MEDICINE & REHABILITATION PROGRESS NOTE   Subjective/Complaints:   Up in chair. Happy about diet being advanced  ROS: Patient denies fever, rash, sore throat, blurred vision, nausea, vomiting, diarrhea, cough, shortness of breath or chest pain, joint or back pain, headache, or mood change.  .  Objective:   Dg Swallowing Func-speech Pathology  Result Date: 12/14/2018 Objective Swallowing Evaluation: Type of Study: MBS-Modified Barium Swallow Study  Patient Details Name: JAIYDEN LAUR MRN: [id number] Date of Birth: 11-May-1946 Today's Date: 12/14/2018 Time: SLP Start Time (ACUTE ONLY): 8469 -SLP Stop Time (ACUTE ONLY): 1100 SLP Time Calculation (min) (ACUTE ONLY): 20 min Past Medical History: Past Medical History: Diagnosis Date . Arthritis  . Cerebral cavernoma  . Hyperlipidemia  . Hypertension  . Rheumatoid arthritis [facility])  Past Surgical History: Past Surgical History: Procedure Laterality Date . COLONOSCOPY   . LIPOMA EXCISION   . PALATE / UVULA BIOPSY / EXCISION   . TONSILLECTOMY    age 60 HPI: The patient is a 73 year old white male who was admitted yesterday with a right basal ganglia hemorrhage.  No data recorded Assessment / Plan / Recommendation CHL IP CLINICAL IMPRESSIONS 12/14/2018 Clinical Impression Pt continues to present with moderate sensorimotor oropharyngeal dysphagia. Pt continues to present with decreased base of tongues strength and decreased laryngeal movement. As such, he doesn't achieve epiglottic deflection. Pt's swallow is also delayed to the pyriform sinuses. As a result of the above, pt aspirates thin liquids during the swallow. Pt continues to present with mild pharyngeal residue in the vallecula and pyriform sinuses. Although residue appears mild on fluro, pt anteriorally aspirates residue after the swallow. All aspiration was silent. Chin tuck and supraglottic swallow were ineffective in preventing aspiration, in fact more residue accumulated in laryngeal  vestibule with these strateies. Head turn to left was only strategy that prevent aspiration and reduced pharyngeal residue. Recommend upgrading to regular diet, medicine  whole in puree and head turn to left during consumption. Would recommend continuing pharyngeal strengthening exercises.  SLP Visit Diagnosis Attention and concentration deficit Attention and concentration deficit following Cerebral infarction Frontal lobe and executive function deficit following -- Impact on safety and function Mild aspiration risk   CHL IP TREATMENT RECOMMENDATION 12/14/2018 Treatment Recommendations Therapy as outlined in treatment plan below   Prognosis 11/28/2018 Prognosis for Safe Diet Advancement Good Barriers to Reach Goals -- Barriers/Prognosis Comment -- CHL IP DIET RECOMMENDATION 12/14/2018 SLP Diet Recommendations Regular solids;Thin liquid Liquid Administration via Cup;Straw Medication Administration Whole meds with liquid Compensations Slow rate;Small sips/bites;Clear throat intermittently;Other (Comment) Postural Changes Seated upright at 90 degrees   CHL IP OTHER RECOMMENDATIONS 12/14/2018 Recommended Consults -- Oral Care Recommendations Oral care BID Other Recommendations --   CHL IP FOLLOW UP RECOMMENDATIONS 11/29/2018 Follow up Recommendations Inpatient Rehab   CHL IP FREQUENCY AND DURATION 11/29/2018 Speech Therapy Frequency (ACUTE ONLY) min 1 x/week Treatment Duration --      CHL IP ORAL PHASE 12/14/2018 Oral Phase Impaired Oral - Pudding Teaspoon -- Oral - Pudding Cup -- Oral - Honey Teaspoon -- Oral - Honey Cup -- Oral - Nectar Teaspoon -- Oral - Nectar Cup -- Oral - Nectar Straw -- Oral - Thin Teaspoon Weak lingual manipulation;Incomplete tongue to palate contact;Piecemeal swallowing;Premature spillage;Decreased bolus cohesion Oral - Thin Cup Weak lingual manipulation;Incomplete tongue to palate contact;Piecemeal swallowing;Decreased bolus cohesion;Premature spillage Oral - Thin Straw Incomplete tongue to palate  contact;Weak lingual manipulation;Lingual/palatal residue;Piecemeal swallowing;Decreased bolus cohesion;Premature spillage Oral - Puree -- Oral - Mech Soft --  Oral - Regular -- Oral - Multi-Consistency -- Oral - Pill -- Oral Phase - Comment --  CHL IP PHARYNGEAL PHASE 12/14/2018 Pharyngeal Phase Impaired Pharyngeal- Pudding Teaspoon -- Pharyngeal -- Pharyngeal- Pudding Cup -- Pharyngeal -- Pharyngeal- Honey Teaspoon -- Pharyngeal -- Pharyngeal- Honey Cup -- Pharyngeal -- Pharyngeal- Nectar Teaspoon NT Pharyngeal -- Pharyngeal- Nectar Cup -- Pharyngeal -- Pharyngeal- Nectar Straw NT Pharyngeal -- Pharyngeal- Thin Teaspoon Reduced anterior laryngeal mobility;Reduced epiglottic inversion;Reduced laryngeal elevation;Delayed swallow initiation-pyriform sinuses;Reduced airway/laryngeal closure;Penetration/Aspiration before swallow;Moderate aspiration Pharyngeal Material enters airway, passes BELOW cords without attempt by patient to eject out (silent aspiration) Pharyngeal- Thin Cup Delayed swallow initiation-pyriform sinuses;Reduced epiglottic inversion;Reduced anterior laryngeal mobility;Reduced laryngeal elevation;Reduced airway/laryngeal closure;Reduced tongue base retraction;Penetration/Aspiration during swallow;Penetration/Apiration after swallow;Moderate aspiration;Pharyngeal residue - pyriform;Pharyngeal residue - valleculae;Pharyngeal residue - posterior pharnyx;Lateral channel residue;Compensatory strategies attempted (with notebox);Other (Comment) Pharyngeal Material enters airway, passes BELOW cords without attempt by patient to eject out (silent aspiration) Pharyngeal- Thin Straw Delayed swallow initiation-pyriform sinuses;Reduced epiglottic inversion;Reduced anterior laryngeal mobility;Reduced laryngeal elevation;Reduced airway/laryngeal closure;Reduced tongue base retraction;Penetration/Aspiration during swallow;Penetration/Apiration after swallow;Moderate aspiration;Pharyngeal residue -  valleculae;Pharyngeal residue - pyriform;Pharyngeal residue - posterior pharnyx;Compensatory strategies attempted (with notebox);Other (Comment) Pharyngeal Material enters airway, passes BELOW cords without attempt by patient to eject out (silent aspiration) Pharyngeal- Puree NT Pharyngeal -- Pharyngeal- Mechanical Soft -- Pharyngeal -- Pharyngeal- Regular NT Pharyngeal -- Pharyngeal- Multi-consistency -- Pharyngeal -- Pharyngeal- Pill -- Pharyngeal -- Pharyngeal Comment --  CHL IP CERVICAL ESOPHAGEAL PHASE 12/14/2018 Cervical Esophageal Phase WFL Pudding Teaspoon -- Pudding Cup -- Honey Teaspoon -- Honey Cup -- Nectar Teaspoon -- Nectar Cup -- Nectar Straw -- Thin Teaspoon -- Thin Cup -- Thin Straw -- Puree -- Mechanical Soft -- Regular -- Multi-consistency -- Pill -- Cervical Esophageal Comment -- Happi Overton 12/14/2018, 12:25 PM              No results for input(s): WBC, HGB, HCT, PLT in the last 72 hours. No results for input(s): NA, K, CL, CO2, GLUCOSE, BUN, CREATININE, CALCIUM in the last 72 hours.  Intake/Output Summary (Last 24 hours) at 12/15/2018 0925 Last data filed at 12/15/2018 0700 Gross per 24 hour  Intake 420 ml  Output 250 ml  Net 170 ml     Physical Exam: Vital Signs Blood pressure 120/65, pulse 67, temperature 98.1 F (36.7 C), temperature source Oral, resp. rate 14, height 5\' 7"  (1.702 m), weight 72.7 kg, SpO2 98 %.  Constitutional: No distress . Vital signs reviewed. HEENT: EOMI, oral membranes moist Neck: supple Cardiovascular: RRR without murmur. No JVD    Respiratory: CTA Bilaterally without wheezes or rales. Normal effort    GI: BS +, non-tender, non-distended  Neurologic: very alert. Better attention to left. Improving insight and awareness  Motor:  LUE: 2+/5 delt, 3+/5bi , tri. 3-/5 grip--no changes LLE:, 4-/5 HF, KE, 3+ to 4/5 ADF/PF-no changes Senses pain in all 4's Skin: Warm and dry.  Intact. Psych: pleasant and cooperative  Assessment/Plan: 1. Functional  deficits secondary to right basal ganglia hemorrhage which require 3+ hours per day of interdisciplinary therapy in a comprehensive inpatient rehab setting.  Physiatrist is providing close team supervision and 24 hour management of active medical problems listed below.  Physiatrist and rehab team continue to assess barriers to discharge/monitor patient progress toward functional and medical goals  Care Tool:  Bathing  Bathing activity did not occur: (Not assessed) Body parts bathed by patient: Left arm, Chest, Abdomen, Front perineal area, Right upper leg, Left upper leg, Face, Right lower  leg, Left lower leg, Right arm   Body parts bathed by helper: Buttocks     Bathing assist Assist Level: Minimal Assistance - Patient > 75%     Upper Body Dressing/Undressing Upper body dressing   What is the patient wearing?: Pull over shirt    Upper body assist Assist Level: Minimal Assistance - Patient > 75%    Lower Body Dressing/Undressing Lower body dressing      What is the patient wearing?: Pants, Incontinence brief     Lower body assist Assist for lower body dressing: Maximal Assistance - Patient 25 - 49%     Toileting Toileting Toileting Activity did not occur (Clothing management and hygiene only): N/A (no void or bm)  Toileting assist Assist for toileting: Moderate Assistance - Patient 50 - 74%     Transfers Chair/bed transfer  Transfers assist     Chair/bed transfer assist level: Contact Guard/Touching assist     Locomotion Ambulation   Ambulation assist      Assist level: Dependent - Patient 0% Assistive device: Hand held assist Max distance: 25'   Walk 10 feet activity   Assist     Assist level: Dependent - Patient 0% Assistive device: Lite Gait   Walk 50 feet activity   Assist Walk 50 feet with 2 turns activity did not occur: Safety/medical concerns  Assist level: Dependent - Patient 0% Assistive device: Lite Gait    Walk 150 feet  activity   Assist Walk 150 feet activity did not occur: Safety/medical concerns         Walk 10 feet on uneven surface  activity   Assist Walk 10 feet on uneven surfaces activity did not occur: Safety/medical concerns         Wheelchair     Assist Will patient use wheelchair at discharge?: No Type of Wheelchair: Manual    Wheelchair assist level: Supervision/Verbal cueing Max wheelchair distance: 100'    Wheelchair 50 feet with 2 turns activity    Assist        Assist Level: Supervision/Verbal cueing   Wheelchair 150 feet activity     Assist Wheelchair 150 feet activity did not occur: Safety/medical concerns   Assist Level: Minimal Assistance - Patient > 75%     Medical Problem List and Plan: 1.Left side weakness with dysarthriasecondary to right basal ganglia ICH as well as left subcortical CVA  ---Continue CIR therapies including PT, OT, and SLP     -pt is not to resume anticoagulation due to [place] and risk factor control/treatment recommended at this point with outpt neurology follow up upon discharge home 2. DVT Prophylaxis/Anticoagulation: SCDs. Monitor for any signs of DVT 3. Pain Management:Tylenol as needed 4. Mood:Provide emotional support 5. Neuropsych: This patientiscapable of making decisions on hisown behalf. 6. Skin/Wound Care:Routine skin checks 7. Fluids/Electrolytes/Nutrition:encourage PO  -improved intake.   .   -passed MBS---> on on reg/thin diet 8. Atrial fibrillation. ELIQUIS discontinued due to [place]. Cardiac rate controlled. Continue Tenormin 75 mg daily.   -HR controlled. BP recorded after activity this morning  -cardiology was consulted per request of family  -upon review of outpt chart, pt on 20-40mg  lasix at home at times for volume mgt  -lasix held due to hyponatremia    -lasix 20mg  given x 1 1/20 with improvement in weights Filed Weights   12/13/18 0600 12/14/18 0542 12/14/18 2123  Weight: 72.1 kg 70.8  kg 72.7 kg   -continue potassium supplement back to 20 mEq twice daily  -  no SOB at present 9. Hyperlipidemia. Zocor 10. Rheumatoid arthritis. Patient on chronic prednisone as well as scheduled injections of Humira prior to admission. Pt brought in [place] from home and is due for a dose Friday 11.  Hypertension  Continue atenolol   Fair control on 1/17, continue to monitor 12.  Hyponatremia--resolved off lasix  -136 1/17--- follow up tomorrow 13. ?mild gross hematuria  -appears to have resolved   LOS: 14 days A FACE TO FACE EVALUATION WAS PERFORMED  Meredith Staggers 12/15/2018, 9:25 AM

## 2018-12-15 NOTE — Progress Notes (Signed)
Occupational Therapy Session Note  Patient Details  Name: Barry Woods MRN: [id number] Date of Birth: January 31, 1946  Today's Date: 12/15/2018 OT Individual Time: 1000-1128 and 1500-1530 OT Individual Time Calculation (min): 88 min and 30 min   Short Term Goals: Week 2:  OT Short Term Goal 1 (Week 2): STGs=LTGs secondary to upcoming discharge  Skilled Therapeutic Interventions/Progress Updates:    Session 1: Upon entering the room, pt seated in wheelchair with no c/o pain this session and agreeable to OT intervention. Pt assisted via wheelchair to gym for seated dynavision task with lower half of board activated. Pt must hit targets on R side of board with R UE and L side with L UE. Pt seeing hitting 52 targets within 2 minutes. Reaction time on R side is average of 1.66 seconds versus 3.18 seconds on the L. Pt able to hit targets with index finger on R but unable to coordinate hitting targets without use of multiple digits, palm, or fist with L. OT assisted pt to gym and pt standing for 9 minutes and 10 minutes respectively for table top activity. Pt standing with R UE on small step to keep knee bent and force weight shift onto L LE while standing and knee blocked for safety. Pt required min A for standing balance once B UEs engaged in card task. Pt playing game of old maid and once seated focus shifted to pronation/supination to flip cards on table with L hand, dealing, and three jar chuck grasp to select cards from players hand. Pt requiring increased time but he was successful with this task. OT assisting pt back to room at end of session. Call bell and all needed items within reach upon exiting the room.   Session 2: Upon entering the room, pt seated in wheelchair awaiting OT intervention. Pt showing pictures via cell phone of current home set up for recommendation of equipment per family request by today. OT continued to educate patient that at time of discharge there is no recommendation for  pt to go up flight of steps to get into shower secondary to safety concerns. Pt will have hospital bed on level one and be using half bath for safety with recommendation to do sink/basin bathing until pt able to safely ambulate up/down steps. Pt verbalized understanding. OT did recommend 3 in 1 commode chair for night use or if he is unable to safely get into bathroom with RW at time of discharge. Pt remained in wheelchair with chair alarm donned and call bell within reach.    Therapy Documentation Precautions:  Precautions Precautions: None Precaution Comments: left inattention Restrictions Weight Bearing Restrictions: No General:   Vital Signs: Therapy Vitals Temp: 97.8 F (36.6 C) Pulse Rate: (!) 55 Resp: 17 BP: 105/69 Patient Position (if appropriate): Sitting Oxygen Therapy SpO2: 97 % O2 Device: Room Air Pain: Pain Assessment Pain Scale: 0-10 Pain Score: 0-No pain ADL: ADL Eating: Moderate cueing, Contact guard Where Assessed-Eating: Edge of bed Upper Body Dressing: Minimal assistance Where Assessed-Upper Body Dressing: Edge of bed Lower Body Dressing: Maximal assistance Where Assessed-Lower Body Dressing: Edge of bed Toilet Transfer: Moderate assistance Toilet Transfer Method: Stand pivot   Therapy/Group: Individual Therapy  Gypsy Decant 12/15/2018, 4:15 PM

## 2018-12-15 NOTE — Patient Care Conference (Signed)
Inpatient RehabilitationTeam Conference and Plan of Care Update Date: 12/13/2018   Time: 2:25 PM    Patient Name: Barry Woods      Medical Record Number: [id number]  Date of Birth: 07/31/1946 Sex: Male         Room/Bed: 4W17C/4W17C-01 Payor Info: Payor: [employer] MEDICARE / Plan: [employer] MEDICARE HMO/PPO / Product Type: *No Product type* /    Admitting Diagnosis: RT Basal ganglia hem  Admit Date/Time:  12/01/2018  5:17 PM Admission Comments: No comment available   Primary Diagnosis:  <principal problem not specified> Principal Problem: <principal problem not specified>  Patient Active Problem List   Diagnosis Date Noted  . Hemorrhagic stroke ([place])   . Hyponatremia   . Dyslipidemia   . Tachypnea   . AKI (acute kidney injury) ([place])   . Cerebral cavernoma   . ICH (intracerebral hemorrhage) ([place]) 11/27/2018  . Diastolic CHF, chronic ([place]) 11/09/2018  . Atrial fibrillation ([place]) 11/07/2018  . Dyspnea on exertion 10/31/2018  . Coronary artery calcification seen on CT scan 10/31/2018  . Routine general medical examination at a health care facility 03/09/2018  . Benign essential tremor 02/16/2017  . Pure hypercholesterolemia 02/16/2017  . ILD (interstitial lung disease) ([place]) 10/21/2016  . BPH associated with nocturia 09/24/2015  . Rheumatoid arthritis ([place]) 09/28/2011  . Shoulder pain, bilateral 06/16/2011  . Gout attack 02/02/2011  . 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 LIVER FUNCTION TESTS, ABNORMAL, HX OF 11/19/2008  . Hyperlipidemia, mild 11/01/2007  . Essential hypertension 11/01/2007    Expected Discharge Date: Expected Discharge Date: 12/22/18  Team Members Present: Physician leading conference: Dr. Alger Simons Social Worker Present: Lennart Pall, LCSW Nurse Present: Dorthula Nettles, RN PT Present: Other (comment)(Taylor Ervin Knack, PT) OT Present: Darleen Crocker, OT SLP Present: Weston Anna, SLP PPS Coordinator present : Gunnar Fusi     Current Status/Progress Goal  Weekly Team Focus  Medical   Patient demonstrating improvement in engagement of his left side.  Still with inattention but he is progressing.  Volume and weight are fairly well-controlled.  Cardiology has followed along with the patient  Maintain volume/weight status.  Ongoing stroke education  See above, continue to manage electrolytes and volume   Bowel/Bladder   continent of bowel & bladder, LBM 12/11/18  min assist  continue to monitor & assist as needed   Swallow/Nutrition/ Hydration   min A chin tuck / throat clear, dys 3 and  thin  Mod I  MBS ordered 1/22, RMT and masako exercises to increase pharyngeal strength   ADL's   UB dressing with min A, LB dressing max A, bathroom transfers with min - mod A stand pivot, bathing with min A, improved use of L UE with cuing  supervision overall  L NMR, self care retraining, balance, functional transfers, pt/family education   Mobility   CGA transfers with RW, min to mod A gait with RW up to 166', min A stairs with 2 handrails  Supervision overall  gait training, balance, L NMR   Communication   Sup A  Mod I  carry over of speech intelligibility stratgies in conversation    Safety/Cognition/ Behavioral Observations  mod-sup A   Sup A and downgraded semi-complex problem solving/errro awareness to Min A - 1/21  semi-complex solving, error awareness, selective and left inattention, recall startegies    Pain   no c/o pain, has tylenol prn/ has not used it  Pain scale <2  assess q shift & treat as needed   Skin  skin tear to left elbow, rash on buttocks & back, has hydrocortisone prn  no new areas of skin break down  assess q shift      *See Care Plan and progress notes for long and short-term goals.     Barriers to Discharge  Current Status/Progress Possible Resolutions Date Resolved   Physician    Medical stability        Continue medical management as we are doing      Nursing                  PT  Inaccessible home environment  pt  has lots of stairs at home, family planning on hiring assist              OT                  SLP                SW                Discharge Planning/Teaching Needs:  Pt to d/c to his own townhome with daughters, sister and private duty care providing 24/7 assistance VS.  Family also making home modifications.      Team Discussion:  Progressing with therapies.  Improved BS control and labs overall.  Increased engagement of left side;  Cont b/b so far but has been refusing some bowel meds.  CGA - min assist with tfs;  Min/mod with ambulation 150'.  Toilet tf with min/ mod assist dependent on his level of focus/ attention to task.  Very poor awareness of LLE with mobility and can still be impulsive and need cues to slow down.  Planning MBS tomorrow.  Revisions to Treatment Plan:  NA    Continued Need for Acute Rehabilitation Level of Care: The patient requires daily medical management by a physician with specialized training in physical medicine and rehabilitation for the following conditions: Daily direction of a multidisciplinary physical rehabilitation program to ensure safe treatment while eliciting the highest outcome that is of practical value to the patient.: Yes Daily medical management of patient stability for increased activity during participation in an intensive rehabilitation regime.: Yes Daily analysis of laboratory values and/or radiology reports with any subsequent need for medication adjustment of medical intervention for : Cardiac problems;Neurological problems   I attest that I was present, lead the team conference, and concur with the assessment and plan of the team.   Virga Haltiwanger 12/15/2018, 12:27 PM

## 2018-12-15 NOTE — Progress Notes (Signed)
Physical Therapy Session Note  Patient Details  Name: Barry Woods MRN: [id number] Date of Birth: 05/27/1946  Today's Date: 12/15/2018 PT Individual Time: 0830-0900 PT Individual Time Calculation (min): 30 min   Short Term Goals: Week 2:  PT Short Term Goal 1 (Week 2): Pt will ambulate x 100' with min A and LRAD PT Short Term Goal 2 (Week 2): Pt will negotiate 4 stairs with 2 handrails and min A PT Short Term Goal 3 (Week 2): Pt will demonstrate awareness of LLE with gait and transfers with cueing required less than 25% of the time  Skilled Therapeutic Interventions/Progress Updates:    Pt up in w/c upon arrival and agreeable to PT session. For time, pt transported to gym via w/c with total assist. Ambulation: 75 ft with rw X2 and hand orthosis with seated rest between. Working on Lt foot clearance with swing phase, improving with cues for high steps and posture. Incorporating environmental scanning and problem solving with navigating around objects in gym. NRE: standing static>dynamic balance. Toe taps on cone X10 bilaterally- needing min assist for balance and repeated cues for posture. Following session, pt returned to room. Up in w/c with alarm belt in place and all needs in reach.   Therapy Documentation Precautions:  Precautions Precautions: None Precaution Comments: left inattention Restrictions Weight Bearing Restrictions: No General:   Vital Signs: Pain: Denies pain      Therapy/Group: Individual Therapy  Linard Millers, PT 12/15/2018, 11:37 AM

## 2018-12-15 NOTE — Progress Notes (Signed)
Social Work Patient ID: Barry Woods, male   DOB: [birth year], 73 y.o.   MRN: [id number]  Have reviewed team conference with pt and daughter, [name]. Aware and agreeable with team continuing to target d/c date of 1/30 and supervision  goals overall.  There had been some discussion/ questions about option for SNF, however, pt solidly declines this and daughter confirms he will d/c directly home.  Daughters are arranging 24/7 private duty caregiver. Per daughter request, have planned to meet with pt/ family and MD in the pt's room tomorrow morning at 9 to address longer term prognosis and answer question.  Continue to follow.  Kaeson Kleinert, LCSW

## 2018-12-16 ENCOUNTER — Inpatient Hospital Stay (HOSPITAL_COMMUNITY): Payer: Medicare HMO | Admitting: Physical Therapy

## 2018-12-16 ENCOUNTER — Inpatient Hospital Stay (HOSPITAL_COMMUNITY): Payer: Medicare HMO | Admitting: Speech Pathology

## 2018-12-16 ENCOUNTER — Inpatient Hospital Stay (HOSPITAL_COMMUNITY): Payer: Medicare HMO | Admitting: Occupational Therapy

## 2018-12-16 LAB — CBC
HCT: 44 % (ref 39.0–52.0)
Hemoglobin: 14.4 g/dL (ref 13.0–17.0)
MCH: 29.9 pg (ref 26.0–34.0)
MCHC: 32.7 g/dL (ref 30.0–36.0)
MCV: 91.5 fL (ref 80.0–100.0)
Platelets: 224 10*3/uL (ref 150–400)
RBC: 4.81 MIL/uL (ref 4.22–5.81)
RDW: 12.7 % (ref 11.5–15.5)
WBC: 3.8 10*3/uL — ABNORMAL LOW (ref 4.0–10.5)
nRBC: 0 % (ref 0.0–0.2)

## 2018-12-16 LAB — BASIC METABOLIC PANEL
Anion gap: 7 (ref 5–15)
BUN: 12 mg/dL (ref 8–23)
CO2: 25 mmol/L (ref 22–32)
CREATININE: 1.08 mg/dL (ref 0.61–1.24)
Calcium: 9.1 mg/dL (ref 8.9–10.3)
Chloride: 104 mmol/L (ref 98–111)
GFR calc Af Amer: >60 mL/min (ref >60)
GFR calc non Af Amer: >60 mL/min (ref >60)
Glucose, Bld: 87 mg/dL (ref 70–99)
Potassium: 4.2 mmol/L (ref 3.5–5.1)
Sodium: 136 mmol/L (ref 135–145)

## 2018-12-16 NOTE — Progress Notes (Signed)
Physical Therapy Weekly Progress Note  Patient Details  Name: Barry Woods MRN: [id number] Date of Birth: 13-Jul-1946  Beginning of progress report period: December 09, 2018 End of progress report period: December 16, 2018  Today's Date: 12/16/2018 PT Individual Time: 1415-1530 PT Individual Time Calculation (min): 75 min   Patient has met 3 of 3 short term goals.  Pt exhibits improved endurance for gait, improved balance and safety with performance of stairs, and requires decreased cueing for LLE awareness with gait and transfers. Pt continues to make great progress with therapy with improved balance, coordination, endurance, and strength as well as great motivation and participation.  Patient continues to demonstrate the following deficits muscle weakness, abnormal tone, unbalanced muscle activation and decreased coordination and decreased standing balance, decreased postural control and decreased balance strategies and therefore will continue to benefit from skilled PT intervention to increase functional independence with mobility.  Patient progressing toward long term goals..  Continue plan of care.  PT Short Term Goals Week 2:  PT Short Term Goal 1 (Week 2): Pt will ambulate x 100' with min A and LRAD PT Short Term Goal 1 - Progress (Week 2): Met PT Short Term Goal 2 (Week 2): Pt will negotiate 4 stairs with 2 handrails and min A PT Short Term Goal 2 - Progress (Week 2): Met PT Short Term Goal 3 (Week 2): Pt will demonstrate awareness of LLE with gait and transfers with cueing required less than 25% of the time PT Short Term Goal 3 - Progress (Week 2): Met Week 3:  PT Short Term Goal 1 (Week 3): =LTG due to ELOS  Skilled Therapeutic Interventions/Progress Updates:  Pt received seated in w/c in room, agreeable to PT. Stand pivot transfer w/c to/from therapy mat table with CGA. Sit to stand 2 x 5 reps with CGA to close SBA, focus on equal WBing through BLE in standing. Sit to stand x  5 reps each with 1" step under LLE then under RLE to encourage lateral weight shift in standing, CGA. Ascend/descend 4 stairs forwards with 2 handrails and min A, min cueing for safe step-to gait pattern. Ascend/descend 4 stairs laterally with one handrail and min A. Discussed home setup and pt's plan for 24/7 Supervision upon d/c. Education with patient that he will be able to ambulate in the home with RW as well as perform stairs as long as he has help and his family/caregiver can attend training next week prior to d/c. Pt agreeable. Ambulation 2 x 125' with RW with L hand orthosis, min A for lateral weight shift during gait and min cueing for attention to LLE. Pt reports some return of sensation in LLE this date. Side steps L/R x 20 ft with RW and min A for hip strengthening and balance as well as sequencing of task. Toilet transfer with min A and use of RW and grab bar. Pt is setup A for pericare, max A for clothing management. Pt left seated in w/c in room with needs in reach, quick release belt and chair alarm in place.  Therapy Documentation Precautions:  Precautions Precautions: None Precaution Comments: left inattention Restrictions Weight Bearing Restrictions: No Pain: Pain Assessment Pain Scale: 0-10 Pain Score: 0-No pain    Therapy/Group: Individual Therapy   Excell Seltzer, PT, DPT 12/16/2018, 3:41 PM

## 2018-12-16 NOTE — Progress Notes (Signed)
Physical Therapy Session Note  Patient Details  Name: Barry Woods MRN: [id number] Date of Birth: December 13, 1945  Today's Date: 12/16/2018 PT Individual Time: 0800-0830 PT Individual Time Calculation (min): 30 min   Short Term Goals: Week 2:  PT Short Term Goal 1 (Week 2): Pt will ambulate x 100' with min A and LRAD PT Short Term Goal 2 (Week 2): Pt will negotiate 4 stairs with 2 handrails and min A PT Short Term Goal 3 (Week 2): Pt will demonstrate awareness of LLE with gait and transfers with cueing required less than 25% of the time  Skilled Therapeutic Interventions/Progress Updates:    Patient received in [facility], very pleasant and willing to participate in PT session today. Able to complete functional transfers with min guard and RW/hand orthosis, then tolerated gait training approximately 168f with RW and min guard, Min-Mod cues for progression of L LE and good foot clearance during swing phase as well as increased stance/swing on stroke side during gait, no AFO or toe cover used today. Able to then transfer to Nustep with RW and min guard, tolerated riding Nustep with BLEs on resistance 3 for 10 minutes with cues for SPM at least 45 for improved B LE strength, coordination, and endurance. Difficulty with dual tasking noted throughout session. [facility] and all needs otherwise met.   Therapy Documentation Precautions:  Precautions Precautions: None Precaution Comments: left inattention Restrictions Weight Bearing Restrictions: No General:   Pain: Pain Assessment Pain Scale: 0-10 Pain Score: 0-No pain    Therapy/Group: Individual Therapy   KDeniece ReePT, DPT, CBIS  Supplemental Physical Therapist [facility]   Pager [phone] Rehab Office [phone]   12/16/2018, 9:11 AM

## 2018-12-16 NOTE — Progress Notes (Signed)
Speech Language Pathology Daily Session Note  Patient Details  Name: [name] MRN: [id number] Date of Birth: 12/03/1945  Today's Date: 12/16/2018 SLP Concurrent Time: 6168-3729 SLP Concurrent Time Calculation (min): 32 min   Short Term Goals: Week 3: SLP Short Term Goal 1 (Week 3): Pt will consume regular textures with supervision cues for use of swallowing precautions.   SLP Short Term Goal 2 (Week 3): Pt will consume thin liquids with supervision cues for use of swallow strategies.  SLP Short Term Goal 3 (Week 3): Pt will complete mildly complex daily tasks with min assist verbal cues for functional problem solving.   SLP Short Term Goal 4 (Week 3): Pt will recall mildly complex, daily information with min assist verbal cues for use of external aids.   SLP Short Term Goal 5 (Week 3): Pt will recognize and correct errors in the moment during functional tasks with min assist verbal cues.   SLP Short Term Goal 6 (Week 3): Pt will selectively attend to tasks in a mildly distracting environment for 15 minute intervals with min verbal cues for redirection to task.    Skilled Therapeutic Interventions:  Skilled treatment session focused on cognition goals. SLP facilitated session by providing fellow pt seated to pt's left to facilitated left attention. Pt required Min A cues to decrease off-topic responses. Pt with not self-awareness that he provided information within task that wa not socially indicated. Pt also experienced difficulty comprehending semi-complex information likely d/t distraction of being with another pt. Pt was returned to room and handed off to nursing.      Pain Pain Assessment Pain Scale: 0-10 Pain Score: 0-No pain  Therapy/Group: Individual Therapy  Shaena Parkerson Rutherford Nail 12/16/2018, 12:38 PM

## 2018-12-16 NOTE — Progress Notes (Signed)
[place] PHYSICAL MEDICINE & REHABILITATION PROGRESS NOTE   Subjective/Complaints:   Eating breakfast. No new complaints. Using left hand more to help with breakfast this am  ROS: Patient denies fever, rash, sore throat, blurred vision, nausea, vomiting, diarrhea, cough, shortness of breath or chest pain, joint or back pain, headache, or mood change.   .  Objective:   Dg Swallowing Func-speech Pathology  Result Date: 12/14/2018 Objective Swallowing Evaluation: Type of Study: MBS-Modified Barium Swallow Study  Patient Details Name: Barry Woods MRN: [id number] Date of Birth: Apr 03, 1946 Today's Date: 12/14/2018 Time: SLP Start Time (ACUTE ONLY): 1962 -SLP Stop Time (ACUTE ONLY): 1100 SLP Time Calculation (min) (ACUTE ONLY): 20 min Past Medical History: Past Medical History: Diagnosis Date . Arthritis  . Cerebral cavernoma  . Hyperlipidemia  . Hypertension  . Rheumatoid arthritis [facility])  Past Surgical History: Past Surgical History: Procedure Laterality Date . COLONOSCOPY   . LIPOMA EXCISION   . PALATE / UVULA BIOPSY / EXCISION   . TONSILLECTOMY    age 73 HPI: The patient is a 73 year old white male who was admitted yesterday with a right basal ganglia hemorrhage.  No data recorded Assessment / Plan / Recommendation CHL IP CLINICAL IMPRESSIONS 12/14/2018 Clinical Impression Pt continues to present with moderate sensorimotor oropharyngeal dysphagia. Pt continues to present with decreased base of tongues strength and decreased laryngeal movement. As such, he doesn't achieve epiglottic deflection. Pt's swallow is also delayed to the pyriform sinuses. As a result of the above, pt aspirates thin liquids during the swallow. Pt continues to present with mild pharyngeal residue in the vallecula and pyriform sinuses. Although residue appears mild on fluro, pt anteriorally aspirates residue after the swallow. All aspiration was silent. Chin tuck and supraglottic swallow were ineffective in preventing aspiration,  in fact more residue accumulated in laryngeal vestibule with these strateies. Head turn to left was only strategy that prevent aspiration and reduced pharyngeal residue. Recommend upgrading to regular diet, medicine  whole in puree and head turn to left during consumption. Would recommend continuing pharyngeal strengthening exercises.  SLP Visit Diagnosis Attention and concentration deficit Attention and concentration deficit following Cerebral infarction Frontal lobe and executive function deficit following -- Impact on safety and function Mild aspiration risk   CHL IP TREATMENT RECOMMENDATION 12/14/2018 Treatment Recommendations Therapy as outlined in treatment plan below   Prognosis 11/28/2018 Prognosis for Safe Diet Advancement Good Barriers to Reach Goals -- Barriers/Prognosis Comment -- CHL IP DIET RECOMMENDATION 12/14/2018 SLP Diet Recommendations Regular solids;Thin liquid Liquid Administration via Cup;Straw Medication Administration Whole meds with liquid Compensations Slow rate;Small sips/bites;Clear throat intermittently;Other (Comment) Postural Changes Seated upright at 90 degrees   CHL IP OTHER RECOMMENDATIONS 12/14/2018 Recommended Consults -- Oral Care Recommendations Oral care BID Other Recommendations --   CHL IP FOLLOW UP RECOMMENDATIONS 11/29/2018 Follow up Recommendations Inpatient Rehab   CHL IP FREQUENCY AND DURATION 11/29/2018 Speech Therapy Frequency (ACUTE ONLY) min 1 x/week Treatment Duration --      CHL IP ORAL PHASE 12/14/2018 Oral Phase Impaired Oral - Pudding Teaspoon -- Oral - Pudding Cup -- Oral - Honey Teaspoon -- Oral - Honey Cup -- Oral - Nectar Teaspoon -- Oral - Nectar Cup -- Oral - Nectar Straw -- Oral - Thin Teaspoon Weak lingual manipulation;Incomplete tongue to palate contact;Piecemeal swallowing;Premature spillage;Decreased bolus cohesion Oral - Thin Cup Weak lingual manipulation;Incomplete tongue to palate contact;Piecemeal swallowing;Decreased bolus cohesion;Premature spillage Oral  - Thin Straw Incomplete tongue to palate contact;Weak lingual manipulation;Lingual/palatal residue;Piecemeal swallowing;Decreased bolus cohesion;Premature spillage  Oral - Puree -- Oral - Mech Soft -- Oral - Regular -- Oral - Multi-Consistency -- Oral - Pill -- Oral Phase - Comment --  CHL IP PHARYNGEAL PHASE 12/14/2018 Pharyngeal Phase Impaired Pharyngeal- Pudding Teaspoon -- Pharyngeal -- Pharyngeal- Pudding Cup -- Pharyngeal -- Pharyngeal- Honey Teaspoon -- Pharyngeal -- Pharyngeal- Honey Cup -- Pharyngeal -- Pharyngeal- Nectar Teaspoon NT Pharyngeal -- Pharyngeal- Nectar Cup -- Pharyngeal -- Pharyngeal- Nectar Straw NT Pharyngeal -- Pharyngeal- Thin Teaspoon Reduced anterior laryngeal mobility;Reduced epiglottic inversion;Reduced laryngeal elevation;Delayed swallow initiation-pyriform sinuses;Reduced airway/laryngeal closure;Penetration/Aspiration before swallow;Moderate aspiration Pharyngeal Material enters airway, passes BELOW cords without attempt by patient to eject out (silent aspiration) Pharyngeal- Thin Cup Delayed swallow initiation-pyriform sinuses;Reduced epiglottic inversion;Reduced anterior laryngeal mobility;Reduced laryngeal elevation;Reduced airway/laryngeal closure;Reduced tongue base retraction;Penetration/Aspiration during swallow;Penetration/Apiration after swallow;Moderate aspiration;Pharyngeal residue - pyriform;Pharyngeal residue - valleculae;Pharyngeal residue - posterior pharnyx;Lateral channel residue;Compensatory strategies attempted (with notebox);Other (Comment) Pharyngeal Material enters airway, passes BELOW cords without attempt by patient to eject out (silent aspiration) Pharyngeal- Thin Straw Delayed swallow initiation-pyriform sinuses;Reduced epiglottic inversion;Reduced anterior laryngeal mobility;Reduced laryngeal elevation;Reduced airway/laryngeal closure;Reduced tongue base retraction;Penetration/Aspiration during swallow;Penetration/Apiration after swallow;Moderate  aspiration;Pharyngeal residue - valleculae;Pharyngeal residue - pyriform;Pharyngeal residue - posterior pharnyx;Compensatory strategies attempted (with notebox);Other (Comment) Pharyngeal Material enters airway, passes BELOW cords without attempt by patient to eject out (silent aspiration) Pharyngeal- Puree NT Pharyngeal -- Pharyngeal- Mechanical Soft -- Pharyngeal -- Pharyngeal- Regular NT Pharyngeal -- Pharyngeal- Multi-consistency -- Pharyngeal -- Pharyngeal- Pill -- Pharyngeal -- Pharyngeal Comment --  CHL IP CERVICAL ESOPHAGEAL PHASE 12/14/2018 Cervical Esophageal Phase WFL Pudding Teaspoon -- Pudding Cup -- Honey Teaspoon -- Honey Cup -- Nectar Teaspoon -- Nectar Cup -- Nectar Straw -- Thin Teaspoon -- Thin Cup -- Thin Straw -- Puree -- Mechanical Soft -- Regular -- Multi-consistency -- Pill -- Cervical Esophageal Comment -- Happi Overton 12/14/2018, 12:25 PM              Recent Labs    12/16/18 0714  WBC 3.8*  HGB 14.4  HCT 44.0  PLT 224   No results for input(s): NA, K, CL, CO2, GLUCOSE, BUN, CREATININE, CALCIUM in the last 72 hours.  Intake/Output Summary (Last 24 hours) at 12/16/2018 0838 Last data filed at 12/15/2018 1700 Gross per 24 hour  Intake 480 ml  Output -  Net 480 ml     Physical Exam: Vital Signs Blood pressure 140/84, pulse (!) 59, temperature 97.8 F (36.6 C), temperature source Oral, resp. rate 17, height 5\' 7"  (1.702 m), weight 72.7 kg, SpO2 97 %.  Constitutional: No distress . Vital signs reviewed. HEENT: EOMI, oral membranes moist Neck: supple Cardiovascular: RRR without murmur. No JVD    Respiratory: CTA Bilaterally without wheezes or rales. Normal effort    GI: BS +, non-tender, non-distended  Neurologic: very alert. Better attention to left. Improving insight and awareness  Motor:  LUE: 2+/5 delt, 3+/5bi , tri. 3-/5 grip--stable--engages more quickly LLE:, 4-/5 HF, KE, 3+ to 4/5 ADF/PF-no changes Senses pain in all 4's Skin: Warm and dry.  Intact. Psych:  pleasant  Assessment/Plan: 1. 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  Physiatrist is providing close team supervision and 24 hour management of active medical problems listed below.  Physiatrist and rehab team continue to assess barriers to discharge/monitor patient progress toward functional and medical goals  Care Tool:  Bathing  Bathing activity did not occur: (Not assessed) Body parts bathed by patient:  Left arm, Chest, Abdomen, Front perineal area, Right upper leg, Left upper leg, Face, Right lower leg, Left lower leg, Right arm   Body parts bathed by helper: Buttocks     Bathing assist Assist Level: Minimal Assistance - Patient > 75%     Upper Body Dressing/Undressing Upper body dressing   What is the patient wearing?: Pull over shirt    Upper body assist Assist Level: Minimal Assistance - Patient > 75%    Lower Body Dressing/Undressing Lower body dressing      What is the patient wearing?: Pants, Incontinence brief     Lower body assist Assist for lower body dressing: Maximal Assistance - Patient 25 - 49%     Toileting Toileting Toileting Activity did not occur (Clothing management and hygiene only): N/A (no void or bm)  Toileting assist Assist for toileting: Moderate Assistance - Patient 50 - 74%     Transfers Chair/bed transfer  Transfers assist     Chair/bed transfer assist level: (P) Contact Guard/Touching assist     Locomotion Ambulation   Ambulation assist      Assist level: Dependent - Patient 0% Assistive device: Hand held assist Max distance: 25'   Walk 10 feet activity   Assist     Assist level: Dependent - Patient 0% Assistive device: Lite Gait   Walk 50 feet activity   Assist Walk 50 feet with 2 turns activity did not occur: Safety/medical concerns  Assist level: Dependent - Patient  0% Assistive device: Lite Gait    Walk 150 feet activity   Assist Walk 150 feet activity did not occur: Safety/medical concerns         Walk 10 feet on uneven surface  activity   Assist Walk 10 feet on uneven surfaces activity did not occur: Safety/medical concerns         Wheelchair     Assist Will patient use wheelchair at discharge?: No Type of Wheelchair: Manual    Wheelchair assist level: Supervision/Verbal cueing Max wheelchair distance: 100'    Wheelchair 50 feet with 2 turns activity    Assist        Assist Level: Supervision/Verbal cueing   Wheelchair 150 feet activity     Assist Wheelchair 150 feet activity did not occur: Safety/medical concerns   Assist Level: Minimal Assistance - Patient > 75%     Medical Problem List and Plan: 1.Left side weakness with dysarthriasecondary to 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 DVT Prophylaxis/Anticoagulation: SCDs. Monitor for any signs of DVT 3. Pain Management:Tylenol as needed 4. Mood:Provide emotional support 5. Neuropsych: This patientiscapable of making decisions on hisown behalf. 6. Skin/Wound Care:Routine skin checks 7. Fluids/Electrolytes/Nutrition:encourage PO  -improved intake.   .   -passed MBS---> on on reg/thin diet---tolerating without issues 8. Atrial fibrillation. ELIQUIS discontinued due to [place]. Cardiac rate controlled. Continue Tenormin 75 mg daily.   -improved control 1/24  - pt on 20-40mg  lasix at home at times for volume mgt  -lasix held due to hyponatremia    -lasix 20mg  given x 1 1/20 with improvement in weights Filed Weights   12/13/18 0600 12/14/18 0542  12/14/18 2123  Weight: 72.1 kg 70.8 kg 72.7 kg   -  continue potassium supplement back to 20 mEq twice daily  -no SOB at present 9. Hyperlipidemia. Zocor 10. Rheumatoid arthritis. Patient on chronic prednisone as well as scheduled injections of Humira prior to admission. Pt brought in [place] from home and is due for a dose today  -I personally reviewed the patient's labs today.   11.  Hypertension  Continue atenolol   Fair control on 1/17, continue to monitor 12.  Hyponatremia--resolved off lasix  -136 1/17--- labs pending today 13. ?mild gross hematuria---may be from external source. UA was negative  -appears to have resolved   LOS: 15 days A FACE TO FACE EVALUATION WAS PERFORMED  Meredith Staggers 12/16/2018, 8:38 AM

## 2018-12-16 NOTE — Progress Notes (Signed)
Occupational Therapy Session Note  Patient Details  Name: Barry Woods MRN: [id number] Date of Birth: 22-Jul-1946  Today's Date: 12/16/2018 OT Individual Time: 3354-5625 OT Individual Time Calculation (min): 45 min    Skilled Therapeutic Interventions/Progress Updates:    1:1 Therapeutic activity: Focus on dynamic standing balance, sit to stands, transitional movements, floor transfers, symmetrical trunk activation. Pt performed side stepping ~32 feet with bilateral UE support during hand rail with focus on weight shifts and picking up both feet ( both directions. Floor transfer with min guard with cues for sequencing. In supine, performing symmetrical bridging/ hovering straight leg raises. Transitioned to sidelying and focus on left UE reaching to obtain object on table above and placing behind him for full functional ROM. Mod A to get back into sitting on mat position and min guard into the w/c.  Therapy Documentation Precautions:  Precautions Precautions: None Precaution Comments: left inattention Restrictions Weight Bearing Restrictions: No Pain: Pain Assessment Pain Scale: 0-10 Pain Score: 0-No pain   Therapy/Group: Individual Therapy  [name] [facility] 12/16/2018, 2:23 PM

## 2018-12-17 ENCOUNTER — Inpatient Hospital Stay (HOSPITAL_COMMUNITY): Payer: Medicare HMO | Admitting: Occupational Therapy

## 2018-12-17 DIAGNOSIS — E785 Hyperlipidemia, unspecified: Secondary | ICD-10-CM

## 2018-12-17 NOTE — Progress Notes (Signed)
Barry Woods is a 73 y.o. male 20-Feb-1946 [id number]  Subjective: Patient reports feeling well today.  Left hand strength is improving.  No new complaints or concerns.  Objective: Vital signs in last 24 hours: Temp:  [98.3 F (36.8 C)-98.4 F (36.9 C)] 98.4 F (36.9 C) (01/25 0342) Pulse Rate:  [56-64] 64 (01/25 0342) Resp:  [17-19] 19 (01/25 0342) BP: (113-135)/(82-87) 135/87 (01/25 0342) SpO2:  [91 %-97 %] 91 % (01/25 0342) Weight:  [70.6 kg] 70.6 kg (01/25 0342) Weight change:  Last BM Date: 12/17/18  Intake/Output from previous day: 01/24 0701 - 01/25 0700 In: 540 [P.O.:540] Out: 325 [Urine:325]  Physical Exam General: Sitting in chair beside bed.  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 Lungs: Clear to auscultation without increased work of breathing. Cardiovascular: Irregular rate and rhythm.  No lower extremity edema. Neurologic: Left hemiparesis upper extremity most affected trace expressive aphasia noted.  Lab Results: BMET    Component Value Date/Time   NA 136 12/16/2018 0714   NA 138 11/09/2018 1131   K 4.2 12/16/2018 0714   CL 104 12/16/2018 0714   CO2 25 12/16/2018 0714   GLUCOSE 87 12/16/2018 0714   GLUCOSE 90 09/28/2006 0957   BUN 12 12/16/2018 0714   BUN 15 11/09/2018 1131   CREATININE 1.08 12/16/2018 0714   CALCIUM 9.1 12/16/2018 0714   GFRNONAA >60 12/16/2018 0714   GFRAA >60 12/16/2018 0714   CBC    Component Value Date/Time   WBC 3.8 (L) 12/16/2018 0714   RBC 4.81 12/16/2018 0714   HGB 14.4 12/16/2018 0714   HGB 14.3 11/01/2018 1046   HCT 44.0 12/16/2018 0714   HCT 42.2 11/01/2018 1046   PLT 224 12/16/2018 0714   PLT 104 (L) 11/01/2018 1046   MCV 91.5 12/16/2018 0714   MCV 95 11/01/2018 1046   MCH 29.9 12/16/2018 0714   MCHC 32.7 12/16/2018 0714   RDW 12.7 12/16/2018 0714   RDW 12.6 11/01/2018 1046   LYMPHSABS 0.8 12/02/2018 0725   MONOABS 0.6 12/02/2018 0725   EOSABS 0.2 12/02/2018 0725   BASOSABS 0.0  12/02/2018 0725   CBG's (last 3):  No results for input(s): GLUCAP in the last 72 hours. LFT's Lab Results  Component Value Date   ALT 49 (H) 12/02/2018   AST 50 (H) 12/02/2018   ALKPHOS 36 (L) 12/02/2018   BILITOT 2.4 (H) 12/02/2018    Studies/Results: No results found.  Medications:  I have reviewed the patient's current medications. Scheduled Medications: . Adalimumab  40 mg Subcutaneous Q14 Days  . atenolol  75 mg Oral Daily  . potassium chloride SA  20 mEq Oral BID  . senna-docusate  1 tablet Oral BID  . simvastatin  20 mg Oral QHS   PRN Medications: acetaminophen **OR** acetaminophen (TYLENOL) oral liquid 160 mg/5 mL **OR** acetaminophen, hydrocortisone, ondansetron, sorbitol, traZODone  Assessment/Plan: Principal Problem:   ICH (intracerebral hemorrhage) (HCC) Active Problems:   Hyperlipidemia, mild   Essential hypertension   Rheumatoid arthritis (HCC)   Atrial fibrillation (HCC)  1.  Status post intracranial hemorrhage of right basal ganglia with subsequent dysarthria and left hemiparesis.  Related to amyloid angiopathy while on anticoagulation.  Per neuro, now off Eliquis due to risk of recurrent bleeding.  Continue intensive inpatient rehab therapy to include PT, OT and ST as ongoing.  Continue medical management of other chronic comorbidities. 2.  Atrial fibrillation on Eliquis prior to admission.  As above, now off Eliquis and continuing rate  control therapy only.  For outpatient follow-up with cardiology and neurology to determine neck steps for intervention.  Note patient was scheduled for cardioversion prior to intracranial hemorrhage event. 3 hypertension.  Continue medications and titrate as needed 4.  Dyslipidemia.  Continue statin 5.  Rheumatoid arthritis.  On chronic prednisone plus Humira injections prior to admission.  Length of stay, days: 16  Koraline Phillipson A. Asa Lente, MD 12/17/2018, 12:52 PM

## 2018-12-17 NOTE — Progress Notes (Signed)
Occupational Therapy Session Note  Patient Details  Name: Barry Woods MRN: [id number] Date of Birth: November 03, 1946  Today's Date: 12/17/2018 OT Individual Time: 1123-1207 OT Individual Time Calculation (min): 44 min   Short Term Goals: Week 2:  OT Short Term Goal 1 (Week 2): STGs=LTGs secondary to upcoming discharge  Skilled Therapeutic Interventions/Progress Updates:    Pt greeted in w/c with no c/o pain. Requesting to shower after using the restroom. Pt completed toileting (sit<stand from low toilet with continent void of bladder), bathing (at shower level), and dressing (w/c level at sink) during session. Tx focus on Lt NMR, Lt attention, and dynamic balance during self care tasks. All functional transfers completed with Mod A via stand pivot and vcs for L UE placement. Pt able to lower pants prior to toileting with Mod A for balance. Similar balance assist required while he stood to complete perihygiene in shower. He leaned to Lt to complete most of perihygiene while seated. Vcs required for clothing orientation and carryover of hemi techniques during dressing after. Able to utilize figure 4 bilaterally for donning pants and footwear however required assist for gripper socks using one-handed methods. He stood to brush hair at sink with Mod A for balance. Afterwards pt was left in w/c with all needs within reach and safety belt fastened.   Therapy Documentation Precautions:  Precautions Precautions: None Precaution Comments: left inattention Restrictions Weight Bearing Restrictions: No Vital Signs: Therapy Vitals Temp: 98.1 F (36.7 C) Temp Source: Oral Pulse Rate: 71 Resp: 14 BP: 123/71 Patient Position (if appropriate): Lying Oxygen Therapy SpO2: 98 % O2 Device: Room Air Pain: No c/o pain during tx    ADL: ADL Eating: Moderate cueing, Contact guard Where Assessed-Eating: Edge of bed Upper Body Dressing: Minimal assistance Where Assessed-Upper Body Dressing: Edge of  bed Lower Body Dressing: Maximal assistance Where Assessed-Lower Body Dressing: Edge of bed Toilet Transfer: Moderate assistance Toilet Transfer Method: Stand pivot      Therapy/Group: Individual Therapy  Anjelita Sheahan A Clothilde Tippetts 12/17/2018, 3:47 PM

## 2018-12-18 ENCOUNTER — Inpatient Hospital Stay (HOSPITAL_COMMUNITY): Payer: Medicare HMO

## 2018-12-18 NOTE — Progress Notes (Signed)
Occupational Therapy Session Note  Patient Details  Name: Barry Woods MRN: [id number] Date of Birth: 06-Apr-1946  Today's Date: 12/18/2018 OT Individual Time: 7544-9201 OT Individual Time Calculation (min): 72 min    Short Term Goals: Week 2:  OT Short Term Goal 1 (Week 2): STGs=LTGs secondary to upcoming discharge  Skilled Therapeutic Interventions/Progress Updates:    Pt participated in therapeutic dance/music group but was individually attended to and assisted. Group focused on social participation/engagement, ADL transfers, and B UE/LE coordination/strengthening. Pt requested several songs for the groups and engaged with other patients nearby. Instruction provided throughout group re ideas for challenging dynamic balance sitting and standing and B UE coordination. Pt stood and completed functional stepping motion with min A with no UE support. Pt returned to room and completed quick shower at end of session. CGA provided during functional mobility into bathroom and transfer onto TTB. Cueing required for safety awareness to doff LB clothing while seated. Pt was passed off to NT [place].   Therapy Documentation Precautions:  Precautions Precautions: None Precaution Comments: left inattention Restrictions Weight Bearing Restrictions: No Vital Signs: Therapy Vitals Temp: 97.9 F (36.6 C) Pulse Rate: (!) 57 Resp: 16 BP: 123/85 Patient Position (if appropriate): Lying Oxygen Therapy SpO2: 97 % O2 Device: Room Air Pain: Pain Assessment Pain Scale: 0-10 Pain Score: 0-No pain   Therapy/Group: Individual Therapy  Curtis Sites 12/18/2018, 7:12 AM

## 2018-12-18 NOTE — Progress Notes (Signed)
Barry Woods is a 73 y.o. male 1946/11/14 [id number]  Subjective: Patient reports feeling well today.  Left hand strength is improving.  No new complaints or concerns.  Objective: Vital signs in last 24 hours: Temp:  [97.9 F (36.6 C)-98.2 F (36.8 C)] 97.9 F (36.6 C) (01/26 0512) Pulse Rate:  [57-72] 57 (01/26 0512) Resp:  [14-16] 16 (01/26 0512) BP: (108-123)/(71-85) 123/85 (01/26 0512) SpO2:  [97 %-98 %] 97 % (01/26 0512) Weight:  [70.5 kg] 70.5 kg (01/26 0634) Weight change: -0.1 kg Last BM Date: 12/17/18  Intake/Output from previous day: 01/25 0701 - 01/26 0700 In: 919 [P.O.:919] Out: 375 [Urine:375]  Physical Exam General: Sitting in chair beside bed.  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 Lungs: Clear to auscultation without increased work of breathing. Cardiovascular: Irregular rate and rhythm.  No lower extremity edema. Neurologic: Left hemiparesis upper extremity most affected trace expressive aphasia noted.  Lab Results: BMET    Component Value Date/Time   NA 136 12/16/2018 0714   NA 138 11/09/2018 1131   K 4.2 12/16/2018 0714   CL 104 12/16/2018 0714   CO2 25 12/16/2018 0714   GLUCOSE 87 12/16/2018 0714   GLUCOSE 90 09/28/2006 0957   BUN 12 12/16/2018 0714   BUN 15 11/09/2018 1131   CREATININE 1.08 12/16/2018 0714   CALCIUM 9.1 12/16/2018 0714   GFRNONAA >60 12/16/2018 0714   GFRAA >60 12/16/2018 0714   CBC    Component Value Date/Time   WBC 3.8 (L) 12/16/2018 0714   RBC 4.81 12/16/2018 0714   HGB 14.4 12/16/2018 0714   HGB 14.3 11/01/2018 1046   HCT 44.0 12/16/2018 0714   HCT 42.2 11/01/2018 1046   PLT 224 12/16/2018 0714   PLT 104 (L) 11/01/2018 1046   MCV 91.5 12/16/2018 0714   MCV 95 11/01/2018 1046   MCH 29.9 12/16/2018 0714   MCHC 32.7 12/16/2018 0714   RDW 12.7 12/16/2018 0714   RDW 12.6 11/01/2018 1046   LYMPHSABS 0.8 12/02/2018 0725   MONOABS 0.6 12/02/2018 0725   EOSABS 0.2 12/02/2018 0725   BASOSABS 0.0 12/02/2018 0725   CBG's (last 3):  No results for input(s): GLUCAP in the last 72 hours. LFT's Lab Results  Component Value Date   ALT 49 (H) 12/02/2018   AST 50 (H) 12/02/2018   ALKPHOS 36 (L) 12/02/2018   BILITOT 2.4 (H) 12/02/2018    Studies/Results: No results found.  Medications:  I have reviewed the patient's current medications. Scheduled Medications: . Adalimumab  40 mg Subcutaneous Q14 Days  . atenolol  75 mg Oral Daily  . potassium chloride SA  20 mEq Oral BID  . senna-docusate  1 tablet Oral BID  . simvastatin  20 mg Oral QHS   PRN Medications: acetaminophen **OR** acetaminophen (TYLENOL) oral liquid 160 mg/5 mL **OR** acetaminophen, hydrocortisone, ondansetron, sorbitol, traZODone  Assessment/Plan: Principal Problem:   ICH (intracerebral hemorrhage) (HCC) Active Problems:   Hyperlipidemia, mild   Essential hypertension   Rheumatoid arthritis (HCC)   Atrial fibrillation (HCC)  1.  Status post intracranial hemorrhage of right basal ganglia with subsequent dysarthria and left hemiparesis.  Related to amyloid angiopathy while on anticoagulation.  Per neuro, now off Eliquis due to risk of recurrent bleeding.  Continue intensive inpatient rehab therapy to include PT, OT and ST as ongoing.  Continue medical management of other chronic comorbidities. 2.  Atrial fibrillation on Eliquis prior to admission.  As above, now off Eliquis and continuing rate  control therapy only.  For outpatient follow-up with cardiology and neurology to determine neck steps for intervention.  Note patient was scheduled for cardioversion prior to intracranial hemorrhage event. 3 hypertension.  Continue medications and titrate as needed 4.  Dyslipidemia.  Continue statin 5.  Rheumatoid arthritis.  On chronic prednisone plus Humira injections prior to admission.  Length of stay, days: 17  Brandyce Dimario A. Asa Lente, MD 12/18/2018, 10:51 AM

## 2018-12-19 ENCOUNTER — Inpatient Hospital Stay (HOSPITAL_COMMUNITY): Payer: Medicare HMO

## 2018-12-19 ENCOUNTER — Inpatient Hospital Stay (HOSPITAL_COMMUNITY): Payer: Medicare HMO | Admitting: Occupational Therapy

## 2018-12-19 ENCOUNTER — Inpatient Hospital Stay (HOSPITAL_COMMUNITY): Payer: Medicare HMO | Admitting: Speech Pathology

## 2018-12-19 ENCOUNTER — Inpatient Hospital Stay (HOSPITAL_COMMUNITY): Payer: Medicare HMO | Admitting: Physical Therapy

## 2018-12-19 NOTE — Progress Notes (Signed)
Speech Language Pathology Daily Session Note  Patient Details  Name: Barry Woods MRN: [id number] Date of Birth: 1945-12-10  Today's Date: 12/19/2018 SLP Individual Time: 1300-1400 SLP Individual Time Calculation (min): 60 min  Short Term Goals: Week 3: SLP Short Term Goal 1 (Week 3): Pt will consume regular textures with supervision cues for use of swallowing precautions.   SLP Short Term Goal 2 (Week 3): Pt will consume thin liquids with supervision cues for use of swallow strategies.  SLP Short Term Goal 3 (Week 3): Pt will complete mildly complex daily tasks with min assist verbal cues for functional problem solving.   SLP Short Term Goal 4 (Week 3): Pt will recall mildly complex, daily information with min assist verbal cues for use of external aids.   SLP Short Term Goal 5 (Week 3): Pt will recognize and correct errors in the moment during functional tasks with min assist verbal cues.   SLP Short Term Goal 6 (Week 3): Pt will selectively attend to tasks in a mildly distracting environment for 15 minute intervals with min verbal cues for redirection to task.    Skilled Therapeutic Interventions:    Skilled treatment session focused on cognition goals. SLP facilitated session by providing moderately distracting environment with another pt in the dayroom. Pt required Min A cues for appropriate turn taking and supervision cues for left attention. Pt with no self-awareness. Pt able to recall semi-complex information with Min A cues. Pt enjoyed interacting with another pt and demonstrated appropriate empathy. Pt was returned to room, left upright in wheelchair with all needs within reach.    Pain Pain Assessment Pain Scale: 0-10 Pain Score: 0-No pain  Therapy/Group: Individual Therapy  Candida Vetter 12/19/2018, 3:02 PM

## 2018-12-19 NOTE — Plan of Care (Signed)
  Problem: Consults Goal: RH STROKE PATIENT EDUCATION Description See Patient Education module for education specifics  Outcome: Progressing   Problem: RH SKIN INTEGRITY Goal: RH STG MAINTAIN SKIN INTEGRITY WITH ASSISTANCE Description STG Maintain Skin Integrity With min Assistance.  Outcome: Progressing   Problem: RH PAIN MANAGEMENT Goal: RH STG PAIN MANAGED AT OR BELOW PT'S PAIN GOAL Description <3 on a 0-10 pain scale.  Outcome: Progressing   Problem: RH KNOWLEDGE DEFICIT Goal: RH STG INCREASE KNOWLEDGE OF STROKE PROPHYLAXIS Description Patient and family will demonstrate knowledge of ways to prevent future strokes with medications, dietary restrictions, and exercise management with min assist from rehab staff before discharge.  Outcome: Progressing

## 2018-12-19 NOTE — Progress Notes (Signed)
Physical Therapy Session Note  Patient Details  Name: Barry Woods MRN: [id number] Date of Birth: 1946/05/22  Today's Date: 12/19/2018 PT Individual Time: 1415-1500 PT Individual Time Calculation (min): 45 min   Short Term Goals: Week 2:  PT Short Term Goal 1 (Week 2): Pt will ambulate x 100' with min A and LRAD PT Short Term Goal 1 - Progress (Week 2): Met PT Short Term Goal 2 (Week 2): Pt will negotiate 4 stairs with 2 handrails and min A PT Short Term Goal 2 - Progress (Week 2): Met PT Short Term Goal 3 (Week 2): Pt will demonstrate awareness of LLE with gait and transfers with cueing required less than 25% of the time PT Short Term Goal 3 - Progress (Week 2): Met  Skilled Therapeutic Interventions/Progress Updates:    Pt received seated in w/c in room, agreeable to PT. No complaints of pain. Pt easily distracted throughout therapy session and needs cues to attend to therapy tasks. Sit to stand with CGA throughout session. Ascend/descend 4 stairs forwards with 2 handrails and CGA, step-to gait pattern and v/c to attend to LUE. Ascend/descend 4 stairs laterally with L handrail and CGA. Ambulation 2 x 100' with RW with L hand orthosis and min A for weight shift at hips and to attend to LLE with gait. Pt exhibits decreased balance and decreased safety with advancing LLE when distracted by environment. Manual w/c propulsion x 150 ft with use of R UE/LE and Supervision. Pt left seated in w/c in room with needs in reach, quick release belt in place. Pt also exhibits poor insight into deficits and reports he will be "fine" at home even if his family and/or caregiver do not attend family education this week. Education with patient that he will need to have somebody come in to perform hands-on training with therapy prior to d/c. Followed up with CSW who has contacted family and set up family education for later this week prior to d/c.  Therapy Documentation Precautions:  Precautions Precautions:  None Precaution Comments: left inattention Restrictions Weight Bearing Restrictions: No Pain: Pain Assessment Pain Scale: 0-10 Pain Score: 0-No pain    Therapy/Group: Individual Therapy  Excell Seltzer, PT, DPT  12/19/2018, 3:53 PM

## 2018-12-19 NOTE — Progress Notes (Signed)
[facility] PHYSICAL MEDICINE & REHABILITATION PROGRESS NOTE   Subjective/Complaints:   Up with threapy going to bathroom. No new issues.   ROS: Patient denies fever, rash, sore throat, blurred vision, nausea, vomiting, diarrhea, cough, shortness of breath or chest pain, joint or back pain, headache, or mood change.   .  Objective:   No results found. No results for input(s): WBC, HGB, HCT, PLT in the last 72 hours. No results for input(s): NA, K, CL, CO2, GLUCOSE, BUN, CREATININE, CALCIUM in the last 72 hours.  Intake/Output Summary (Last 24 hours) at 12/19/2018 0914 Last data filed at 12/19/2018 0339 Gross per 24 hour  Intake 240 ml  Output 200 ml  Net 40 ml     Physical Exam: Vital Signs Blood pressure 134/75, pulse (!) 54, temperature 97.6 F (36.4 C), temperature source Oral, resp. rate 18, height 5\' 7"  (1.702 m), weight 70.1 kg, SpO2 97 %.  Constitutional: No distress . Vital signs reviewed. HEENT: EOMI, oral membranes moist Neck: supple Cardiovascular: RRR without murmur. No JVD    Respiratory: CTA Bilaterally without wheezes or rales. Normal effort    GI: BS +, non-tender, non-distended  Neurologic:improed memory, insight and awareness Motor:  LUE: 2+/5 delt, 3+/5bi , tri. 3-/5 grip-improving initiation LLE:, 4-/5 HF, KE, 3+ to 4/5 ADF/PF-no changes Senses pain in all 4's Skin: Warm and dry.  Intact. Psych: pleasant  Assessment/Plan: 1. 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  Physiatrist is providing close team supervision and 24 hour management of active medical problems listed below.  Physiatrist and rehab team continue to assess barriers to discharge/monitor patient progress toward functional and medical goals  Care Tool:  Bathing  Bathing activity did not occur: (Not assessed) Body parts bathed by patient: Left arm, Chest,  Abdomen, Front perineal area, Right upper leg, Left upper leg, Face, Right lower leg, Left lower leg, Right arm, Buttocks   Body parts bathed by helper: Buttocks     Bathing assist Assist Level: Minimal Assistance - Patient > 75%(taking standing balance into consideration)     Upper Body Dressing/Undressing Upper body dressing   What is the patient wearing?: Pull over shirt    Upper body assist Assist Level: Supervision/Verbal cueing    Lower Body Dressing/Undressing Lower body dressing      What is the patient wearing?: Pants, Incontinence brief     Lower body assist Assist for lower body dressing: Moderate Assistance - Patient 50 - 74%     Toileting Toileting Toileting Activity did not occur (Clothing management and hygiene only): N/A (no void or bm)  Toileting assist Assist for toileting: Moderate Assistance - Patient 50 - 74%     Transfers Chair/bed transfer  Transfers assist     Chair/bed transfer assist level: Contact Guard/Touching assist     Locomotion Ambulation   Ambulation assist      Assist level: Contact Guard/Touching assist Assistive device: Walker-rolling Max distance: 125'   Walk 10 feet activity   Assist     Assist level: Contact Guard/Touching assist Assistive device: Walker-rolling, Orthosis   Walk 50 feet activity   Assist Walk 50 feet with 2 turns activity did not occur: Safety/medical concerns  Assist level: Contact Guard/Touching assist Assistive device: Walker-rolling, Orthosis    Walk 150 feet activity   Assist Walk 150 feet activity did not occur: Safety/medical concerns         Walk  10 feet on uneven surface  activity   Assist Walk 10 feet on uneven surfaces activity did not occur: Safety/medical concerns         Wheelchair     Assist Will patient use wheelchair at discharge?: No Type of Wheelchair: Manual    Wheelchair assist level: Supervision/Verbal cueing Max wheelchair distance: 100'     Wheelchair 50 feet with 2 turns activity    Assist        Assist Level: Supervision/Verbal cueing   Wheelchair 150 feet activity     Assist Wheelchair 150 feet activity did not occur: Safety/medical concerns   Assist Level: Minimal Assistance - Patient > 75%     Medical Problem List and Plan: 1.Left side weakness with dysarthriasecondary to right basal ganglia ICH due amyloid angiopathy as well as left subcortical CVA  -Continue CIR therapies including PT, OT, and SLP   -working toward 1/30 discharge with supervison at home  -family meeting last Friday   -pt is not to resume anticoagulation due to [place] and risk factor control/treatment recommended at this point with outpt neurology follow up upon discharge home 2. DVT Prophylaxis/Anticoagulation: SCDs. Monitor for any signs of DVT 3. Pain Management:Tylenol as needed 4. Mood:Provide emotional support 5. Neuropsych: This patientiscapable of making decisions on hisown behalf. 6. Skin/Wound Care:Routine skin checks 7. Fluids/Electrolytes/Nutrition:encourage PO  -itolerating regular diet 8. Atrial fibrillation. ELIQUIS discontinued due to [place]. Cardiac rate controlled. Continue Tenormin 75 mg daily.   -watch for excessive bradycardia  -improved control 1/27  - pt on 20-40mg  lasix at home at times for volume mgt  -lasix held due to hyponatremia    -lasix 20mg  given x 1 1/20 with improvement in weights---can use PRN at home [facility] Weights   12/17/18 0342 12/18/18 0634 12/19/18 0451  Weight: 70.6 kg 70.5 kg 70.1 kg   -continue potassium supplement back to 20 mEq twice daily  -no SOB at present 9. Hyperlipidemia. Zocor 10. Rheumatoid arthritis. Patient on chronic prednisone as well as scheduled injections of Humira prior to admission. Humira Q Friday    11.  Hypertension  Continue atenolol   Good control.  12.  Hyponatremia--resolved off lasix  -136 1/17--- labs pending today 13. ?mild gross  hematuria---may be from external source. UA was negative  -appears to have resolved   LOS: 18 days A FACE TO FACE EVALUATION WAS PERFORMED  Meredith Staggers 12/19/2018, 9:14 AM

## 2018-12-19 NOTE — Progress Notes (Signed)
Occupational Therapy Session Note  Patient Details  Name: Barry Woods MRN: [id number] Date of Birth: May 25, 1946  Today's Date: 12/19/2018 OT Individual Time: 0730-0830 OT Individual Time Calculation (min): 60 min    Short Term Goals: Week 2:  OT Short Term Goal 1 (Week 2): STGs=LTGs secondary to upcoming discharge  Skilled Therapeutic Interventions/Progress Updates:    Pt received supine in bed with no c/o pain. Pt transitioned to EOB and completed sit > stand with CGA. Pt completed functional mobility into bathroom using RW with L orthosis with CGA. Cueing for RW management over ledge. Pt completed toileting with CGA. Pt stood at sink and completed grooming tasks with (S). Set up provided for breakfast tray. Pt with tremor during self feeding. Discussed use of weighted utensils and trialed use of weighted fork. No significant difference observed so pt opted to not use. Discussed options for pt with self feeding if tremors progress. Pt requested to spend last few minutes of session on NuStep. Pt cued for increasing intensity and step rate. Pt returned to room and was left sitting up in w/c with chair alarm belt fastened and all needs met.   Therapy Documentation Precautions:  Precautions Precautions: None Precaution Comments: left inattention Restrictions Weight Bearing Restrictions: No   Vital Signs: Therapy Vitals Pulse Rate: (!) 54 BP: 134/75 Oxygen Therapy SpO2: 97 % Pain:  No pain reported    Therapy/Group: Individual Therapy  Curtis Sites 12/19/2018, 7:13 AM

## 2018-12-19 NOTE — Progress Notes (Signed)
Occupational Therapy Session Note  Patient Details  Name: Barry Woods MRN: [id number] Date of Birth: December 16, 1945  Today's Date: 12/19/2018 OT Individual Time: 4037-5436 OT Individual Time Calculation (min): 28 min    Short Term Goals: Week 2:  OT Short Term Goal 1 (Week 2): STGs=LTGs secondary to upcoming discharge    Skilled Therapeutic Interventions/Progress Updates:    Pt greeted in w/c. Started session with gentle shoulder rolls and scapular pinches as preparatory activity due to Lt shoulder soreness. Worked on [employer] and bilateral coordination with football throwing in [place]. Pt required assist to setup Lt hand to fully grasp football. Able to generate enough force to throw with Lt. Also able to integrate Lt when catching. He often dropped football prior to throw when he was distracted by conversation. Mod vcs for increasing visual attendance to Lt to meet activity demands. At end of session pt was returned to room and left with all needs within reach and safety belt fastened.   Therapy Documentation Precautions:  Precautions Precautions: None Precaution Comments: left inattention Restrictions Weight Bearing Restrictions: No Pain: Lt shoulder. He did not want OT to ask RN for medicinal interventions.   ADL: ADL Eating: Moderate cueing, Contact guard Where Assessed-Eating: Edge of bed Upper Body Dressing: Minimal assistance Where Assessed-Upper Body Dressing: Edge of bed Lower Body Dressing: Maximal assistance Where Assessed-Lower Body Dressing: Edge of bed Toilet Transfer: Moderate assistance Toilet Transfer Method: Stand pivot      Therapy/Group: Individual Therapy  Tanush Drees A Lavoris Sparling 12/19/2018, 12:47 PM

## 2018-12-20 ENCOUNTER — Inpatient Hospital Stay (HOSPITAL_COMMUNITY): Payer: Medicare HMO | Admitting: Physical Therapy

## 2018-12-20 ENCOUNTER — Inpatient Hospital Stay (HOSPITAL_COMMUNITY): Payer: Medicare HMO | Admitting: Occupational Therapy

## 2018-12-20 ENCOUNTER — Inpatient Hospital Stay (HOSPITAL_COMMUNITY): Payer: Medicare HMO

## 2018-12-20 ENCOUNTER — Inpatient Hospital Stay (HOSPITAL_COMMUNITY): Payer: Medicare HMO | Admitting: Speech Pathology

## 2018-12-20 MED ORDER — NAPHAZOLINE-GLYCERIN 0.012-0.2 % OP SOLN
1.0000 [drp] | Freq: Four times a day (QID) | OPHTHALMIC | Status: DC | PRN
  Filled 2018-12-20: qty 15

## 2018-12-20 NOTE — Progress Notes (Signed)
Occupational Therapy Session Note  Patient Details  Name: Barry Woods MRN: [id number] Date of Birth: July 13, 1946  Today's Date: 12/20/2018 OT Individual Time: 1100-1130 OT Individual Time Calculation (min): 30 min    Short Term Goals: Week 1:  OT Short Term Goal 1 (Week 1): Pt will don shirt with supervision and min vcs for Lt side attendance  OT Short Term Goal 1 - Progress (Week 1): Not met OT Short Term Goal 2 (Week 1): Pt will complete 1/3 steps of donning pants  OT Short Term Goal 2 - Progress (Week 1): Met OT Short Term Goal 3 (Week 1): Pt will complete 1 grooming task in standing to improve balance  OT Short Term Goal 3 - Progress (Week 1): Met Week 2:  OT Short Term Goal 1 (Week 2): STGs=LTGs secondary to upcoming discharge     Skilled Therapeutic Interventions/Progress Updates:    Pt seen this session to focus on L hand coordination and L side attention.  Pt received in w/c and taken to gym where he used RW to transfer to mat.  Pt is used to placing L hand in hand support on walker first then standing up. Worked on sit to stand and then placing hand in for a more natural movement pattern. Pt was able to do this well.  Attempted a short walk without hand splint on RW and due to his inattention his hand continues to rotate into supination and he does not maintain a safe grasp on RW.  Splint replaced on  RW.  Pt sat on mat and worked on L hand coordination focusing on pronation to supination with a cup stacking activity and then [facility] with picking up small blocks.  AROM for finger extension.  Pt then transferred back to w/c, taken back to room and pt transferred back to bed to rest before lunch all with close S to CGA.   Bed alarm set and all needs met.   Therapy Documentation Precautions:  Precautions Precautions: None Precaution Comments: left inattention Restrictions Weight Bearing Restrictions: No    Vital Signs: Therapy Vitals Pulse Rate: 72 Pain: Pain  Assessment Pain Scale: 0-10 Pain Score: 0-No pain    Therapy/Group: Individual Therapy  [place] 12/20/2018, 12:25 PM

## 2018-12-20 NOTE — Progress Notes (Signed)
[place] PHYSICAL MEDICINE & REHABILITATION PROGRESS NOTE   Subjective/Complaints:   No new complaints. Would like some eye gtts for eyes which are occasionally dry.   ROS: Patient denies fever, rash, sore throat, blurred vision, nausea, vomiting, diarrhea, cough, shortness of breath or chest pain, joint or back pain, headache, or mood change.  .  Objective:   No results found. No results for input(s): WBC, HGB, HCT, PLT in the last 72 hours. No results for input(s): NA, K, CL, CO2, GLUCOSE, BUN, CREATININE, CALCIUM in the last 72 hours.  Intake/Output Summary (Last 24 hours) at 12/20/2018 0953 Last data filed at 12/19/2018 2347 Gross per 24 hour  Intake 660 ml  Output 200 ml  Net 460 ml     Physical Exam: Vital Signs Blood pressure 125/78, pulse 72, temperature 97.9 F (36.6 C), temperature source Oral, resp. rate 16, height 5\' 7"  (1.702 m), weight 73.1 kg, SpO2 96 %.  Constitutional: No distress . Vital signs reviewed. HEENT: EOMI, oral membranes moist Neck: supple Cardiovascular: IRR in 70's 80's without murmur. No JVD    Respiratory: CTA Bilaterally without wheezes or rales. Normal effort    GI: BS +, non-tender, non-distended  Neurologic:improed memory, insight and awareness Motor:  LUE: 2+/5 delt, 3+/5bi , tri. 3/5 grip LLE:, 4-/5 HF, KE, 3+ to 4/5 ADF/PF-no changes Senses pain in all 4's Skin: Warm and dry.  Intact. Psych: pleasant  Assessment/Plan: 1. 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  Physiatrist is providing close team supervision and 24 hour management of active medical problems listed below.  Physiatrist and rehab team continue to assess barriers to discharge/monitor patient progress toward functional and medical goals  Care Tool:  Bathing  Bathing activity did not occur: (Not assessed) Body parts bathed by patient:  Left arm, Chest, Abdomen, Front perineal area, Right upper leg, Left upper leg, Face, Right lower leg, Left lower leg, Right arm, Buttocks   Body parts bathed by helper: Buttocks     Bathing assist Assist Level: Supervision/Verbal cueing     Upper Body Dressing/Undressing Upper body dressing   What is the patient wearing?: Pull over shirt    Upper body assist Assist Level: Supervision/Verbal cueing    Lower Body Dressing/Undressing Lower body dressing      What is the patient wearing?: Pants, Incontinence brief     Lower body assist Assist for lower body dressing: Contact Guard/Touching assist     Toileting Toileting Toileting Activity did not occur (Clothing management and hygiene only): N/A (no void or bm)  Toileting assist Assist for toileting: Contact Guard/Touching assist     Transfers Chair/bed transfer  Transfers assist     Chair/bed transfer assist level: Contact Guard/Touching assist     Locomotion Ambulation   Ambulation assist      Assist level: Minimal Assistance - Patient > 75% Assistive device: Walker-rolling Max distance: 100'   Walk 10 feet activity   Assist     Assist level: Minimal Assistance - Patient > 75% Assistive device: Walker-rolling   Walk 50 feet activity   Assist Walk 50 feet with 2 turns activity did not occur: Safety/medical concerns  Assist level: Minimal Assistance - Patient > 75% Assistive device: Walker-rolling    Walk 150 feet activity   Assist Walk 150 feet activity did not occur: Safety/medical concerns         Walk 10 feet on uneven  surface  activity   Assist Walk 10 feet on uneven surfaces activity did not occur: Safety/medical concerns         Wheelchair     Assist Will patient use wheelchair at discharge?: Yes Type of Wheelchair: Manual    Wheelchair assist level: Supervision/Verbal cueing Max wheelchair distance: 150'    Wheelchair 50 feet with 2 turns  activity    Assist        Assist Level: Supervision/Verbal cueing   Wheelchair 150 feet activity     Assist Wheelchair 150 feet activity did not occur: Safety/medical concerns   Assist Level: Supervision/Verbal cueing     Medical Problem List and Plan: 1.Left side weakness with dysarthriasecondary to 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 -Interdisciplinary Team Conference today    -working toward 1/30 discharge with [place] at home  -family meeting last Friday   -pt is not to resume anticoagulation due to [place] and risk factor control/treatment recommended at this point with outpt neurology follow up upon discharge home 2. DVT Prophylaxis/Anticoagulation: SCDs. Monitor for any signs of DVT 3. Pain Management:Tylenol as needed 4. Mood:Provide emotional support 5. Neuropsych: This patientiscapable of making decisions on hisown behalf. 6. Skin/Wound Care:Routine skin checks 7. Fluids/Electrolytes/Nutrition:encourage PO  -itolerating regular diet 8. Atrial fibrillation. ELIQUIS discontinued due to [place]. Cardiac rate controlled. Continue Tenormin 75 mg daily.   -watch for excessive bradycardia  -improved control 1/28  - pt on 20-40mg  lasix at home at times for volume mgt  -lasix held due to hyponatremia    -lasix 20mg  given x 1 1/20 with improvement in weights---can use PRN at home [facility] Weights   12/18/18 0634 12/19/18 0451 12/20/18 0403  Weight: 70.5 kg 70.1 kg 73.1 kg   -continue potassium supplement back to 20 mEq twice daily  -no SOB at present  -weight sl up today---no changes to regimen today. Lasix tomorrow if weight up again tomorrow. 9. Hyperlipidemia. Zocor 10. Rheumatoid arthritis. Patient on chronic prednisone as well as scheduled injections of Humira prior to admission. Humira Q Friday    11.  Hypertension  Continue atenolol   Good control.  12.  Hyponatremia--resolved off  lasix  -136 1/24 13. ?mild gross hematuria---may be from external source. UA was negative  -appears to have resolved   LOS: 19 days A FACE TO [place] 12/20/2018, 9:53 AM

## 2018-12-20 NOTE — Plan of Care (Signed)
  Problem: Consults Goal: RH STROKE PATIENT EDUCATION Description See Patient Education module for education specifics  Outcome: Progressing   Problem: RH SKIN INTEGRITY Goal: RH STG MAINTAIN SKIN INTEGRITY WITH ASSISTANCE Description STG Maintain Skin Integrity With min Assistance.  Outcome: Progressing   Problem: RH KNOWLEDGE DEFICIT Goal: RH STG INCREASE KNOWLEDGE OF HYPERTENSION Description Patient and family will demonstrate knowledge on HTN medications and dietary restrictions with min assist from rehab staff before discharge.  Outcome: Progressing Goal: RH STG INCREASE KNOWLEDGE OF STROKE PROPHYLAXIS Description Patient and family will demonstrate knowledge of ways to prevent future strokes with medications, dietary restrictions, and exercise management with min assist from rehab staff before discharge.  Outcome: Progressing

## 2018-12-20 NOTE — Progress Notes (Addendum)
Speech Language Pathology Daily Session Note  Patient Details  Name: Barry Woods MRN: [id number] Date of Birth: 02/04/1946  Today's Date: 12/20/2018   Skilled treatment session #1 SLP Individual Time: 0800-0900 SLP Individual Time Calculation (min): 60 min   Skilled treatment session #2 SLP Individual Time: 6606-3016 SLP Individual Time Calculation (min): 10 min  Short Term Goals: Week 3: SLP Short Term Goal 1 (Week 3): Pt will consume regular textures with supervision cues for use of swallowing precautions.   SLP Short Term Goal 2 (Week 3): Pt will consume thin liquids with supervision cues for use of swallow strategies.  SLP Short Term Goal 3 (Week 3): Pt will complete mildly complex daily tasks with min assist verbal cues for functional problem solving.   SLP Short Term Goal 4 (Week 3): Pt will recall mildly complex, daily information with min assist verbal cues for use of external aids.   SLP Short Term Goal 5 (Week 3): Pt will recognize and correct errors in the moment during functional tasks with min assist verbal cues.   SLP Short Term Goal 6 (Week 3): Pt will selectively attend to tasks in a mildly distracting environment for 15 minute intervals with min verbal cues for redirection to task.    Skilled Therapeutic Interventions:  Skilled treatment session focused on dysphagia and cognition goals. SLP facilitated session by providing Max A cues for left head turn during consumption of breakfast tray. Pt with subtle throat clears when head turn not implemented. Pt has difficulty implementing d/t deficits in recall and attention. Despite education, pt with frequent attempts to talk while food was in mouth.  SLP further facilitated session by providing Mod A cues to form a grocery list via ad with specific requests for money and items. Despite writing down the items, pt with deficits in recall of items, selective attention, visual scanning, task organization and problem solving. Pt  with no self-awareness and multiple excuses when errors were identified by SLP. Pt frequently interrupts attempts to provide education and requires Max A cues to listen to SLP.   Of note, pt states that his family will not be present for previously scheduled education on Wednesday 12/21/18. He states that his family "has investigated request to attend caregiver education and they have found that it (caregiver education) isn't required" rather "the facility requests it to maintain their certifications." As a result of this, pt's family has not been education by ST on pt's increased risk of aspiration, compensatory swallow strategies and cognitive deficits that make pt unsafe within home environment. CSW and MD are aware.   Skilled treatment session #2 focused on education with pt regarding need for daughters/caregivers to be aware of pt's swallow dysfunction and high risk of aspiration. Pt's daughter not present at this time and doesn't plan of being present for scheduled education. Given pt's memory deficits, unsure if pt has clearly conveyed information. Will reach out to daughter to provide education on aspiration risk.   This Probation officer contacted pt's daughter Barry Woods) and provided education on pt's risk of aspiration including results of MBS that revealed silent aspiration and need for pt to turn head to left when swallowing food and liquids. Education provided on overt s/s of aspiration and s/s of aspiration pneumonia. Daughter voiced understanding and all questions were answered to her satisfaction. SLP available should any further questions arise.   Pain Pain Assessment Pain Scale: 0-10 Pain Score: 0-No pain  Therapy/Group: Individual Therapy  Barry Woods 12/20/2018, 9:55 AM

## 2018-12-20 NOTE — Progress Notes (Signed)
Occupational Therapy Session Note  Patient Details  Name: Barry Woods MRN: [id number] Date of Birth: [birth year]  Today's Date: 12/20/2018 OT Individual Time: 9323-5573 OT Individual Time Calculation (min): 60 min    Short Term Goals: Week 2:  OT Short Term Goal 1 (Week 2): STGs=LTGs secondary to upcoming discharge  Skilled Therapeutic Interventions/Progress Updates:    Pt received sitting up in w/c with no c/o pain, ready to take shower. Pt completed functional mobility into bathroom and transferred onto TTB with (S). Pt completed UB/LB bathing with use of anterior grab bars during standing with close (S). Pt completed 2x SPT to w/c to transfer to toilet. Pt voided urine and transferred back to w/c with (S). Pt completed UB dressing with set up. Min A required to don shorts, tremors prohibit pt from fastening button. Pt attempted to don socks but was unable to coordinate reaching with enough accuracy and steadiness to reach toes. A sock aid was acquired and pt given demo re use. Pt donned socks with min A overall. Cueing provided for use of L UE throughout dressing. Pt completed oral care with set up. Pt left sitting up in w/c with all needs met, chair alarm belt fastened.   Therapy Documentation Precautions:  Precautions Precautions: None Precaution Comments: left inattention Restrictions Weight Bearing Restrictions: No Vital Signs: Therapy Vitals Temp: 97.9 F (36.6 C) Temp Source: Oral Pulse Rate: 69 Resp: 16 BP: 125/78 Patient Position (if appropriate): Sitting Oxygen Therapy SpO2: 96 % O2 Device: Room Air Pain:  No c/o during session.    Therapy/Group: Individual Therapy  Curtis Sites 12/20/2018, 7:16 AM

## 2018-12-20 NOTE — Progress Notes (Signed)
Checked with pt regarding family teaching, pt sister here, pt states his daughter Tivis Ringer had been taught by watching his PT session and recording it and also that she is aware he needs the left turn of his head when he swallows and states that Happi had spoken with daughter as well with regards to his eating and swallowing.  Pt verb understanding of his care after discharge.

## 2018-12-20 NOTE — Progress Notes (Signed)
Physical Therapy Session Note  Patient Details  Name: Barry Woods MRN: [id number] Date of Birth: 01/20/1946  Today's Date: 12/20/2018 PT Individual Time: 1300-1400 PT Individual Time Calculation (min): 60 min   Short Term Goals: Week 3:  PT Short Term Goal 1 (Week 3): =LTG due to ELOS  Skilled Therapeutic Interventions/Progress Updates:    Pt received seated in recliner in room, agreeable to PT. Pt's daughter [name] present during therapy session for family training. Pt requests to use the bathroom. Sit to stand with Supervision to CGA to RW, v/c for safety with transfer. Ambulation to bathroom with RW and CGA to min A for balance, v/c for LLE management. Toilet transfer with min A, mod A for clothing management. Demonstration of forward and lateral ascent/descent on stairs with 1-2 handrails and step-to gait pattern. Pt is able to verbalize steps for safely completing stairs but continues to exhibit some impulsivity and requires CGA for safety. Pt's daughter is able to take video of patient performing stairs to show to patient's hired full time 24/7 caregiver. Ambulation x 100 ft with RW and L hand orthosis, CGA for weight shift at hips and v/c for LLE clearance especially when distracted. Sidestepping L/R with RW and CGA to simulate gait through pt's narrow bathroom. Discussed safety with regards to showering and informed pt and family that OT does not recommend showering at this time due to safety concerns with shower transfers as well as energy expenditure required to ascend stairs, shower, then descend stairs. Demonstration of shower transfer with use of tub bench and discussed proper setup of tub bench as pt's family has purchased this and plan to attempt showering with home health therapies. Car transfer with CGA, v/c for safe transfer technique. Demonstration of ascending/descending curb with RW. Pt's daughter demos good understanding of pt assist level required at d/c and has necessary  video and notes from this session. Pt left seated in w/c in room with needs in reach, quick release belt and chair alarm in place.  Therapy Documentation Precautions:  Precautions Precautions: None Precaution Comments: left inattention Restrictions Weight Bearing Restrictions: No Pain: Pain Assessment Pain Score: 0-No pain    Therapy/Group: Individual Therapy  Excell Seltzer, PT, DPT  12/20/2018, 3:55 PM

## 2018-12-21 ENCOUNTER — Inpatient Hospital Stay (HOSPITAL_COMMUNITY): Payer: Medicare HMO | Admitting: Occupational Therapy

## 2018-12-21 ENCOUNTER — Inpatient Hospital Stay (HOSPITAL_COMMUNITY): Payer: Medicare HMO | Admitting: Physical Therapy

## 2018-12-21 ENCOUNTER — Inpatient Hospital Stay (HOSPITAL_COMMUNITY): Payer: Medicare HMO | Admitting: Speech Pathology

## 2018-12-21 MED ORDER — FUROSEMIDE 20 MG PO TABS
20.0000 mg | ORAL_TABLET | Freq: Once | ORAL | Status: AC
  Administered 2018-12-21: 20 mg via ORAL
  Filled 2018-12-21: qty 1

## 2018-12-21 NOTE — Patient Care Conference (Signed)
Inpatient RehabilitationTeam Conference and Plan of Care Update Date: 12/20/2018   Time: 2:00 PM    Patient Name: Barry Woods      Medical Record Number: [id number]  Date of Birth: 1946-04-13 Sex: Male         Room/Bed: 4W17C/4W17C-01 Payor Info: Payor: [employer] MEDICARE / Plan: [employer] MEDICARE HMO/PPO / Product Type: *No Product type* /    Admitting Diagnosis: RT Basal ganglia hem  Admit Date/Time:  12/01/2018  5:17 PM Admission Comments: No comment available   Primary Diagnosis:  ICH (intracerebral hemorrhage) (HCC) Principal Problem: ICH (intracerebral hemorrhage) ([place])  Patient Active Problem List   Diagnosis Date Noted  . Hemorrhagic stroke ([place])   . Hyponatremia   . Dyslipidemia   . AKI (acute kidney injury) ([place])   . Cerebral cavernoma   . ICH (intracerebral hemorrhage) ([place]) 11/27/2018  . Diastolic CHF, chronic ([place]) 11/09/2018  . Atrial fibrillation ([place]) 11/07/2018  . Coronary artery calcification seen on CT scan 10/31/2018  . Routine general medical examination at a health care facility 03/09/2018  . Benign essential tremor 02/16/2017  . Pure hypercholesterolemia 02/16/2017  . ILD (interstitial lung disease) ([place]) 10/21/2016  . BPH associated with nocturia 09/24/2015  . Rheumatoid arthritis ([place]) 09/28/2011  . Gout attack 02/02/2011  . 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 Hyperlipidemia, mild 11/01/2007  . Essential hypertension 11/01/2007    Expected Discharge Date: Expected Discharge Date: 12/22/18  Team Members Present: Physician leading conference: Dr. Alger Simons Social Worker Present: Lennart Pall, LCSW Nurse Present: Dwaine Gale, RN PT Present: Other (comment)(Taylor Ervin Knack, PT) OT Present: Darleen Crocker, OT SLP Present: Stormy Fabian, SLP PPS Coordinator present : Ileana Ladd, PT     Current Status/Progress Goal Weekly Team Focus  Medical   improving balance and safety awareness, still needs supervision at home given visual-spatial deficits.  bp/cv improved. emptying bowels and bladder  optimize CV, volume status  see medical problem list, finalize medical plan for discharge   Bowel/Bladder   Continent of bowel and bladder LBM1/26/20  Remain continent with min assist  Monitor and assist with toileting as needed   Swallow/Nutrition/ Hydration   completion of MBS, silent aspiration need to turn head to left  Mod A - downgraded 1/28  use of compensatory swallow strategies   ADL's   Supervision bathing at shower level, supervision UB dressing, Min A LB dressing, Min A stand pivot bathroom transfers      Lt NMR, self care retraining, balance, functional transfers, pt/family education, d/c planning    Mobility   CGA transfers with RW, CGA to min A gait up to 166' with RW with L hand orthosis, CGA stairs with 1-2 rails  Supervision overall  family education   Communication   Mod I  Mod I - goal met  carry over of speech intelligibility strategies in conversation   Safety/Cognition/ Behavioral Observations  Mod A for semi-complex, error awareness, selective attention  Mod A for semi-complex problem solving, Max A error awareness - downgraded 1/28  semi-complex problem solving, error awareness, selective attention   Pain   Denies pain  Pain scale <2/10  Assess pain every shift and as needed   Skin   Skin tear to left elbow, MASD to buttocks and perineum  no new areas of skin breakdown  Assess skin every shift and prn     Rehab Goals Patient on target to meet rehab goals: Yes *See Care Plan and progress notes for long and short-term goals.  Barriers to Discharge  Current Status/Progress Possible Resolutions Date Resolved   Physician    Medical stability               Nursing                  PT                    OT                  SLP                SW                Discharge Planning/Teaching Needs:  Pt to d/c to his own townhome with daughters, sister and private duty care providing 24/7 assistance.  Family  also making home modifications.  Daughter to attend PT ed session this afternoon. Family feels this is adequate and decline any further session.   Team Discussion:  No medical issues.  Therapists report impulsivity continues to be an issue and expect may need to downgrade some goals to min-guard.  Daughter completed family ed today with PT.  ST concerned about their awareness/ understanding of swallow precautions and will plan to follow up with daughter as well.  Ready for d/c Thursday.  Revisions to Treatment Plan:  NA    Continued Need for Acute Rehabilitation Level of Care: The patient requires daily medical management by a physician with specialized training in physical medicine and rehabilitation for the following conditions: Daily direction of a multidisciplinary physical rehabilitation program to ensure safe treatment while eliciting the highest outcome that is of practical value to the patient.: Yes Daily medical management of patient stability for increased activity during participation in an intensive rehabilitation regime.: Yes Daily analysis of laboratory values and/or radiology reports with any subsequent need for medication adjustment of medical intervention for : Cardiac problems;Neurological problems   I attest that I was present, lead the team conference, and concur with the assessment and plan of the team.   Sanuel Ladnier 12/21/2018, 11:09 AM

## 2018-12-21 NOTE — Progress Notes (Signed)
[place] PHYSICAL MEDICINE & REHABILITATION PROGRESS NOTE   Subjective/Complaints:   Pt up in chair. No new problems. Discussed d/c planning with me. Anxious about going home  ROS: Patient denies fever, rash, sore throat, blurred vision, nausea, vomiting, diarrhea, cough, shortness of breath or chest pain, joint or back pain, headache, or mood change.   .  Objective:   No results found. No results for input(s): WBC, HGB, HCT, PLT in the last 72 hours. No results for input(s): NA, K, CL, CO2, GLUCOSE, BUN, CREATININE, CALCIUM in the last 72 hours.  Intake/Output Summary (Last 24 hours) at 12/21/2018 1051 Last data filed at 12/21/2018 0857 Gross per 24 hour  Intake 240 ml  Output 200 ml  Net 40 ml     Physical Exam: Vital Signs Blood pressure 135/78, pulse 80, temperature 98.3 F (36.8 C), temperature source Oral, resp. rate 17, height 5\' 7"  (1.702 m), weight 73.1 kg, SpO2 100 %.  Constitutional: No distress . Vital signs reviewed. HEENT: EOMI, oral membranes moist Neck: supple Cardiovascular: RRR without murmur. No JVD    Respiratory: CTA Bilaterally without wheezes or rales. Normal effort    GI: BS +, non-tender, non-distended  Neurologic:improed memory, insight and awareness Motor:  LUE: 2+/5 delt, 3+/5bi , tri. 3/5 grip LLE:, 4-/5 HF, KE, 3+ to 4/5 ADF/PF-no changes Senses pain in all 4's Skin: intact. Psych: pleasant  Assessment/Plan: 1. 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  Physiatrist is providing close team supervision and 24 hour management of active medical problems listed below.  Physiatrist and rehab team continue to assess barriers to discharge/monitor patient progress toward functional and medical goals  Care Tool:  Bathing  Bathing activity did not occur: (Not assessed) Body parts bathed by patient: Left arm, Chest,  Abdomen, Front perineal area, Right upper leg, Left upper leg, Face, Right lower leg, Left lower leg, Right arm, Buttocks   Body parts bathed by helper: Buttocks     Bathing assist Assist Level: Supervision/Verbal cueing     Upper Body Dressing/Undressing Upper body dressing   What is the patient wearing?: Pull over shirt    Upper body assist Assist Level: Supervision/Verbal cueing    Lower Body Dressing/Undressing Lower body dressing      What is the patient wearing?: Pants, Incontinence brief     Lower body assist Assist for lower body dressing: Minimal Assistance - Patient > 75%     Toileting Toileting Toileting Activity did not occur (Clothing management and hygiene only): N/A (no void or bm)  Toileting assist Assist for toileting: Contact Guard/Touching assist     Transfers Chair/bed transfer  Transfers assist     Chair/bed transfer assist level: Contact Guard/Touching assist     Locomotion Ambulation   Ambulation assist      Assist level: Contact Guard/Touching assist Assistive device: Walker-rolling Max distance: 100'   Walk 10 feet activity   Assist     Assist level: Contact Guard/Touching assist Assistive device: Walker-rolling   Walk 50 feet activity   Assist Walk 50 feet with 2 turns activity did not occur: Safety/medical concerns  Assist level: Contact Guard/Touching assist Assistive device: Walker-rolling    Walk 150 feet activity   Assist Walk 150 feet activity did not occur: Safety/medical concerns         Walk 10 feet on uneven surface  activity   Assist Walk 10 feet on uneven  surfaces activity did not occur: Safety/medical concerns         Wheelchair     Assist Will patient use wheelchair at discharge?: Yes Type of Wheelchair: Manual    Wheelchair assist level: Supervision/Verbal cueing Max wheelchair distance: 150'    Wheelchair 50 feet with 2 turns activity    Assist        Assist Level:  Supervision/Verbal cueing   Wheelchair 150 feet activity     Assist Wheelchair 150 feet activity did not occur: Safety/medical concerns   Assist Level: Supervision/Verbal cueing     Medical Problem List and Plan: 1.Left side weakness with dysarthriasecondary to 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 DVT Prophylaxis/Anticoagulation: SCDs. Monitor for any signs of DVT 3. Pain Management:Tylenol as needed 4. Mood:Provide emotional support 5. Neuropsych: This patientiscapable of making decisions on hisown behalf. 6. Skin/Wound Care:Routine skin checks 7. Fluids/Electrolytes/Nutrition:encourage PO  -itolerating regular diet 8. Atrial fibrillation. ELIQUIS discontinued due to [place]. Cardiac rate controlled. Continue Tenormin 75 mg daily.   -watch for excessive bradycardia  -improved control 1/28  - pt on 20-40mg  lasix at home at times for volume mgt  -lasix held due to hyponatremia    -lasix 20mg  given x 1 1/20 with improvement, weight up a few kg over last couple days, repeat lasix 20mg  po x1 Filed Weights   12/19/18 0451 12/20/18 0403 12/21/18 0500  Weight: 70.1 kg 73.1 kg 73.1 kg   -continue potassium supplement back to 20 mEq twice daily  -no SOB at present  -check labs tomorrow 9. Hyperlipidemia. Zocor 10. Rheumatoid arthritis. Patient on chronic prednisone as well as scheduled injections of Humira prior to admission. Humira Q Friday    11.  Hypertension  Continue atenolol   Good control.  12.  Hyponatremia--resolved off lasix  -136 1/24 13. ?mild gross hematuria---may be from external source. UA was negative  -appears to have resolved   LOS: 20 days A FACE TO [place] 12/21/2018, 10:51  AM

## 2018-12-21 NOTE — Discharge Summary (Signed)
NAME: Barry Woods, Barry Woods MEDICAL RECORD [id number] ACCOUNT [id number] DATE OF BIRTH:10-Mar-1946 FACILITY: [facility] LOCATION: [facility] PHYSICIAN:ZACHARY SWARTZ, MD  DISCHARGE SUMMARY  DATE OF DISCHARGE:  12/22/2018  DISCHARGE DIAGNOSES: 1.  Right basal ganglia intracerebral hemorrhage as well as left subcortical cerebrovascular accident. 2.  Sequential compression devices for deep venous thrombosis prophylaxis. 3.  Atrial fibrillation. 4.  Hyperlipidemia. 5.  Rheumatoid arthritis. 6.  Hypertension. 7. Hypokalemia. Resolved  HISTORY OF PRESENT ILLNESS/HOSPITAL COURSE:  This is a 73 year old right-handed male with history of hyperlipidemia, hypertension,  rheumatoid arthritis as well as chronic atrial fibrillation.  Recently placed on Eliquis.  Per chart review, lives alone,  was independent prior to admission.  Presented 11/27/2018 with left-sided weakness, slurred speech as well as fall.  Cranial CT scan showed right brain hemorrhage.  Per report, acute hemorrhage within the right lentiform for manipulation and use  measuring up to 3.9 cm.  Associated edema and mass effect.    The patient did receive Andexxa to reverse anticoagulation.  Also noted mild hyponatremia 131, creatinine 1.33.  Neurosurgery, Dr. Newman Pies, consulted in regards to hemorrhage, conservative care.  Followup MRI without change and hematoma.  There  was a noted 4 mm acute infarction medial left subcortical white matter.  No current plan for anticoagulation due to hemorrhage.  Initially maintained on Cleviprex for blood pressure control.  Tolerating a mechanical soft diet.  Therapy evaluations  completed and the patient was admitted for comprehensive rehabilitation program.  PAST MEDICAL HISTORY:  See discharge diagnoses.  SOCIAL HISTORY:  Lives alone.  He has family in the area.  FUNCTIONAL STATUS:  Upon admission to rehab services was moderate assist 10 feet ,max assist squat pivot transfers, moderate assist  supine to sit, max assist ADLs.  PHYSICAL EXAMINATION: VITAL SIGNS:  Blood pressure 142/86, pulse 62, temperature 97, respirations 20. GENERAL:  Alert male in no acute distress. HEENT:  EOMs intact. NECK:  Supple, nontender, no JVD. CARDIOVASCULAR:  Rate controlled. ABDOMEN:  Soft, nontender, good bowel sounds. LUNGS:  Clear to auscultation without wheeze.  REHABILITATION HOSPITAL COURSE:  The patient was admitted to inpatient rehabilitation services.  Therapies initiated on a 3-hour daily basis, consisting of physical therapy, occupational therapy, speech therapy and rehabilitation nursing.  The following  issues were addressed:    Pertaining to the patient's right basal ganglia ICH likely due to amyloid angiopathy, he would follow up neurology services.  Recently placed on Eliquis for atrial fibrillation, discontinued due to hemorrhage.  He would follow up outpatient neurology.   SCDs for DVT prophylaxis.    Blood pressure and heart rate are controlled on Tenormin monitoring for any bradycardia.  The patient had been on Lasix at home for volume management prior to admission.  Noted intermittent bouts of hyponatremia, resolved, currently off of his Lasix and would follow-up cardiology services.  It  was advised, he could use Lasix as needed at home if his weight was 2 increased by 3 pounds daily as he would be weighing himself daily. as well as follow up with cardiology services. Continue on Zocor for hyperlipidemia.  Rheumatoid arthritis.  The patient on Humira.    The patient received weekly collaborative interdisciplinary team conferences to discuss estimated length of stay, family teaching, any barriers to discharge.  Ambulating 100 feet rolling walker with a left hand orthosis, contact guard for weight shift.   Side stepping left to right rolling walker, contact guard.  Sit to stand with supervision to contact guard using a rolling walker.  He can ambulate to the bathroom rolling walker.   Contact guard to minimal assist for his balance.  Demonstration of up and  down a curb with a rolling walker.  Family going through full family teaching.  DIET:  His diet was advanced to regular.    Plan was discharge to home.  DISCHARGE MEDICATIONS:  At time of dictation included Humira 40 mg subcutaneously every 14 days.  Tenormin 75 mg p.o. daily, Senokot-S 1 tablet p.o. b.i.d., Zocor 20 mg p.o. at bedtime.  The patient could resume his Lasix as needed at home pending weight gain of 3 pounds with followup  for cardiology services.    FOLLOWUP:  The patient followed by Dr. Alger Simons at the outpatient rehab service as directed; Dr. Antony Contras, call for appointment neurology services, Dr. Angelena Form, call for appointment; Dr. Sallee Provencal medical management.  SPECIAL INSTRUCTIONS:  No driving.  No Eliquis.  AN/NUANCE D:12/21/2018 T:12/21/2018 JOB:[id number]

## 2018-12-21 NOTE — Progress Notes (Signed)
Occupational Therapy Discharge Summary  Patient Details  Name: Barry Woods MRN: [id number] Date of Birth: 1946-06-18  Today's Date: 12/21/2018 OT Individual Time: 1015-1130 and 1530-1600 OT Individual Time Calculation (min): 75 min and 30 min    Patient has met 10 of 10 long term goals due to improved activity tolerance, improved balance, postural control, ability to compensate for deficits, functional use of  LEFT upper and LEFT lower extremity, improved attention, improved awareness and improved coordination.  Patient to discharge at overall S - min A level. OT family education recommended and offered to caregivers but they declined to attend. They have been told that OT does not recommend shower transfer at this time secondary to it being on second floor.  Reasons goals not met: all goals met  Recommendation:  Patient will benefit from ongoing skilled OT services in home health setting to continue to advance functional skills in the area of BADL, iADL and Reduce care partner burden.  Equipment: BSC  Reasons for discharge: treatment goals met  Patient/family agrees with progress made and goals achieved: Yes   OT Intervention:  Session 1: Upon entering the room, pt seated in wheelchair awaiting OT arrival. Pt with no c/o pain. Pt requesting to shower this session with S for sit >stand. Pt ambulating with RW and min guard into bathroom and onto TTB. Pt doffing clothing items from TTB for safety. Bathing at overall supervision level while seated with stand while holding onto grab bar to wash buttocks at supervision. Pt exiting the shower and donning clothing items from wheelchair at sink with min cuing for hemiplegic dressing techniques. Pt required min guard for standing balance with LB clothing management. Pt performed all grooming tasks independently while seated in wheelchair. Pt remained in chair with chair alarm donned and call bell within reach.   Session 2: Upon entering the  room, pt seated in wheelchair and agreeable to OT intervention. OT assisted pt via wheelchair for time management to [place]. Pt standing for dynavision task x 2 reps for 2 minutes each with B UEs unsupported. Pt required S - min guard for standing balance while reaching for targets. All targets on L side of board must be hit with L hand and vice versa for R side of board. Pt able to decrease reaction time with L hand by 1 second since last week. Pt ambulating 75' back to room with RW and min guard. Pt seated in recliner chair with chair alarm donned and call bell within reach.   OT Discharge Precautions/Restrictions  Precautions Precautions: Fall Precaution Comments: left inattention Restrictions Weight Bearing Restrictions: No Vital Signs Therapy Vitals Temp: 98.3 F (36.8 C) Temp Source: Oral Pulse Rate: 64 Resp: 18 BP: 117/68 Patient Position (if appropriate): Sitting Oxygen Therapy SpO2: 97 % O2 Device: Room Air Pain Pain Assessment Pain Scale: 0-10 Pain Score: 0-No pain ADL ADL Eating: Moderate cueing, Contact guard Where Assessed-Eating: Edge of bed Upper Body Dressing: Minimal assistance Where Assessed-Upper Body Dressing: Edge of bed Lower Body Dressing: Maximal assistance Where Assessed-Lower Body Dressing: Edge of bed Toilet Transfer: Moderate assistance Toilet Transfer Method: Stand pivot Vision Baseline Vision/History: No visual deficits Patient Visual Report: No change from baseline Vision Assessment?: No apparent visual deficits Perception  Perception: Impaired Inattention/Neglect: Does not attend to left side of body Spatial Orientation: decreased with weight shift L Comments: decreased spatial awareness and L inattention Praxis Praxis: Intact Cognition Overall Cognitive Status: Impaired/Different from baseline Arousal/Alertness: Awake/alert Orientation Level: Oriented X4 Attention:  Selective Sustained Attention: Impaired Selective Attention:  Impaired Selective Attention Impairment: Verbal basic;Functional basic Alternating Attention: Impaired Memory: Impaired Memory Impairment: Retrieval deficit;Decreased recall of new information Awareness: Impaired Awareness Impairment: Emergent impairment;Anticipatory impairment Problem Solving: Impaired Problem Solving Impairment: Verbal basic;Functional basic Executive Function: Self Monitoring;Self Correcting;Reasoning;Decision Making Reasoning: Impaired Reasoning Impairment: Verbal basic;Functional basic Decision Making: Impaired Decision Making Impairment: Verbal basic;Functional basic Self Monitoring: Impaired Self Monitoring Impairment: Verbal basic;Functional basic Self Correcting: Impaired Self Correcting Impairment: Verbal basic;Functional basic Behaviors: Impulsive;Perseveration;Poor frustration tolerance Safety/Judgment: Impaired Comments: decreased awareness, impulsivity, perseverative talking Sensation Sensation Light Touch: Impaired Detail Light Touch Impaired Details: Impaired LUE;Absent LLE Proprioception: Impaired Detail Proprioception Impaired Details: Impaired LLE Coordination Gross Motor Movements are Fluid and Coordinated: No Fine Motor Movements are Fluid and Coordinated: No Coordination and Movement Description: L hemi Heel Shin Test: intact B, decreased speed L to R Motor  Motor Motor: Hemiplegia Motor - Skilled Clinical Observations: L side weakness, L lateral lean in standing Motor - Discharge Observations: improved since eval, ongoing L hemi and L lateral lean with gait Mobility  Bed Mobility Bed Mobility: Rolling Right;Rolling Left;Supine to Sit;Sit to Supine Rolling Right: Supervision/verbal cueing Rolling Left: Supervision/Verbal cueing Supine to Sit: Supervision/Verbal cueing Sit to Supine: Supervision/Verbal cueing Transfers Sit to Stand: Supervision/Verbal cueing Stand to Sit: Supervision/Verbal cueing  Trunk/Postural Assessment   Cervical Assessment Cervical Assessment: Exceptions to WFL(forward head) Thoracic Assessment Thoracic Assessment: Exceptions to WFL(kyphotic) Lumbar Assessment Lumbar Assessment: Exceptions to WFL(posterior pelvic tilt) Postural Control Postural Control: Deficits on evaluation Postural Limitations: delayed righting reactions  Balance Balance Balance Assessed: Yes Static Sitting Balance Static Sitting - Balance Support: No upper extremity supported;Feet supported Static Sitting - Level of Assistance: 7: Independent Dynamic Sitting Balance Dynamic Sitting - Balance Support: No upper extremity supported;Feet supported;During functional activity Dynamic Sitting - Level of Assistance: 5: Stand by assistance Static Standing Balance Static Standing - Balance Support: Bilateral upper extremity supported;During functional activity Static Standing - Level of Assistance: 5: Stand by assistance Dynamic Standing Balance Dynamic Standing - Balance Support: Bilateral upper extremity supported;During functional activity Dynamic Standing - Level of Assistance: 4: Min assist Extremity/Trunk Assessment RUE Assessment RUE Assessment: Within Functional Limits LUE Assessment LUE Assessment: Exceptions to [facility] Passive Range of Motion (PROM) Comments: [facility] General Strength Comments: 3+/5 Brunstrum level for arm: Stage V Relative Independence from [employer]   Jevin Camino P 12/21/2018, 4:25 PM

## 2018-12-21 NOTE — Progress Notes (Signed)
Speech Language Pathology Discharge Summary  Patient Details  Name: Barry Woods MRN: [id number] Date of Birth: 11/23/1946  Today's Date: 12/21/2018 SLP Individual Time: 0830-0900 SLP Individual Time Calculation (min): 30 min   Skilled Therapeutic Interventions:  Skilled treatment session focused on cognition goals. SLP facilitated session by providing handout on compensatory memory strategies. Pt with decreased attention to strategies and offered off-topic points that he felt related to strategies presented on handout. Pt with no self-awareness and despite SLP's attempts to redirect pt he frequently over-talked SLP. Pt was left upright in wheelchair, lap alarm in place and all needs within reach.     Patient has met 8 of 8 long term goals.  Patient to discharge at overall Mod level.    Clinical Impression/Discharge Summary:   Pt has made progress in skilled ST sessions. He does however require Mod to occasional Max A cognitive support specifically in the areas of memory, emergent awareness, left inattention, semi-complex problem solving and overall receptivity to instruction. As a result, to continues to struggle with use of left head turn when consuming PO intake. Education has been provided to pt and this Probation officer spoke to pt's daughter regarding aspiration risks and POC at discharge for follow up ST services. Education also provided to pt's daughter on s/s of pneumonia. HHST is requested to target the above mentioned deficits and promote use of compensatory swallow strategy (left head turn).   Care Partner:  Caregiver Able to Provide Assistance: Yes(pt has hired live-in Marine scientist)  Type of Caregiver Assistance: Physical;Cognitive  Recommendation:  [place] SLP;24 hour supervision/assistance  Rationale for SLP Follow Up: Maximize cognitive function and independence;Maximize swallowing safety;Reduce caregiver burden   Equipment:     Reasons for discharge: Discharged from hospital    Patient/Family Agrees with Progress Made and Goals Achieved: Yes    Laurieanne Galloway 12/21/2018, 1:38 PM

## 2018-12-21 NOTE — Discharge Summary (Signed)
Discharge summary job # [id number]

## 2018-12-21 NOTE — Plan of Care (Signed)
  Problem: Consults Goal: RH STROKE PATIENT EDUCATION Description See Patient Education module for education specifics  Outcome: Progressing   Problem: RH SKIN INTEGRITY Goal: RH STG MAINTAIN SKIN INTEGRITY WITH ASSISTANCE Description STG Maintain Skin Integrity With min Assistance.  Outcome: Progressing   Problem: RH KNOWLEDGE DEFICIT Goal: RH STG INCREASE KNOWLEDGE OF HYPERTENSION Description Patient and family will demonstrate knowledge on HTN medications and dietary restrictions with min assist from rehab staff before discharge.  Outcome: Progressing Goal: RH STG INCREASE KNOWLEDGE OF STROKE PROPHYLAXIS Description Patient and family will demonstrate knowledge of ways to prevent future strokes with medications, dietary restrictions, and exercise management with min assist from rehab staff before discharge.  Outcome: Progressing

## 2018-12-21 NOTE — Progress Notes (Signed)
Physical Therapy Discharge Summary  Patient Details  Name: Barry Woods MRN: [id number] Date of Birth: [birth year]  Today's Date: 12/21/2018 PT Individual Time: 1400-1500 PT Individual Time Calculation (min): 60 min    Patient has met 12 of 12 long term goals due to improved activity tolerance, improved balance, improved postural control, increased strength, ability to compensate for deficits, improved attention, improved awareness and improved coordination.  Patient to discharge at an ambulatory level with CGA and wheelchair level at Supervision.   Patient's care partner is independent to provide the necessary physical and cognitive assistance at discharge. Pt's daughter was present for family training session 1/28 and was able to take video of how to safely assist pt so that she can train caregiver that will be with patient 24/7.  Reasons goals not met: Pt has met all rehab goals.  Recommendation:  Patient will benefit from ongoing skilled PT services in home health setting to continue to advance safe functional mobility, address ongoing impairments in balance, strength, endurance, safety, L side awareness, and minimize fall risk.  Equipment: RW, 16x16 manual w/c  Reasons for discharge: treatment goals met and discharge from hospital  Patient/family agrees with progress made and goals achieved: Yes   Skilled Intervention: Pt received seated in w/c in room, agreeable to PT, no complaints of pain. Toilet transfer with RW and CGA. Ambulation x 150 with RW with L hand orthosis and CGA for lateral weight shift, v/c for attention to LLE for clearance. Ascend/descend 4 stairs forwards with 2 handrails and CGA, 4 stairs laterally with L handrail and CGA to simulate pt's home environment. Car transfer with CGA, v/c for safe transfer technique. Floor transfer with min A. Pt left seated in w/c in room with needs in reach, quick release belt and chair alarm in place.  PT  Discharge Precautions/Restrictions Precautions Precautions: Fall Precaution Comments: left inattention Restrictions Weight Bearing Restrictions: No Pain Pain Assessment Pain Scale: 0-10 Pain Score: 0-No pain Vision/Perception  Vision - History Baseline Vision: No visual deficits Perception Perception: Impaired Inattention/Neglect: Does not attend to left side of body Spatial Orientation: decreased with weight shift L Comments: decreased spatial awareness and L inattention Praxis Praxis: Intact  Cognition Overall Cognitive Status: Impaired/Different from baseline Arousal/Alertness: Awake/alert Orientation Level: Oriented X4 Attention: Selective Sustained Attention: Impaired Selective Attention: Impaired Selective Attention Impairment: Verbal basic;Functional basic Alternating Attention: Impaired Memory: Impaired Memory Impairment: Retrieval deficit;Decreased recall of new information Awareness: Impaired Awareness Impairment: Emergent impairment;Anticipatory impairment Problem Solving: Impaired Problem Solving Impairment: Verbal basic;Functional basic Executive Function: Self Monitoring;Self Correcting;Reasoning;Decision Making Reasoning: Impaired Reasoning Impairment: Verbal basic;Functional basic Decision Making: Impaired Decision Making Impairment: Verbal basic;Functional basic Self Monitoring: Impaired Self Monitoring Impairment: Verbal basic;Functional basic Self Correcting: Impaired Self Correcting Impairment: Verbal basic;Functional basic Behaviors: Impulsive;Perseveration;Poor frustration tolerance Safety/Judgment: Impaired Comments: decreased awareness, impulsivity, perseverative talking Sensation Sensation Light Touch: Impaired Detail Light Touch Impaired Details: Impaired LUE;Absent LLE Proprioception: Impaired Detail Proprioception Impaired Details: Impaired LLE Coordination Gross Motor Movements are Fluid and Coordinated: No Fine Motor Movements are  Fluid and Coordinated: No Coordination and Movement Description: L hemi Heel Shin Test: intact B, decreased speed L to R Motor  Motor Motor: Hemiplegia Motor - Skilled Clinical Observations: L side weakness, L lateral lean in standing Motor - Discharge Observations: improved since eval, ongoing L hemi and L lateral lean with gait  Mobility Bed Mobility Bed Mobility: Rolling Right;Rolling Left;Supine to Sit;Sit to Supine Rolling Right: Supervision/verbal cueing Rolling Left: Supervision/Verbal cueing Supine to Sit: Supervision/Verbal cueing  Sit to Supine: Supervision/Verbal cueing Transfers Transfers: Sit to Stand;Stand to Sit;Stand Pivot Transfers;Squat Pivot Transfers Sit to Stand: Supervision/Verbal cueing Stand to Sit: Supervision/Verbal cueing Stand Pivot Transfers: Contact Guard/Touching assist Stand Pivot Transfer Details: Verbal cues for sequencing;Verbal cues for technique;Verbal cues for precautions/safety;Verbal cues for safe use of DME/AE Squat Pivot Transfers: Contact Guard/Touching assist Transfer (Assistive device): Rolling walker Locomotion  Gait Ambulation: Yes Gait Assistance: Contact Guard/Touching assist Gait Distance (Feet): 150 Feet Assistive device: Rolling walker Gait Assistance Details: Manual facilitation for weight shifting;Verbal cues for precautions/safety;Verbal cues for safe use of DME/AE Gait Gait: Yes Gait Pattern: Impaired(decreased LLE clearance due to increased weight shift to L) Gait Pattern: Decreased step length - left;Decreased weight shift to right Trunk - Swing Phase- Impaired Gait Pattern: Lateral lean to Left Gait velocity: decreased Stairs / Additional Locomotion Stairs: Yes Stairs Assistance: Contact Guard/Touching assist Stair Management Technique: Two rails;Step to pattern;Forwards;One rail Left;Sideways Number of Stairs: 8 Height of Stairs: 6 Wheelchair Mobility Wheelchair Mobility: Yes Wheelchair Assistance:  Chartered loss adjuster: Right upper extremity;Right lower extremity Wheelchair Parts Management: Needs assistance Distance: 150  Trunk/Postural Assessment  Cervical Assessment Cervical Assessment: Exceptions to WFL(forward head) Thoracic Assessment Thoracic Assessment: Exceptions to WFL(rounded shoulders, kyphotic) Lumbar Assessment Lumbar Assessment: Exceptions to WFL(posterior pelvic tilt) Postural Control Postural Control: Deficits on evaluation Postural Limitations: delayed righting reactions  Balance Balance Balance Assessed: Yes Static Sitting Balance Static Sitting - Balance Support: No upper extremity supported;Feet supported Static Sitting - Level of Assistance: 7: Independent Dynamic Sitting Balance Dynamic Sitting - Balance Support: No upper extremity supported;Feet supported;During functional activity Dynamic Sitting - Level of Assistance: 5: Stand by assistance Static Standing Balance Static Standing - Balance Support: Bilateral upper extremity supported;During functional activity Static Standing - Level of Assistance: 5: Stand by assistance Dynamic Standing Balance Dynamic Standing - Balance Support: Bilateral upper extremity supported;During functional activity Dynamic Standing - Level of Assistance: 4: Min assist Extremity Assessment   RLE Assessment RLE Assessment: Within Functional Limits Active Range of Motion (AROM) Comments: WFL General Strength Comments: 5/5 grossly LLE Assessment LLE Assessment: Exceptions to [facility] Active Range of Motion (AROM) Comments: WFL General Strength Comments: 4+/5 grossly     Excell Seltzer, PT, DPT 12/21/2018, 3:58 PM

## 2018-12-22 LAB — BASIC METABOLIC PANEL
Anion gap: 6 (ref 5–15)
BUN: 11 mg/dL (ref 8–23)
CALCIUM: 8.9 mg/dL (ref 8.9–10.3)
CO2: 28 mmol/L (ref 22–32)
Chloride: 103 mmol/L (ref 98–111)
Creatinine, Ser: 1.06 mg/dL (ref 0.61–1.24)
GFR calc Af Amer: >60 mL/min (ref >60)
GFR calc non Af Amer: >60 mL/min (ref >60)
Glucose, Bld: 89 mg/dL (ref 70–99)
Potassium: 3.8 mmol/L (ref 3.5–5.1)
SODIUM: 137 mmol/L (ref 135–145)

## 2018-12-22 MED ORDER — POTASSIUM CHLORIDE CRYS ER 20 MEQ PO TBCR: 20.0000 meq | EXTENDED_RELEASE_TABLET | Freq: Two times a day (BID) | ORAL | 0 refills | Status: DC

## 2018-12-22 MED ORDER — SIMVASTATIN 20 MG PO TABS: 20.0000 mg | ORAL_TABLET | Freq: Every day | ORAL | 4 refills | Status: DC

## 2018-12-22 MED ORDER — SENNOSIDES-DOCUSATE SODIUM 8.6-50 MG PO TABS: 1.0000 | ORAL_TABLET | Freq: Two times a day (BID) | ORAL | Status: DC

## 2018-12-22 MED ORDER — ATENOLOL 25 MG PO TABS: 75.0000 mg | ORAL_TABLET | Freq: Every day | ORAL | 1 refills | Status: DC

## 2018-12-22 MED ORDER — ACETAMINOPHEN 325 MG PO TABS: 650.0000 mg | ORAL_TABLET | ORAL | Status: DC | PRN

## 2018-12-22 NOTE — Progress Notes (Signed)
[place] [place] PHYSICAL MEDICINE & REHABILITATION PROGRESS NOTE   Subjective/Complaints:   Pt excited to be going home. No new complaints. "surprised" by lasix  ROS: Patient denies fever, rash, sore throat, blurred vision, nausea, vomiting, diarrhea, cough, shortness of breath or chest pain, joint or back pain, headache, or mood change.   .  Objective:   No results found. No results for input(s): WBC, HGB, HCT, PLT in the last 72 hours. Recent Labs    12/22/18 0548  NA 137  K 3.8  CL 103  CO2 28  GLUCOSE 89  BUN 11  CREATININE 1.06  CALCIUM 8.9    Intake/Output Summary (Last 24 hours) at 12/22/2018 0948 Last data filed at 12/22/2018 0800 Gross per 24 hour  Intake 780 ml  Output 300 ml  Net 480 ml     Physical Exam: Vital Signs Blood pressure 137/69, pulse 60, temperature 98 F (36.7 C), temperature source Oral, resp. rate 16, height 5\' 7"  (1.702 m), weight 69.9 kg, SpO2 95 %.  Constitutional: No distress . Vital signs reviewed. HEENT: EOMI, oral membranes moist Neck: supple Cardiovascular: RRR without murmur. No JVD    Respiratory: CTA Bilaterally without wheezes or rales. Normal effort    GI: BS +, non-tender, non-distended  Neurologic:improved memory, insight and awareness Motor:  LUE: 2+/5 delt, 3+/5bi , tri. 3/5 grip LLE:, 4-/5 HF, KE, 3+ to 4/5 ADF/PF-stable Senses pain in all 4's, decreased proprioception LLE Skin: intact. Psych: pleasant  Assessment/Plan: 1. 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  Physiatrist is providing close team supervision and 24 hour management of active medical problems listed below.  Physiatrist and rehab team continue to assess barriers to discharge/monitor patient progress toward functional and medical goals  Care Tool:  Bathing  Bathing activity did not occur: (Not assessed) Body parts bathed by  patient: Left arm, Chest, Abdomen, Front perineal area, Right upper leg, Left upper leg, Face, Right lower leg, Left lower leg, Right arm, Buttocks   Body parts bathed by helper: Buttocks     Bathing assist Assist Level: Supervision/Verbal cueing     Upper Body Dressing/Undressing Upper body dressing   What is the patient wearing?: Pull over shirt    Upper body assist Assist Level: Independent    Lower Body Dressing/Undressing Lower body dressing      What is the patient wearing?: Pants, Incontinence brief     Lower body assist Assist for lower body dressing: Minimal Assistance - Patient > 75%     Toileting Toileting Toileting Activity did not occur (Clothing management and hygiene only): N/A (no void or bm)  Toileting assist Assist for toileting: Contact Guard/Touching assist     Transfers Chair/bed transfer  Transfers assist     Chair/bed transfer assist level: Contact Guard/Touching assist     Locomotion Ambulation   Ambulation assist      Assist level: Contact Guard/Touching assist Assistive device: Walker-rolling Max distance: 150'   Walk 10 feet activity   Assist     Assist level: Contact Guard/Touching assist Assistive device: Walker-rolling   Walk 50 feet activity   Assist Walk 50 feet with 2 turns activity did not occur: Safety/medical concerns  Assist level: Contact Guard/Touching assist Assistive device: Walker-rolling    Walk 150 feet activity   Assist Walk 150 feet activity did not occur: Safety/medical concerns  Assist level: Contact Guard/Touching assist Assistive device: Walker-rolling  Walk 10 feet on uneven surface  activity   Assist Walk 10 feet on uneven surfaces activity did not occur: Safety/medical concerns         Wheelchair     Assist Will patient use wheelchair at discharge?: Yes Type of Wheelchair: Manual    Wheelchair assist level: Supervision/Verbal cueing Max wheelchair distance: 150'     Wheelchair 50 feet with 2 turns activity    Assist        Assist Level: Supervision/Verbal cueing   Wheelchair 150 feet activity     Assist Wheelchair 150 feet activity did not occur: Safety/medical concerns   Assist Level: Supervision/Verbal cueing     Medical Problem List and Plan: 1.Left side weakness with dysarthriasecondary to 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    -pt is not to resume anticoagulation due to [place] and risk factor control/treatment recommended at this point with outpt neurology follow up upon discharge home 2. DVT Prophylaxis/Anticoagulation: SCDs. Monitor for any signs of DVT 3. Pain Management:Tylenol as needed 4. Mood:Provide emotional support 5. Neuropsych: This patientiscapable of making decisions on hisown behalf. 6. Skin/Wound Care:Routine skin checks 7. Fluids/Electrolytes/Nutrition:encourage PO  -itolerating regular diet 8. Atrial fibrillation. ELIQUIS discontinued due to [place]. Cardiac rate controlled. Continue Tenormin 75 mg daily.    - pt on 20-40mg  lasix at home at times for volume mgt  -lasix held due to hyponatremia    -lasix 20mg  given x 1 1/20 with improvement, weight up a few kg over last couple days, repeat lasix 20mg  po x1 on 1/29  -discussed weight/lasix mgt with patient  -he needs to follow up with cardiology to discuss long term plan re: cardiac regimen Filed Weights   12/21/18 0500 12/22/18 0424 12/22/18 0751  Weight: 73.1 kg 70.1 kg 69.9 kg   -continue potassium supplement   20 mEq twice daily   -K+ only 3.8 likely d/t lasix 9. Hyperlipidemia. Zocor 10. Rheumatoid arthritis. Patient on chronic prednisone as well as scheduled injections of Humira prior to admission. Humira Q Friday    11.  Hypertension  Continue atenolol   Good control.  12.   Hyponatremia--resolved off lasix  -137     LOS: 21 days A FACE TO [place] 12/22/2018, 9:48 AM

## 2018-12-22 NOTE — Progress Notes (Signed)
Pt weight on standing scale is 154# showing a 7# loss from yesterday before lasix.  Pt aware to weigh at home to check weight on his scale and then to weigh daily and f/u with cardiologist re: A fib and lasix use.

## 2018-12-22 NOTE — Discharge Instructions (Signed)
Inpatient Rehab Discharge Instructions  Barry Woods Discharge date and time: No discharge date for patient encounter.   Activities/Precautions/ Functional Status: Activity: activity as tolerated Diet: regular Wound Care: none needed Functional status:  ___ No restrictions     ___ Walk up steps independently ___ 24/7 supervision/assistance   ___ Walk up steps with assistance ___ Intermittent supervision/assistance  ___ Bathe/dress independently ___ Walk with walker     _x__ Bathe/dress with assistance ___ Walk Independently    ___ Shower independently ___ Walk with assistance    ___ Shower with assistance ___ No alcohol     ___ Return to work/school ________      COMMUNITY REFERRALS UPON DISCHARGE:    Home Health:   PT     OT     ST                     Agency:  [facility] Phone: [phone]   Medical Equipment/Items Ordered:  Wheelchair, cushion, hospital bed, commode, walker                                                      Agency/Supplier:  [facility] @ [phone]   GENERAL COMMUNITY RESOURCES FOR PATIENT/FAMILY:  Support Groups:  Stroke Support Group                              2nd Thursday of each month (Sept - May)                              Inpatient Rehab @ [facility] ([address]                              6-7:00 pm        Special Instructions: No driving  No Eliquis  Follow up with CARDIOLOGY services for volume management and lasix therapy   My questions have been answered and I understand these instructions. I will adhere to these goals and the provided educational materials after my discharge from the hospital.  Patient/Caregiver Signature _______________________________ Date __________  Clinician Signature _______________________________________ Date __________  Please bring this form and your medication list with you to all your follow-up doctor's appointments.

## 2018-12-22 NOTE — Progress Notes (Signed)
Pt discharged in w/c with belongings, family with pt, Dan completed d/c teaching with pt, pt to f/u with cardiologist re: A fib and if / when lasix is needed.  Pt without c/o, no questions, family with pt.

## 2018-12-22 NOTE — Progress Notes (Signed)
Social Work  Discharge Note  The overall goal for the admission was met for:   Discharge location: Yes - pt returning to his home where family has hired private duty caregiver to cover 24/7.  Length of Stay: Yes - 21 days  Discharge activity level: Yes - supervision to min assist  Home/community participation: Yes  Services provided included: MD, RD, PT, OT, SLP, RN, TR, Pharmacy, Hydro:   [employer] Medicare  Follow-up services arranged: Home Health: PT, OT, ST via [facility], DME: 18x18 lightweight w/c, cushion, rolling walker, hospital bed and 3n1 via [place] and Patient/Family has no preference for HH/DME agencies  Comments (or additional information):  Patient/Family verbalized understanding of follow-up arrangements: Yes  Individual responsible for coordination of the follow-up plan: pt  Confirmed correct DME delivered: Weylin Plagge 12/22/2018    Treyshawn Muldrew

## 2018-12-22 NOTE — Plan of Care (Signed)
  Problem: Consults Goal: RH STROKE PATIENT EDUCATION Description See Patient Education module for education specifics  Outcome: Completed/Met   Problem: RH SKIN INTEGRITY Goal: RH STG MAINTAIN SKIN INTEGRITY WITH ASSISTANCE Description STG Maintain Skin Integrity With min Assistance.  Outcome: Completed/Met   Problem: RH KNOWLEDGE DEFICIT Goal: RH STG INCREASE KNOWLEDGE OF HYPERTENSION Description Patient and family will demonstrate knowledge on HTN medications and dietary restrictions with min assist from rehab staff before discharge.  Outcome: Completed/Met Goal: RH STG INCREASE KNOWLEDGE OF STROKE PROPHYLAXIS Description Patient and family will demonstrate knowledge of ways to prevent future strokes with medications, dietary restrictions, and exercise management with min assist from rehab staff before discharge.  Outcome: Completed/Met    Pt /family independent with care with min A, goals met

## 2018-12-23 ENCOUNTER — Telehealth: Payer: Self-pay | Admitting: Registered Nurse

## 2018-12-23 ENCOUNTER — Encounter: Payer: Self-pay | Admitting: Adult Health

## 2018-12-23 DIAGNOSIS — I69122 Dysarthria following nontraumatic intracerebral hemorrhage: Secondary | ICD-10-CM | POA: Diagnosis not present

## 2018-12-23 DIAGNOSIS — M069 Rheumatoid arthritis, unspecified: Secondary | ICD-10-CM | POA: Diagnosis not present

## 2018-12-23 DIAGNOSIS — I482 Chronic atrial fibrillation, unspecified: Secondary | ICD-10-CM | POA: Diagnosis not present

## 2018-12-23 DIAGNOSIS — I110 Hypertensive heart disease with heart failure: Secondary | ICD-10-CM | POA: Diagnosis not present

## 2018-12-23 DIAGNOSIS — I5030 Unspecified diastolic (congestive) heart failure: Secondary | ICD-10-CM | POA: Diagnosis not present

## 2018-12-23 DIAGNOSIS — I69154 Hemiplegia and hemiparesis following nontraumatic intracerebral hemorrhage affecting left non-dominant side: Secondary | ICD-10-CM | POA: Diagnosis not present

## 2018-12-23 DIAGNOSIS — J849 Interstitial pulmonary disease, unspecified: Secondary | ICD-10-CM | POA: Diagnosis not present

## 2018-12-23 DIAGNOSIS — I69128 Other speech and language deficits following nontraumatic intracerebral hemorrhage: Secondary | ICD-10-CM | POA: Diagnosis not present

## 2018-12-23 DIAGNOSIS — G250 Essential tremor: Secondary | ICD-10-CM | POA: Diagnosis not present

## 2018-12-23 DIAGNOSIS — I69115 Cognitive social or emotional deficit following nontraumatic intracerebral hemorrhage: Secondary | ICD-10-CM | POA: Diagnosis not present

## 2018-12-23 NOTE — Telephone Encounter (Signed)
Transitional Care call Transitional Care Call Completed  Patient name: Barry Woods  DOB: [birth year] 1. Are you/is patient experiencing any problems since coming home? No a. Are there any questions regarding any aspect of care? No 2. Are there any questions regarding medications administration/dosing? No a. Are meds being taken as prescribed? Yes b. "Patient should review meds with caller to confirm" Medication List Reviewed. 3. Have there been any falls? No 4. Has Home Health been to the house and/or have they contacted you? Yes, [facility]. a. If not, have you tried to contact them? NA b. Can we help you contact them? NA 5. Are bowels and bladder emptying properly? Yes a. Are there any unexpected incontinence issues? No b. If applicable, is patient following bowel/bladder programs? NA 6. Any fevers, problems with breathing, unexpected pain? No 7. Are there any skin problems or new areas of breakdown? No 8. Has the patient/family member arranged specialty MD follow up (ie cardiology/neurology/renal/surgical/etc.)?  [name] dtates he will call Dr. Leonie Man office to schedule HFU. Other appointments have been scheduled.  a. Can we help arrange? NA 9. Does the patient need any other services or support that we can help arrange? No 10. Are caregivers following through as expected in assisting the patient? Yes 11. Has the patient quit smoking, drinking alcohol, or using drugs as recommended? [name] states he doesn't smoke, drink alcohol or use illicit drugs.   Appointment date/time 01/02/2019  arrival time 10:00 for 10:20 appointment with Dr. Naaman Plummer. At [place]

## 2018-12-26 ENCOUNTER — Telehealth: Payer: Self-pay

## 2018-12-26 DIAGNOSIS — I482 Chronic atrial fibrillation, unspecified: Secondary | ICD-10-CM | POA: Diagnosis not present

## 2018-12-26 DIAGNOSIS — I69122 Dysarthria following nontraumatic intracerebral hemorrhage: Secondary | ICD-10-CM | POA: Diagnosis not present

## 2018-12-26 DIAGNOSIS — I5030 Unspecified diastolic (congestive) heart failure: Secondary | ICD-10-CM | POA: Diagnosis not present

## 2018-12-26 DIAGNOSIS — J849 Interstitial pulmonary disease, unspecified: Secondary | ICD-10-CM | POA: Diagnosis not present

## 2018-12-26 DIAGNOSIS — I69154 Hemiplegia and hemiparesis following nontraumatic intracerebral hemorrhage affecting left non-dominant side: Secondary | ICD-10-CM | POA: Diagnosis not present

## 2018-12-26 DIAGNOSIS — I110 Hypertensive heart disease with heart failure: Secondary | ICD-10-CM | POA: Diagnosis not present

## 2018-12-26 DIAGNOSIS — M069 Rheumatoid arthritis, unspecified: Secondary | ICD-10-CM | POA: Diagnosis not present

## 2018-12-26 DIAGNOSIS — I69128 Other speech and language deficits following nontraumatic intracerebral hemorrhage: Secondary | ICD-10-CM | POA: Diagnosis not present

## 2018-12-26 DIAGNOSIS — G250 Essential tremor: Secondary | ICD-10-CM | POA: Diagnosis not present

## 2018-12-26 DIAGNOSIS — I69115 Cognitive social or emotional deficit following nontraumatic intracerebral hemorrhage: Secondary | ICD-10-CM | POA: Diagnosis not present

## 2018-12-26 NOTE — Telephone Encounter (Signed)
Copied from [place] [id number]. Topic: Quick Communication - Home Health Verbal Orders >> Dec 26, 2018  8:34 AM [place], Lanice Schwab wrote: Patient released from [place] on 12/22/18.  Penni Bombard, PT saw [name] on 12/23/18.  Would like verbal orders for home PT post stroke.

## 2018-12-26 NOTE — Telephone Encounter (Signed)
Ebony Hail, ST from [facility] called stating pt is refusing ST. She states she went over with him what she could do for him and he states he feels he does not need ST. He is continuing with PT and OT.

## 2018-12-27 ENCOUNTER — Telehealth: Payer: Self-pay | Admitting: *Deleted

## 2018-12-27 ENCOUNTER — Inpatient Hospital Stay: Payer: Medicare HMO | Admitting: Adult Health

## 2018-12-27 ENCOUNTER — Telehealth: Payer: Self-pay

## 2018-12-27 NOTE — Telephone Encounter (Signed)
Transition Care Management Follow-up Telephone Call   Date discharged?  DATE OF DISCHARGE:  12/22/2018   How have you been since you were released from the hospital? "been doing okay, I have an appt with Cardiologist coming up, they are treating my A-Fib and following up on my stroke. I plan to follow up with Tommi Rumps after I see the Cardiologist. "   Do you understand why you were in the hospital? yes   Do you understand the discharge instructions? yes   Where were you discharged to? Home   Items Reviewed:  Medications reviewed: yes, " no changes, all the same"  Allergies reviewed: yes  Dietary changes reviewed: yes  Referrals reviewed: yes, Cardiology appt 12/28/2018  Functional Questionnaire:   Activities of Daily Living (ADLs):   He states they are independent in the following: ambulation, bathing and hygiene, feeding, continence, grooming, toileting and dressing States they require assistance with the following: n/a   Any transportation issues/concerns?: no   Any patient concerns? no   Confirmed importance and date/time of follow-up visits scheduled yes  Provider Appointment not scheduled  - pt states that he is following up with Cards first and then will call the office to schedule a HFU with [facility].   Confirmed with patient if condition begins to worsen call PCP or go to the ER.  Patient was given the office number and encouraged to call back with question or concerns.  : yes

## 2018-12-27 NOTE — Telephone Encounter (Signed)
Ok for verbal orders ?

## 2018-12-27 NOTE — Telephone Encounter (Signed)
Left a detailed message on identified voicemail informing Barry Woods to proceed with order for PT.  Advised a call back if any questions.  Nothing further needed at this time.

## 2018-12-27 NOTE — Telephone Encounter (Signed)
Ritu, OT from [facility] called requesting verbal orders for HHOT 2wk2 and 1wk1. Orders approved and given per discharge summary.

## 2018-12-28 ENCOUNTER — Ambulatory Visit: Payer: Medicare HMO | Admitting: Cardiology

## 2018-12-28 ENCOUNTER — Encounter: Payer: Self-pay | Admitting: Cardiology

## 2018-12-28 VITALS — BP 116/68 | HR 64 | Ht 67.0 in | Wt 155.0 lb

## 2018-12-28 DIAGNOSIS — I4891 Unspecified atrial fibrillation: Secondary | ICD-10-CM

## 2018-12-28 DIAGNOSIS — Z79899 Other long term (current) drug therapy: Secondary | ICD-10-CM

## 2018-12-28 DIAGNOSIS — E785 Hyperlipidemia, unspecified: Secondary | ICD-10-CM

## 2018-12-28 LAB — BASIC METABOLIC PANEL
BUN/Creatinine Ratio: 10 (ref 10–24)
BUN: 10 mg/dL (ref 8–27)
CO2: 22 mmol/L (ref 20–29)
Calcium: 9.7 mg/dL (ref 8.6–10.2)
Chloride: 99 mmol/L (ref 96–106)
Creatinine, Ser: 1.04 mg/dL (ref 0.76–1.27)
GFR calc non Af Amer: 72 mL/min/{1.73_m2} (ref >59)
GFR, EST AFRICAN AMERICAN: 84 mL/min/{1.73_m2} (ref >59)
Glucose: 81 mg/dL (ref 65–99)
Potassium: 4.3 mmol/L (ref 3.5–5.2)
Sodium: 136 mmol/L (ref 134–144)

## 2018-12-28 LAB — HEPATIC FUNCTION PANEL
ALT: 24 IU/L (ref 0–44)
AST: 27 IU/L (ref 0–40)
Albumin: 3.9 g/dL (ref 3.8–4.8)
Alkaline Phosphatase: 59 IU/L (ref 39–117)
Bilirubin Total: 1.5 mg/dL — ABNORMAL HIGH (ref 0.0–1.2)
Bilirubin, Direct: 0.39 mg/dL (ref 0.00–0.40)
Total Protein: 8.2 g/dL (ref 6.0–8.5)

## 2018-12-28 NOTE — Addendum Note (Signed)
Addended by: Eulis Foster on: 12/28/2018 09:29 AM   Modules accepted: Orders

## 2018-12-28 NOTE — Progress Notes (Signed)
12/28/2018 Barry Woods   Mar 08, 1946  [id number]  Primary Physician Nafziger, Tommi Rumps, NP Primary Cardiologist: Lauree Chandler, MD  Electrophysiologist: None   Reason for Visit/CC: Post hospital follow-up after admission for intracranial hemorrhage  HPI:  Barry Woods is a 73 y.o. male who is being seen today for post hospital f/u after recent admission for ICH.   He is followed by Dr. Angelena Form and has a history of hypertension, HLD, alcohol abuse, atrial fibrillation, rheumatoid arthritis and coronary calcification on chest CT scan11/2017. Nuclear stress test 04/08/2017 was normal with no ischemia and normal LV function. Patient stopped drinking alcohol 03/2018 and had a seizure. 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 Per last office note, he started back drinking again.  He was also just recently diagnosed with atrial fibrillation in December 2019.  Eliquis was started for anticoagulation.  Repeat echo was done which showed normal left ventricular ejection fraction at 60 to 65% with moderate bilateral atrial enlargement.  He was seen back in clinic for follow-up 3 weeks after the initiation of Eliquis for consideration for direct direct-current cardioversion however was not unable to set up as the patient had missed a dose of Eliquis.  He was advised to keep his follow-up with Dr. Angelena Form on December 01, 2018.  Unfortunately, he ended up being admitted for stroke.  He presented November 27, 2018 with left-sided weakness slurred speech as well as a fall.  Cranial CT scan showed right brain hemorrhage. The patient did receive Andexxa to reverse anticoagulation.  Neurosurgery was consulted in regards to hemorrhage and recommendations were made for conservative care.  He had a follow-up MRI that showed no change in hematoma. He was later transitioned to CIR for physical therapy. Per neurology notes, he is no longer a long term candidate for  anticoagulation.   He presents to clinic today w/ his daughter. Ambulating w/ a walker.  He has made significant progress with physical therapy.  He has regained use of his left arm.  He is asymptomatic with his atrial fibrillation, denying palpitations, dyspnea, dizziness, lightheadedness, syncope/near syncope.  No chest pain.  Volume status has remained stable.  Has been checking his weight daily.  No lower extremity edema.  He reports that he has quit drinking again.  His daughter states that she emptied out all of his alcohol at home.  EKG today shows persistent atrial fibrillation with a controlled ventricular response at 64 bpm.  He is on atenolol for rate control.  Blood pressures well controlled at 116/68.  Oxygen saturations 97% on room air.   Cardiac Studies   Current Meds  Medication Sig  . acetaminophen (TYLENOL) 325 MG tablet Take 2 tablets (650 mg total) by mouth every 4 (four) hours as needed for mild pain (or temp > 37.5 C (99.5 F)).  . Adalimumab (HUMIRA) 40 MG/0.8ML PSKT Inject 40 mg into the skin See admin instructions. Every other week   . atenolol (TENORMIN) 25 MG tablet Take 3 tablets (75 mg total) by mouth daily.  . potassium chloride SA (K-DUR,KLOR-CON) 20 MEQ tablet Take 20 mEq by mouth daily.  . simvastatin (ZOCOR) 20 MG tablet Take 1 tablet (20 mg total) by mouth at bedtime.  . [DISCONTINUED] potassium chloride SA (K-DUR,KLOR-CON) 20 MEQ tablet Take 1 tablet (20 mEq total) by mouth 2 (two) times daily. (Patient taking differently: Take 20 mEq by mouth daily. )   Allergies  Allergen Reactions  . Atorvastatin Hives  .  Cetirizine Hcl Hives   Past Medical History:  Diagnosis Date  . Arthritis   . Cerebral cavernoma   . Hyperlipidemia   . Hypertension   . Rheumatoid arthritis ([place])    Family History  Problem Relation Age of Onset  . Colon cancer Mother 62       57  . Hepatitis C Father   . CAD Father    Past Surgical History:  Procedure Laterality Date  .  COLONOSCOPY    . LIPOMA EXCISION    . PALATE / UVULA BIOPSY / EXCISION    . TONSILLECTOMY     age 29   Social History   Socioeconomic History  . Marital status: Divorced    Spouse name: Not on file  . Number of children: Not on file  . Years of education: Not on file  . Highest education level: Not on file  Occupational History  . Occupation: Retired-[employer]  . Financial resource strain: Not on file  . Food insecurity:    Worry: Not on file    Inability: Not on file  . Transportation needs:    Medical: Not on file    Non-medical: Not on file  Tobacco Use  . Smoking status: Never Smoker  . Smokeless tobacco: Never Used  Substance and Sexual Activity  . Alcohol use: Yes    Alcohol/week: 14.0 standard drinks    Types: 14 Cans of beer per week  . Drug use: No  . Sexual activity: Not on file  Lifestyle  . Physical activity:    Days per week: Not on file    Minutes per session: Not on file  . Stress: Not on file  Relationships  . Social connections:    Talks on phone: Not on file    Gets together: Not on file    Attends religious service: Not on file    Active member of club or organization: Not on file    Attends meetings of clubs or organizations: Not on file    Relationship status: Not on file  . Intimate partner violence:    Fear of current or ex partner: Not on file    Emotionally abused: Not on file    Physically abused: Not on file    Forced sexual activity: Not on file  Other Topics Concern  . Not on file  Social History Narrative  . Not on file     Review of Systems: General: negative for chills, fever, night sweats or weight changes.  Cardiovascular: negative for chest pain, dyspnea on exertion, edema, orthopnea, palpitations, paroxysmal nocturnal dyspnea or shortness of breath Dermatological: negative for rash Respiratory: negative for cough or wheezing Urologic: negative for hematuria Abdominal: negative for nausea, vomiting,  diarrhea, bright red blood per rectum, melena, or hematemesis Neurologic: negative for visual changes, syncope, or dizziness All other systems reviewed and are otherwise negative except as noted above.   Physical Exam:  Blood pressure 116/68, pulse 64, height 5\' 7"  (1.702 m), weight 155 lb (70.3 kg), SpO2 97 %.  General appearance: alert, cooperative and no distress Neck: no carotid bruit and no JVD Lungs: clear to auscultation bilaterally Heart: irregularly irregular rhythm Extremities: extremities normal, atraumatic, no cyanosis or edema Pulses: 2+ and symmetric Skin: Skin color, texture, turgor normal. No rashes or lesions Neurologic: Grossly normal  EKG atrial fibrillation.  64 bpm.-- personally reviewed   ASSESSMENT AND PLAN:   1.  Persistent atrial fibrillation: EKG today shows persistent atrial fibrillation  with controlled ventricular response at 64 bpm.  He is on rate control therapy with atenolol.  He is no longer on anticoagulation due to recent intracranial hemorrhage.  Per hospital neurology notes, he is no longer a candidate for long-term anticoagulation.  Barry Woods he is asymptomatic with his atrial fibrillation and he is well rate controlled.  We discussed avoiding triggers that can increase heart rate.  2.  Hypertension: Well-controlled on beta-blocker.  116/68 in clinic today.   3.  Recent intracranial hemorrhage:  Admitted November 27, 2018 with left-sided weakness slurred speech as well as a fall.  Cranial CT scan showed right brain hemorrhage. This was in the setting of Eliquis for afib.  The patient did receive Andexxa to reverse anticoagulation.  Neurosurgery was consulted in regards to hemorrhage and recommendations were made for conservative care.  He had a follow-up MRI that showed no change in hematoma. He was later transitioned to CIR for physical therapy. Per neurology notes, he is no longer a long term candidate for anticoagulation. He has made significant progress  with physical therapy.  He has regained use of his left arm. He has f/u w/ outpatient neurology next month.   4.  Alcohol abuse: He reports that he has quit drinking again.  His daughter states that she emptied out all of his alcohol at home.  5. HLD: on statin therapy w/ zocor. He is concerned about his hepatic enzymes. He reports they were recently elevated. Will check HFTs today.   6. Medication Monitoring: He has been on [place] for hypokalemia. Recheck BMP today to ensure he is on the appropriate dose.  7. Diastolic HF: volume stable on exam. No dyspnea. He has been checking weight daily and has lasix at home to take PRN. He knows to take lasix if > 3 lb weight gain in 24 hrs.   Follow-Up w/ Dr. Angelena Form in 3 months.   Barry Woods, MHS [facility] 12/28/2018 9:16 AM

## 2018-12-28 NOTE — Patient Instructions (Signed)
Medication Instructions:  none If you need a refill on your cardiac medications before your next appointment, please call your pharmacy.   Lab work:TODAY BMET HFT If you have labs (blood work) drawn today and your tests are completely normal, you will receive your results only by: Marland Kitchen MyChart Message (if you have MyChart) OR . A paper copy in the mail If you have any lab test that is abnormal or we need to change your treatment, we will call you to review the results.  Testing/Procedures: NONE  Follow-Up: At [facility], you and your health needs are our priority.  As part of our continuing mission to provide you with exceptional heart care, we have created designated Provider Care Teams.  These Care Teams include your primary Cardiologist (physician) and Advanced Practice Providers (APPs -  Physician Assistants and Nurse Practitioners) who all work together to provide you with the care you need, when you need it. You will need a follow up appointment in 3 months.  Please call our office 2 months in advance to schedule this appointment.  You may see Lauree Chandler, MD or one of the following Advanced Practice Providers on your designated Care Team:   [place], PA-C Melina Copa, PA-C . Ermalinda Barrios, PA-C  Any Other Special Instructions Will Be Listed Below (If Applicable).

## 2018-12-29 ENCOUNTER — Telehealth: Payer: Self-pay

## 2018-12-29 DIAGNOSIS — J849 Interstitial pulmonary disease, unspecified: Secondary | ICD-10-CM | POA: Diagnosis not present

## 2018-12-29 DIAGNOSIS — I110 Hypertensive heart disease with heart failure: Secondary | ICD-10-CM | POA: Diagnosis not present

## 2018-12-29 DIAGNOSIS — I5030 Unspecified diastolic (congestive) heart failure: Secondary | ICD-10-CM | POA: Diagnosis not present

## 2018-12-29 DIAGNOSIS — I482 Chronic atrial fibrillation, unspecified: Secondary | ICD-10-CM | POA: Diagnosis not present

## 2018-12-29 DIAGNOSIS — I69115 Cognitive social or emotional deficit following nontraumatic intracerebral hemorrhage: Secondary | ICD-10-CM | POA: Diagnosis not present

## 2018-12-29 DIAGNOSIS — I69128 Other speech and language deficits following nontraumatic intracerebral hemorrhage: Secondary | ICD-10-CM | POA: Diagnosis not present

## 2018-12-29 DIAGNOSIS — I69154 Hemiplegia and hemiparesis following nontraumatic intracerebral hemorrhage affecting left non-dominant side: Secondary | ICD-10-CM | POA: Diagnosis not present

## 2018-12-29 DIAGNOSIS — M069 Rheumatoid arthritis, unspecified: Secondary | ICD-10-CM | POA: Diagnosis not present

## 2018-12-29 DIAGNOSIS — I69122 Dysarthria following nontraumatic intracerebral hemorrhage: Secondary | ICD-10-CM | POA: Diagnosis not present

## 2018-12-29 DIAGNOSIS — G250 Essential tremor: Secondary | ICD-10-CM | POA: Diagnosis not present

## 2018-12-29 NOTE — Telephone Encounter (Signed)
-----   Message from [place], [place] sent at 12/28/2018  5:17 PM EST ----- Liver enzymes are normal. Potassium levels also normal as well as renal function.

## 2018-12-29 NOTE — Telephone Encounter (Signed)
Notes recorded by Frederik Schmidt, RN on 12/29/2018 at 9:23 AM EST The patient has been notified of the result and verbalized understanding. All questions (if any) were answered. Frederik Schmidt, RN 12/29/2018 9:23 AM

## 2018-12-29 NOTE — Telephone Encounter (Signed)
-----   Message from [place], [place] sent at 12/28/2018  5:17 PM EST ----- Liver enzymes are normal. Potassium levels also normal as well as renal function.

## 2018-12-30 DIAGNOSIS — I69122 Dysarthria following nontraumatic intracerebral hemorrhage: Secondary | ICD-10-CM | POA: Diagnosis not present

## 2018-12-30 DIAGNOSIS — I69154 Hemiplegia and hemiparesis following nontraumatic intracerebral hemorrhage affecting left non-dominant side: Secondary | ICD-10-CM | POA: Diagnosis not present

## 2018-12-30 DIAGNOSIS — I69128 Other speech and language deficits following nontraumatic intracerebral hemorrhage: Secondary | ICD-10-CM | POA: Diagnosis not present

## 2018-12-30 DIAGNOSIS — I69115 Cognitive social or emotional deficit following nontraumatic intracerebral hemorrhage: Secondary | ICD-10-CM | POA: Diagnosis not present

## 2018-12-30 DIAGNOSIS — I482 Chronic atrial fibrillation, unspecified: Secondary | ICD-10-CM | POA: Diagnosis not present

## 2018-12-30 DIAGNOSIS — M069 Rheumatoid arthritis, unspecified: Secondary | ICD-10-CM | POA: Diagnosis not present

## 2018-12-30 DIAGNOSIS — I5030 Unspecified diastolic (congestive) heart failure: Secondary | ICD-10-CM | POA: Diagnosis not present

## 2018-12-30 DIAGNOSIS — G250 Essential tremor: Secondary | ICD-10-CM | POA: Diagnosis not present

## 2018-12-30 DIAGNOSIS — I110 Hypertensive heart disease with heart failure: Secondary | ICD-10-CM | POA: Diagnosis not present

## 2018-12-30 DIAGNOSIS — J849 Interstitial pulmonary disease, unspecified: Secondary | ICD-10-CM | POA: Diagnosis not present

## 2019-01-02 ENCOUNTER — Encounter: Payer: Medicare HMO | Attending: Physical Medicine & Rehabilitation | Admitting: Physical Medicine & Rehabilitation

## 2019-01-02 ENCOUNTER — Encounter: Payer: Self-pay | Admitting: Physical Medicine & Rehabilitation

## 2019-01-02 VITALS — BP 131/77 | HR 61 | Resp 14 | Ht 67.0 in | Wt 156.0 lb

## 2019-01-02 DIAGNOSIS — I10 Essential (primary) hypertension: Secondary | ICD-10-CM | POA: Insufficient documentation

## 2019-01-02 DIAGNOSIS — E785 Hyperlipidemia, unspecified: Secondary | ICD-10-CM | POA: Diagnosis not present

## 2019-01-02 DIAGNOSIS — Z800 Family history of malignant neoplasm of digestive organs: Secondary | ICD-10-CM | POA: Insufficient documentation

## 2019-01-02 DIAGNOSIS — I610 Nontraumatic intracerebral hemorrhage in hemisphere, subcortical: Secondary | ICD-10-CM | POA: Diagnosis not present

## 2019-01-02 DIAGNOSIS — Z8249 Family history of ischemic heart disease and other diseases of the circulatory system: Secondary | ICD-10-CM | POA: Insufficient documentation

## 2019-01-02 DIAGNOSIS — Z7952 Long term (current) use of systemic steroids: Secondary | ICD-10-CM | POA: Diagnosis not present

## 2019-01-02 DIAGNOSIS — R531 Weakness: Secondary | ICD-10-CM | POA: Diagnosis present

## 2019-01-02 DIAGNOSIS — I4891 Unspecified atrial fibrillation: Secondary | ICD-10-CM | POA: Diagnosis not present

## 2019-01-02 DIAGNOSIS — I482 Chronic atrial fibrillation, unspecified: Secondary | ICD-10-CM

## 2019-01-02 DIAGNOSIS — M069 Rheumatoid arthritis, unspecified: Secondary | ICD-10-CM | POA: Insufficient documentation

## 2019-01-02 NOTE — Progress Notes (Signed)
Subjective:    Patient ID: Barry Woods, male    DOB: [birth year], 73 y.o.   MRN: [id number]  HPI   Mr. Kinker is here in follow up of his right basal ganglia infarct/hemorrhage.  He states that he is doing quite well. He is independent at home with self-care and mobility. He's cooking simple meals. He is not using a device. HH PT, OT, SLP are coming out to the house. He is doing stairs. He has noticed improved sensation in the right arm and leg. Strength is improving also.   He denies any SOB or swelling.  His weight is remaining stable around 152 153 pounds.  He is weighing himself daily.  His activity tolerance seems to be improving.  Bowels and bladder emptying regularly.  Sleep is normal.  Appetite is good.  Mood is been upbeat.  He denies any alcohol consumption.  Apparently empty out his alcohol containers at home.  He is anxious to be home alone again.  He is going up and down stairs and can get to his bedroom.  He has follow-up with cardiology who recommends that he continues to hold any anticoagulation.  Pain Inventory Average Pain 0 Pain Right Now 0 My pain is no pain  In the last 24 hours, has pain interfered with the following? General activity 0 Relation with others 0 Enjoyment of life 0 What TIME of day is your pain at its worst? no pain Sleep (in general) Good  Pain is worse with: n/a Pain improves with: no pain Relief from Meds: no pain  Mobility walk with assistance use a walker ability to climb steps?  yes do you drive?  no Do you have any goals in this area?  yes  Function retired  Neuro/Psych tremor trouble walking  Prior Studies transitional care  Physicians involved in your care transitional care   Family History  Problem Relation Age of Onset  . Colon cancer Mother 72       57  . Hepatitis C Father   . CAD Father    Social History   Socioeconomic History  . Marital status: Divorced    Spouse name: Not on file  . Number of  children: Not on file  . Years of education: Not on file  . Highest education level: Not on file  Occupational History  . Occupation: Retired-[employer]  . Financial resource strain: Not on file  . Food insecurity:    Worry: Not on file    Inability: Not on file  . Transportation needs:    Medical: Not on file    Non-medical: Not on file  Tobacco Use  . Smoking status: Never Smoker  . Smokeless tobacco: Never Used  Substance and Sexual Activity  . Alcohol use: Yes    Alcohol/week: 14.0 standard drinks    Types: 14 Cans of beer per week  . Drug use: No  . Sexual activity: Not on file  Lifestyle  . Physical activity:    Days per week: Not on file    Minutes per session: Not on file  . Stress: Not on file  Relationships  . Social connections:    Talks on phone: Not on file    Gets together: Not on file    Attends religious service: Not on file    Active member of club or organization: Not on file    Attends meetings of clubs or organizations: Not on file    Relationship status:  Not on file  Other Topics Concern  . Not on file  Social History Narrative  . Not on file   Past Surgical History:  Procedure Laterality Date  . COLONOSCOPY    . LIPOMA EXCISION    . PALATE / UVULA BIOPSY / EXCISION    . TONSILLECTOMY     age 25   Past Medical History:  Diagnosis Date  . Arthritis   . Cerebral cavernoma   . Hyperlipidemia   . Hypertension   . Rheumatoid arthritis ([facility])    BP 131/77   Pulse 61   Resp 14   Ht 5\' 7"  (1.702 m)   Wt 156 lb (70.8 kg)   SpO2 97%   BMI 24.43 kg/m   Opioid Risk Score:   Fall Risk Score:  `1  Depression screen PHQ 2/9  Depression screen [facility] 2/9 06/15/2018 02/16/2017 02/16/2017 09/24/2015 09/20/2014  Decreased Interest 0 0 0 0 0  Down, Depressed, Hopeless 0 0 0 0 0  PHQ - 2 Score 0 0 0 0 0   2 Review of Systems  Constitutional: Negative.   HENT: Negative.   Eyes: Negative.   Respiratory: Negative.   Cardiovascular:  Negative.   Gastrointestinal: Negative.   Endocrine: Negative.   Genitourinary: Negative.   Musculoskeletal: Positive for gait problem.  Skin: Negative.   Allergic/Immunologic: Negative.   Neurological: Positive for seizures.  Hematological: Negative.   Psychiatric/Behavioral: Negative.   All other systems reviewed and are negative.      Objective:   Physical Exam   General: Alert and oriented x 3, No apparent distress HEENT: Head is normocephalic, atraumatic, PERRLA, EOMI, sclera anicteric, oral mucosa pink and moist, dentition intact, ext ear canals clear,  Neck: Supple without JVD or lymphadenopathy Heart: Irregularly irregular no murmurs rubs or gallops Chest: CTA bilaterally without wheezes, rales, or rhonchi; no distress Abdomen: Soft, non-tender, non-distended, bowel sounds positive. Extremities: No clubbing, cyanosis, or edema. Pulses are 2+ Skin: Clean and intact without signs of breakdown Neuro: Pt is cognitively appropriate with normal insight, memory, and awareness. Cranial nerves 2-12 are intact.  Improving dexterity of the left upper and left lower extremity.  Still slight pronator drift.  Can touch all fingers to thumb except for pinky.  Sensation 1+ out of 2 left arm and leg.  Gait was notable for occasional circumduction and skipping of the foot on the ground.  With cueing was able to lift his toes and strike with left heel.  Sometimes did not swing his left arm with gait.  Strength is 5 out of 5 right upper and right lower extremity.  Left upper extremity is 4 to 4+ out of 5 in left lower extremities 4 to 4+ out of 5 as well.  No tremor Musculoskeletal: Full ROM, No pain with AROM or PROM in the neck, trunk, or extremities. Posture appropriate Psych: Pt's affect is appropriate. 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  He is already independent with his own personal care and mobility.  -He may not drive  -Follow-up with neurology next month 2. Atrial fibrillation. ELIQUIS discontinued due to [place].  Cardiac rate controlled. Continue Tenormin 75 mg daily.               -prn lasix  -weights are balanced around 152lbs 3. Hyperlipidemia. Zocor 4. Rheumatoid arthritis. Patient on chronic prednisone as well as scheduled injections of Humira prior to admission. Humira Q Friday               5.  Hypertension             Continue atenolol              Good control at present.    30 minutes of direct patient care was provided today at this visit.  I will see him back in 2 months time.

## 2019-01-02 NOTE — Patient Instructions (Signed)
YOU MAY BE HOME ALONE IF SOMEONE NEEDS TO CHECK ON YOU INTERMITTENTLY  NO DRIVING

## 2019-01-05 NOTE — Telephone Encounter (Signed)
Called and spoke with the patient. He states that he was on lasix and potassium prn. Patient states that potassium is on his list to take QD since being d/c'd from the hospital but he is no longer taking lasix. Patient will not continue taking potassium and states that he will have PCP recheck his labs next week.

## 2019-01-06 DIAGNOSIS — J849 Interstitial pulmonary disease, unspecified: Secondary | ICD-10-CM | POA: Diagnosis not present

## 2019-01-06 DIAGNOSIS — G250 Essential tremor: Secondary | ICD-10-CM | POA: Diagnosis not present

## 2019-01-06 DIAGNOSIS — I5030 Unspecified diastolic (congestive) heart failure: Secondary | ICD-10-CM | POA: Diagnosis not present

## 2019-01-06 DIAGNOSIS — I69154 Hemiplegia and hemiparesis following nontraumatic intracerebral hemorrhage affecting left non-dominant side: Secondary | ICD-10-CM | POA: Diagnosis not present

## 2019-01-06 DIAGNOSIS — I482 Chronic atrial fibrillation, unspecified: Secondary | ICD-10-CM | POA: Diagnosis not present

## 2019-01-06 DIAGNOSIS — I69122 Dysarthria following nontraumatic intracerebral hemorrhage: Secondary | ICD-10-CM | POA: Diagnosis not present

## 2019-01-06 DIAGNOSIS — I110 Hypertensive heart disease with heart failure: Secondary | ICD-10-CM | POA: Diagnosis not present

## 2019-01-06 DIAGNOSIS — I69128 Other speech and language deficits following nontraumatic intracerebral hemorrhage: Secondary | ICD-10-CM | POA: Diagnosis not present

## 2019-01-06 DIAGNOSIS — I69115 Cognitive social or emotional deficit following nontraumatic intracerebral hemorrhage: Secondary | ICD-10-CM | POA: Diagnosis not present

## 2019-01-06 DIAGNOSIS — M069 Rheumatoid arthritis, unspecified: Secondary | ICD-10-CM | POA: Diagnosis not present

## 2019-01-10 ENCOUNTER — Ambulatory Visit (INDEPENDENT_AMBULATORY_CARE_PROVIDER_SITE_OTHER): Payer: Medicare HMO | Admitting: Adult Health

## 2019-01-10 ENCOUNTER — Encounter: Payer: Self-pay | Admitting: Adult Health

## 2019-01-10 VITALS — BP 110/78 | Temp 98.2°F | Wt 156.0 lb

## 2019-01-10 DIAGNOSIS — I619 Nontraumatic intracerebral hemorrhage, unspecified: Secondary | ICD-10-CM

## 2019-01-10 DIAGNOSIS — N179 Acute kidney failure, unspecified: Secondary | ICD-10-CM | POA: Diagnosis not present

## 2019-01-10 DIAGNOSIS — I10 Essential (primary) hypertension: Secondary | ICD-10-CM | POA: Diagnosis not present

## 2019-01-10 DIAGNOSIS — I5032 Chronic diastolic (congestive) heart failure: Secondary | ICD-10-CM

## 2019-01-10 DIAGNOSIS — I482 Chronic atrial fibrillation, unspecified: Secondary | ICD-10-CM | POA: Diagnosis not present

## 2019-01-10 DIAGNOSIS — R748 Abnormal levels of other serum enzymes: Secondary | ICD-10-CM | POA: Diagnosis not present

## 2019-01-10 LAB — HEPATIC FUNCTION PANEL
ALT: 11 U/L (ref 0–53)
AST: 17 U/L (ref 0–37)
Albumin: 3.7 g/dL (ref 3.5–5.2)
Alkaline Phosphatase: 47 U/L (ref 39–117)
Bilirubin, Direct: 0.4 mg/dL — ABNORMAL HIGH (ref 0.0–0.3)
Total Bilirubin: 1.9 mg/dL — ABNORMAL HIGH (ref 0.2–1.2)
Total Protein: 8.1 g/dL (ref 6.0–8.3)

## 2019-01-10 LAB — BASIC METABOLIC PANEL
BUN: 15 mg/dL (ref 6–23)
CO2: 29 mEq/L (ref 19–32)
Calcium: 9.1 mg/dL (ref 8.4–10.5)
Chloride: 100 mEq/L (ref 96–112)
Creatinine, Ser: 0.99 mg/dL (ref 0.40–1.50)
GFR: 74.64 mL/min (ref >60.00)
Glucose, Bld: 86 mg/dL (ref 70–99)
Potassium: 4 mEq/L (ref 3.5–5.1)
Sodium: 135 mEq/L (ref 135–145)

## 2019-01-10 NOTE — Progress Notes (Signed)
Subjective:    Patient ID: Barry Woods, male    DOB: [birth year], 73 y.o.   MRN: [id number]  HPI  73 year old male who  has a past medical history of Arthritis, Cerebral cavernoma, Hyperlipidemia, Hypertension, and Rheumatoid arthritis ([place]).  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  Presented on 11/27/2018 with left-sided weakness, slurred speech, as well as a fall.    Being discharged he has completed home PT and OT.  He reports that he is independent at home with self-care and mobility, he is cooking meals by himself.  He does not have to walk with a device.  He has started to notice improvement sensation in his right arm and leg strength is also improving.  He has been weighing himself daily and since being discharged has had to take Lasix 20 mg only once, yesterday due to weight gain greater than 3 pounds he reports after taking this he monitored his blood pressure and it was in the low 100s over 60s.  Reports that he felt fatigued for about 4 hours but then this resolved.  Has been seen by cardiology as well as physical medicine and rehabilitation since being discharged.  He is continued off anticoagulation and his atrial fibrillation is rate controlled with a beta-blocker  Denies any chest pain or shortness of breath  Rolitta he reports doing really well, is pleased with recover so far and has not started drinking again.  He would like to recheck his liver function panel due to history of elevated enzymes ( last was normal) and check kidney function after lasix use   Review of Systems  Constitutional: Negative.   Respiratory: Negative.   Cardiovascular: Negative.   Genitourinary: Negative.   Musculoskeletal: Positive for arthralgias and back pain.  Neurological: Positive for tremors and weakness.  Hematological: Negative.     Psychiatric/Behavioral: Negative.   All other systems reviewed and are negative.  Past Medical History:  Diagnosis Date  . Arthritis   . Cerebral cavernoma   . Hyperlipidemia   . Hypertension   . Rheumatoid arthritis ([place])     Social History   Socioeconomic History  . Marital status: Divorced    Spouse name: Not on file  . Number of children: Not on file  . Years of education: Not on file  . Highest education level: Not on file  Occupational History  . Occupation: Retired-[employer]  . Financial resource strain: Not on file  . Food insecurity:    Worry: Not on file    Inability: Not on file  . Transportation needs:    Medical: Not on file    Non-medical: Not on file  Tobacco Use  . Smoking status: Never Smoker  . Smokeless tobacco: Never Used  Substance and Sexual Activity  . Alcohol use: Yes    Alcohol/week: 14.0 standard drinks    Types: 14 Cans of beer per week  . Drug use: No  . Sexual activity: Not on file  Lifestyle  . Physical activity:    Days per week: Not on file    Minutes per session: Not on file  . Stress: Not on file  Relationships  . Social connections:    Talks on phone: Not on file    Gets together: Not on file    Attends religious service: Not on file    Active member  of club or organization: Not on file    Attends meetings of clubs or organizations: Not on file    Relationship status: Not on file  . Intimate partner violence:    Fear of current or ex partner: Not on file    Emotionally abused: Not on file    Physically abused: Not on file    Forced sexual activity: Not on file  Other Topics Concern  . Not on file  Social History Narrative  . Not on file    Past Surgical History:  Procedure Laterality Date  . COLONOSCOPY    . LIPOMA EXCISION    . PALATE / UVULA BIOPSY / EXCISION    . TONSILLECTOMY     age 64    Family History  Problem Relation Age of Onset  . Colon cancer Mother 67       57  . Hepatitis C  Father   . CAD Father     Allergies  Allergen Reactions  . Atorvastatin Hives  . Cetirizine Hcl Hives    Current Outpatient Medications on File Prior to Visit  Medication Sig Dispense Refill  . Adalimumab (HUMIRA) 40 MG/0.8ML PSKT Inject 40 mg into the skin See admin instructions. Every other week     . atenolol (TENORMIN) 25 MG tablet Take 3 tablets (75 mg total) by mouth daily. 90 tablet 1  . simvastatin (ZOCOR) 20 MG tablet Take 1 tablet (20 mg total) by mouth at bedtime. 100 tablet 4   No current facility-administered medications on file prior to visit.     BP (!) 98/58   Temp 98.2 F (36.8 C)   Wt 156 lb (70.8 kg)   BMI 24.43 kg/m       Objective:   Physical Exam Vitals signs and nursing note reviewed.  Constitutional:      Appearance: Normal appearance.  HENT:     Right Ear: Tympanic membrane, ear canal and external ear normal.     Left Ear: Tympanic membrane, ear canal and external ear normal.     Nose: Nose normal.     Mouth/Throat:     Mouth: Mucous membranes are moist.     Pharynx: Oropharynx is clear.  Eyes:     Extraocular Movements: Extraocular movements intact.     Pupils: Pupils are equal, round, and reactive to light.  Cardiovascular:     Rate and Rhythm: Normal rate. Rhythm irregularly irregular.     Pulses: Normal pulses.     Heart sounds: Normal heart sounds.  Pulmonary:     Effort: Pulmonary effort is normal.     Breath sounds: Normal breath sounds.  Abdominal:     General: Abdomen is flat.     Palpations: Abdomen is soft.  Musculoskeletal: Normal range of motion.  Skin:    General: Skin is warm and dry.     Capillary Refill: Capillary refill takes less than 2 seconds.  Neurological:     General: No focal deficit present.     Mental Status: He is alert and oriented to person, place, and time.     Cranial Nerves: Dysarthria present. No facial asymmetry.     Motor: Tremor (mild left sided resting tremor in UE) and pronator drift present.       Comments: Has slight pronator drift Decreased sensation on left arm and leg.  5/5 Strength right upper and lower extremity 4/5 strength in left upper and lower extremity      Psychiatric:  Mood and Affect: Mood normal.        Behavior: Behavior normal.        Thought Content: Thought content normal.        Judgment: Judgment normal.       Assessment & Plan:  1. Hemorrhagic stroke [facility]) - Has upcoming neurology appointment.  - He seems to be progressing well after CVA  - Continue to work on home exercises - No driving   2. Chronic atrial fibrillation - Follow up with cardiology  - Rate controlled today  - Basic Metabolic Panel - Hepatic function panel  3. Essential hypertension - at goal. Advised lasix can make his BP go down. If he has to take lasix, will have him trial 10 mg first - Basic Metabolic Panel - Hepatic function panel  4. Diastolic CHF, chronic (HCC) - Continue with daily weights, if weight > 3 pounds in a day, ok to take lasix.  - Basic Metabolic Panel - Hepatic function panel  5. Elevated liver enzymes - likely from alcohol use.  - Basic Metabolic Panel - Hepatic function panel  Dorothyann Peng, NP

## 2019-01-16 ENCOUNTER — Telehealth: Payer: Self-pay | Admitting: Cardiovascular Disease

## 2019-01-16 NOTE — Telephone Encounter (Signed)
Pt.notified

## 2019-01-16 NOTE — Telephone Encounter (Signed)
He should be ok to take prednisone. Barry Woods

## 2019-01-16 NOTE — Telephone Encounter (Signed)
New message   Pt c/o medication issue:  1. Name of Medication: pretizone  2. How are you currently taking this medication (dosage and times per day)? n/a  3. Are you having a reaction (difficulty breathing--STAT)? n/a  4. What is your medication issue?patient wants to know if he can take this medication for rheumatoid arthritis and if it will interact with heart medications?

## 2019-01-16 NOTE — Telephone Encounter (Signed)
Ok to use prednisone with current cardiac medications.

## 2019-01-16 NOTE — Telephone Encounter (Signed)
I spoke with Barry Woods and gave him information from pharmacist. Barry Woods would also like to know if Dr. Angelena Form thinks it is OK for him to take prednisone.

## 2019-01-18 ENCOUNTER — Encounter: Payer: Self-pay | Admitting: Physical Therapy

## 2019-01-18 ENCOUNTER — Ambulatory Visit: Payer: Medicare HMO | Attending: Physical Medicine & Rehabilitation | Admitting: Physical Therapy

## 2019-01-18 ENCOUNTER — Other Ambulatory Visit: Payer: Self-pay

## 2019-01-18 VITALS — BP 160/80 | HR 60

## 2019-01-18 DIAGNOSIS — I69851 Hemiplegia and hemiparesis following other cerebrovascular disease affecting right dominant side: Secondary | ICD-10-CM | POA: Diagnosis not present

## 2019-01-18 DIAGNOSIS — R278 Other lack of coordination: Secondary | ICD-10-CM | POA: Diagnosis not present

## 2019-01-18 DIAGNOSIS — R2689 Other abnormalities of gait and mobility: Secondary | ICD-10-CM | POA: Diagnosis not present

## 2019-01-18 NOTE — Patient Instructions (Signed)
Access Code: [email or web address]  URL: [email or web address]  Date: 01/18/2019  Prepared by: Eden Emms, DPT   Exercises  Standing Single Leg Stance with Unilateral Counter Support - 10 reps - 3 sets - 1x daily - 7x weekly  Single Leg Heel Raise with Counter Support - 10 reps - 3 sets - 1x daily - 7x weekly  Forward T with Counter Support - 10 reps - 3 sets - 1x daily - 7x weekly  Standing 4-Way Leg Reach with Unilateral Counter Support - 10 reps - 3 sets - 1x daily - 7x weekly  Walking Tandem Stance - 10 reps - 3 sets - 1x daily - 7x weekly  Backward Tandem Walking - 10 reps - 3 sets - 1x daily - 7x weekly  Seated Gaze Stabilization with Head Nod - 10 reps - 3 sets - 1x daily - 7x weekly

## 2019-01-18 NOTE — Therapy (Signed)
[facility] [address] [place], [place], [address] Phone: [phone]   Fax:  [phone]  Physical Therapy Evaluation  Patient Details  Name: Barry Woods MRN: [id number] Date of Birth: 1946/03/09 Referring Provider (PT): Marcello Moores    Encounter Date: 01/18/2019  PT End of Session - 01/18/19 1545    Visit Number  1    Number of Visits  4    Date for PT Re-Evaluation  02/21/19    PT Start Time  9417    PT Stop Time  1410    PT Time Calculation (min)  55 min    Activity Tolerance  Patient tolerated treatment well    Behavior During Therapy  [facility] for tasks assessed/performed       Past Medical History:  Diagnosis Date  . Arthritis   . Cerebral cavernoma   . Hyperlipidemia   . Hypertension   . Rheumatoid arthritis ([place])   . Stroke [facility])     Past Surgical History:  Procedure Laterality Date  . COLONOSCOPY    . LIPOMA EXCISION    . PALATE / UVULA BIOPSY / EXCISION    . TONSILLECTOMY     age 73    Vitals:   01/18/19 1343  BP: (!) 160/80  Pulse: 60     Subjective Assessment - 01/18/19 1527    Subjective  Pt stated he feels he is doing very good considering two strokes;  notes state basal ganglia CVA; he has tremors but has almost his whole life; his main deficit he deals with is RA flairs which is not a current problem and the fatigue from AFIB; he stated the medicaiton  Eliquis is what set him up for CVA; He wants to get back to golfing and being able to do what he wnats w/o the fatigue and see about his balance; he also needs/wants to get back to being able to drive;  he stated that if he isn't cleared to drive and he needs PT he wants to go to [place] location, which is near his home;   His CVA is listed R basal ganglia with dx of left hemiparesis/hemisensory loss from Dr Elissa Hefty .  He did state he went jogging ( about 100') yesterday just to see how hes doing;  he's not using any sort of assisted  device; stated that the last RA flair had him moving like an "invalid" ; he could hardly walk ;     Pertinent History  RA; basal ganglia cva; L hemiparesis- mild ; essential tremor;     How long can you sit comfortably?  no limit    How long can you stand comfortably?  15 min    How long can you walk comfortably?  10-15 min    Patient Stated Goals  be able to golf w/o fatigue; improve balance     Currently in Pain?  No/denies         [facility] PT Assessment - 01/18/19 0001      Assessment   Medical Diagnosis  left hemiparesis; left hemisenory loss    Referring Provider (PT)  Shwartz       Precautions   Precautions  Fall    Precaution Comments  AFIB      Balance Screen   Has the patient fallen in the past 6 months  Yes   when he had cva fell broke sheet rock with his head   How many times?  1    Has  the patient had a decrease in activity level because of a fear of falling?   Yes      Prior Function   Level of Independence  Independent      Cognition   Overall Cognitive Status  Within Functional Limits for tasks assessed      Coordination   Gross Motor Movements are Fluid and Coordinated  Yes    Fine Motor Movements are Fluid and Coordinated  Yes   hand /thumb limited due to hx of RA     Posture/Postural Control   Posture/Postural Control  No significant limitations      Tone   Assessment Location  Right Upper Extremity;Left Upper Extremity;Left Lower Extremity;Right Lower Extremity      Transfers   Transfers  Sit to Stand    Sit to Stand  7: Independent    Five time sit to stand comments   8 seconds      Ambulation/Gait   Ambulation/Gait  Yes    Ambulation/Gait Assistance  7: Independent    Ambulation Distance (Feet)  250 Feet    Assistive device  None    Gait Pattern  Step-through pattern;Wide base of support;Decreased arm swing - right;Decreased arm swing - left;Decreased stride length;Decreased trunk rotation    Gait velocity  3 ft/sec      Balance   Balance  Assessed  Yes    Balance comment  --   see MINI BEST TEST and MODCTSIB     RUE Tone   RUE Tone  Within Functional Limits      LUE Tone   LUE Tone  Within Functional Limits      RLE Tone   RLE Tone  Within Functional Limits      LLE Tone   LLE Tone  Within Functional Limits                Objective measurements completed on examination: See above findings.     MOD CTSIB  30,30,30 3   Failed standing on foam eyes closed   5 x sit to stand  8 seconds      1. SIT TO STAND Instruction: "Cross your arms across your chest. Try not to use your hands unless you must. Do not let your legs lean against the back of the chair when you stand. Please stand up now." (2) Normal: Comes to stand without use of hands and stabilizes independently. (1) Moderate: Comes to stand WITH use of hands on first attempt. (0) Severe: Unable to stand up from chair without assistance, OR needs several attempts with use of hands. 2. RISE TO TOES Instruction: "Place your feet shoulder width apart. Place your hands on your hips. Try to rise as high as you can onto your toes. I will count out loud to 3 seconds. Try to hold this pose for at least 3 seconds. Look straight ahead. Rise now." (2) Normal: Stable for 3 s with maximum height. (1) Moderate: Heels up, but not full range (smaller than when holding hands), OR noticeable instability for 3 s. (0) Severe: < 3 s. 3. STAND ON ONE LEG Instruction: "Look straight ahead. Keep your hands on your hips. Lift your leg off of the ground behind you without touching or resting your raised leg upon your other standing leg. Stay standing on one leg as long as you can. Look straight ahead. Lift now." Left: Time in Seconds Trial 1:___3__Trial 2:__3___ (2) Normal: 20 s. (1) Moderate: < 20 s. (0) Severe: Unable.  Right: Time in Seconds Trial 1:___3__Trial 2:__3___ (2) Normal: 20 s. (1) Moderate: < 20 s. (0) Severe: Unable To score each side separately use the  trial with the longest time. To calculate the sub-score and total score use the side [left or right] with the lowest numerical score [i.e. the worse side]. _________________________________________________________________________________________________ 4. COMPENSATORY STEPPING CORRECTION- FORWARD Instruction: "Stand with your feet shoulder width apart, arms at your sides. Lean forward against my hands beyond your forward limits. When I let go, do whatever is necessary, including taking a step, to avoid a fall." (2) Normal: Recovers independently with a single, large step (second realignment step is allowed). (1) Moderate: More than one step used to recover equilibrium. (0) Severe: No step, OR would fall if not caught, OR falls spontaneously. 5. COMPENSATORY STEPPING CORRECTION- BACKWARD Instruction: "Stand with your feet shoulder width apart, arms at your sides. Lean backward against my hands beyond your backward limits. When I let go, do whatever is necessary, including taking a step, to avoid a fall." (2) Normal: Recovers independently with a single, large step. (1) Moderate: More than one step used to recover equilibrium. (0) Severe: No step, OR would fall if not caught, OR falls spontaneously. 6. COMPENSATORY STEPPING CORRECTION- LATERAL Instruction: "Stand with your feet together, arms down at your sides. Lean into my hand beyond your sideways limit. When I let go, do whatever is necessary, including taking a step, to avoid a fall." Left (2) Normal: Recovers independently with 1 step (crossover or lateral OK). (1) Moderate: Several steps to recover equilibrium. (0) Severe: Falls, or cannot step. Right (2) Normal: Recovers independently with 1 step (crossover or lateral OK). (1) Moderate: Several steps to recover equilibrium. (0) Severe: Falls, or cannot step. Use the side with the lowest score to calculate sub-score and total  score. _________________________________________________________________________________________________ 7. STANCE (FEET TOGETHER); EYES OPEN, FIRM SURFACE Instruction: "Place your hands on your hips. Place your feet together until almost touching. Look straight ahead. Be as stable and still as possible, until I Woods stop." Time in seconds:__30______ (2) Normal: 30 s. (1) Moderate: < 30 s. (0) Severe: Unable. REACTIVE POSTURAL CONTROL   8. STANCE (FEET TOGETHER); EYES CLOSED, FOAM SURFACE Instruction: "Step onto the foam. Place your hands on your hips. Place your feet together until almost touching. Be as stable and still as possible, until I Woods stop. I will start timing when you close your eyes." Time in seconds:___3_sec____ (2) Normal: 30 s. (1) Moderate: < 30 s. (0) Severe: Unable. 9. INCLINE- EYES CLOSED Instruction: "Step onto the incline ramp. Please stand on the incline ramp with your toes toward the top. Place your feet shoulder width apart and have your arms down at your sides. I will start timing when you close your eyes." Time in seconds:____30____ (2) Normal: Stands independently 30 s and aligns with gravity. (1) Moderate: Stands independently <30 s OR aligns with surface. (0) Severe: Unable. ____________________________________________________________________________________________________ 10. CHANGE IN GAIT SPEED Instruction: "Begin walking at your normal speed, when I tell you 'fast', walk as fast as you can. When I Woods 'slow', walk very slowly." (2) Normal: Significantly changes walking speed without imbalance. (1) Moderate: Unable to change walking speed or signs of imbalance. (0) Severe: Unable to achieve significant change in walking speed AND signs of imbalance. [place] - HORIZONTAL Instruction: "Begin walking at your normal speed, when I Woods "right", turn your head and look to the right. When I Woods "left" turn your head and look  to the left.  Try to keep yourself walking in a straight line." (2) Normal: performs head turns with no change in gait speed and good balance. (1) Moderate: performs head turns with reduction in gait speed. (0) Severe: performs head turns with imbalance. 12. WALK WITH PIVOT TURNS Instruction: "Begin walking at your normal speed. When I tell you to 'turn and stop', turn as quickly as you can, face the opposite direction, and stop. After the turn, your feet should be close together." (2) Normal: Turns with feet close FAST (< 3 steps) with good balance. (1) Moderate: Turns with feet close SLOW (>4 steps) with good balance. (0) Severe: Cannot turn with feet close at any speed without imbalance. 13. STEP OVER OBSTACLES Instruction: "Begin walking at your normal speed. When you get to the box, step over it, not around it and keep walking." (2) Normal: Able to step over box with minimal change of gait speed and with good balance. (1) Moderate: Steps over box but touches box OR displays cautious behavior by slowing gait. (0) Severe: Unable to step over box OR steps around box. 14. TIMED UP & GO WITH DUAL TASK [3 METER WALK] Instruction TUG: "When I Woods 'Go', stand up from chair, walk at your normal speed across the tape on the floor, turn around, and come back to sit in the chair." Instruction TUG with Dual Task: "Count backwards by threes starting at ___. When I Woods 'Go', stand up from chair, walk at your normal speed across the tape on the floor, turn around, and come back to sit in the chair. Continue counting backwards the entire time." TUG: ____6___seconds; Dual Task TUG: ___8.7_____seconds (2) Normal: No noticeable change in sitting, standing or walking while backward counting when compared to TUG without Dual Task. (1) Moderate: Dual Task affects either counting OR walking (>10%) when compared to the TUG without Dual Task. (0) Severe: Stops counting while walking OR stops walking while counting. When  scoring item 14, if subject's gait speed slows more than 10% between the TUG without and with a Dual Task the score should be decreased by a point.   TOTAL SCORE: __23______        PT Education - 01/18/19 1543    Education Details  Discussed s/p CVA and brain plasticity and how all activity should be considered 'rehab' ; discussed how the PT will target areas of deficits and discussed findings of poor vestibluar responses today but also that he has a lot of intact ability and his prognosis is good    Person(s) Educated  Patient    Methods  Explanation    Comprehension  Verbalized understanding          PT Long Term Goals - 01/18/19 1556      PT LONG TERM GOAL #1   Title  Pt will be independent in comprehensive HEP for dynamic balance improvement and activity tolerance     Baseline  no HEP established    Time  4    Period  Weeks    Status  New      PT LONG TERM GOAL #2   Title  Pt will be able to tolerate 30 min of moderate activity rating RPE <4/10 so he can return to golfing/recreational activity     Baseline  fatigue easily     Time  4    Period  Weeks      PT LONG TERM GOAL #3   Title  Pt will demonstrate a  low fall risk on Mini best balance assessment  28/28 and be albe to demo no fall risk on varied terrain/outdoors for safe community ambulation     Baseline  24    Time  4    Period  Weeks    Status  New             Plan - 01/18/19 1546    Clinical Impression Statement  Looking at [facility] and the progress hes already made and his high motivation his prognosis is excellent.  He needs to work on high level balance ex's and considering the AFIB , his overall activity tolerance;  also he will benefit from joint stabilzaition ex's and proper ROM /flexiblity exs for RA;  his main deficit found today was poor abliltiy to maintain balance in single leg stance activty and when vision and somatosensory was challenged; indicates defict in vestibular function; he does have a  noticable resting tremor but this has been present his whole life apparently      History and Personal Factors relevant to plan of care:  recent cva; RA ,Afib    Clinical Presentation  Stable    Clinical Presentation due to:  s/p cva; balance deficit     Clinical Decision Making  Low    Rehab Potential  Excellent    PT Frequency  1x / week    PT Duration  4 weeks    PT Treatment/Interventions  Energy conservation;Vestibular;Patient/family education;Therapeutic exercise;Therapeutic activities;Stair training;Gait training    PT Next Visit Plan  review HEP; advance as needed    PT Home Exercise Plan   Access Code: [email or web address]        Patient will benefit from skilled therapeutic intervention in order to improve the following deficits and impairments:  Abnormal gait, Decreased coordination, Decreased endurance, Decreased activity tolerance, Difficulty walking, Decreased balance  Visit Diagnosis: Hemiplegia and hemiparesis following other cerebrovascular disease affecting right dominant side (HCC)  Other abnormalities of gait and mobility  Other lack of coordination     Problem List Patient Active Problem List   Diagnosis Date Noted  . Hemorrhagic stroke ([place])   . Hyponatremia   . Dyslipidemia   . AKI (acute kidney injury) ([place])   . Cerebral cavernoma   . ICH (intracerebral hemorrhage) ([place]) 11/27/2018  . Diastolic CHF, chronic ([place]) 11/09/2018  . Atrial fibrillation ([place]) 11/07/2018  . Coronary artery calcification seen on CT scan 10/31/2018  . Routine general medical examination at a health care facility 03/09/2018  . Benign essential tremor 02/16/2017  . Pure hypercholesterolemia 02/16/2017  . ILD (interstitial lung disease) ([place]) 10/21/2016  . BPH associated with nocturia 09/24/2015  . Rheumatoid arthritis ([place]) 09/28/2011  . Gout attack 02/02/2011  . 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 Hyperlipidemia, mild 11/01/2007  . Essential hypertension 11/01/2007    Rosaura Carpenter D  PT DPT  01/18/2019, 4:02 PM  [facility] [address] [place], [place], [address] Phone: [phone]   Fax:  [phone]  Name: Barry Woods MRN: [id number] Date of Birth: [birth year]

## 2019-01-31 ENCOUNTER — Encounter: Payer: Self-pay | Admitting: Neurology

## 2019-01-31 ENCOUNTER — Ambulatory Visit: Payer: Medicare HMO | Admitting: Neurology

## 2019-01-31 VITALS — BP 137/68 | HR 76 | Ht 67.0 in | Wt 158.8 lb

## 2019-01-31 DIAGNOSIS — I610 Nontraumatic intracerebral hemorrhage in hemisphere, subcortical: Secondary | ICD-10-CM

## 2019-01-31 MED ORDER — ASPIRIN EC 81 MG PO TBEC: 81.0000 mg | DELAYED_RELEASE_TABLET | Freq: Every day | ORAL | 1 refills | Status: DC

## 2019-01-31 NOTE — Progress Notes (Signed)
[facility] [address] [place]. [place] [address] [phone]       OFFICE FOLLOW-UP NOTE  [name] Date of Birth:  04/18/1946 Medical Record Number:  [id number]   HPI: 73 year old Caucasian male seen today for initial office follow-up visit following hospital admission for intracerebral hemorrhage in January 2020.  He is accompanied by his brother-in-law.  History is obtained from them and review of hospital electronic medical records.  I personally reviewed imaging films in PACS. Barry Woods is a 73 year old male with past medical history of chronic atrial fibrillation on long-term anticoagulation with Eliquis, hyperlipidemia, hypertension, rheumatoid arthritis who presented on 11/27/2018 with sudden onset of gait difficulty with his legs dragging to the left.  He was unable to walk and EMS were called.  He had left hemiplegia with facial droop and slurred speech.  NIH stroke scale on admission was 7 code stroke was activated and on initial evaluation there was concern for LVO but CT scan of the head showed right basal ganglia hemorrhage with effacement of right lateral ventricle due to mass-effect.  The patient was on Eliquis at home and this was reversed with Andexxa.  Patient actually had scheduled planned cardioversion the next week for his A. fib.  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  Follow-up CT scan showed slight increase in size of the hemorrhage up to 21 cubic cc volume from previous 17 cubic cc volume with 2 mm right left shift.  Patient was admitted to the intensive care unit where blood pressure was tightly controlled.  MRI scan of the brain subsequently confirmed right basal ganglia hemorrhage with 2 to 3 mm right-to-left midline shift but no underlying tumor or high flow vascular malformation is noted.  There were few foci of hemosiderin noted elsewhere consistent with old prior hemorrhage.  There was also incidental small 4 mm left medial frontal  subcortical white matter acute infarct.  Patient had prior history of brain cavernoma's for which he was followed by Dr. Kathyrn Sheriff neurosurgeon.  Patient neurological exam remained stable.  He had mild left hemiparesis 4/5 strength with weakness of left grip and intrinsic hand muscles.  He was seen by physical occupational and speech therapy.  He was initially started on dysphagia 3 diet and subsequently progressed.  He was transferred to inpatient rehab where he did very well and has not been discharged home.  He states he has obtained substantial improvement in his left-sided strength.  He is only slightly diminished fine motor skills in his left hand.  His left leg is little bit heavy and drags at times but he can walk independently without assistance.  He is living alone.  He denies any cognitive or memory difficulties.  He is currently on no antiplatelet agents or anticoagulants. ROS:   14 system review of systems is positive for leg weakness, difficulty walking and all other systems negative  PMH:  Past Medical History:  Diagnosis Date  . Arthritis   . Cerebral cavernoma   . Hyperlipidemia   . Hypertension   . Rheumatoid arthritis ([place])   . Stroke [facility])     Social History:  Social History   Socioeconomic History  . Marital status: Divorced    Spouse name: Not on file  . Number of children: Not on file  . Years of education: Not on file  . Highest education level: Not on file  Occupational History  . Occupation: Retired-[employer]  . Financial resource strain: Not on  file  . Food insecurity:    Worry: Not on file    Inability: Not on file  . Transportation needs:    Medical: Not on file    Non-medical: Not on file  Tobacco Use  . Smoking status: Never Smoker  . Smokeless tobacco: Never Used  Substance and Sexual Activity  . Alcohol use: Not Currently    Alcohol/week: 14.0 standard drinks    Types: 14 Cans of beer per week  . Drug use: No  . Sexual  activity: Not on file  Lifestyle  . Physical activity:    Days per week: Not on file    Minutes per session: Not on file  . Stress: Not on file  Relationships  . Social connections:    Talks on phone: Not on file    Gets together: Not on file    Attends religious service: Not on file    Active member of club or organization: Not on file    Attends meetings of clubs or organizations: Not on file    Relationship status: Not on file  . Intimate partner violence:    Fear of current or ex partner: Not on file    Emotionally abused: Not on file    Physically abused: Not on file    Forced sexual activity: Not on file  Other Topics Concern  . Not on file  Social History Narrative  . Not on file    Medications:   Current Outpatient Medications on File Prior to Visit  Medication Sig Dispense Refill  . Adalimumab (HUMIRA) 40 MG/0.8ML PSKT Inject 40 mg into the skin See admin instructions. Every other week     . atenolol (TENORMIN) 25 MG tablet Take 3 tablets (75 mg total) by mouth daily. 90 tablet 1  . simvastatin (ZOCOR) 20 MG tablet Take 1 tablet (20 mg total) by mouth at bedtime. 100 tablet 4   No current facility-administered medications on file prior to visit.     Allergies:   Allergies  Allergen Reactions  . Atorvastatin Hives  . Cetirizine Hcl Hives    Physical Exam General: well developed, well nourished , seated, in no evident distress Head: head normocephalic and atraumatic.  Neck: supple with no carotid or supraclavicular bruits Cardiovascular: regular rate and rhythm, no murmurs Musculoskeletal: no deformity Skin:  no rash/petichiae Vascular:  Normal pulses all extremities Vitals:   01/31/19 1522  BP: 137/68  Pulse: 76   Neurologic Exam Mental Status: Awake and fully alert. Oriented to place and time. Recent and remote memory intact. Attention span, concentration and fund of knowledge appropriate. Mood and affect appropriate.  Cranial Nerves: Fundoscopic exam  reveals sharp disc margins. Pupils equal, briskly reactive to light. Extraocular movements full without nystagmus. Visual fields full to confrontation. Hearing intact. Facial sensation intact. Face, tongue, palate moves normally and symmetrically.  Motor: Normal bulk and tone. Normal strength in all tested extremity muscles except mild weakness of left grip, intrinsic hand muscles, hip flexors and ankle dorsiflexors.  Tone is increased in the left leg compared to the right..  He orbits right over left upper extremity.  Fine finger movements are diminished on the left. Sensory.: intact to touch ,pinprick .position and vibratory sensation.  Coordination: Rapid alternating movements normal in all extremities. Finger-to-nose and heel-to-shin performed accurately bilaterally. Gait and Station: Arises from chair without difficulty. Stance is normal. Gait demonstrates normal stride length and slight dragging of the left leg.. Able to heel, toe and tandem walk with  moderate difficulty.  Reflexes: 1+ and symmetric. Toes downgoing.   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  He is doing remarkably well with minimal physical deficits.     PLAN: I had a long discussion with the patient and his brother-in-law regarding his recent basal ganglia hemorrhage related to Eliquis as well as small left frontal white matter lacunar infarct and discussed stroke risk with atrial fibrillation and risk reduction with antiplatelet therapy and anticoagulation.  I recommend he start aspirin 81 mg daily and continue strict control of hypertension with blood pressure goal below 130/90 and lipids with LDL cholesterol goal below 70 mg percent and hemoglobin A1c goal below 6.5%.Marland Kitchen  He was advised to walk slowly and avoid sudden  and quick movements and falls.  He was advised to eat a healthy diet and not to gain weight and to be active and exercise regularly.  He may also consider possible participation in the ASPIRE stroke prevention trial if interested.  He will return for follow-up in the future in 3 months with my nurse practitioner Janett Billow or call earlier if necessary Greater than 50% of time during this 25 minute visit was spent on counseling,explanation of diagnosis of intracerebral hemorrhage, planning of further management, discussion with patient and family and coordination of care Antony Contras, MD  [facility] [address] [place], [place] [address]  Phone [phone] Fax [phone] Note: This document was prepared with digital dictation and possible smart phrase technology. Any transcriptional errors that result from this process are unintentional

## 2019-01-31 NOTE — Patient Instructions (Addendum)
I had a long discussion with the patient and his brother-in-law regarding his recent basal ganglia hemorrhage related to Eliquis as well as small left frontal white matter lacunar infarct and discussed stroke risk with atrial fibrillation and risk reduction with antiplatelet therapy and anticoagulation.  I recommend he start aspirin 81 mg daily and continue strict control of hypertension with blood pressure goal below 130/90 and lipids with LDL cholesterol goal below 70 mg percent and hemoglobin A1c goal below 6.5%.Marland Kitchen  He was advised to walk slowly and avoid sudden and quick movements and falls.  He was advised to eat a healthy diet and not to gain weight and to be active and exercise regularly.  He may also consider possible participation in the ASPIRE stroke prevention trial if interested.  He will return for follow-up in the future in 3 months with my nurse practitioner Janett Billow or call earlier if necessary  Stroke Prevention Some medical conditions and behaviors are associated with a higher chance of having a stroke. You can help prevent a stroke by making nutrition, lifestyle, and other changes, including managing any medical conditions you may have. What nutrition changes can be made?   Eat healthy foods. You can do this by: ? Choosing foods high in fiber, such as fresh fruits and vegetables and whole grains. ? Eating at least 5 or more servings of fruits and vegetables a day. Try to fill half of your plate at each meal with fruits and vegetables. ? Choosing lean protein foods, such as lean cuts of meat, poultry without skin, fish, tofu, beans, and nuts. ? Eating low-fat dairy products. ? Avoiding foods that are high in salt (sodium). This can help lower blood pressure. ? Avoiding foods that have saturated fat, trans fat, and cholesterol. This can help prevent high cholesterol. ? Avoiding processed and premade foods.  Follow your health care provider's specific guidelines for losing weight,  controlling high blood pressure (hypertension), lowering high cholesterol, and managing diabetes. These may include: ? Reducing your daily calorie intake. ? Limiting your daily sodium intake to 1,500 milligrams (mg). ? Using only healthy fats for cooking, such as olive oil, canola oil, or sunflower oil. ? Counting your daily carbohydrate intake. What lifestyle changes can be made?  Maintain a healthy weight. Talk to your health care provider about your ideal weight.  Get at least 30 minutes of moderate physical activity at least 5 days a week. Moderate activity includes brisk walking, biking, and swimming.  Do not use any products that contain nicotine or tobacco, such as cigarettes and e-cigarettes. If you need help quitting, ask your health care provider. It may also be helpful to avoid exposure to secondhand smoke.  Limit alcohol intake to no more than 1 drink a day for nonpregnant women and 2 drinks a day for men. One drink equals 12 oz of beer, 5 oz of wine, or 1 oz of hard liquor.  Stop any illegal drug use.  Avoid taking birth control pills. Talk to your health care provider about the risks of taking birth control pills if: ? You are over 13 years old. ? You smoke. ? You get migraines. ? You have ever had a blood clot. What other changes can be made?  Manage your cholesterol levels. ? Eating a healthy diet is important for preventing high cholesterol. If cholesterol cannot be managed through diet alone, you may also need to take medicines. ? Take any prescribed medicines to control your cholesterol as told by your health  care provider.  Manage your diabetes. ? Eating a healthy diet and exercising regularly are important parts of managing your blood sugar. If your blood sugar cannot be managed through diet and exercise, you may need to take medicines. ? Take any prescribed medicines to control your diabetes as told by your health care provider.  Control your hypertension. ? To  reduce your risk of stroke, try to keep your blood pressure below 130/80. ? Eating a healthy diet and exercising regularly are an important part of controlling your blood pressure. If your blood pressure cannot be managed through diet and exercise, you may need to take medicines. ? Take any prescribed medicines to control hypertension as told by your health care provider. ? Ask your health care provider if you should monitor your blood pressure at home. ? Have your blood pressure checked every year, even if your blood pressure is normal. Blood pressure increases with age and some medical conditions.  Get evaluated for sleep disorders (sleep apnea). Talk to your health care provider about getting a sleep evaluation if you snore a lot or have excessive sleepiness.  Take over-the-counter and prescription medicines only as told by your health care provider. Aspirin or blood thinners (antiplatelets or anticoagulants) may be recommended to reduce your risk of forming blood clots that can lead to stroke.  Make sure that any other medical conditions you have, such as atrial fibrillation or atherosclerosis, are managed. What are the warning signs of a stroke? The warning signs of a stroke can be easily remembered as BEFAST.  B is for balance. Signs include: ? Dizziness. ? Loss of balance or coordination. ? Sudden trouble walking.  E is for eyes. Signs include: ? A sudden change in vision. ? Trouble seeing.  F is for face. Signs include: ? Sudden weakness or numbness of the face. ? The face or eyelid drooping to one side.  A is for arms. Signs include: ? Sudden weakness or numbness of the arm, usually on one side of the body.  S is for speech. Signs include: ? Trouble speaking (aphasia). ? Trouble understanding.  T is for time. ? These symptoms may represent a serious problem that is an emergency. Do not wait to see if the symptoms will go away. Get medical help right away. Call your local  emergency services (911 in the [place] Do not drive yourself to the hospital.  Other signs of stroke may include: ? A sudden, severe headache with no known cause. ? Nausea or vomiting. ? Seizure. Where to find more information For more information, visit:  American Stroke Association: www.strokeassociation.org  National Stroke Association: www.stroke.org Summary  You can prevent a stroke by eating healthy, exercising, not smoking, limiting alcohol intake, and managing any medical conditions you may have.  Do not use any products that contain nicotine or tobacco, such as cigarettes and e-cigarettes. If you need help quitting, ask your health care provider. It may also be helpful to avoid exposure to secondhand smoke.  Remember BEFAST for warning signs of stroke. Get help right away if you or a loved one has any of these signs. This information is not intended to replace advice given to you by your health care provider. Make sure you discuss any questions you have with your health care provider. Document Released: 12/17/2004 Document Revised: 12/15/2016 Document Reviewed: 12/15/2016 Elsevier Interactive Patient Education  2019 [employer].

## 2019-02-01 ENCOUNTER — Encounter: Payer: Self-pay | Admitting: Adult Health

## 2019-02-07 DIAGNOSIS — R945 Abnormal results of liver function studies: Secondary | ICD-10-CM | POA: Diagnosis not present

## 2019-02-07 DIAGNOSIS — Z6823 Body mass index (BMI) 23.0-23.9, adult: Secondary | ICD-10-CM | POA: Diagnosis not present

## 2019-02-07 DIAGNOSIS — M0589 Other rheumatoid arthritis with rheumatoid factor of multiple sites: Secondary | ICD-10-CM | POA: Diagnosis not present

## 2019-02-08 ENCOUNTER — Ambulatory Visit: Payer: Medicare HMO | Admitting: Physical Therapy

## 2019-02-20 ENCOUNTER — Telehealth: Payer: Self-pay | Admitting: Physician Assistant

## 2019-02-20 NOTE — Telephone Encounter (Signed)
I called patient about his upcoming appointment with me 02/28/2019 because of the coronavirus.  Patient had many questions surrounding his atrial fibrillation and recent hemorrhagic stroke on Eliquis.  He was talking about being enrolled in a clinical trial through neurology that would place him back on Eliquis vs ASA but he won't know what he's taking.  Patient says he gets short of breath with exertion most times but on occasion is not short of breath.  He is wondering if he is going in and out of atrial fibrillation.  He wants to know his options but at this time with a recent hemorrhagic stroke and not on blood thinners we cannot plan to cardiovert him.  He would  like to reschedule his office visit with Dr. Angelena Form in July.  Could also consider appointment with A. fib clinic but will have him see [place] in the office first. Declined Webex or telephone visit.

## 2019-02-21 NOTE — Telephone Encounter (Signed)
[place], please reschedule patient with Dr. Angelena Form in July. Thanks

## 2019-02-23 NOTE — Progress Notes (Deleted)
{Choose 1 Note Type (Telehealth Visit or Telephone Visit):[phone]}  Evaluation Performed:  Follow-up visit  This visit type was conducted due to national recommendations for restrictions regarding the COVID-19 Pandemic (e.g. social distancing).  This format is felt to be most appropriate for this patient at this time.  All issues noted in this document were discussed and addressed.  No physical exam was performed (except for noted visual exam findings with Video Visits).  Please refer to the patient's chart (MyChart message for video visits and phone note for telephone visits) for the patient's consent to telehealth for [facility].  Date:  02/23/2019   ID:  Barry Woods, DOB Mar 17, 1946, MRN [id number]  Patient Location:  ***  Provider location:   ***  PCP:  Dorothyann Peng, NP  Cardiologist:  Lauree Chandler, MD *** Electrophysiologist:  None   Chief Complaint:  ***  History of Present Illness:    Barry Woods is a 73 y.o. male who presents via audio/video conferencing for a telehealth visit today.  ***  The patient {does/does not:200015} have symptoms concerning for COVID-19 infection (fever, chills, cough, or new shortness of breath).    Prior CV studies:   The following studies were reviewed today:  ***  Past Medical History:  Diagnosis Date  . Arthritis   . Cerebral cavernoma   . Hyperlipidemia   . Hypertension   . Rheumatoid arthritis ([place])   . Stroke [facility])    Past Surgical History:  Procedure Laterality Date  . COLONOSCOPY    . LIPOMA EXCISION    . PALATE / UVULA BIOPSY / EXCISION    . TONSILLECTOMY     age 73     No outpatient medications have been marked as taking for the 02/28/19 encounter (Appointment) with Imogene Burn, PA-C.     Allergies:   Atorvastatin and Cetirizine hcl   Social History   Tobacco Use  . Smoking status: Never Smoker  . Smokeless tobacco: Never Used  Substance Use Topics  . Alcohol use: Not Currently   Alcohol/week: 14.0 standard drinks    Types: 14 Cans of beer per week  . Drug use: No     Family Hx: The patient's family history includes CAD in his father; Colon cancer (age of onset: 53) in his mother; Hepatitis C in his father.  ROS:   Please see the history of present illness.    *** All other systems reviewed and are negative.   Labs/Other Tests and Data Reviewed:    Recent Labs: 03/09/2018: TSH 2.75 11/09/2018: NT-Pro BNP 1,925 12/16/2018: Hemoglobin 14.4; Platelets 224 01/10/2019: ALT 11; BUN 15; Creatinine, Ser 0.99; Potassium 4.0; Sodium 135   Recent Lipid Panel Lab Results  Component Value Date/Time   CHOL 137 11/29/2018 04:30 AM   TRIG 94 11/29/2018 04:30 AM   TRIG 94 11/29/2018 04:30 AM   TRIG 150 (H) 09/28/2006 09:57 AM   HDL 42 11/29/2018 04:30 AM   CHOLHDL 3.3 11/29/2018 04:30 AM   LDLCALC 76 11/29/2018 04:30 AM   LDLDIRECT 126.5 01/18/2013 08:07 AM    Wt Readings from Last 3 Encounters:  01/31/19 158 lb 12.8 oz (72 kg)  01/10/19 156 lb (70.8 kg)  01/02/19 156 lb (70.8 kg)     Objective:    Vital Signs:  There were no vitals taken for this visit.   Well nourished, well developed male in no*** acute distress. ***  ASSESSMENT & PLAN:    1.  ***  COVID-19 Education: The  signs and symptoms of COVID-19 were discussed with the patient and how to seek care for testing (follow up with PCP or arrange E-visit).  ***The importance of social distancing was discussed today.  Patient Risk:   After full review of this patient's clinical status, I feel that they are at least moderate risk at this time.  Time:   Today, I have spent *** minutes with the patient with telehealth technology discussing ***.     Medication Adjustments/Labs and Tests Ordered: Current medicines are reviewed at length with the patient today.  Concerns regarding medicines are outlined above.  Tests Ordered: No orders of the defined types were placed in this encounter.  Medication  Changes: No orders of the defined types were placed in this encounter.   Disposition:  Follow up {follow up:15908}  Signed, Ermalinda Barrios, PA-C  02/23/2019 3:21 PM    [facility]

## 2019-02-23 NOTE — Telephone Encounter (Signed)
Dr. Camillia Herter template has not been opened for me to schedule on. Please reschedule once template has been verified. Thanks

## 2019-02-28 ENCOUNTER — Ambulatory Visit: Payer: Medicare HMO | Admitting: Physician Assistant

## 2019-02-28 ENCOUNTER — Encounter: Payer: Self-pay | Admitting: Adult Health

## 2019-03-07 ENCOUNTER — Ambulatory Visit: Payer: Medicare HMO | Admitting: Physical Medicine & Rehabilitation

## 2019-04-25 ENCOUNTER — Encounter: Payer: Self-pay | Admitting: Physical Medicine & Rehabilitation

## 2019-04-25 ENCOUNTER — Other Ambulatory Visit: Payer: Self-pay

## 2019-04-25 ENCOUNTER — Encounter: Payer: Medicare HMO | Attending: Physical Medicine & Rehabilitation | Admitting: Physical Medicine & Rehabilitation

## 2019-04-25 VITALS — BP 157/88 | HR 67 | Temp 98.0°F | Ht 67.0 in | Wt 153.8 lb

## 2019-04-25 DIAGNOSIS — M654 Radial styloid tenosynovitis [de Quervain]: Secondary | ICD-10-CM | POA: Diagnosis not present

## 2019-04-25 DIAGNOSIS — I610 Nontraumatic intracerebral hemorrhage in hemisphere, subcortical: Secondary | ICD-10-CM | POA: Insufficient documentation

## 2019-04-25 NOTE — Progress Notes (Signed)
Subjective:    Patient ID: Barry Woods, male    DOB: May 11, 1946, 73 y.o.   MRN: [id number]  HPI  Barry Woods is here in follow up of his right subcortical ICH. He has been moving well, without a device. He has even been playing golf. He hit a shot "fat" the weekend before last and injured his right wrist in fact which is still a bit sore but is improving. He has ongoing numbness in his left leg>arm and wonders about how long this will last. Sometimes it affects his balance.   He has lost weight since being admitted to the hospital and has been eating more healthy than before. He is exercising daily.   Pain Inventory Average Pain 0 Pain Right Now 0 My pain is no pain  In the last 24 hours, has pain interfered with the following? General activity 0 Relation with others 0 Enjoyment of life 0 What TIME of day is your pain at its worst? no pain Sleep (in general) NA  Pain is worse with: no pain Pain improves with: no pain Relief from Meds: no pain  Mobility walk without assistance  Function retired  Neuro/Psych No problems in this area  Prior Studies Any changes since last visit?  no  Physicians involved in your care Any changes since last visit?  no   Family History  Problem Relation Age of Onset  . Colon cancer Mother 54       57  . Hepatitis C Father   . CAD Father    Social History   Socioeconomic History  . Marital status: Divorced    Spouse name: Not on file  . Number of children: Not on file  . Years of education: Not on file  . Highest education level: Not on file  Occupational History  . Occupation: Retired-[employer]  . Financial resource strain: Not on file  . Food insecurity:    Worry: Not on file    Inability: Not on file  . Transportation needs:    Medical: Not on file    Non-medical: Not on file  Tobacco Use  . Smoking status: Never Smoker  . Smokeless tobacco: Never Used  Substance and Sexual Activity  . Alcohol  use: Not Currently    Alcohol/week: 14.0 standard drinks    Types: 14 Cans of beer per week  . Drug use: No  . Sexual activity: Not on file  Lifestyle  . Physical activity:    Days per week: Not on file    Minutes per session: Not on file  . Stress: Not on file  Relationships  . Social connections:    Talks on phone: Not on file    Gets together: Not on file    Attends religious service: Not on file    Active member of club or organization: Not on file    Attends meetings of clubs or organizations: Not on file    Relationship status: Not on file  Other Topics Concern  . Not on file  Social History Narrative  . Not on file   Past Surgical History:  Procedure Laterality Date  . COLONOSCOPY    . LIPOMA EXCISION    . PALATE / UVULA BIOPSY / EXCISION    . TONSILLECTOMY     age 41   Past Medical History:  Diagnosis Date  . Arthritis   . Cerebral cavernoma   . Hyperlipidemia   . Hypertension   . Rheumatoid  arthritis ([place])   . Stroke ([place])    BP (!) 157/88   Pulse 67   Temp 98 F (36.7 C)   Ht 5\' 7"  (1.702 m)   Wt 153 lb 12.8 oz (69.8 kg)   SpO2 98%   BMI 24.09 kg/m   Opioid Risk Score:   Fall Risk Score:  `1  Depression screen PHQ 2/9  Depression screen [facility] 2/9 04/25/2019 01/31/2019 01/02/2019 06/15/2018 02/16/2017 02/16/2017 09/24/2015  Decreased Interest 0 0 0 0 0 0 0  Down, Depressed, Hopeless 0 0 0 0 0 0 0  PHQ - 2 Score 0 0 0 0 0 0 0    Review of Systems  Constitutional: Negative.   HENT: Negative.   Eyes: Negative.   Respiratory: Negative.   Cardiovascular: Negative.   Gastrointestinal: Negative.   Endocrine: Negative.   Genitourinary: Negative.   Musculoskeletal:       Wrist pain right  Skin: Negative.   Allergic/Immunologic: Negative.   Neurological: Negative.   Hematological: Negative.   Psychiatric/Behavioral: Negative.   All other systems reviewed and are negative.      Objective:   Physical Exam Constitutional: No distress . Vital signs  reviewed. HEENT: EOMI, oral membranes moist Neck: supple Cardiovascular: RRR without murmur. No JVD    Respiratory: CTA Bilaterally without wheezes or rales. Normal effort    GI: BS +, non-tender, non-distended  Skin: Clean and intact without signs of breakdown Neuro:strength 5/5. Decreased LT LLE>LUE. Gait intact with normal stride/width. tandem gait struggled. No pronator drift. Able to FTN HTS with eyes closed.  Musculoskeletal: Right radial wrist tender. Finkelstein positive, mild swelling.  Full ROM, No pain with AROM or PROM in the neck, trunk, or extremities. Posture appropriate Psych: Pt's affect is appropriate. 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 Atrial fibrillation. Per cards 3. Right wrist pain, DeQuervain's tenosynovitis  -ice, splinting, rest  -no golfing until pain/strength back to normal 4. Rheumatoid arthritis. Patient on chronic prednisone as well as scheduled injections of Humira prior to admission. Humira Q Friday  5. Hypertension Continue atenolol  Good control at present.    Fifteen minutes of face to face patient care time were spent during this visit. All questions were encouraged and answered.  Follow up with me prn

## 2019-05-22 ENCOUNTER — Telehealth: Payer: Self-pay | Admitting: *Deleted

## 2019-05-22 NOTE — Telephone Encounter (Signed)
   Pt scheduled to see Dr. Angelena Form on July 2,2020 at 11:40  COVID-19 Pre-Screening Questions:  . In the past 7 to 10 days have you had a cough,  shortness of breath, headache, congestion, fever (100 or greater) body aches, chills, sore throat, or sudden loss of taste or sense of smell? no . Have you been around anyone with known Covid 19. no . Have you been around anyone who is awaiting Covid 19 test results in the past 7 to 10 days? no . Have you been around anyone who has been exposed to Covid 19, or has mentioned symptoms of Covid 19 within the past 7 to 10 days? No  Pt has mask and is aware to wear to appointment. Pt aware to arrive 15 minutes early and that no one can accompany him to appointment.

## 2019-05-24 ENCOUNTER — Encounter: Payer: Self-pay | Admitting: Neurology

## 2019-05-24 ENCOUNTER — Other Ambulatory Visit: Payer: Self-pay

## 2019-05-24 ENCOUNTER — Ambulatory Visit: Payer: Medicare HMO | Admitting: Neurology

## 2019-05-24 VITALS — BP 137/82 | HR 79 | Temp 97.5°F | Wt 153.8 lb

## 2019-05-24 DIAGNOSIS — I610 Nontraumatic intracerebral hemorrhage in hemisphere, subcortical: Secondary | ICD-10-CM

## 2019-05-24 NOTE — Progress Notes (Signed)
[facility] [address] [place]. [place] [address] [phone]       OFFICE FOLLOW-UP NOTE  Barry Woods Date of Birth:  [birth year] Medical Record Number:  [id number]   HPI: Initial visit 01/31/2019: 73 year old Caucasian male seen today for initial office follow-up visit following hospital admission for intracerebral hemorrhage in January 2020.  He is accompanied by his brother-in-law.  History is obtained from them and review of hospital electronic medical records.  I personally reviewed imaging films in PACS. Mr. Leino is a 73 year old male with past medical history of chronic atrial fibrillation on long-term anticoagulation with Eliquis, hyperlipidemia, hypertension, rheumatoid arthritis who presented on 11/27/2018 with sudden onset of gait difficulty with his legs dragging to the left.  He was unable to walk and EMS were called.  He had left hemiplegia with facial droop and slurred speech.  NIH stroke scale on admission was 7 code stroke was activated and on initial evaluation there was concern for LVO but CT scan of the head showed right basal ganglia hemorrhage with effacement of right lateral ventricle due to mass-effect.  The patient was on Eliquis at home and this was reversed with Andexxa.  Patient actually had scheduled planned cardioversion the next week for his A. fib.  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  Follow-up CT scan showed slight increase in size of the hemorrhage up to 21 cubic cc volume from previous 17 cubic cc volume with 2 mm right left shift.  Patient was admitted to the intensive care unit where blood pressure was tightly controlled.  MRI scan of the brain subsequently confirmed right basal ganglia hemorrhage with 2 to 3 mm right-to-left midline shift but no underlying tumor or high flow vascular malformation is noted.  There were few foci of hemosiderin noted elsewhere consistent with old prior hemorrhage.  There was also incidental small 4  mm left medial frontal subcortical white matter acute infarct.  Patient had prior history of brain cavernoma's for which he was followed by Dr. Kathyrn Sheriff neurosurgeon.  Patient neurological exam remained stable.  He had mild left hemiparesis 4/5 strength with weakness of left grip and intrinsic hand muscles.  He was seen by physical occupational and speech therapy.  He was initially started on dysphagia 3 diet and subsequently progressed.  He was transferred to inpatient rehab where he did very well and has not been discharged home.  He states he has obtained substantial improvement in his left-sided strength.  He is only slightly diminished fine motor skills in his left hand.  His left leg is little bit heavy and drags at times but he can walk independently without assistance.  He is living alone.  He denies any cognitive or memory difficulties.  He is currently on no antiplatelet agents or anticoagulants. Update 05/24/2019 : He returns for follow-up after last visit 3 months ago.  He states he is doing well is not had any further stroke or TIAs.  He states his blood pressure is usually well controlled and today it is 137/82.  His dose of Tenormin was increased a few months ago from 50 to 75 mg daily.  He does complain of feeling tired with decreased stamina and short of breath with exertion.  He does have an upcoming visit with his cardiologist Dr. Angelena Form later this week and plans to discuss this.  He was interested in participating in the ASpire stroke prevention trial but unfortunately ran out of the eligible time window due to the COVID  pandemic as the trial was on hold.  He has no new neurological complaints today.  He continues to have slight stiffness of his left leg and dragging as well as residual numbness on the left side which is unchanged.  He is tolerating aspirin 81 well without bruising or bleeding.  He does not want to go back on a stronger anticoagulant due to fear of bleeding ROS:   14 system  review of systems is positive for leg weakness, difficulty walking, shortness of breath with exertion, fatigue and decreased stamina and all other systems negative  PMH:  Past Medical History:  Diagnosis Date  . Arthritis   . Cerebral cavernoma   . Hyperlipidemia   . Hypertension   . Rheumatoid arthritis ([place])   . Stroke [facility])     Social History:  Social History   Socioeconomic History  . Marital status: Divorced    Spouse name: Not on file  . Number of children: Not on file  . Years of education: Not on file  . Highest education level: Not on file  Occupational History  . Occupation: Retired-[employer]  . Financial resource strain: Not on file  . Food insecurity    Worry: Not on file    Inability: Not on file  . Transportation needs    Medical: Not on file    Non-medical: Not on file  Tobacco Use  . Smoking status: Never Smoker  . Smokeless tobacco: Never Used  Substance and Sexual Activity  . Alcohol use: Not Currently    Alcohol/week: 14.0 standard drinks    Types: 14 Cans of beer per week  . Drug use: No  . Sexual activity: Not on file  Lifestyle  . Physical activity    Days per week: Not on file    Minutes per session: Not on file  . Stress: Not on file  Relationships  . Social Herbalist on phone: Not on file    Gets together: Not on file    Attends religious service: Not on file    Active member of club or organization: Not on file    Attends meetings of clubs or organizations: Not on file    Relationship status: Not on file  . Intimate partner violence    Fear of current or ex partner: Not on file    Emotionally abused: Not on file    Physically abused: Not on file    Forced sexual activity: Not on file  Other Topics Concern  . Not on file  Social History Narrative  . Not on file    Medications:   Current Outpatient Medications on File Prior to Visit  Medication Sig Dispense Refill  . Adalimumab (HUMIRA) 40 MG/0.8ML  PSKT Inject 40 mg into the skin See admin instructions. Every other week     . aspirin EC 81 MG tablet Take 1 tablet (81 mg total) by mouth daily. 90 tablet 1  . atenolol (TENORMIN) 50 MG tablet Take 75 tablets by mouth daily.     Marland Kitchen PREDNISONE PO Take by mouth. 2.5 to 5mg     . simvastatin (ZOCOR) 20 MG tablet Take 1 tablet (20 mg total) by mouth at bedtime. 100 tablet 4   No current facility-administered medications on file prior to visit.     Allergies:   Allergies  Allergen Reactions  . Atorvastatin Hives  . Cetirizine Hcl Hives    Physical Exam General: Frail elderly Caucasian male, seated, in  no evident distress Head: head normocephalic and atraumatic.  Neck: supple with no carotid or supraclavicular bruits Cardiovascular: regular rate and rhythm, no murmurs Musculoskeletal: no deformity Skin:  no rash/petichiae Vascular:  Normal pulses all extremities Vitals:   05/24/19 1057  BP: 137/82  Pulse: 79  Temp: (!) 97.5 F (36.4 C)   Neurologic Exam Mental Status: Awake and fully alert. Oriented to place and time. Recent and remote memory intact. Attention span, concentration and fund of knowledge appropriate. Mood and affect appropriate.  Cranial Nerves: Fundoscopic exam reveals sharp disc margins. Pupils equal, briskly reactive to light. Extraocular movements full without nystagmus. Visual fields full to confrontation. Hearing intact. Facial sensation intact. Face, tongue, palate moves normally and symmetrically.  Motor: Normal bulk and tone. Normal strength in all tested extremity muscles except mild weakness of left grip, intrinsic hand muscles, hip flexors and ankle dorsiflexors.  Tone is increased in the left leg compared to the right..  He orbits right over left upper extremity.  Fine finger movements are diminished on the left. Sensory.: intact to touch ,pinprick .position and vibratory sensation.  Coordination: Rapid alternating movements normal in all extremities.  Finger-to-nose and heel-to-shin performed accurately bilaterally. Gait and Station: Arises from chair without difficulty. Stance is normal. Gait demonstrates normal stride length and slight dragging of the left leg.. Able to heel, toe and tandem walk with moderate difficulty.  Reflexes: 1+ and symmetric. Toes downgoing.   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  He is doing remarkably well with minimal physical deficits.     PLAN: I had a long discussion with the patient regarding his recent basal ganglia hemorrhage related to Eliquis as well as small left frontal white matter lacunar infarct and discussed stroke risk with atrial fibrillation and risk reduction with antiplatelet therapy and anticoagulation.  I recommend he continue aspirin 81 mg daily and continue strict control of hypertension with blood pressure goal below 130/90 and lipids with LDL cholesterol goal below 70 mg percent and hemoglobin A1c goal below 6.5%.Marland Kitchen  He was advised to walk slowly and avoid sudden and quick movements and falls.  He was advised to eat a healthy diet and not to gain weight and to be active and exercise regularly.  He unfortunately is out of window for  participation in the ASPIRE stroke prevention trial at present time.. I advised him to discuss his decreased stamina and shortness of breath symptoms at upcoming visit with his cardiologist this week. He will return for follow-up in the future in 6 months with my nurse practitioner Janett Billow or call earlier if necessary Greater than 50% of time during this 25 minute visit was spent on counseling,explanation of diagnosis of intracerebral hemorrhage, planning of further management, discussion with patient and family and coordination of care Antony Contras, MD   [facility] [address] [place], [place] [address]  Phone [phone] Fax [phone] Note: This document was prepared with digital dictation and possible smart phrase technology. Any transcriptional errors that result from this process are unintentional

## 2019-05-24 NOTE — Patient Instructions (Signed)
I had a long discussion with the patient regarding his recent basal ganglia hemorrhage related to Eliquis as well as small left frontal white matter lacunar infarct and discussed stroke risk with atrial fibrillation and risk reduction with antiplatelet therapy and anticoagulation.  I recommend he continue aspirin 81 mg daily and continue strict control of hypertension with blood pressure goal below 130/90 and lipids with LDL cholesterol goal below 70 mg percent and hemoglobin A1c goal below 6.5%.Marland Kitchen  He was advised to walk slowly and avoid sudden and quick movements and falls.  He was advised to eat a healthy diet and not to gain weight and to be active and exercise regularly.  He unfortunately is out of window for  participation in the ASPIRE stroke prevention trial at present time.. I advised him to discuss his decreased stamina and shortness of breath symptoms at upcoming visit with his cardiologist this week. He will return for follow-up in the future in 6 months with my nurse practitioner Janett Billow or call earlier if necessary

## 2019-05-25 ENCOUNTER — Encounter: Payer: Self-pay | Admitting: Cardiovascular Disease

## 2019-05-25 ENCOUNTER — Ambulatory Visit (INDEPENDENT_AMBULATORY_CARE_PROVIDER_SITE_OTHER): Payer: Medicare HMO | Admitting: Cardiovascular Disease

## 2019-05-25 ENCOUNTER — Other Ambulatory Visit: Payer: Self-pay

## 2019-05-25 VITALS — BP 160/80 | HR 68 | Ht 67.0 in | Wt 155.2 lb

## 2019-05-25 DIAGNOSIS — E785 Hyperlipidemia, unspecified: Secondary | ICD-10-CM | POA: Diagnosis not present

## 2019-05-25 DIAGNOSIS — I2510 Atherosclerotic heart disease of native coronary artery without angina pectoris: Secondary | ICD-10-CM | POA: Diagnosis not present

## 2019-05-25 DIAGNOSIS — I5032 Chronic diastolic (congestive) heart failure: Secondary | ICD-10-CM | POA: Diagnosis not present

## 2019-05-25 DIAGNOSIS — I4819 Other persistent atrial fibrillation: Secondary | ICD-10-CM

## 2019-05-25 DIAGNOSIS — I10 Essential (primary) hypertension: Secondary | ICD-10-CM

## 2019-05-25 NOTE — Progress Notes (Signed)
Chief Complaint  Patient presents with  . Follow-up    CAD   History of Present Illness: 73 yo male with history of chronic diastolic CHF, persistent atrial fibrillation, HTN, HLD and rheumatoid arthritis here today for cardiac follow up. I saw him as a new consult for evaluation of abnormal CT scan on 04/05/17. CT chest showed coronary calcification. He had no complaints of chest pain or dyspnea. He is very active. I arranged a nuclear stress test on 04/08/17 which was normal and showed no ischemia. LV function was normal. He stopped drinking alcohlol in May 2019 and had a seizure. He had a brain scan that showed possible tumor. 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 He was found to have atrial fibrillation in December 2019 and was started on Eliquis. He was admitted with a stroke in January 2020 and was found to have an intracranial hemorrhage. Andexa was used to reverse his anti-coagulation. He was managed conservatively by Neurosurgery and was deemed to not be a candidate for future anti-coagulation. He has a history of alcohol abuse. He is no longer drinking.   He is here today for follow up. The patient denies any chest pain, palpitations, lower extremity edema, orthopnea, PND, dizziness, near syncope or syncope. He has dyspnea at times. Overall doing much better.   Primary Care Physician: Dorothyann Peng, NP  Past Medical History:  Diagnosis Date  . Arthritis   . Cerebral cavernoma   . Hyperlipidemia   . Hypertension   . Rheumatoid arthritis ([place])   . Stroke [facility])     Past Surgical History:  Procedure Laterality Date  . COLONOSCOPY    . LIPOMA EXCISION    . PALATE / UVULA BIOPSY / EXCISION    . TONSILLECTOMY     age 58    Current Outpatient Medications  Medication Sig Dispense Refill  . Adalimumab (HUMIRA) 40 MG/0.8ML PSKT Inject 40 mg into the skin See admin instructions. Every other week     . aspirin EC 81 MG tablet Take 1 tablet (81 mg  total) by mouth daily. 90 tablet 1  . atenolol (TENORMIN) 50 MG tablet Take 75 mg by mouth daily.     Marland Kitchen PREDNISONE PO Take by mouth. 2.5 to 5mg     . simvastatin (ZOCOR) 20 MG tablet Take 1 tablet (20 mg total) by mouth at bedtime. 100 tablet 4   No current facility-administered medications for this visit.     Allergies  Allergen Reactions  . Atorvastatin Hives  . Cetirizine Hcl Hives    Social History   Socioeconomic History  . Marital status: Divorced    Spouse name: Not on file  . Number of children: Not on file  . Years of education: Not on file  . Highest education level: Not on file  Occupational History  . Occupation: Retired-[employer]  . Financial resource strain: Not on file  . Food insecurity    Worry: Not on file    Inability: Not on file  . Transportation needs    Medical: Not on file    Non-medical: Not on file  Tobacco Use  . Smoking status: Never Smoker  . Smokeless tobacco: Never Used  Substance and Sexual Activity  . Alcohol use: Not Currently    Alcohol/week: 14.0 standard drinks    Types: 14 Cans of beer per week  . Drug use: No  . Sexual activity: Not on file  Lifestyle  .  Physical activity    Days per week: Not on file    Minutes per session: Not on file  . Stress: Not on file  Relationships  . Social Herbalist on phone: Not on file    Gets together: Not on file    Attends religious service: Not on file    Active member of club or organization: Not on file    Attends meetings of clubs or organizations: Not on file    Relationship status: Not on file  . Intimate partner violence    Fear of current or ex partner: Not on file    Emotionally abused: Not on file    Physically abused: Not on file    Forced sexual activity: Not on file  Other Topics Concern  . Not on file  Social History Narrative  . Not on file    Family History  Problem Relation Age of Onset  . Colon cancer Mother 46       57  . Hepatitis C  Father   . CAD Father     Review of Systems:  As stated in the HPI and otherwise negative.   BP (!) 160/80   Pulse 68   Ht 5\' 7"  (1.702 m)   Wt 155 lb 3.2 oz (70.4 kg)   SpO2 98%   BMI 24.31 kg/m   Physical Examination: General: Well developed, well nourished, NAD  HEENT: OP clear, mucus membranes moist  SKIN: warm, dry. No rashes. Neuro: No focal deficits  Musculoskeletal: Muscle strength 5/5 all ext  Psychiatric: Mood and affect normal  Neck: No JVD, no carotid bruits, no thyromegaly, no lymphadenopathy.  Lungs:Clear bilaterally, no wheezes, rhonci, crackles Cardiovascular: Irregular, irregular. No murmurs, gallops or rubs. Abdomen:Soft. Bowel sounds present. Non-tender.  Extremities: No lower extremity edema. Pulses are 2 + in the bilateral DP/PT.  Echo December 2019: - Left ventricle: The cavity size was normal. Wall thickness was   normal. Systolic function was normal. The estimated ejection   fraction was in the range of 60% to 65%. Incoordinate septal   motion. The study is not technically sufficient to allow   evaluation of LV diastolic function. - Mitral valve: Mildly thickened leaflets . There was trivial   regurgitation. - Left atrium: Moderately dilated. - Right ventricle: The cavity size was mildly dilated. - Right atrium: Moderately dilated. - Tricuspid valve: There was moderate regurgitation. - Pulmonary arteries: PA peak pressure: 70 mm Hg (S). - Inferior vena cava: The vessel was dilated. The respirophasic   diameter changes were blunted (< 50%), consistent with elevated   central venous pressure.  Impressions:  - LVEF 60-65%, normal wall thickness, incoordinate septal motion,   trivial MR, moderate biatrial enlargement, moderate TR, RVSP 70   mmHg, dilated IVC.  Nuclear stress May 2018:  Nuclear stress EF: 71%.  There was no ST segment deviation noted during stress.  The study is normal.  This is a low risk study.  The left ventricular  ejection fraction is hyperdynamic (>65%).   Normal pharmacologic nuclear study with no evidence of prior infarct or ischemia.  EKG:  EKG is not ordered today.  The EKG today demonstrates   Recent Labs: 11/09/2018: NT-Pro BNP 1,925 12/16/2018: Hemoglobin 14.4; Platelets 224 01/10/2019: ALT 11; BUN 15; Creatinine, Ser 0.99; Potassium 4.0; Sodium 135   Lipid Panel    Component Value Date/Time   CHOL 137 11/29/2018 0430   TRIG 94 11/29/2018 0430   TRIG 94 11/29/2018  0430   TRIG 150 (H) 09/28/2006 0957   HDL 42 11/29/2018 0430   CHOLHDL 3.3 11/29/2018 0430   VLDL 19 11/29/2018 0430   LDLCALC 76 11/29/2018 0430   LDLDIRECT 126.5 01/18/2013 0807     Wt Readings from Last 3 Encounters:  05/25/19 155 lb 3.2 oz (70.4 kg)  05/24/19 153 lb 12.8 oz (69.8 kg)  04/25/19 153 lb 12.8 oz (69.8 kg)     Other studies Reviewed: Additional studies/ records that were reviewed today include: . Review of the above records demonstrates:   Assessment and Plan:   1. CAD without angina: He has coronary calcification noted on CT chest. Nuclear stress test May 2018 with no ischemia. He has no chest pain. Will continue ASA, statin and beta blocker.   2. Atrial fibrillation,persistent: Rate controlled. Continue beta blocker. Not a candidate for long term anti-coagulation.   3. HTN: BP is well controlled at home. I reviewed his home logs.   4. HLD: Continue statin  5. Chronic diastolic CHF: Weight stable. Continue Lasix as needed.   Current medicines are reviewed at length with the patient today.  The patient does not have concerns regarding medicines.  The following changes have been made:  no change  Labs/ tests ordered today include:   No orders of the defined types were placed in this encounter.  Disposition:   FU with me in 6 months.   Signed, Lauree Chandler, MD 05/25/2019 11:48 AM    [facility] [address], [place], [place]  [address] Phone: [phone]; Fax: [phone]

## 2019-05-25 NOTE — Patient Instructions (Signed)
Medication Instructions:  Your provider recommends that you continue on your current medications as directed. Please refer to the Current Medication list given to you today.    Labwork: None  Testing/Procedures: None  Follow-Up: Your provider wants you to follow-up in: 6 months with Dr. Angelena Form. You will receive a reminder letter in the mail two months in advance. If you don't receive a letter, please call our office to schedule the follow-up appointment.

## 2019-06-02 MED ORDER — AMLODIPINE BESYLATE 5 MG PO TABS: ORAL_TABLET | ORAL | 3 refills | Status: DC

## 2019-06-09 ENCOUNTER — Encounter: Payer: Self-pay | Admitting: Adult Health

## 2019-06-09 ENCOUNTER — Telehealth: Payer: Self-pay | Admitting: Adult Health

## 2019-06-09 ENCOUNTER — Other Ambulatory Visit: Payer: Self-pay | Admitting: Adult Health

## 2019-06-09 DIAGNOSIS — E7800 Pure hypercholesterolemia, unspecified: Secondary | ICD-10-CM

## 2019-06-09 MED ORDER — SIMVASTATIN 20 MG PO TABS: 20.0000 mg | ORAL_TABLET | Freq: Every day | ORAL | 0 refills | Status: DC

## 2019-06-09 NOTE — Telephone Encounter (Signed)
Pt is on the line yelling about his medication at he stated that someone said it was at the pharmacy he stated that he checked with the pharmacy and he said there was not any thing there.  Pt state would just like to have this medication so that he will not have another stroke.    Misty was msg and said: Let Barry Woods know that I have sent the request to [facility]. Tommi Rumps was not the last prescriber to fill it so I have to get authorization  Pt is aware of the above msg from [place].

## 2019-06-09 NOTE — Telephone Encounter (Signed)
See mychart message. Nothing further needed.

## 2019-06-09 NOTE — Telephone Encounter (Signed)
Copied from [place] [phone]. Topic: Quick Communication - Rx Refill/Question >> Jun 09, 2019 11:23 AM Leward Quan A wrote: Medication: simvastatin (ZOCOR) 20 MG tablet   Patient is completely out and need refill today please   Has the patient contacted their pharmacy? yes  (Agent: If no, request that the patient contact the pharmacy for the refill.) (Agent: If yes, when and what did the pharmacy advise?)  Preferred Pharmacy (with phone number or street name): [facility] [address], [place] [id number] (Phone) [phone] (Fax)    Agent: Please be advised that RX refills may take up to 3 business days. We ask that you follow-up with your pharmacy.

## 2019-06-13 ENCOUNTER — Other Ambulatory Visit: Payer: Self-pay | Admitting: Family Medicine

## 2019-06-13 NOTE — Telephone Encounter (Signed)
Medication was sent to [place], [place] on 06/09/2019

## 2019-06-29 ENCOUNTER — Ambulatory Visit: Payer: Medicare HMO | Admitting: Cardiovascular Disease

## 2019-07-05 ENCOUNTER — Other Ambulatory Visit: Payer: Self-pay | Admitting: Adult Health

## 2019-07-06 ENCOUNTER — Encounter: Payer: Medicare HMO | Admitting: Adult Health

## 2019-07-06 ENCOUNTER — Encounter: Payer: Self-pay | Admitting: Adult Health

## 2019-07-07 ENCOUNTER — Other Ambulatory Visit: Payer: Self-pay

## 2019-07-07 MED ORDER — ATENOLOL 50 MG PO TABS: 75.0000 mg | ORAL_TABLET | Freq: Every day | ORAL | 3 refills | Status: DC

## 2019-07-11 NOTE — Telephone Encounter (Signed)
Noted  

## 2019-07-11 NOTE — Telephone Encounter (Signed)
DENIED.  FILLED BY DR. Lauree Chandler ON 07/07/2019

## 2019-08-10 ENCOUNTER — Other Ambulatory Visit: Payer: Self-pay

## 2019-08-10 ENCOUNTER — Encounter: Payer: Self-pay | Admitting: Adult Health

## 2019-08-10 ENCOUNTER — Ambulatory Visit (INDEPENDENT_AMBULATORY_CARE_PROVIDER_SITE_OTHER): Payer: Medicare HMO | Admitting: Adult Health

## 2019-08-10 VITALS — BP 140/80 | HR 67 | Temp 97.9°F | Wt 158.6 lb

## 2019-08-10 DIAGNOSIS — I619 Nontraumatic intracerebral hemorrhage, unspecified: Secondary | ICD-10-CM | POA: Diagnosis not present

## 2019-08-10 DIAGNOSIS — Z0000 Encounter for general adult medical examination without abnormal findings: Secondary | ICD-10-CM

## 2019-08-10 DIAGNOSIS — I10 Essential (primary) hypertension: Secondary | ICD-10-CM

## 2019-08-10 DIAGNOSIS — I482 Chronic atrial fibrillation, unspecified: Secondary | ICD-10-CM

## 2019-08-10 DIAGNOSIS — N401 Enlarged prostate with lower urinary tract symptoms: Secondary | ICD-10-CM | POA: Diagnosis not present

## 2019-08-10 DIAGNOSIS — M069 Rheumatoid arthritis, unspecified: Secondary | ICD-10-CM

## 2019-08-10 DIAGNOSIS — R351 Nocturia: Secondary | ICD-10-CM | POA: Diagnosis not present

## 2019-08-10 DIAGNOSIS — E7800 Pure hypercholesterolemia, unspecified: Secondary | ICD-10-CM | POA: Diagnosis not present

## 2019-08-10 LAB — CBC WITH DIFFERENTIAL/PLATELET
Basophils Absolute: 0.1 10*3/uL (ref 0.0–0.1)
Basophils Relative: 0.7 % (ref 0.0–3.0)
Eosinophils Absolute: 0.2 10*3/uL (ref 0.0–0.7)
Eosinophils Relative: 2.4 % (ref 0.0–5.0)
HCT: 42.7 % (ref 39.0–52.0)
Hemoglobin: 14.4 g/dL (ref 13.0–17.0)
Lymphocytes Relative: 13.4 % (ref 12.0–46.0)
Lymphs Abs: 1 10*3/uL (ref 0.7–4.0)
MCHC: 33.7 g/dL (ref 30.0–36.0)
MCV: 88.3 fl (ref 78.0–100.0)
Monocytes Absolute: 0.8 10*3/uL (ref 0.1–1.0)
Monocytes Relative: 9.9 % (ref 3.0–12.0)
Neutro Abs: 5.6 10*3/uL (ref 1.4–7.7)
Neutrophils Relative %: 73.6 % (ref 43.0–77.0)
Platelets: 156 10*3/uL (ref 150.0–400.0)
RBC: 4.83 Mil/uL (ref 4.22–5.81)
RDW: 14.9 % (ref 11.5–15.5)
WBC: 7.6 10*3/uL (ref 4.0–10.5)

## 2019-08-10 LAB — PSA: PSA: 2.69 ng/mL (ref 0.10–4.00)

## 2019-08-10 LAB — COMPREHENSIVE METABOLIC PANEL
ALT: 14 U/L (ref 0–53)
AST: 16 U/L (ref 0–37)
Albumin: 4 g/dL (ref 3.5–5.2)
Alkaline Phosphatase: 49 U/L (ref 39–117)
BUN: 12 mg/dL (ref 6–23)
CO2: 29 mEq/L (ref 19–32)
Calcium: 9.3 mg/dL (ref 8.4–10.5)
Chloride: 99 mEq/L (ref 96–112)
Creatinine, Ser: 0.99 mg/dL (ref 0.40–1.50)
GFR: 74.51 mL/min (ref >60.00)
Glucose, Bld: 90 mg/dL (ref 70–99)
Potassium: 4.1 mEq/L (ref 3.5–5.1)
Sodium: 135 mEq/L (ref 135–145)
Total Bilirubin: 2.1 mg/dL — ABNORMAL HIGH (ref 0.2–1.2)
Total Protein: 8.2 g/dL (ref 6.0–8.3)

## 2019-08-10 LAB — LIPID PANEL
Cholesterol: 138 mg/dL (ref 0–200)
HDL: 58.6 mg/dL (ref >39.00)
LDL Cholesterol: 65 mg/dL (ref 0–99)
NonHDL: 79.03
Total CHOL/HDL Ratio: 2
Triglycerides: 72 mg/dL (ref 0.0–149.0)
VLDL: 14.4 mg/dL (ref 0.0–40.0)

## 2019-08-10 LAB — TSH: TSH: 2.14 u[IU]/mL (ref 0.35–4.50)

## 2019-08-10 NOTE — Progress Notes (Signed)
Subjective:    Patient ID: Barry Woods, male    DOB: 1946-03-29, 73 y.o.   MRN: [id number]  HPI  Patient presents for yearly preventative medicine examination. He is a pleasant 73 year old male who  has a past medical history of Arthritis, Cerebral cavernoma, Hyperlipidemia, Hypertension, Rheumatoid arthritis ([place]), and Stroke ([place]).  CVA-intracerebral hemorrhage in January 2020.  He is followed by neurology.  He states that he is doing well and has had no further stroke like symptoms.  He continues to have mild stiffness in his left leg and dragging as well as some residual numbness on the left side which has been unchanged.  He is tolerating aspirin 81 mg well.   Chronic atrial fibrillation-followed by cardiology.  Due to stroke he is not a candidate for long-term anticoagulation.  He is rate controlled on atenolol 75 mg daily Hyperlipidemia  Hypertension -He is prescribed Norvasc 5 mg and Atenolol 75 mg. He reports that he cut back the Norvasc to 2.5 mg due to fatigue. He has been monitoring his blood pressure at home and reports that his readings have been in the [id number] systolic with most of his readings being above 140/80. He denies headaches, blurred vision, chest pain, shortness of breath BP Readings from Last 3 Encounters:  08/10/19 140/80  05/25/19 (!) 160/80  05/24/19 137/82   Rheumatoid arthritis -followed by rheumatology ( Dr. Amil Amen).  Currently prescribed Humira 40 mg subq  BPH - asymptomatic at this time  All immunizations and health maintenance protocols were reviewed with the patient and needed orders were placed. Refuses flu shot   Appropriate screening laboratory values were ordered for the patient including screening of hyperlipidemia, renal function and hepatic function. If indicated by BPH, a PSA was ordered.  Medication reconciliation,  past medical history, social history, problem list and allergies were reviewed in detail with the patient  Goals were  established with regard to weight loss, exercise, and  diet in compliance with medications.  He does do home exercises and tries to walk as much as possible does report that he becomes short of breath and does not feel as though his stamina is not as good as it used to be prior to stroke.  He continues to work through and is trying to build up his stamina  End of life planning was discussed.   Review of Systems  Constitutional: Negative.   HENT: Negative.   Eyes: Negative.   Respiratory: Negative.   Cardiovascular: Negative.   Gastrointestinal: Negative.   Endocrine: Negative.   Genitourinary: Negative.   Musculoskeletal: Positive for arthralgias.  Skin: Negative.   Allergic/Immunologic: Negative.   Neurological: Positive for numbness.  Hematological: Negative.   Psychiatric/Behavioral: Negative.   All other systems reviewed and are negative.  Past Medical History:  Diagnosis Date  . Arthritis   . Cerebral cavernoma   . Hyperlipidemia   . Hypertension   . Rheumatoid arthritis ([place])   . Stroke [facility])     Social History   Socioeconomic History  . Marital status: Divorced    Spouse name: Not on file  . Number of children: Not on file  . Years of education: Not on file  . Highest education level: Not on file  Occupational History  . Occupation: Retired-[employer]  . Financial resource strain: Not on file  . Food insecurity    Worry: Not on file    Inability: Not on file  . Transportation needs  Medical: Not on file    Non-medical: Not on file  Tobacco Use  . Smoking status: Never Smoker  . Smokeless tobacco: Never Used  Substance and Sexual Activity  . Alcohol use: Not Currently    Alcohol/week: 14.0 standard drinks    Types: 14 Cans of beer per week  . Drug use: No  . Sexual activity: Not on file  Lifestyle  . Physical activity    Days per week: Not on file    Minutes per session: Not on file  . Stress: Not on file  Relationships  . Social  Herbalist on phone: Not on file    Gets together: Not on file    Attends religious service: Not on file    Active member of club or organization: Not on file    Attends meetings of clubs or organizations: Not on file    Relationship status: Not on file  . Intimate partner violence    Fear of current or ex partner: Not on file    Emotionally abused: Not on file    Physically abused: Not on file    Forced sexual activity: Not on file  Other Topics Concern  . Not on file  Social History Narrative  . Not on file    Past Surgical History:  Procedure Laterality Date  . COLONOSCOPY    . LIPOMA EXCISION    . PALATE / UVULA BIOPSY / EXCISION    . TONSILLECTOMY     age 49    Family History  Problem Relation Age of Onset  . Colon cancer Mother 53       57  . Hepatitis C Father   . CAD Father     Allergies  Allergen Reactions  . Atorvastatin Hives  . Cetirizine Hcl Hives    Current Outpatient Medications on File Prior to Visit  Medication Sig Dispense Refill  . Adalimumab (HUMIRA) 40 MG/0.8ML PSKT Inject 40 mg into the skin See admin instructions. Every other week     . amLODipine (NORVASC) 5 MG tablet Take one tablet by mouth every evening 90 tablet 3  . aspirin EC 81 MG tablet Take 1 tablet (81 mg total) by mouth daily. 90 tablet 1  . atenolol (TENORMIN) 50 MG tablet Take 1.5 tablets (75 mg total) by mouth daily. 270 tablet 3  . PREDNISONE PO Take by mouth. 2.5 to 5mg     . simvastatin (ZOCOR) 20 MG tablet Take 1 tablet (20 mg total) by mouth at bedtime. 90 tablet 0   No current facility-administered medications on file prior to visit.     BP 140/80 (BP Location: Left Arm, Patient Position: Sitting, Cuff Size: Normal)   Pulse 67   Temp 97.9 F (36.6 C) (Temporal)   Wt 158 lb 9.6 oz (71.9 kg)   SpO2 99%   BMI 24.84 kg/m       Objective:   Physical Exam Vitals signs and nursing note reviewed.  Constitutional:      General: He is not in acute distress.     Appearance: Normal appearance. He is not diaphoretic.  HENT:     Head: Normocephalic and atraumatic.     Right Ear: Tympanic membrane, ear canal and external ear normal.     Left Ear: Tympanic membrane, ear canal and external ear normal.     Nose: Nose normal.     Mouth/Throat:     Mouth: Mucous membranes are moist.     Pharynx:  Oropharynx is clear. No oropharyngeal exudate.  Eyes:     General: No scleral icterus.       Right eye: No discharge.        Left eye: No discharge.     Extraocular Movements: Extraocular movements intact.     Conjunctiva/sclera: Conjunctivae normal.     Pupils: Pupils are equal, round, and reactive to light.  Neck:     Musculoskeletal: Normal range of motion and neck supple.     Thyroid: No thyromegaly.     Vascular: No JVD.     Trachea: No tracheal deviation.  Cardiovascular:     Rate and Rhythm: Normal rate. Rhythm irregularly irregular.     Pulses: Normal pulses.     Heart sounds: Normal heart sounds. No murmur. No friction rub. No gallop.   Pulmonary:     Effort: Pulmonary effort is normal. No respiratory distress.     Breath sounds: Normal breath sounds. No stridor. No wheezing, rhonchi or rales.  Chest:     Chest wall: No tenderness.  Abdominal:     General: Bowel sounds are normal. There is no distension.     Palpations: Abdomen is soft. There is no mass.     Tenderness: There is no abdominal tenderness. There is no right CVA tenderness, left CVA tenderness, guarding or rebound.     Hernia: No hernia is present.  Musculoskeletal: Normal range of motion.        General: No swelling, tenderness, deformity or signs of injury.     Right lower leg: No edema.     Left lower leg: No edema.  Lymphadenopathy:     Cervical: No cervical adenopathy.  Skin:    General: Skin is warm and dry.     Capillary Refill: Capillary refill takes less than 2 seconds.     Coloration: Skin is not jaundiced or pale.     Findings: No bruising, erythema or rash.   Neurological:     General: No focal deficit present.     Mental Status: He is alert and oriented to person, place, and time. Mental status is at baseline.     Cranial Nerves: No cranial nerve deficit.     Sensory: No sensory deficit.     Motor: No weakness or abnormal muscle tone.     Coordination: Coordination normal.     Gait: Gait normal.     Deep Tendon Reflexes: Reflexes normal.     Reflex Scores:      Tricep reflexes are 1+ on the right side and 1+ on the left side.      Bicep reflexes are 1+ on the right side and 1+ on the left side.      Brachioradialis reflexes are 1+ on the right side and 1+ on the left side.      Patellar reflexes are 1+ on the right side and 1+ on the left side.      Achilles reflexes are 1+ on the right side and 1+ on the left side. Psychiatric:        Mood and Affect: Mood normal.        Thought Content: Thought content normal.        Judgment: Judgment normal.       Assessment & Plan:  1. Routine general medical examination at a health care facility - Follow up in one year or sooner if needed - Continue to exercise and eat heart health - Refrain from alcohol  - CBC with  Differential/Platelet - Comprehensive metabolic panel - Lipid panel - TSH  2. Pure hypercholesterolemia - Continue statin  - CBC with Differential/Platelet - Comprehensive metabolic panel - Lipid panel - TSH  3. Hemorrhagic stroke ([place]) - Follow up with neurology as directed - CBC with Differential/Platelet - Comprehensive metabolic panel - Lipid panel - TSH  4. Chronic atrial fibrillation - Continue with BB for rate control  - CBC with Differential/Platelet - Comprehensive metabolic panel - Lipid panel - TSH  5. Essential hypertension - Encouraged to take medication as prescribed. If he has issues with his BP medications then notify cardiology  - CBC with Differential/Platelet - Comprehensive metabolic panel - Lipid panel - TSH  6. Rheumatoid arthritis,  involving unspecified site, unspecified rheumatoid factor presence ([place]) - Continue with Rheumatology plan of care  7. BPH associated with nocturia  - PSA  Dorothyann Peng, NP

## 2019-08-17 DIAGNOSIS — M0589 Other rheumatoid arthritis with rheumatoid factor of multiple sites: Secondary | ICD-10-CM | POA: Diagnosis not present

## 2019-08-17 DIAGNOSIS — Z6824 Body mass index (BMI) 24.0-24.9, adult: Secondary | ICD-10-CM | POA: Diagnosis not present

## 2019-08-17 DIAGNOSIS — M25512 Pain in left shoulder: Secondary | ICD-10-CM | POA: Diagnosis not present

## 2019-08-17 DIAGNOSIS — R945 Abnormal results of liver function studies: Secondary | ICD-10-CM | POA: Diagnosis not present

## 2019-08-23 DIAGNOSIS — L821 Other seborrheic keratosis: Secondary | ICD-10-CM | POA: Diagnosis not present

## 2019-08-23 DIAGNOSIS — Z85828 Personal history of other malignant neoplasm of skin: Secondary | ICD-10-CM | POA: Diagnosis not present

## 2019-08-23 DIAGNOSIS — D235 Other benign neoplasm of skin of trunk: Secondary | ICD-10-CM | POA: Diagnosis not present

## 2019-08-23 DIAGNOSIS — D225 Melanocytic nevi of trunk: Secondary | ICD-10-CM | POA: Diagnosis not present

## 2019-09-12 DIAGNOSIS — R69 Illness, unspecified: Secondary | ICD-10-CM | POA: Diagnosis not present

## 2019-10-03 ENCOUNTER — Other Ambulatory Visit: Payer: Self-pay | Admitting: Adult Health

## 2019-10-03 DIAGNOSIS — E7800 Pure hypercholesterolemia, unspecified: Secondary | ICD-10-CM

## 2019-10-04 ENCOUNTER — Encounter: Payer: Self-pay | Admitting: Adult Health

## 2019-10-04 NOTE — Telephone Encounter (Signed)
Sent by e-scribe. 

## 2019-11-29 ENCOUNTER — Encounter: Payer: Self-pay | Admitting: Cardiovascular Disease

## 2019-11-29 NOTE — Progress Notes (Signed)
Chief Complaint  Patient presents with  . Follow-up    atrial fibrillation   History of Present Illness: 74 yo male with history of chronic diastolic CHF, persistent atrial fibrillation, HTN, HLD and rheumatoid arthritis here today for cardiac follow up. I saw him as a new consult for evaluation of abnormal CT scan on 04/05/17. CT chest showed coronary calcification. He had no complaints of chest pain or dyspnea. He is very active. I arranged a nuclear stress test on 04/08/17 which was normal and showed no ischemia. LV function was normal. He stopped drinking alcohlol in May 2019 and had a seizure. He had a brain scan that showed possible tumor. 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 He was found to have atrial fibrillation in December 2019 and was started on Eliquis. He was admitted with a stroke in January 2020 and was found to have an intracranial hemorrhage. Andexa was used to reverse his anti-coagulation. He was managed conservatively by Neurosurgery and was deemed to not be a candidate for future anti-coagulation.   He is here today for follow up. The patient denies any chest pain, dyspnea, palpitations, lower extremity edema, orthopnea, PND, dizziness, near syncope or syncope. He is getting stronger. He feels great. He is concerned about not being able to take a blood thinner for stroke prevention.   Primary Care Physician: Dorothyann Peng, NP  Past Medical History:  Diagnosis Date  . Arthritis   . Cerebral cavernoma   . Hyperlipidemia   . Hypertension   . Persistent atrial fibrillation ([place])   . Rheumatoid arthritis ([place])   . Stroke [facility])     Past Surgical History:  Procedure Laterality Date  . COLONOSCOPY    . LIPOMA EXCISION    . PALATE / UVULA BIOPSY / EXCISION    . TONSILLECTOMY     age 74    Current Outpatient Medications  Medication Sig Dispense Refill  . Adalimumab (HUMIRA) 40 MG/0.8ML PSKT Inject 40 mg into the skin See admin  instructions. Every other week     . amLODipine (NORVASC) 5 MG tablet Take one tablet by mouth every evening 90 tablet 3  . aspirin EC 81 MG tablet Take 1 tablet (81 mg total) by mouth daily. 90 tablet 1  . atenolol (TENORMIN) 50 MG tablet Take 1.5 tablets (75 mg total) by mouth daily. 270 tablet 3  . PREDNISONE PO Take 2.5 mg by mouth daily.     . simvastatin (ZOCOR) 20 MG tablet TAKE 1 TABLET BY MOUTH AT BEDTIME 90 tablet 3   No current facility-administered medications for this visit.    Allergies  Allergen Reactions  . Atorvastatin Hives  . Cetirizine Hcl Hives    Social History   Socioeconomic History  . Marital status: Divorced    Spouse name: Not on file  . Number of children: Not on file  . Years of education: Not on file  . Highest education level: Not on file  Occupational History  . 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 Smoking status: Never Smoker  . Smokeless tobacco: Never Used  Substance and Sexual Activity  . Alcohol use: Not Currently    Alcohol/week: 14.0 standard drinks    Types: 14 Cans of beer per week  . Drug use: No  . Sexual activity: Not on file  Other Topics Concern  . Not on file  Social History Narrative  . Not on file   Social Determinants  of Health   Financial Resource Strain:   . Difficulty of Paying Living Expenses: Not on file  Food Insecurity:   . Worried About Charity fundraiser in the Last Year: Not on file  . Ran Out of Food in the Last Year: Not on file  Transportation Needs:   . Lack of Transportation (Medical): Not on file  . Lack of Transportation (Non-Medical): Not on file  Physical Activity:   . Days of Exercise per Week: Not on file  . Minutes of Exercise per Session: Not on file  Stress:   . Feeling of Stress : Not on file  Social Connections:   . Frequency of Communication with Friends and Family: Not on file  . Frequency of Social Gatherings with Friends and Family: Not on file  . Attends Religious  Services: Not on file  . Active Member of Clubs or Organizations: Not on file  . Attends Archivist Meetings: Not on file  . Marital Status: Not on file  Intimate Partner Violence:   . Fear of Current or Ex-Partner: Not on file  . Emotionally Abused: Not on file  . Physically Abused: Not on file  . Sexually Abused: Not on file    Family History  Problem Relation Age of Onset  . Colon cancer Mother 92       57  . Hepatitis C Father   . CAD Father     Review of Systems:  As stated in the HPI and otherwise negative.   BP 122/72   Pulse 62   Ht 5\' 7"  (1.702 m)   Wt 162 lb 3.2 oz (73.6 kg)   SpO2 98%   BMI 25.40 kg/m   Physical Examination: General: Well developed, well nourished, NAD  HEENT: OP clear, mucus membranes moist  SKIN: warm, dry. No rashes. Neuro: No focal deficits  Musculoskeletal: Muscle strength 5/5 all ext  Psychiatric: Mood and affect normal  Neck: No JVD, no carotid bruits, no thyromegaly, no lymphadenopathy.  Lungs:Clear bilaterally, no wheezes, rhonci, crackles Cardiovascular: Irregular irregular. No murmurs, gallops or rubs. Abdomen:Soft. Bowel sounds present. Non-tender.  Extremities: No lower extremity edema. Pulses are 2 + in the bilateral DP/PT.  Echo December 2019: - Left ventricle: The cavity size was normal. Wall thickness was   normal. Systolic function was normal. The estimated ejection   fraction was in the range of 60% to 65%. Incoordinate septal   motion. The study is not technically sufficient to allow   evaluation of LV diastolic function. - Mitral valve: Mildly thickened leaflets . There was trivial   regurgitation. - Left atrium: Moderately dilated. - Right ventricle: The cavity size was mildly dilated. - Right atrium: Moderately dilated. - Tricuspid valve: There was moderate regurgitation. - Pulmonary arteries: PA peak pressure: 70 mm Hg (S). - Inferior vena cava: The vessel was dilated. The respirophasic   diameter  changes were blunted (< 50%), consistent with elevated   central venous pressure.  Impressions:  - LVEF 60-65%, normal wall thickness, incoordinate septal motion,   trivial MR, moderate biatrial enlargement, moderate TR, RVSP 70   mmHg, dilated IVC.  Nuclear stress May 2018:  Nuclear stress EF: 71%.  There was no ST segment deviation noted during stress.  The study is normal.  This is a low risk study.  The left ventricular ejection fraction is hyperdynamic (>65%).   Normal pharmacologic nuclear study with no evidence of prior infarct or ischemia.  EKG:  EKG is ordered today.  The EKG today demonstrates Atrial fibrillation, rate 65 bpm  Recent Labs: 08/10/2019: ALT 14; BUN 12; Creatinine, Ser 0.99; Hemoglobin 14.4; Platelets 156.0; Potassium 4.1; Sodium 135; TSH 2.14   Lipid Panel    Component Value Date/Time   CHOL 138 08/10/2019 0932   TRIG 72.0 08/10/2019 0932   TRIG 150 (H) 09/28/2006 0957   HDL 58.60 08/10/2019 0932   CHOLHDL 2 08/10/2019 0932   VLDL 14.4 08/10/2019 0932   LDLCALC 65 08/10/2019 0932   LDLDIRECT 126.5 01/18/2013 0807     Wt Readings from Last 3 Encounters:  11/30/19 162 lb 3.2 oz (73.6 kg)  08/10/19 158 lb 9.6 oz (71.9 kg)  05/25/19 155 lb 3.2 oz (70.4 kg)     Other studies Reviewed: Additional studies/ records that were reviewed today include: . Review of the above records demonstrates:   Assessment and Plan:   1. CAD without angina: He has coronary calcification noted on CT chest. Nuclear stress test May 2018 with no ischemia. He has no chest pain. Continue ASA, statin and beta blocker.    2. Atrial fibrillation,persistent: He is in atrial fibrillation today. Rate is controlled. Not a candidate for long term anti-coagulation given prior intracranial hemorrhage while on a DOAC. Continue ASA and beta blocker.   3. HTN: BP is controlled. Home log reviewed.   4. HLD: LDL 65 September 2020. Will continue statin.   5. Chronic diastolic  CHF: Weight is stable. No evidence of volume overload. Lasix prn.   Current medicines are reviewed at length with the patient today.  The patient does not have concerns regarding medicines.  The following changes have been made:  no change  Labs/ tests ordered today include:   Orders Placed This Encounter  Procedures  . EKG 12-Lead   Disposition:   FU with me in 6 months.   Signed, Lauree Chandler, MD 11/30/2019 3:06 PM    [facility] [place], [place], [place]  [address] Phone: [phone]; Fax: [phone]

## 2019-11-30 ENCOUNTER — Encounter: Payer: Self-pay | Admitting: Cardiovascular Disease

## 2019-11-30 ENCOUNTER — Ambulatory Visit: Payer: Medicare HMO | Admitting: Cardiovascular Disease

## 2019-11-30 ENCOUNTER — Other Ambulatory Visit: Payer: Self-pay

## 2019-11-30 VITALS — BP 122/72 | HR 62 | Ht 67.0 in | Wt 162.2 lb

## 2019-11-30 DIAGNOSIS — I10 Essential (primary) hypertension: Secondary | ICD-10-CM

## 2019-11-30 DIAGNOSIS — E785 Hyperlipidemia, unspecified: Secondary | ICD-10-CM

## 2019-11-30 DIAGNOSIS — I4819 Other persistent atrial fibrillation: Secondary | ICD-10-CM

## 2019-11-30 DIAGNOSIS — I2510 Atherosclerotic heart disease of native coronary artery without angina pectoris: Secondary | ICD-10-CM | POA: Diagnosis not present

## 2019-11-30 DIAGNOSIS — I5032 Chronic diastolic (congestive) heart failure: Secondary | ICD-10-CM

## 2019-11-30 NOTE — Patient Instructions (Signed)
Medication Instructions:  No changes *If you need a refill on your cardiac medications before your next appointment, please call your pharmacy*  Lab Work: none If you have labs (blood work) drawn today and your tests are completely normal, you will receive your results only by: Marland Kitchen MyChart Message (if you have MyChart) OR . A paper copy in the mail If you have any lab test that is abnormal or we need to change your treatment, we will call you to review the results.  Testing/Procedures: none  Follow-Up: At [facility], you and your health needs are our priority.  As part of our continuing mission to provide you with exceptional heart care, we have created designated Provider Care Teams.  These Care Teams include your primary Cardiologist (physician) and Advanced Practice Providers (APPs -  Physician Assistants and Nurse Practitioners) who all work together to provide you with the care you need, when you need it.  Your next appointment:   6 month(s)  The format for your next appointment:   Either In Person or Virtual  Provider:   Lauree Chandler, MD  Other Instructions

## 2019-12-12 DIAGNOSIS — Z85828 Personal history of other malignant neoplasm of skin: Secondary | ICD-10-CM | POA: Diagnosis not present

## 2019-12-12 DIAGNOSIS — L111 Transient acantholytic dermatosis [Grover]: Secondary | ICD-10-CM | POA: Diagnosis not present

## 2019-12-12 DIAGNOSIS — L309 Dermatitis, unspecified: Secondary | ICD-10-CM | POA: Diagnosis not present

## 2019-12-12 DIAGNOSIS — D692 Other nonthrombocytopenic purpura: Secondary | ICD-10-CM | POA: Diagnosis not present

## 2019-12-19 ENCOUNTER — Other Ambulatory Visit: Payer: Self-pay

## 2019-12-19 ENCOUNTER — Encounter: Payer: Self-pay | Admitting: Adult Health

## 2019-12-19 ENCOUNTER — Telehealth: Payer: Self-pay | Admitting: Adult Health

## 2019-12-19 ENCOUNTER — Ambulatory Visit: Payer: Medicare HMO | Admitting: Adult Health

## 2019-12-19 VITALS — BP 124/76 | HR 71 | Temp 96.1°F | Ht 67.0 in | Wt 160.0 lb

## 2019-12-19 DIAGNOSIS — I480 Paroxysmal atrial fibrillation: Secondary | ICD-10-CM | POA: Diagnosis not present

## 2019-12-19 DIAGNOSIS — I610 Nontraumatic intracerebral hemorrhage in hemisphere, subcortical: Secondary | ICD-10-CM

## 2019-12-19 DIAGNOSIS — I10 Essential (primary) hypertension: Secondary | ICD-10-CM | POA: Diagnosis not present

## 2019-12-19 DIAGNOSIS — E785 Hyperlipidemia, unspecified: Secondary | ICD-10-CM

## 2019-12-19 NOTE — Telephone Encounter (Signed)
Patient was seen today and states he will call to make his follow-up with Janett Billow. (Return in about 6 months (around 06/17/2020).

## 2019-12-19 NOTE — Progress Notes (Signed)
I agree with the above plan 

## 2019-12-19 NOTE — Progress Notes (Signed)
[facility] [address] [place]. [place] [address] [phone]       OFFICE FOLLOW-UP NOTE  [name] Date of Birth:  [birth year] Medical Record Number:  [id number]   Reason for visit: Stroke follow-up  Chief Complaint  Patient presents with  . Follow-up    6 mon f/u. Alone. Treatment room. Patient would like to discuss is it safe for him to take a blood thinner due to him having afib.       HPI:  Initial visit 01/31/2019 PS: 74 year old Caucasian male seen today for initial office follow-up visit following hospital admission for intracerebral hemorrhage in January 2020.  He is accompanied by his brother-in-law.  History is obtained from them and review of hospital electronic medical records.  I personally reviewed imaging films in PACS. Barry Woods is a 74 year old male with past medical history of chronic atrial fibrillation on long-term anticoagulation with Eliquis, hyperlipidemia, hypertension, rheumatoid arthritis who presented on 11/27/2018 with sudden onset of gait difficulty with his legs dragging to the left.  He was unable to walk and EMS were called.  He had left hemiplegia with facial droop and slurred speech.  NIH stroke scale on admission was 7 code stroke was activated and on initial evaluation there was concern for LVO but CT scan of the head showed right basal ganglia hemorrhage with effacement of right lateral ventricle due to mass-effect.  The patient was on Eliquis at home and this was reversed with Andexxa.  Patient actually had scheduled planned cardioversion the next week for his A. fib.  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  Follow-up CT scan showed slight increase in size of the hemorrhage up to 21 cubic cc volume from previous 17 cubic cc volume with 2 mm right left shift.  Patient was admitted to the intensive care unit where blood pressure was tightly controlled.  MRI scan of the brain subsequently confirmed right basal ganglia  hemorrhage with 2 to 3 mm right-to-left midline shift but no underlying tumor or high flow vascular malformation is noted.  There were few foci of hemosiderin noted elsewhere consistent with old prior hemorrhage.  There was also incidental small 4 mm left medial frontal subcortical white matter acute infarct.  Patient had prior history of brain cavernoma's for which he was followed by Dr. Kathyrn Sheriff neurosurgeon.  Patient neurological exam remained stable.  He had mild left hemiparesis 4/5 strength with weakness of left grip and intrinsic hand muscles.  He was seen by physical occupational and speech therapy.  He was initially started on dysphagia 3 diet and subsequently progressed.  He was transferred to inpatient rehab where he did very well and has not been discharged home.  He states he has obtained substantial improvement in his left-sided strength.  He is only slightly diminished fine motor skills in his left hand.  His left leg is little bit heavy and drags at times but he can walk independently without assistance.  He is living alone.  He denies any cognitive or memory difficulties.  He is currently on no antiplatelet agents or anticoagulants.  Update 05/24/2019 PS : He returns for follow-up after last visit 3 months ago.  He states he is doing well is not had any further stroke or TIAs.  He states his blood pressure is usually well controlled and today it is 137/82.  His dose of Tenormin was increased a few months ago from 50 to 75 mg daily.  He does complain of feeling tired  with decreased stamina and short of breath with exertion.  He does have an upcoming visit with his cardiologist Dr. Angelena Form later this week and plans to discuss this.  He was interested in participating in the [employer] stroke prevention trial but unfortunately ran out of the eligible time window due to the [place] pandemic as the trial was on hold.  He has no new neurological complaints today.  He continues to have slight stiffness of his  left leg and dragging as well as residual numbness on the left side which is unchanged.  He is tolerating aspirin 81 well without bruising or bleeding.  He does not want to go back on a stronger anticoagulant due to fear of bleeding  Update 12/19/2019: Barry Woods is a 74 year old male who is being today for stroke follow-up.  He has been stable since prior visit without new or worsening stroke/TIA symptoms. Residual deficits which have been stable include slight stiffness of left leg and mild numbness but this has been stable without worsening. He continues on aspirin 81 mg daily without bleeding or bruising.  Blood pressure today 124/76.  Lipid panel obtained on 08/10/2019 with LDL 65.  Continues on simvastatin 20 mg daily. He continues to stay active and feels as though he has greater stamina and tolerance for activity. He does report that his cardiologist wanted him to speak with [place] in regards to possibly restarting anticoagulation and discussed risk versus benefit. No other concerns at this time.    ROS:   14 system review of systems is positive for no concerns and all other systems negative  PMH:  Past Medical History:  Diagnosis Date  . Arthritis   . Cerebral cavernoma   . Hyperlipidemia   . Hypertension   . Persistent atrial fibrillation ([place])   . Rheumatoid arthritis ([place])   . Stroke [facility])     Social History:  Social History   Socioeconomic History  . Marital status: Divorced    Spouse name: Not on file  . Number of children: Not on file  . Years of education: Not on file  . Highest education level: Not on file  Occupational History  . 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 Smoking status: Never Smoker  . Smokeless tobacco: Never Used  Substance and Sexual Activity  . Alcohol use: Not Currently    Alcohol/week: 14.0 standard drinks    Types: 14 Cans of beer per week  . Drug use: No  . Sexual activity: Not on file  Other Topics Concern  . Not on file  Social  History Narrative  . Not on file   Social Determinants of Health   Financial Resource Strain:   . Difficulty of Paying Living Expenses: Not on file  Food Insecurity:   . Worried About Charity fundraiser in the Last Year: Not on file  . Ran Out of Food in the Last Year: Not on file  Transportation Needs:   . Lack of Transportation (Medical): Not on file  . Lack of Transportation (Non-Medical): Not on file  Physical Activity:   . Days of Exercise per Week: Not on file  . Minutes of Exercise per Session: Not on file  Stress:   . Feeling of Stress : Not on file  Social Connections:   . Frequency of Communication with Friends and Family: Not on file  . Frequency of Social Gatherings with Friends and Family: Not on file  . Attends Religious Services: Not on file  .  Active Member of Clubs or Organizations: Not on file  . Attends Archivist Meetings: Not on file  . Marital Status: Not on file  Intimate Partner Violence:   . Fear of Current or Ex-Partner: Not on file  . Emotionally Abused: Not on file  . Physically Abused: Not on file  . Sexually Abused: Not on file    Medications:   Current Outpatient Medications on File Prior to Visit  Medication Sig Dispense Refill  . Adalimumab (HUMIRA) 40 MG/0.8ML PSKT Inject 40 mg into the skin See admin instructions. Every other week     . amLODipine (NORVASC) 5 MG tablet Take one tablet by mouth every evening 90 tablet 3  . aspirin EC 81 MG tablet Take 1 tablet (81 mg total) by mouth daily. 90 tablet 1  . atenolol (TENORMIN) 50 MG tablet Take 1.5 tablets (75 mg total) by mouth daily. 270 tablet 3  . doxycycline (DORYX) 100 MG EC tablet Take 100 mg by mouth daily.    Marland Kitchen PREDNISONE PO Take 2.5 mg by mouth daily.     . simvastatin (ZOCOR) 20 MG tablet TAKE 1 TABLET BY MOUTH AT BEDTIME 90 tablet 3   No current facility-administered medications on file prior to visit.    Allergies:   Allergies  Allergen Reactions  . Atorvastatin  Hives  . Cetirizine Hcl Hives    Physical Exam  Today's Vitals   12/19/19 1335  BP: 124/76  Pulse: 71  Temp: (!) 96.1 F (35.6 C)  TempSrc: Oral  Weight: 160 lb (72.6 kg)  Height: 5\' 7"  (1.702 m)   Body mass index is 25.06 kg/m.  General: Frail pleasant elderly Caucasian male, seated, in no evident distress Head: head normocephalic and atraumatic.  Neck: supple with no carotid or supraclavicular bruits Cardiovascular: irregular rate and rhythm, no murmurs Musculoskeletal: no deformity Skin:  no rash/petichiae Vascular:  Normal pulses all extremities  Neurologic Exam Mental Status: Awake and fully alert. Oriented to place and time. Recent and remote memory intact. Attention span, concentration and fund of knowledge appropriate. Mood and affect appropriate.  Cranial Nerves: Pupils equal, briskly reactive to light. Extraocular movements full without nystagmus. Visual fields full to confrontation. Hearing intact. Facial sensation intact. Face, tongue, palate moves normally and symmetrically.  Motor: Normal bulk and tone. Normal strength in all tested extremity muscles except mild weakness of left ankle dorsiflexion.  Tone is increased in the left leg compared to the right.  Sensory.: intact to touch ,pinprick .position and vibratory sensation.  Coordination: Rapid alternating movements normal in all extremities. Finger-to-nose and heel-to-shin performed accurately bilaterally. Gait and Station: Arises from chair without difficulty. Stance is normal. Gait demonstrates normal stride length and slight dragging and stiffened left leg. Able to heel, toe and tandem walk with moderate difficulty.  Reflexes: 1+ and symmetric. Toes downgoing.      ASSESSMENT: 74 year old Caucasian male with right subcortical intracerebral hemorrhage related to Eliquis coagulopathy and atrial fibrillation as well as small ischemic left frontal subcortical infarct in January 2020.  He is doing remarkably well  with minimal physical deficits     PLAN: -Recommend continuation of aspirin 81 mg daily for secondary stroke prevention -would not advise to restart anticoagulation despite history of atrial fibrillation due to recent [place] and prior history of cavernoma's increasing bleeding risk and possible amyloid angiopathy.  Discussed this with patient and advised increased risk of anticoagulation would greatly outweigh benefit which was also discussed with Dr. Leonie Man. -Continue simvastatin 20  mg daily for secondary stroke prevention -Continue to follow with PCP for HTN and HLD management -Continue to follow with cardiology for atrial fibrillation monitoring and management -continue strict control of hypertension with blood pressure goal below 130/90 and lipids with LDL cholesterol goal below 70 mg percent and hemoglobin A1c goal below 6.5%..    Follow-up in 6 months or call earlier if needed  Greater than 50% of time during this 30 minute visit was spent on counseling,explanation of diagnosis of intracerebral hemorrhage, planning of further management, discussion regarding risk versus benefit of anticoagulation with history of atrial fibrillation, discussion with patient answering all questions to satisfaction  Frann Rider, AGNP-BC  [facility] [address] [place], [place] [address]  Phone [phone] Fax [phone] Note: This document was prepared with digital dictation and possible smart phrase technology. Any transcriptional errors that result from this process are unintentional.

## 2019-12-19 NOTE — Patient Instructions (Signed)
Continue aspirin 81 mg daily  and Lipitor for secondary stroke prevention  Continue to follow up with PCP regarding cholesterol and blood pressure management  Continue to follow with cardiology for atrial fibrillation monitoring management  Continue to monitor blood pressure at home  Maintain strict control of hypertension with blood pressure goal below 130/90, diabetes with hemoglobin A1c goal below 6.5% and cholesterol with LDL cholesterol (bad cholesterol) goal below 70 mg/dL. I also advised the patient to eat a healthy diet with plenty of whole grains, cereals, fruits and vegetables, exercise regularly and maintain ideal body weight.  Followup in the future with me in 6 months or call earlier if needed       Thank you for coming to see [place] at [facility]. I hope we have been able to provide you high quality care today.  You may receive a patient satisfaction survey over the next few weeks. We would appreciate your feedback and comments so that we may continue to improve ourselves and the health of our patients.

## 2020-01-18 ENCOUNTER — Ambulatory Visit: Payer: Medicare HMO | Attending: Internal Medicine

## 2020-01-18 DIAGNOSIS — Z23 Encounter for immunization: Secondary | ICD-10-CM | POA: Insufficient documentation

## 2020-01-18 NOTE — Progress Notes (Signed)
   Covid-19 Vaccination Clinic  Name:  CIMARRON PUFAHL    MRN: [id number] DOB: [birth year]  01/18/2020  Mr. Perrot was observed post Covid-19 immunization for 15 minutes without incidence. He was provided with Vaccine Information Sheet and instruction to access the V-Safe system.   Mr. Reddinger was instructed to call 911 with any severe reactions post vaccine: Marland Kitchen Difficulty breathing  . Swelling of your face and throat  . A fast heartbeat  . A bad rash all over your body  . Dizziness and weakness    Immunizations Administered    Name Date Dose VIS Date Route   Pfizer COVID-19 Vaccine 01/18/2020 11:36 AM 0.3 mL 11/03/2019 Intramuscular   Manufacturer: [place]   Lot: [id number]   [place]: [id number]

## 2020-02-13 ENCOUNTER — Ambulatory Visit: Payer: Medicare HMO | Attending: Internal Medicine

## 2020-02-13 DIAGNOSIS — Z23 Encounter for immunization: Secondary | ICD-10-CM

## 2020-02-13 NOTE — Progress Notes (Signed)
   Covid-19 Vaccination Clinic  Name:  MICO KNEIFL    MRN: [id number] DOB: 10-27-1946  02/13/2020  Mr. Bingham was observed post Covid-19 immunization for 15 minutes without incident. He was provided with Vaccine Information Sheet and instruction to access the V-Safe system.   Mr. Faver was instructed to call 911 with any severe reactions post vaccine: Marland Kitchen Difficulty breathing  . Swelling of face and throat  . A fast heartbeat  . A bad rash all over body  . Dizziness and weakness   Immunizations Administered    Name Date Dose VIS Date Route   Pfizer COVID-19 Vaccine 02/13/2020 10:44 AM 0.3 mL 11/03/2019 Intramuscular   Manufacturer: [place]   Lot: [id number]   [place]: [id number]

## 2020-02-21 DIAGNOSIS — Z6824 Body mass index (BMI) 24.0-24.9, adult: Secondary | ICD-10-CM | POA: Diagnosis not present

## 2020-02-21 DIAGNOSIS — M0589 Other rheumatoid arthritis with rheumatoid factor of multiple sites: Secondary | ICD-10-CM | POA: Diagnosis not present

## 2020-02-21 DIAGNOSIS — M25512 Pain in left shoulder: Secondary | ICD-10-CM | POA: Diagnosis not present

## 2020-02-21 DIAGNOSIS — R945 Abnormal results of liver function studies: Secondary | ICD-10-CM | POA: Diagnosis not present

## 2020-03-21 DIAGNOSIS — R69 Illness, unspecified: Secondary | ICD-10-CM | POA: Diagnosis not present

## 2020-06-22 ENCOUNTER — Other Ambulatory Visit: Payer: Self-pay | Admitting: Cardiovascular Disease

## 2020-07-19 ENCOUNTER — Telehealth: Payer: Self-pay | Admitting: Adult Health

## 2020-07-19 NOTE — Progress Notes (Signed)
  Chronic Care Management   Note  07/19/2020 Name: BRAINARD HIGHFILL MRN: [id number] DOB: October 29, 1946  OVA MEEGAN is a 74 y.o. year old male who is a primary care patient of Dorothyann Peng, NP. I reached out to Andres Labrum by phone today in response to a referral sent by Mr. Jasper Loser Deam's PCP, Dorothyann Peng, NP.   Mr. Wissmann was given information about Chronic Care Management services today including:  1. CCM service includes personalized support from designated clinical staff supervised by his physician, including individualized plan of care and coordination with other care providers 2. 24/7 contact phone numbers for assistance for urgent and routine care needs. 3. Service will only be billed when office clinical staff spend 20 minutes or more in a month to coordinate care. 4. Only one practitioner may furnish and bill the service in a calendar month. 5. The patient may stop CCM services at any time (effective at the end of the month) by phone call to the office staff.   Patient wishes to consider information provided and/or speak with a member of the care team before deciding about enrollment in care management services.   Follow up plan:   Carley Perdue [employer] Scheduler

## 2020-07-19 NOTE — Progress Notes (Signed)
  Chronic Care Management   Outreach Note  07/19/2020 Name: ONIX JUMPER MRN: [id number] DOB: 1946-10-14  Referred by: Dorothyann Peng, NP Reason for referral : No chief complaint on file.   An unsuccessful telephone outreach was attempted today. The patient was referred to the pharmacist for assistance with care management and care coordination.   Follow Up Plan:   Carley Perdue [employer] Scheduler

## 2020-08-06 DIAGNOSIS — R945 Abnormal results of liver function studies: Secondary | ICD-10-CM | POA: Diagnosis not present

## 2020-08-06 DIAGNOSIS — M25512 Pain in left shoulder: Secondary | ICD-10-CM | POA: Diagnosis not present

## 2020-08-06 DIAGNOSIS — Z6824 Body mass index (BMI) 24.0-24.9, adult: Secondary | ICD-10-CM | POA: Diagnosis not present

## 2020-08-06 DIAGNOSIS — M0589 Other rheumatoid arthritis with rheumatoid factor of multiple sites: Secondary | ICD-10-CM | POA: Diagnosis not present

## 2020-08-09 ENCOUNTER — Encounter: Payer: Self-pay | Admitting: Cardiovascular Disease

## 2020-08-09 ENCOUNTER — Ambulatory Visit: Payer: Medicare HMO | Admitting: Cardiovascular Disease

## 2020-08-09 ENCOUNTER — Other Ambulatory Visit: Payer: Self-pay

## 2020-08-09 VITALS — BP 110/68 | HR 59 | Ht 67.0 in | Wt 161.0 lb

## 2020-08-09 DIAGNOSIS — I5032 Chronic diastolic (congestive) heart failure: Secondary | ICD-10-CM

## 2020-08-09 DIAGNOSIS — E785 Hyperlipidemia, unspecified: Secondary | ICD-10-CM

## 2020-08-09 DIAGNOSIS — E7800 Pure hypercholesterolemia, unspecified: Secondary | ICD-10-CM

## 2020-08-09 DIAGNOSIS — I10 Essential (primary) hypertension: Secondary | ICD-10-CM

## 2020-08-09 DIAGNOSIS — I2510 Atherosclerotic heart disease of native coronary artery without angina pectoris: Secondary | ICD-10-CM

## 2020-08-09 DIAGNOSIS — I4819 Other persistent atrial fibrillation: Secondary | ICD-10-CM | POA: Diagnosis not present

## 2020-08-09 MED ORDER — SIMVASTATIN 20 MG PO TABS: 20.0000 mg | ORAL_TABLET | Freq: Every day | ORAL | 3 refills | Status: AC

## 2020-08-09 NOTE — Progress Notes (Signed)
Chief Complaint  Patient presents with  . Follow-up    CAD   History of Present Illness: 74 yo male with history of chronic diastolic CHF, persistent atrial fibrillation, HTN, HLD and rheumatoid arthritis here today for cardiac follow up. I saw him as a new consult for evaluation of abnormal CT scan on 04/05/17. CT chest showed coronary calcification. He had no complaints of chest pain or dyspnea. He is very active. I arranged a nuclear stress test on 04/08/17 which was normal and showed no ischemia. LV function was normal. He stopped drinking alcohlol in May 2019 and had a seizure. He had a brain scan that showed possible tumor. 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 He was found to have atrial fibrillation in December 2019 and was started on Eliquis. He was admitted with a stroke in January 2020 and was found to have an intracranial hemorrhage. Andexa was used to reverse his anti-coagulation. He was managed conservatively by Neurosurgery and was deemed to not be a candidate for future anti-coagulation.   He is here today for follow up. The patient denies any chest pain, dyspnea, palpitations, lower extremity edema, orthopnea, PND, dizziness, near syncope or syncope.   Primary Care Physician: Dorothyann Peng, NP  Past Medical History:  Diagnosis Date  . Arthritis   . Cerebral cavernoma   . Hyperlipidemia   . Hypertension   . Persistent atrial fibrillation ([place])   . Rheumatoid arthritis ([place])   . Stroke [facility])     Past Surgical History:  Procedure Laterality Date  . COLONOSCOPY    . LIPOMA EXCISION    . PALATE / UVULA BIOPSY / EXCISION    . TONSILLECTOMY     age 78    Current Outpatient Medications  Medication Sig Dispense Refill  . Adalimumab (HUMIRA) 40 MG/0.8ML PSKT Inject 40 mg into the skin See admin instructions. Every other week     . amLODipine (NORVASC) 5 MG tablet TAKE 1 TABLET BY MOUTH ONCE DAILY IN THE EVENING 90 tablet 1  . aspirin EC 81  MG tablet Take 1 tablet (81 mg total) by mouth daily. 90 tablet 1  . atenolol (TENORMIN) 50 MG tablet Take 1.5 tablets (75 mg total) by mouth daily. 270 tablet 3  . doxycycline (DORYX) 100 MG EC tablet Take 100 mg by mouth daily.    . predniSONE (DELTASONE) 5 MG tablet Take 5 mg by mouth daily.    . simvastatin (ZOCOR) 20 MG tablet Take 1 tablet (20 mg total) by mouth at bedtime. 90 tablet 3   No current facility-administered medications for this visit.    Allergies  Allergen Reactions  . Atorvastatin Hives  . Cetirizine Hcl Hives    Social History   Socioeconomic History  . Marital status: Divorced    Spouse name: Not on file  . Number of children: Not on file  . Years of education: Not on file  . Highest education level: Not on file  Occupational History  . 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 Smoking status: Never Smoker  . Smokeless tobacco: Never Used  Vaping Use  . Vaping Use: Never used  Substance and Sexual Activity  . Alcohol use: Not Currently    Alcohol/week: 14.0 standard drinks    Types: 14 Cans of beer per week  . Drug use: No  . Sexual activity: Not on file  Other Topics Concern  . Not on file  Social History  Narrative  . Not on file   Social Determinants of Health   Financial Resource Strain:   . Difficulty of Paying Living Expenses: Not on file  Food Insecurity:   . Worried About Charity fundraiser in the Last Year: Not on file  . Ran Out of Food in the Last Year: Not on file  Transportation Needs:   . Lack of Transportation (Medical): Not on file  . Lack of Transportation (Non-Medical): Not on file  Physical Activity:   . Days of Exercise per Week: Not on file  . Minutes of Exercise per Session: Not on file  Stress:   . Feeling of Stress : Not on file  Social Connections:   . Frequency of Communication with Friends and Family: Not on file  . Frequency of Social Gatherings with Friends and Family: Not on file  . Attends Religious  Services: Not on file  . Active Member of Clubs or Organizations: Not on file  . Attends Archivist Meetings: Not on file  . Marital Status: Not on file  Intimate Partner Violence:   . Fear of Current or Ex-Partner: Not on file  . Emotionally Abused: Not on file  . Physically Abused: Not on file  . Sexually Abused: Not on file    Family History  Problem Relation Age of Onset  . Colon cancer Mother 52       57  . Hepatitis C Father   . CAD Father     Review of Systems:  As stated in the HPI and otherwise negative.   BP 110/68   Pulse (!) 59   Ht 5\' 7"  (1.702 m)   Wt 161 lb (73 kg)   SpO2 95%   BMI 25.22 kg/m   Physical Examination:  General: Well developed, well nourished, NAD  HEENT: OP clear, mucus membranes moist  SKIN: warm, dry. No rashes. Neuro: No focal deficits  Musculoskeletal: Muscle strength 5/5 all ext  Psychiatric: Mood and affect normal  Neck: No JVD, no carotid bruits, no thyromegaly, no lymphadenopathy.  Lungs:Clear bilaterally, no wheezes, rhonci, crackles Cardiovascular: Regular rate and rhythm. No murmurs, gallops or rubs. Abdomen:Soft. Bowel sounds present. Non-tender.  Extremities: No lower extremity edema. Pulses are 2 + in the bilateral DP/PT.  Echo December 2019: - Left ventricle: The cavity size was normal. Wall thickness was   normal. Systolic function was normal. The estimated ejection   fraction was in the range of 60% to 65%. Incoordinate septal   motion. The study is not technically sufficient to allow   evaluation of LV diastolic function. - Mitral valve: Mildly thickened leaflets . There was trivial   regurgitation. - Left atrium: Moderately dilated. - Right ventricle: The cavity size was mildly dilated. - Right atrium: Moderately dilated. - Tricuspid valve: There was moderate regurgitation. - Pulmonary arteries: PA peak pressure: 70 mm Hg (S). - Inferior vena cava: The vessel was dilated. The respirophasic   diameter  changes were blunted (< 50%), consistent with elevated   central venous pressure.  Impressions:  - LVEF 60-65%, normal wall thickness, incoordinate septal motion,   trivial MR, moderate biatrial enlargement, moderate TR, RVSP 70   mmHg, dilated IVC.  Nuclear stress May 2018:  Nuclear stress EF: 71%.  There was no ST segment deviation noted during stress.  The study is normal.  This is a low risk study.  The left ventricular ejection fraction is hyperdynamic (>65%).   Normal pharmacologic nuclear study with no evidence  of prior infarct or ischemia.  EKG:  EKG is not ordered today.  The EKG today demonstrates   Recent Labs: No results found for requested labs within last 8760 hours.   Lipid Panel    Component Value Date/Time   CHOL 138 08/10/2019 0932   TRIG 72.0 08/10/2019 0932   TRIG 150 (H) 09/28/2006 0957   HDL 58.60 08/10/2019 0932   CHOLHDL 2 08/10/2019 0932   VLDL 14.4 08/10/2019 0932   LDLCALC 65 08/10/2019 0932   LDLDIRECT 126.5 01/18/2013 0807     Wt Readings from Last 3 Encounters:  08/09/20 161 lb (73 kg)  12/19/19 160 lb (72.6 kg)  11/30/19 162 lb 3.2 oz (73.6 kg)     Other studies Reviewed: Additional studies/ records that were reviewed today include: . Review of the above records demonstrates:   Assessment and Plan:   1. CAD without angina: He has coronary calcification noted on CT chest. Nuclear stress test May 2018 with no ischemia. No chest pain. Continue ASA, beta blocker and statin.     2. Atrial fibrillation,persistent: Rate controlled atrial fib. Not a candidate for long term anti-coagulation given prior intracranial hemorrhage while on a DOAC. Will continue ASA and beta blocker  3. HTN: BP is controlled. Continue current therapy  4. HLD: LDL 65 September 2020. Continue statin  5. Chronic diastolic CHF: No volume overload. Lasix prn  Current medicines are reviewed at length with the patient today.  The patient does not have  concerns regarding medicines.  The following changes have been made:  no change  Labs/ tests ordered today include:   No orders of the defined types were placed in this encounter.  Disposition:   FU with me in 6 months.   Signed, Lauree Chandler, MD 08/09/2020 11:12 AM    [address], [place],   [address] Phone: [phone]; Fax: [phone]

## 2020-08-09 NOTE — Patient Instructions (Signed)

## 2020-08-14 ENCOUNTER — Ambulatory Visit (INDEPENDENT_AMBULATORY_CARE_PROVIDER_SITE_OTHER): Payer: Medicare HMO | Admitting: Adult Health

## 2020-08-14 ENCOUNTER — Other Ambulatory Visit: Payer: Self-pay

## 2020-08-14 ENCOUNTER — Encounter: Payer: Self-pay | Admitting: Adult Health

## 2020-08-14 VITALS — BP 120/80 | HR 55 | Temp 98.8°F | Ht 67.0 in | Wt 163.4 lb

## 2020-08-14 DIAGNOSIS — I10 Essential (primary) hypertension: Secondary | ICD-10-CM | POA: Diagnosis not present

## 2020-08-14 DIAGNOSIS — Z131 Encounter for screening for diabetes mellitus: Secondary | ICD-10-CM | POA: Diagnosis not present

## 2020-08-14 DIAGNOSIS — I482 Chronic atrial fibrillation, unspecified: Secondary | ICD-10-CM

## 2020-08-14 DIAGNOSIS — I619 Nontraumatic intracerebral hemorrhage, unspecified: Secondary | ICD-10-CM

## 2020-08-14 DIAGNOSIS — M069 Rheumatoid arthritis, unspecified: Secondary | ICD-10-CM | POA: Diagnosis not present

## 2020-08-14 DIAGNOSIS — R351 Nocturia: Secondary | ICD-10-CM

## 2020-08-14 DIAGNOSIS — E7800 Pure hypercholesterolemia, unspecified: Secondary | ICD-10-CM

## 2020-08-14 DIAGNOSIS — Z0000 Encounter for general adult medical examination without abnormal findings: Secondary | ICD-10-CM

## 2020-08-14 DIAGNOSIS — N401 Enlarged prostate with lower urinary tract symptoms: Secondary | ICD-10-CM

## 2020-08-14 NOTE — Progress Notes (Signed)
Subjective:    Patient ID: Barry Woods, male    DOB: 04/20/1946, 74 y.o.   MRN: [id number]  HPI Patient presents for yearly preventative medicine examination. He is a pleasant 74 year old male who  has a past medical history of Arthritis, Cerebral cavernoma, Hyperlipidemia, Hypertension, Persistent atrial fibrillation ([place]), Rheumatoid arthritis ([place]), and Stroke ([place]).  H/o CVA -intracerebral hemorrhage in January 2020.  He is followed by neurology.  He has done well since his CVA and has had no further strokelike symptoms.  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  Currently taking aspirin 81 mg.  Chronic atrial fibrillation-is followed by cardiology.  Due to stroke he is not a candidate for long-term anticoagulation.  He is rate controlled with atenolol 75 mg daily  Hyperlipidemia-currently prescribed simvastatin 20 mg daily.  Denies myalgia or fatigue Lab Results  Component Value Date   CHOL 138 08/10/2019   HDL 58.60 08/10/2019   LDLCALC 65 08/10/2019   LDLDIRECT 126.5 01/18/2013   TRIG 72.0 08/10/2019   CHOLHDL 2 08/10/2019   Essential hypertension-currently prescribed Norvasc 5 mg and atenolol 75 mg daily.  BP Readings from Last 3 Encounters:  08/14/20 120/80  08/09/20 110/68  12/19/19 124/76   Rheumatoid arthritis-followed by rheumatology.  Currently prescribed Humira 40 mg subcu every other week and prednisone 5 mg daily. He feels as though his symptoms are well controlled.   BPH-asymptomatic at this time  All immunizations and health maintenance protocols were reviewed with the patient and needed orders were placed.  Appropriate screening laboratory values were ordered for the patient including screening of hyperlipidemia, renal function and hepatic function. If indicated by BPH, a PSA was ordered.  Medication reconciliation,  past medical history, social history, problem list and allergies were reviewed  in detail with the patient  Goals were established with regard to weight loss, exercise, and  diet in compliance with medications  End of life planning was discussed.  Review of Systems  Constitutional: Negative.   HENT: Negative.   Eyes: Negative.   Respiratory: Negative.   Cardiovascular: Negative.   Gastrointestinal: Negative.   Endocrine: Negative.   Genitourinary: Negative.   Musculoskeletal: Negative.   Skin: Negative.   Allergic/Immunologic: Negative.   Neurological: Positive for numbness.  Hematological: Negative.   Psychiatric/Behavioral: Negative.   All other systems reviewed and are negative.  Past Medical History:  Diagnosis Date  . Arthritis   . Cerebral cavernoma   . Hyperlipidemia   . Hypertension   . Persistent atrial fibrillation ([place])   . Rheumatoid arthritis ([place])   . Stroke [facility])     Social History   Socioeconomic History  . Marital status: Divorced    Spouse name: Not on file  . Number of children: Not on file  . Years of education: Not on file  . Highest education level: Not on file  Occupational History  . 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 Smoking status: Never Smoker  . Smokeless tobacco: Never Used  Vaping Use  . Vaping Use: Never used  Substance and Sexual Activity  . Alcohol use: Not Currently    Alcohol/week: 14.0 standard drinks    Types: 14 Cans of beer per week  . Drug use: No  . Sexual activity: Not on file  Other Topics Concern  . Not on file  Social History Narrative  . Not on file   Social Determinants of Health  Financial Resource Strain:   . Difficulty of Paying Living Expenses: Not on file  Food Insecurity:   . Worried About Charity fundraiser in the Last Year: Not on file  . Ran Out of Food in the Last Year: Not on file  Transportation Needs:   . Lack of Transportation (Medical): Not on file  . Lack of Transportation (Non-Medical): Not on file  Physical Activity:   . Days of Exercise per Week:  Not on file  . Minutes of Exercise per Session: Not on file  Stress:   . Feeling of Stress : Not on file  Social Connections:   . Frequency of Communication with Friends and Family: Not on file  . Frequency of Social Gatherings with Friends and Family: Not on file  . Attends Religious Services: Not on file  . Active Member of Clubs or Organizations: Not on file  . Attends Archivist Meetings: Not on file  . Marital Status: Not on file  Intimate Partner Violence:   . Fear of Current or Ex-Partner: Not on file  . Emotionally Abused: Not on file  . Physically Abused: Not on file  . Sexually Abused: Not on file    Past Surgical History:  Procedure Laterality Date  . COLONOSCOPY    . LIPOMA EXCISION    . PALATE / UVULA BIOPSY / EXCISION    . TONSILLECTOMY     age 86    Family History  Problem Relation Age of Onset  . Colon cancer Mother 63       57  . Hepatitis C Father   . CAD Father     Allergies  Allergen Reactions  . Atorvastatin Hives  . Cetirizine Hcl Hives    Current Outpatient Medications on File Prior to Visit  Medication Sig Dispense Refill  . Adalimumab (HUMIRA) 40 MG/0.8ML PSKT Inject 40 mg into the skin See admin instructions. Every other week     . amLODipine (NORVASC) 5 MG tablet TAKE 1 TABLET BY MOUTH ONCE DAILY IN THE EVENING 90 tablet 1  . aspirin EC 81 MG tablet Take 1 tablet (81 mg total) by mouth daily. 90 tablet 1  . atenolol (TENORMIN) 50 MG tablet Take 1.5 tablets (75 mg total) by mouth daily. 270 tablet 3  . predniSONE (DELTASONE) 5 MG tablet Take 5 mg by mouth daily.    . simvastatin (ZOCOR) 20 MG tablet Take 1 tablet (20 mg total) by mouth at bedtime. 90 tablet 3   No current facility-administered medications on file prior to visit.    BP 120/80 (BP Location: Left Arm, Patient Position: Sitting, Cuff Size: Normal)   Pulse (!) 55   Temp 98.8 F (37.1 C) (Oral)   Ht _0  (1.702 m)   Wt 163 lb 6.4 oz (74.1 kg)   SpO2 96%   BMI  25.59 kg/m       Objective:   Physical Exam Vitals and nursing note reviewed.  Constitutional:      General: He is not in acute distress.    Appearance: Normal appearance. He is well-developed and normal weight.  HENT:     Head: Normocephalic and atraumatic.     Right Ear: Tympanic membrane, ear canal and external ear normal. There is no impacted cerumen.     Left Ear: Tympanic membrane, ear canal and external ear normal. There is no impacted cerumen.     Nose: Nose normal. No congestion or rhinorrhea.     Mouth/Throat:  Mouth: Mucous membranes are moist.     Pharynx: Oropharynx is clear. No oropharyngeal exudate or posterior oropharyngeal erythema.  Eyes:     General:        Right eye: No discharge.        Left eye: No discharge.     Extraocular Movements: Extraocular movements intact.     Conjunctiva/sclera: Conjunctivae normal.     Pupils: Pupils are equal, round, and reactive to light.  Neck:     Vascular: No carotid bruit.     Trachea: No tracheal deviation.  Cardiovascular:     Rate and Rhythm: Normal rate. Rhythm irregularly irregular.     Pulses: Normal pulses.     Heart sounds: Normal heart sounds. No murmur heard.  No friction rub. No gallop.   Pulmonary:     Effort: Pulmonary effort is normal. No respiratory distress.     Breath sounds: Normal breath sounds. No stridor. No wheezing, rhonchi or rales.  Chest:     Chest wall: No tenderness.  Abdominal:     General: Bowel sounds are normal. There is no distension.     Palpations: Abdomen is soft. There is no mass.     Tenderness: There is no abdominal tenderness. There is no right CVA tenderness, left CVA tenderness, guarding or rebound.     Hernia: No hernia is present.  Musculoskeletal:        General: No swelling, tenderness, deformity or signs of injury. Normal range of motion.     Right lower leg: No edema.     Left lower leg: No edema.  Lymphadenopathy:     Cervical: No cervical adenopathy.  Skin:     General: Skin is warm and dry.     Capillary Refill: Capillary refill takes less than 2 seconds.     Coloration: Skin is not jaundiced or pale.     Findings: No bruising, erythema, lesion or rash.  Neurological:     General: No focal deficit present.     Mental Status: He is alert and oriented to person, place, and time.     Cranial Nerves: No cranial nerve deficit.     Sensory: No sensory deficit.     Motor: No weakness.     Coordination: Coordination normal.     Gait: Gait normal.     Deep Tendon Reflexes: Reflexes normal.  Psychiatric:        Mood and Affect: Mood normal.        Behavior: Behavior normal.        Thought Content: Thought content normal.        Judgment: Judgment normal.       Assessment & Plan:  1. Routine general medical examination at a health care facility - Continue to stay active and exercise - Follow up in one year or sooner if needed - CBC with Differential/Platelet; Future - Hemoglobin A1c; Future - Lipid panel; Future - TSH; Future - CMP with eGFR([facility]); Future  2. Pure hypercholesterolemia - Consider change in statin dose  - CBC with Differential/Platelet; Future - Hemoglobin A1c; Future - Lipid panel; Future - TSH; Future - CMP with eGFR([facility]); Future  3. Hemorrhagic stroke ([place]) - Continue follow up with cardiology and neurology  - CBC with Differential/Platelet; Future - Hemoglobin A1c; Future - Lipid panel; Future - TSH; Future - CMP with eGFR([facility]); Future  4. Chronic atrial fibrillation ([facility]) - Continue follow up with cardiology  - CBC with Differential/Platelet; Future - Hemoglobin A1c; Future -  Lipid panel; Future - TSH; Future - CMP with eGFR([facility]); Future  5. Essential hypertension - Well controlled. No change in medications  - CBC with Differential/Platelet; Future - Hemoglobin A1c; Future - Lipid panel; Future - TSH; Future - CMP with eGFR([facility]); Future  6. BPH associated with nocturia  - PSA; Future  7.  Rheumatoid arthritis, involving unspecified site, unspecified whether rheumatoid factor present ([place]) - Continue with plan of care by rheumatology   Dorothyann Peng, NP

## 2020-08-14 NOTE — Addendum Note (Signed)
Addended by: Marrion Coy on: 08/14/2020 10:00 AM   Modules accepted: Orders

## 2020-08-14 NOTE — Patient Instructions (Signed)
It was great seeing you today   We will follow up with you regarding your blood work   I will see you back in one year

## 2020-08-15 ENCOUNTER — Ambulatory Visit: Payer: Medicare HMO | Admitting: Adult Health

## 2020-08-15 LAB — COMPLETE METABOLIC PANEL WITH GFR
AG Ratio: 1 (calc) (ref 1.0–2.5)
ALT: 80 U/L — ABNORMAL HIGH (ref 9–46)
AST: 62 U/L — ABNORMAL HIGH (ref 10–35)
Albumin: 4.1 g/dL (ref 3.6–5.1)
Alkaline phosphatase (APISO): 49 U/L (ref 35–144)
BUN: 12 mg/dL (ref 7–25)
CO2: 28 mmol/L (ref 20–32)
Calcium: 9.4 mg/dL (ref 8.6–10.3)
Chloride: 103 mmol/L (ref 98–110)
Creat: 1.06 mg/dL (ref 0.70–1.18)
GFR, Est African American: 81 mL/min/{1.73_m2} (ref >=60)
GFR, Est Non African American: 70 mL/min/{1.73_m2} (ref >=60)
Globulin: 4.2 g/dL (calc) — ABNORMAL HIGH (ref 1.9–3.7)
Glucose, Bld: 101 mg/dL — ABNORMAL HIGH (ref 65–99)
Potassium: 4.9 mmol/L (ref 3.5–5.3)
Sodium: 138 mmol/L (ref 135–146)
Total Bilirubin: 1.2 mg/dL (ref 0.2–1.2)
Total Protein: 8.3 g/dL — ABNORMAL HIGH (ref 6.1–8.1)

## 2020-08-15 LAB — CBC WITH DIFFERENTIAL/PLATELET
Absolute Monocytes: 690 cells/uL (ref 200–950)
Basophils Absolute: 30 cells/uL (ref 0–200)
Basophils Relative: 0.6 %
Eosinophils Absolute: 140 cells/uL (ref 15–500)
Eosinophils Relative: 2.8 %
HCT: 47.8 % (ref 38.5–50.0)
Hemoglobin: 16 g/dL (ref 13.2–17.1)
Lymphs Abs: 1220 cells/uL (ref 850–3900)
MCH: 31.5 pg (ref 27.0–33.0)
MCHC: 33.5 g/dL (ref 32.0–36.0)
MCV: 94.1 fL (ref 80.0–100.0)
MPV: 10.5 fL (ref 7.5–12.5)
Monocytes Relative: 13.8 %
Neutro Abs: 2920 cells/uL (ref 1500–7800)
Neutrophils Relative %: 58.4 %
Platelets: 184 10*3/uL (ref 140–400)
RBC: 5.08 10*6/uL (ref 4.20–5.80)
RDW: 12.7 % (ref 11.0–15.0)
Total Lymphocyte: 24.4 %
WBC: 5 10*3/uL (ref 3.8–10.8)

## 2020-08-15 LAB — HEMOGLOBIN A1C
Hgb A1c MFr Bld: 5.5 % of total Hgb (ref <5.7)
Mean Plasma Glucose: 111 (calc)
eAG (mmol/L): 6.2 (calc)

## 2020-08-15 LAB — LIPID PANEL
Cholesterol: 153 mg/dL (ref <200)
HDL: 68 mg/dL (ref >=40)
LDL Cholesterol (Calc): 69 mg/dL (calc)
Non-HDL Cholesterol (Calc): 85 mg/dL (calc) (ref <130)
Total CHOL/HDL Ratio: 2.3 (calc) (ref <5.0)
Triglycerides: 76 mg/dL (ref <150)

## 2020-08-15 LAB — PSA: PSA: 2.47 ng/mL (ref <=4.0)

## 2020-08-15 LAB — TSH: TSH: 2.75 mIU/L (ref 0.40–4.50)

## 2020-08-20 ENCOUNTER — Ambulatory Visit: Payer: Medicare HMO | Admitting: Adult Health

## 2020-08-20 ENCOUNTER — Encounter: Payer: Self-pay | Admitting: Adult Health

## 2020-08-20 VITALS — BP 108/60 | HR 57 | Ht 67.0 in | Wt 164.0 lb

## 2020-08-20 DIAGNOSIS — I480 Paroxysmal atrial fibrillation: Secondary | ICD-10-CM | POA: Diagnosis not present

## 2020-08-20 DIAGNOSIS — I610 Nontraumatic intracerebral hemorrhage in hemisphere, subcortical: Secondary | ICD-10-CM | POA: Diagnosis not present

## 2020-08-20 NOTE — Patient Instructions (Signed)
Continue aspirin 81 mg daily  and simvastatin 20 mg daily for secondary stroke prevention  Continue to follow up with PCP and cardiology regarding cholesterol and blood pressure management  Maintain strict control of hypertension with blood pressure goal below 130/90 and cholesterol with LDL cholesterol (bad cholesterol) goal below 70 mg/dL.          Thank you for coming to see [place] at [facility]. I hope we have been able to provide you high quality care today.  You may receive a patient satisfaction survey over the next few weeks. We would appreciate your feedback and comments so that we may continue to improve ourselves and the health of our patients.

## 2020-08-20 NOTE — Progress Notes (Signed)
[facility] [address] [place]. [place] [address] [phone]       OFFICE FOLLOW-UP NOTE  [name] Date of Birth:  12/30/1945 Medical Record Number:  [id number]   Reason for visit: Stroke follow-up   Chief complaint: Stroke    HPI:   Today, 9/98/2021, Barry Woods returns for stroke follow-up.  He has been doing well from a stroke standpoint since prior visit with residual left-sided sensory impairment.  Denies new or worsening stroke/TIA symptoms.  Remains on aspirin 81 mg daily and simvastatin 20 mg daily without side effects.  Blood pressure today 108/60.  No concerns at this time.     History provided for reference purposes only Update 12/19/2019: Barry Woods is a 74 year old male who is being today for stroke follow-up.  He has been stable since prior visit without new or worsening stroke/TIA symptoms. Residual deficits which have been stable include slight stiffness of left leg and mild numbness but this has been stable without worsening. He continues on aspirin 81 mg daily without bleeding or bruising.  Blood pressure today 124/76.  Lipid panel obtained on 08/10/2019 with LDL 65.  Continues on simvastatin 20 mg daily. He continues to stay active and feels as though he has greater stamina and tolerance for activity. He does report that his cardiologist wanted him to speak with [place] in regards to possibly restarting anticoagulation and discussed risk versus benefit. No other concerns at this time.  Update 05/24/2019 PS : He returns for follow-up after last visit 3 months ago.  He states he is doing well is not had any further stroke or TIAs.  He states his blood pressure is usually well controlled and today it is 137/82.  His dose of Tenormin was increased a few months ago from 50 to 75 mg daily.  He does complain of feeling tired with decreased stamina and short of breath with exertion.  He does have an upcoming visit with his cardiologist Dr.  Angelena Form later this week and plans to discuss this.  He was interested in participating in the [employer] stroke prevention trial but unfortunately ran out of the eligible time window due to the [place] pandemic as the trial was on hold.  He has no new neurological complaints today.  He continues to have slight stiffness of his left leg and dragging as well as residual numbness on the left side which is unchanged.  He is tolerating aspirin 81 well without bruising or bleeding.  He does not want to go back on a stronger anticoagulant due to fear of bleeding  Initial visit 01/31/2019 PS: 74 year old Caucasian male seen today for initial office follow-up visit following hospital admission for intracerebral hemorrhage in January 2020.  He is accompanied by his brother-in-law.  History is obtained from them and review of hospital electronic medical records.  I personally reviewed imaging films in PACS. Barry Woods is a 74 year old male with past medical history of chronic atrial fibrillation on long-term anticoagulation with Eliquis, hyperlipidemia, hypertension, rheumatoid arthritis who presented on 11/27/2018 with sudden onset of gait difficulty with his legs dragging to the left.  He was unable to walk and EMS were called.  He had left hemiplegia with facial droop and slurred speech.  NIH stroke scale on admission was 7 code stroke was activated and on initial evaluation there was concern for LVO but CT scan of the head showed right basal ganglia hemorrhage with effacement of right lateral ventricle due to mass-effect.  The patient  was on Eliquis at home and this was reversed with Andexxa.  Patient actually had scheduled planned cardioversion the next week for his A. fib.  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  Follow-up CT scan showed slight increase in size of the hemorrhage up to 21 cubic cc volume from previous 17 cubic cc volume with 2 mm right left shift.  Patient was admitted to the intensive care unit where blood  pressure was tightly controlled.  MRI scan of the brain subsequently confirmed right basal ganglia hemorrhage with 2 to 3 mm right-to-left midline shift but no underlying tumor or high flow vascular malformation is noted.  There were few foci of hemosiderin noted elsewhere consistent with old prior hemorrhage.  There was also incidental small 4 mm left medial frontal subcortical white matter acute infarct.  Patient had prior history of brain cavernoma's for which he was followed by Dr. Kathyrn Woods neurosurgeon.  Patient neurological exam remained stable.  He had mild left hemiparesis 4/5 strength with weakness of left grip and intrinsic hand muscles.  He was seen by physical occupational and speech therapy.  He was initially started on dysphagia 3 diet and subsequently progressed.  He was transferred to inpatient rehab where he did very well and has not been discharged home.  He states he has obtained substantial improvement in his left-sided strength.  He is only slightly diminished fine motor skills in his left hand.  His left leg is little bit heavy and drags at times but he can walk independently without assistance.  He is living alone.  He denies any cognitive or memory difficulties.  He is currently on no antiplatelet agents or anticoagulants.    ROS:   14 system review of systems is positive for no concerns and all other systems negative  PMH:  Past Medical History:  Diagnosis Date  . Arthritis   . Cerebral cavernoma   . Hyperlipidemia   . Hypertension   . Persistent atrial fibrillation ([place])   . Rheumatoid arthritis ([place])   . Stroke [facility])     Social History:  Social History   Socioeconomic History  . Marital status: Divorced    Spouse name: Not on file  . Number of children: Not on file  . Years of education: Not on file  . Highest education level: Not on file  Occupational History  . 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 Smoking status: Never Smoker  . Smokeless  tobacco: Never Used  Vaping Use  . Vaping Use: Never used  Substance and Sexual Activity  . Alcohol use: Not Currently    Alcohol/week: 14.0 standard drinks    Types: 14 Cans of beer per week  . Drug use: No  . Sexual activity: Not on file  Other Topics Concern  . Not on file  Social History Narrative  . Not on file   Social Determinants of Health   Financial Resource Strain:   . Difficulty of Paying Living Expenses: Not on file  Food Insecurity:   . Worried About Charity fundraiser in the Last Year: Not on file  . Ran Out of Food in the Last Year: Not on file  Transportation Needs:   . Lack of Transportation (Medical): Not on file  . Lack of Transportation (Non-Medical): Not on file  Physical Activity:   . Days of Exercise per Week: Not on file  . Minutes of Exercise per Session: Not on file  Stress:   . Feeling of Stress :  Not on file  Social Connections:   . Frequency of Communication with Friends and Family: Not on file  . Frequency of Social Gatherings with Friends and Family: Not on file  . Attends Religious Services: Not on file  . Active Member of Clubs or Organizations: Not on file  . Attends Archivist Meetings: Not on file  . Marital Status: Not on file  Intimate Partner Violence:   . Fear of Current or Ex-Partner: Not on file  . Emotionally Abused: Not on file  . Physically Abused: Not on file  . Sexually Abused: Not on file    Medications:   Current Outpatient Medications on File Prior to Visit  Medication Sig Dispense Refill  . Adalimumab (HUMIRA) 40 MG/0.8ML PSKT Inject 40 mg into the skin See admin instructions. Every other week     . amLODipine (NORVASC) 5 MG tablet TAKE 1 TABLET BY MOUTH ONCE DAILY IN THE EVENING 90 tablet 1  . aspirin EC 81 MG tablet Take 1 tablet (81 mg total) by mouth daily. 90 tablet 1  . atenolol (TENORMIN) 50 MG tablet Take 1.5 tablets (75 mg total) by mouth daily. 270 tablet 3  . predniSONE (DELTASONE) 5 MG tablet  Take 5 mg by mouth daily.    . simvastatin (ZOCOR) 20 MG tablet Take 1 tablet (20 mg total) by mouth at bedtime. 90 tablet 3   No current facility-administered medications on file prior to visit.    Allergies:   Allergies  Allergen Reactions  . Atorvastatin Hives  . Cetirizine Hcl Hives    Physical Exam  Today's Vitals   08/20/20 0836  BP: 108/60  Pulse: (!) 57  Weight: 164 lb (74.4 kg)  Height: 5\' 7"  (1.702 m)   Body mass index is 25.69 kg/m.  General: Well-developed pleasant elderly Caucasian male, seated, in no evident distress Head: head normocephalic and atraumatic.  Neck: supple with no carotid or supraclavicular bruits Cardiovascular: irregular rate and rhythm, no murmurs Musculoskeletal: no deformity Skin:  no rash/petichiae Vascular:  Normal pulses all extremities  Neurologic Exam Mental Status: Awake and fully alert.  Fluent speech and language.  Oriented to place and time. Recent and remote memory intact. Attention span, concentration and fund of knowledge appropriate. Mood and affect appropriate.  Cranial Nerves: Pupils equal, briskly reactive to light. Extraocular movements full without nystagmus. Visual fields full to confrontation. Hearing intact. Facial sensation intact. Face, tongue, palate moves normally and symmetrically.  Motor: Normal bulk and tone. Normal strength in all tested extremity muscles. Tone is increased in the left leg compared to the right.  Sensory.: intact to touch ,pinprick .position and vibratory sensation.  Altered temperature sensation LUE and LLE Coordination: Rapid alternating movements normal in all extremities. Finger-to-nose and heel-to-shin performed accurately bilaterally. Gait and Station: Arises from chair without difficulty. Stance is normal. Gait demonstrates normal stride length and slight dragging and stiffened left leg. Able to heel, toe and tandem walk with moderate difficulty.  Reflexes: 1+ and symmetric. Toes downgoing.        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     R ICH -Residual left sensory impairment -stable -Continue aspirin 81 mg daily and simvastatin 20 mg daily for secondary stroke prevention -Recent LDL 69 (08/14/2020) -Discussed importance of close PCP follow-up for aggressive stroke risk factor management and maintain strict control of hypertension with  blood pressure goal below 130/90 and lipids with LDL cholesterol goal below 70 mg percent and hemoglobin A1c goal below 6.5%.  ATRIAL FIBRILLATION -Previously on Eliquis but unfortunately not an anticoagulation candidate due to [place] while on DOAC therapy -risk outweighs benefit -Continue follow-up with cardiology for monitoring and management   Overall stable from stroke standpoint and recommend follow-up on an as-needed basis   I spent 20 minutes of face-to-face and non-face-to-face time with patient.  This included previsit chart review, lab review, study review, order entry, electronic health record documentation, patient education and discussion regarding prior right ICH, residual deficits, importance of managing stroke risk factors, and answered all questions to patient satisfaction   Frann Rider, [facility]  [facility] [address] [place], [place] [address]  Phone [phone] Fax [phone] Note: This document was prepared with digital dictation and possible smart phrase technology. Any transcriptional errors that result from this process are unintentional.

## 2020-08-21 NOTE — Progress Notes (Signed)
I agree with the above plan 

## 2020-09-03 DIAGNOSIS — H52203 Unspecified astigmatism, bilateral: Secondary | ICD-10-CM | POA: Diagnosis not present

## 2020-09-03 DIAGNOSIS — H2513 Age-related nuclear cataract, bilateral: Secondary | ICD-10-CM | POA: Diagnosis not present

## 2020-09-29 ENCOUNTER — Other Ambulatory Visit: Payer: Self-pay | Admitting: Cardiovascular Disease

## 2020-10-02 DIAGNOSIS — R69 Illness, unspecified: Secondary | ICD-10-CM | POA: Diagnosis not present

## 2020-10-05 ENCOUNTER — Ambulatory Visit: Payer: Medicare HMO | Attending: Internal Medicine

## 2020-10-05 DIAGNOSIS — Z23 Encounter for immunization: Secondary | ICD-10-CM

## 2020-10-05 NOTE — Progress Notes (Signed)
   Covid-19 Vaccination Clinic  Name:  Barry Woods    MRN: [id number] DOB: 23-Aug-1946  10/05/2020  [name] was observed post Covid-19 immunization for 15 minutes without incident. He was provided with Vaccine Information Sheet and instruction to access the V-Safe system.   [name] was instructed to call 911 with any severe reactions post vaccine: Marland Kitchen Difficulty breathing  . Swelling of face and throat  . A fast heartbeat  . A bad rash all over body  . Dizziness and weakness

## 2020-11-14 ENCOUNTER — Ambulatory Visit: Payer: Medicare HMO | Admitting: Adult Health

## 2020-11-19 ENCOUNTER — Telehealth: Payer: Self-pay | Admitting: Adult Health

## 2020-11-19 NOTE — Telephone Encounter (Signed)
Spoke with pt to  schedule Medicare Annual Wellness Visit (AWV) either virtually or in office.  Pt stated he would thinks about it and call back to schedule  Last AWV ; please schedule at anytime with [facility] Nurse Health Advisor 1 or 2   This should be a 45 minute visit.

## 2020-12-18 ENCOUNTER — Other Ambulatory Visit: Payer: Self-pay | Admitting: Cardiovascular Disease

## 2021-01-06 ENCOUNTER — Encounter: Payer: Self-pay | Admitting: Gastroenterology

## 2021-01-14 ENCOUNTER — Telehealth: Payer: Self-pay | Admitting: Adult Health

## 2021-01-14 NOTE — Telephone Encounter (Signed)
Left message for patient to call back and schedule Medicare Annual Wellness Visit (AWV) either virtually or in office. No detailed information left    Last AWV I 02/16/17  please schedule at anytime with [facility] Nurse Health Advisor 1 or 2   This should be a 45 minute visit.

## 2021-02-03 DIAGNOSIS — R945 Abnormal results of liver function studies: Secondary | ICD-10-CM | POA: Diagnosis not present

## 2021-02-03 DIAGNOSIS — M0589 Other rheumatoid arthritis with rheumatoid factor of multiple sites: Secondary | ICD-10-CM | POA: Diagnosis not present

## 2021-02-03 DIAGNOSIS — M25512 Pain in left shoulder: Secondary | ICD-10-CM | POA: Diagnosis not present

## 2021-02-03 DIAGNOSIS — Z6824 Body mass index (BMI) 24.0-24.9, adult: Secondary | ICD-10-CM | POA: Diagnosis not present

## 2021-03-11 ENCOUNTER — Encounter: Payer: Self-pay | Admitting: Gastroenterology

## 2021-03-11 ENCOUNTER — Ambulatory Visit: Payer: Medicare HMO | Admitting: Gastroenterology

## 2021-03-11 VITALS — BP 120/70 | HR 67 | Ht 68.0 in | Wt 158.0 lb

## 2021-03-11 DIAGNOSIS — Z86010 Personal history of colonic polyps: Secondary | ICD-10-CM | POA: Diagnosis not present

## 2021-03-11 DIAGNOSIS — Z800 Family history of malignant neoplasm of digestive organs: Secondary | ICD-10-CM | POA: Diagnosis not present

## 2021-03-11 DIAGNOSIS — Z8673 Personal history of transient ischemic attack (TIA), and cerebral infarction without residual deficits: Secondary | ICD-10-CM

## 2021-03-11 DIAGNOSIS — R748 Abnormal levels of other serum enzymes: Secondary | ICD-10-CM

## 2021-03-11 MED ORDER — SUTAB 1479-225-188 MG PO TABS: 1.0000 | ORAL_TABLET | Freq: Once | ORAL | 0 refills | Status: AC

## 2021-03-11 NOTE — Progress Notes (Signed)
HPI :  75 year old male with a history of rheumatoid arthritis, atrial fibrillation, history of CVA with intracranial hemorrhage, history of colon polyps here to establish care to discuss timing of next colonoscopy, referred by Sallee Provencal NP.  He last saw [place] in October 2016.  At that time he had a colonoscopy done showing 3 small polyps in his colon, 1 of which was sessile serrated, the others were hyperplastic.  He reminds me that his mother had colon cancer and passed of this in her 84s.  He has been having colonoscopy every 5 years or so.  He denies any trouble with his bowels.  No blood in his stools.  No abdominal pains.  He denies any bowel symptoms and generally bother him.  His course has been complicated over the past 2 years, he developed atrial fibrillation and was placed on Eliquis.  He had a fall and developed a intracranial bleed with stroke, was paralyzed for a period of time and has been recovering from that.  He can now walk and has regained some function but not yet back to normal.  He states his heart rate remains controlled but he is not a candidate for anticoagulation in the future.  He otherwise denies any cardiopulmonary symptoms at this time.  On review of his labs from physical this past September he had an ALT of 80 and AST of 62.  Prior LFTs have been normal.  He does drink about 2 beers per day at baseline in regards to his alcohol intake, he is not interested in cutting back and states he wants to enjoy his remaining quality of life.  He denies any new medication changes around the time of his LFT change.  He states he has had some LFTs checked more recently by his rheumatologist but I do have a labs on file recently for him.  We discussed if he wanted to pursue additional colon cancer screening in light of his history as outlined above, and he is not comfortable stopping screening at this time and feels he can handle colonoscopy and has recovered well from his  stroke.   Colonoscopy 09/19/2015 - A sessile polyp measuring 4 mm in size was found in the transverse colon. A polypectomy was performed with cold forceps. The resection was complete, the polyp tissue was completely retrieved and sent to histology. Two sessile polyps measuring 3 mm in size were found in the rectosigmoid colon. Polypectomies were performed with cold forceps. The resection was complete, the polyp tissue was completely retrieved and sent to histology. The examination was otherwise normal. Retroflexed views revealed internal hemorrhoids. The time to cecum = 1.8 Withdrawal time = 14.1 The scope was withdrawn and the procedure Completed.  Surgical [P], transverse, recto-sigmoid, polyp (3) - SESSILE SERRATED POLYP (X1); NEGATIVE FOR CYTOLOGIC DYSPLASIA. - FRAGMENTS OF HYPERPLASTIC POLYPS (X2)   Echo 11/01/2018 - EF 60-65%  LFTs 08/14/20 - ALT 80, AST 62      Past Medical History:  Diagnosis Date  . Arthritis   . Cerebral cavernoma   . Hyperlipidemia   . Hypertension   . Persistent atrial fibrillation ([place])   . Rheumatoid arthritis ([place])   . Stroke [facility])      Past Surgical History:  Procedure Laterality Date  . COLONOSCOPY    . LIPOMA EXCISION    . PALATE / UVULA BIOPSY / EXCISION    . TONSILLECTOMY     age 42   Family History  Problem Relation Age of Onset  .  Colon cancer Mother 36       57  . Hepatitis C Father   . CAD Father    Social History   Tobacco Use  . Smoking status: Never Smoker  . Smokeless tobacco: Never Used  Vaping Use  . Vaping Use: Never used  Substance Use Topics  . Alcohol use: Not Currently    Alcohol/week: 14.0 standard drinks    Types: 14 Cans of beer per week  . Drug use: No   Current Outpatient Medications  Medication Sig Dispense Refill  . Adalimumab 40 MG/0.8ML PSKT Inject 40 mg into the skin See admin instructions. Every other week    . amLODipine (NORVASC) 5 MG tablet TAKE 1 TABLET BY MOUTH ONCE DAILY IN THE EVENING  90 tablet 3  . aspirin EC 81 MG tablet Take 1 tablet (81 mg total) by mouth daily. 90 tablet 1  . atenolol (TENORMIN) 50 MG tablet TAKE 1 & 1/2 (ONE & ONE-HALF) TABLETS BY MOUTH ONCE DAILY 135 tablet 3  . predniSONE (DELTASONE) 5 MG tablet Take 5 mg by mouth daily.    . simvastatin (ZOCOR) 20 MG tablet Take 1 tablet (20 mg total) by mouth at bedtime. 90 tablet 3   No current facility-administered medications for this visit.   Allergies  Allergen Reactions  . Atorvastatin Hives  . Cetirizine Hcl Hives     Review of Systems: All systems reviewed and negative except where noted in HPI.   Lab Results  Component Value Date   WBC 5.0 08/14/2020   HGB 16.0 08/14/2020   HCT 47.8 08/14/2020   MCV 94.1 08/14/2020   PLT 184 08/14/2020    Lab Results  Component Value Date   CREATININE 1.06 08/14/2020   BUN 12 08/14/2020   NA 138 08/14/2020   K 4.9 08/14/2020   CL 103 08/14/2020   CO2 28 08/14/2020    Lab Results  Component Value Date   ALT 80 (H) 08/14/2020   AST 62 (H) 08/14/2020   ALKPHOS 49 08/10/2019   BILITOT 1.2 08/14/2020     Physical Exam: BP 120/70   Pulse 67   Ht 5\' 8"  (1.727 m)   Wt 158 lb (71.7 kg)   BMI 24.02 kg/m  Constitutional: Pleasant, male in no acute distress. HEENT: Normocephalic and atraumatic. Conjunctivae are normal. No scleral icterus. Neck supple.  Cardiovascular: Normal rate, regular rhythm.  Pulmonary/chest: Effort normal and breath sounds normal. No wheezing, rales or rhonchi. Abdominal: Soft, nondistended, nontender. . There are no masses palpable.  Extremities: no edema Lymphadenopathy: No cervical adenopathy noted. Neurological: Alert and oriented to person place and time. Skin: Skin is warm and dry. No rashes noted. Psychiatric: Normal mood and affect. Behavior is normal.   ASSESSMENT AND PLAN: 75 y/o male here to establish care regarding the following:  Family history of colon cancer History of colon polyps History of  stroke Elevated liver enzymes  Patient is due for surveillance colonoscopy given his history of polyps and his strong family history of colon cancer.  We discussed given his medical problems over the past few years if he felt well enough to do another colonoscopy and if he wished to pursue further surveillance exams.  We discussed what a colonoscopy entailed, risks / benefits of that and anesthesia.  He is not comfortable stopping surveillance at this time and does wish to proceed with colonoscopy at this time.  If there are no high risk lesions on this colonoscopy then he may stop  future surveillance.  Otherwise we reviewed his lab work.  His LFTs in September were mildly elevated.  He states he has been followed up with labs in recent months but I do not see that on file, will reach out to his rheumatologist to check that.  If he has a persistent LFT elevation would recommend further work-up for that.  He does drink alcohol but enzyme pattern does not fit that of alcohol use, further he is not interested in reducing his alcohol intake at this time.  Plan: - we will schedule colonoscopy as outlined above - obtain blood work from rheumatology to review LFTs, further work-up if there is persistent elevation.  Armour Cellar, MD [facility]  CC: Dorothyann Peng, NP

## 2021-03-11 NOTE — Patient Instructions (Addendum)
If you are age 73 or older, your body mass index should be between 23-30. Your Body mass index is 24.02 kg/m. If this is out of the aforementioned range listed, please consider follow up with your Primary Care Provider.  If you are age 76 or younger, your body mass index should be between 19-25. Your Body mass index is 24.02 kg/m. If this is out of the aformentioned range listed, please consider follow up with your Primary Care Provider.    You have been scheduled for a colonoscopy. Please follow written instructions given to you at your visit today.  Please pick up your prep supplies at the pharmacy within the next 1-3 days. If you use inhalers (even only as needed), please bring them with you on the day of your procedure.   We will request your labs  from Dr. Melissa Noon office.  Thank you for entrusting me with your care and for choosing [facility], Dr. Blackwater Cellar

## 2021-03-14 ENCOUNTER — Telehealth: Payer: Self-pay

## 2021-03-14 NOTE — Telephone Encounter (Signed)
Called Dr. Melissa Noon office to check on records request. Spoke to Lelon Frohlich who indicated she would fax latest lab work to Dr. Havery Moros today.

## 2021-03-18 ENCOUNTER — Telehealth: Payer: Self-pay | Admitting: Gastroenterology

## 2021-03-18 NOTE — Telephone Encounter (Signed)
Spoke with patient in regards to Dr. Doyne Keel recommendations. Patient states that his LFT's have been up and down all of his life probably because of the alcohol and he has it checked with his PCP. Patient states that he would not like any further work up for his liver and will continue to see his PCP. Patient wanted to know if we were trying to keep him from having his colonoscopy, advised that he is still scheduled for his colonoscopy in June and he states that he will see [place] then.

## 2021-03-18 NOTE — Telephone Encounter (Signed)
Patient's records arrived from outside lab, do not February 04, 2021 as outlined:  WBC 4.5, Hgb 15.8, PLT 149  ALT 65, AST 53, AP 64, T bil 1.6, T prot 8.0  LFT elevation appears to be chronic at this point, he drinks 2 beers per day and is not interested in reducing his alcohol intake however his pattern is not consistent with that of alcohol.  He has a history of rheumatoid arhtritis, recommend the following work-up to be done for his persistent elevation in liver enzymes if he is willing:   - RUQ [place] - labs - hepatitis B surface antigen, hepatitis C antibody, hepatitis A total antibody, hepatitis B surface antibody, ANA, IgG, smooth muscle antibody, TIBC / ferritin level  [place] can please help coordinate ultrasound and labs with the patient if he is willing to proceed?  Thanks

## 2021-04-16 ENCOUNTER — Telehealth: Payer: Self-pay | Admitting: Adult Health

## 2021-04-16 NOTE — Telephone Encounter (Signed)
Documented on spreadsheet 

## 2021-04-16 NOTE — Telephone Encounter (Signed)
Pt does not want to have his medicare wellness done per pt call

## 2021-04-16 NOTE — Telephone Encounter (Signed)
Left message for patient to call back and schedule Medicare Annual Wellness Visit (AWV) either virtually or in office.   Last AWV 02/16/17  please schedule at anytime with [facility] Nurse Health Advisor 1 or 2   This should be a 45 minute visit.

## 2021-04-23 DIAGNOSIS — I2721 Secondary pulmonary arterial hypertension: Secondary | ICD-10-CM

## 2021-04-23 HISTORY — DX: Secondary pulmonary arterial hypertension: I27.21

## 2021-04-24 NOTE — Telephone Encounter (Signed)
Called pt in response to his MyChart message. He describes significant SOB and a choking sensation in his chest.  His heart pounds during these episodes.  This is not new but feels it has progressed.  Occurs with activities like walking to his car or through the house or bending over.    Does not know HR or BP.  Does not check it often.  No chest pain. Swelling to left foot which occurs daily, unchanged.  Low risk nuc study in 2018.  Afib -  not candidate for long term anti-coagulation due to prior intracranial bleed on DOAC.  He requests an appointment and states that Dr.McAlhany told him if he ever had these symptoms to let him know.  He confirms they are not new symptoms but have been worsening. He was "ignoring them" until now.  I have scheduled him with APP at [facility].   Pt aware I will forward to Dr. Angelena Form and call him with any other recommendations.

## 2021-04-24 NOTE — Progress Notes (Signed)
Cardiology Clinic Note   Patient Name: Barry Woods Date of Encounter: 04/25/2021  Primary Care Provider:  Dorothyann Peng, NP Primary Cardiologist:  Lauree Chandler, MD  Patient Profile     Barry Woods 75 year old male presents the clinic today for an evaluation of his shortness of breath.  Past Medical History    Past Medical History:  Diagnosis Date  . Arthritis   . Cerebral cavernoma   . Hyperlipidemia   . Hypertension   . Persistent atrial fibrillation ([place])   . Rheumatoid arthritis ([place])   . Stroke [facility])    Past Surgical History:  Procedure Laterality Date  . COLONOSCOPY    . LIPOMA EXCISION    . PALATE / UVULA BIOPSY / EXCISION    . TONSILLECTOMY     age 11    Allergies  Allergies  Allergen Reactions  . Atorvastatin Hives  . Cetirizine Hcl Hives    History of Present Illness     Barry Woods has a PMH of chronic diastolic CHF, persistent atrial fibrillation, hypertension, hyperlipidemia, and RA.  He was seen as a new consult for evaluation of abnormal CT scan on 04/05/2017.  His chest CT showed coronary calcification.  He did not have any complaints of dyspnea or chest pain at that time.  He was very active.  He underwent nuclear stress testing 5/18 which showed normal LVEF and no ischemia.  He stopped drinking alcohol 5/19 and had a seizure.  A brain scan showed possible tumor.  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  He was found to be in atrial fibrillation 12/19 and was started on Eliquis at that time.  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  He is no longer a candidate for anticoagulation.  He was last seen by Dr. Angelena Form on 08/09/2020.  During that time he continued to deny chest pain, dyspnea, palpitations, lower extremity edema, orthopnea, PND, presyncope and syncope.  He contacted nurse triage line on 04/23/2021 and  indicated that he was having no increased shortness of breath.  He reported that his symptoms were not new.  He felt as if he was choking with increased physical activity.  Who presents the clinic today for evaluation states over the last several days he has noticed increased shortness of breath with physical activity.  He reports that as he walks to the doctor to take out the trash he is having much more shortness of breath.  He also reports that he was making his bed last Saturday and became much more short of breath than normal.  His EKG today shows atrial fibrillation rightward axis deviation 81 bpm.  His weight has been stable.  He reports that he has not been increasing his sodium intake.  Does note left ankle was swollen last Saturday but he is euvolemic today.  He reports that he has not been very physically active since his previous CVA 1/20 and is left foot paralysis.  I will order an echocardiogram, give him salty 6 diet sheet, and follow-up in 1 month.  We will also order a CBC and BMP.  Today denies chest pain,  lower extremity edema, fatigue, palpitations, melena, hematuria, hemoptysis, diaphoresis, weakness, presyncope, syncope, orthopnea, and PND.   Home Medications    Prior to Admission medications   Medication Sig Start Date End Date Taking? Authorizing Provider  Adalimumab 40 MG/0.8ML PSKT Inject  40 mg into the skin See admin instructions. Every other week    [provider]  amLODipine (NORVASC) 5 MG tablet TAKE 1 TABLET BY MOUTH ONCE DAILY IN THE EVENING 12/18/20   Burnell Blanks, MD  aspirin EC 81 MG tablet Take 1 tablet (81 mg total) by mouth daily. 01/31/19   Garvin Fila, MD  atenolol (TENORMIN) 50 MG tablet TAKE 1 & 1/2 (ONE & ONE-HALF) TABLETS BY MOUTH ONCE DAILY 09/30/20   Burnell Blanks, MD  predniSONE (DELTASONE) 5 MG tablet Take 5 mg by mouth daily. 08/08/20   [provider]  simvastatin (ZOCOR) 20 MG tablet Take 1 tablet (20 mg  total) by mouth at bedtime. 08/09/20   Burnell Blanks, MD    Family History    Family History  Problem Relation Age of Onset  . Colon cancer Mother 75       57  . Hepatitis C Father   . CAD Father    He indicated that his mother is deceased. He indicated that his father is deceased. He indicated that his maternal grandmother is deceased. He indicated that his maternal grandfather is deceased. He indicated that his paternal grandmother is deceased. He indicated that his paternal grandfather is deceased.  Social History    Social History   Socioeconomic History  . Marital status: Divorced    Spouse name: Not on file  . Number of children: Not on file  . Years of education: Not on file  . Highest education level: Not on file  Occupational History  . 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 Smoking status: Never Smoker  . Smokeless tobacco: Never Used  Vaping Use  . Vaping Use: Never used  Substance and Sexual Activity  . Alcohol use: Not Currently    Alcohol/week: 14.0 standard drinks    Types: 14 Cans of beer per week  . Drug use: No  . Sexual activity: Not on file  Other Topics Concern  . Not on file  Social History Narrative  . Not on file   Social Determinants of Health   Financial Resource Strain: Not on file  Food Insecurity: Not on file  Transportation Needs: Not on file  Physical Activity: Not on file  Stress: Not on file  Social Connections: Not on file  Intimate Partner Violence: Not on file     Review of Systems    General:  No chills, fever, night sweats or weight changes.  Cardiovascular:  No chest pain, dyspnea on exertion, edema, orthopnea, palpitations, paroxysmal nocturnal dyspnea. Dermatological: No rash, lesions/masses Respiratory: No cough, dyspnea Urologic: No hematuria, dysuria Abdominal:   No nausea, vomiting, diarrhea, bright red blood per rectum, melena, or hematemesis Neurologic:  No visual changes, wkns, changes in  mental status. All other systems reviewed and are otherwise negative except as noted above.  Physical Exam    VS:  BP 132/82 (BP Location: Right Arm, Patient Position: Sitting, Cuff Size: Normal)   Pulse 76   Ht 5\' 7"  (1.702 m)   Wt 159 lb (72.1 kg)   BMI 24.90 kg/m  , BMI Body mass index is 24.9 kg/m. GEN: Well nourished, well developed, in no acute distress. HEENT: normal. Neck: Supple, no JVD, carotid bruits, or masses. Cardiac: RRR, no murmurs, rubs, or gallops. No clubbing, cyanosis, edema.  Radials/DP/PT 2+ and equal bilaterally.  Respiratory:  Respirations regular and unlabored, clear to auscultation bilaterally. GI: Soft, nontender, nondistended, BS + x 4. MS: no  deformity or atrophy. Skin: warm and dry, no rash. Neuro:  Strength and sensation are intact. Psych: Normal affect.  Accessory Clinical Findings    Recent Labs: 08/14/2020: ALT 80; BUN 12; Creat 1.06; Hemoglobin 16.0; Platelets 184; Potassium 4.9; Sodium 138; TSH 2.75   Recent Lipid Panel    Component Value Date/Time   CHOL 153 08/14/2020 0959   TRIG 76 08/14/2020 0959   TRIG 150 (H) 09/28/2006 0957   HDL 68 08/14/2020 0959   CHOLHDL 2.3 08/14/2020 0959   VLDL 14.4 08/10/2019 0932   LDLCALC 69 08/14/2020 0959   LDLDIRECT 126.5 01/18/2013 0807    ECG personally reviewed by me today-atrial fibrillation rightward axis deviation 81 bpm- No acute changes  Echocardiogram 11/01/2018 Study Conclusions   - Left ventricle: The cavity size was normal. Wall thickness was  normal. Systolic function was normal. The estimated ejection  fraction was in the range of 60% to 65%. Incoordinate septal  motion. The study is not technically sufficient to allow  evaluation of LV diastolic function.  - Mitral valve: Mildly thickened leaflets . There was trivial  regurgitation.  - Left atrium: Moderately dilated.  - Right ventricle: The cavity size was mildly dilated.  - Right atrium: Moderately dilated.  -  Tricuspid valve: There was moderate regurgitation.  - Pulmonary arteries: PA peak pressure: 70 mm Hg (S).  - Inferior vena cava: The vessel was dilated. The respirophasic  diameter changes were blunted (< 50%), consistent with elevated  central venous pressure.  Assessment & Plan   1.  Dyspnea on exertion- reports that over the last several days he has had much more dyspnea with mild physical activity.  Reports that tasks he has noticed increased work of breathing including putting new sheets on his bed and taking out the trash. Order echocardiogram Order CBC, BMP  Chronic diastolic CHF- has noticed increased DOE over the past several days.  Echocardiogram 12/19 showed LVEF of 60-65%, moderately dilated left atrium, moderately dilated right atrium and mildly dilated right ventricle. Continue atenolol Heart healthy low-sodium diet-salty 6 given Increase physical activity as tolerated Daily weights   Coronary artery disease-denies chest pain today.  No recent episodes of chest discomfort on neck or back discomfort.  Coronary calcium previously noted on chest CT.  Stress test 5/18 showed no ischemia. Continue aspirin, simvastatin atenolol Heart healthy low-sodium diet-salty 6 given Increase physical activity as tolerated  Essential hypertension-BP today initially 160/80 and 132/82.  Well-controlled at home, 120s over 80s Continue amlodipine, atenolol Heart healthy low-sodium diet-salty 6 given Increase physical activity as tolerated  Atrial fibrillation- heart rate today 81.  Not a candidate for anticoagulation due to prior intracranial hemorrhage while on anticoagulation. Continue atenolol, aspirin Heart healthy low-sodium diet-salty 6 given Avoid triggers caffeine, chocolate, EtOH, dehydration etc.   Disposition: Follow-up with Dr. Angelena Form or me in 1 month.  Jossie Ng. Leonte Horrigan NP-C    04/25/2021, 3:47 PM [address] Office  [phone] Fax [phone]  Notice: This dictation was prepared with Dragon dictation along with smaller phrase technology. Any transcriptional errors that result from this process are unintentional and may not be corrected upon review.  I spent 14 minutes examining this patient, reviewing medications, and using patient centered shared decision making involving her cardiac care.  Prior to her visit I spent greater than 20 minutes reviewing her past medical history,  medications, and prior cardiac tests.

## 2021-04-25 ENCOUNTER — Other Ambulatory Visit: Payer: Self-pay

## 2021-04-25 ENCOUNTER — Ambulatory Visit (INDEPENDENT_AMBULATORY_CARE_PROVIDER_SITE_OTHER): Payer: Medicare HMO | Admitting: General Practice

## 2021-04-25 ENCOUNTER — Encounter: Payer: Self-pay | Admitting: General Practice

## 2021-04-25 VITALS — BP 132/82 | HR 76 | Ht 67.0 in | Wt 159.0 lb

## 2021-04-25 DIAGNOSIS — I10 Essential (primary) hypertension: Secondary | ICD-10-CM | POA: Diagnosis not present

## 2021-04-25 DIAGNOSIS — I5032 Chronic diastolic (congestive) heart failure: Secondary | ICD-10-CM | POA: Diagnosis not present

## 2021-04-25 DIAGNOSIS — R0600 Dyspnea, unspecified: Secondary | ICD-10-CM | POA: Diagnosis not present

## 2021-04-25 DIAGNOSIS — R0609 Other forms of dyspnea: Secondary | ICD-10-CM

## 2021-04-25 DIAGNOSIS — Z79899 Other long term (current) drug therapy: Secondary | ICD-10-CM | POA: Diagnosis not present

## 2021-04-25 DIAGNOSIS — I4819 Other persistent atrial fibrillation: Secondary | ICD-10-CM | POA: Diagnosis not present

## 2021-04-25 DIAGNOSIS — I2510 Atherosclerotic heart disease of native coronary artery without angina pectoris: Secondary | ICD-10-CM

## 2021-04-25 NOTE — Patient Instructions (Signed)
Medication Instructions:  The current medical regimen is effective;  continue present plan and medications as directed. Please refer to the Current Medication list given to you today.  *If you need a refill on your cardiac medications before your next appointment, please call your pharmacy*  Lab Work: BMET AND CBC TODAY If you have labs (blood work) drawn today and your tests are completely normal, you will receive your results only by:  [place] (if you have MyChart) OR A paper copy in the mail.  If you have any lab test that is abnormal or we need to change your treatment, we will call you to review the results. You may go to any [facility] that is convenient for you however, we do have a lab in our office that is able to assist you. You DO NOT need an appointment for our lab. The lab is open 8:00am and closes at 4:00pm. Lunch 12:45 - 1:45pm.  Testing/Procedures: Echocardiogram - Your physician has requested that you have an echocardiogram. Echocardiography is a painless test that uses sound waves to create images of your heart. It provides your doctor with information about the size and shape of your heart and how well your heart's chambers and valves are working. This procedure takes approximately one hour. There are no restrictions for this procedure. This will be performed at our [facility] location - [address].  Special Instructions PLEASE READ AND FOLLOW SALTY 6-ATTACHED-1,800 mg daily  Follow-Up: Your next appointment:  AFTER ECHO In Person with Lauree Chandler, MD OR IF UNAVAILABLE [place], FNP-C  At [facility], you and your health needs are our priority.  As part of our continuing mission to provide you with exceptional heart care, we have created designated Provider Care Teams.  These Care Teams include your primary Cardiologist (physician) and Advanced Practice Providers (APPs -  Physician Assistants and Nurse Practitioners) who all work together to  provide you with the care you need, when you need it.

## 2021-04-26 LAB — CBC
Hematocrit: 42.9 % (ref 37.5–51.0)
Hemoglobin: 15 g/dL (ref 13.0–17.7)
MCH: 31.3 pg (ref 26.6–33.0)
MCHC: 35 g/dL (ref 31.5–35.7)
MCV: 90 fL (ref 79–97)
Platelets: 140 10*3/uL — ABNORMAL LOW (ref 150–450)
RBC: 4.79 x10E6/uL (ref 4.14–5.80)
RDW: 13.3 % (ref 11.6–15.4)
WBC: 3.9 10*3/uL (ref 3.4–10.8)

## 2021-04-26 LAB — BASIC METABOLIC PANEL
BUN/Creatinine Ratio: 13 (ref 10–24)
BUN: 14 mg/dL (ref 8–27)
CO2: 22 mmol/L (ref 20–29)
Calcium: 9.3 mg/dL (ref 8.6–10.2)
Chloride: 100 mmol/L (ref 96–106)
Creatinine, Ser: 1.1 mg/dL (ref 0.76–1.27)
Glucose: 112 mg/dL — ABNORMAL HIGH (ref 65–99)
Potassium: 4.6 mmol/L (ref 3.5–5.2)
Sodium: 136 mmol/L (ref 134–144)
eGFR: 71 mL/min/{1.73_m2} (ref >59)

## 2021-05-02 ENCOUNTER — Other Ambulatory Visit (HOSPITAL_COMMUNITY): Payer: Medicare HMO

## 2021-05-02 ENCOUNTER — Other Ambulatory Visit: Payer: Self-pay

## 2021-05-02 ENCOUNTER — Ambulatory Visit (HOSPITAL_COMMUNITY): Payer: Medicare HMO | Attending: Cardiology

## 2021-05-02 DIAGNOSIS — I5032 Chronic diastolic (congestive) heart failure: Secondary | ICD-10-CM | POA: Insufficient documentation

## 2021-05-02 DIAGNOSIS — R0600 Dyspnea, unspecified: Secondary | ICD-10-CM

## 2021-05-02 DIAGNOSIS — R0609 Other forms of dyspnea: Secondary | ICD-10-CM

## 2021-05-02 LAB — ECHOCARDIOGRAM COMPLETE
Area-P 1/2: 4.49 cm2
S' Lateral: 2.3 cm

## 2021-05-05 ENCOUNTER — Telehealth: Payer: Self-pay | Admitting: General Practice

## 2021-05-05 ENCOUNTER — Telehealth: Payer: Self-pay | Admitting: Cardiovascular Disease

## 2021-05-05 MED ORDER — SILDENAFIL CITRATE 20 MG PO TABS: 20.0000 mg | ORAL_TABLET | Freq: Three times a day (TID) | ORAL | 3 refills | Status: AC

## 2021-05-05 NOTE — Telephone Encounter (Signed)
Daughter of the patient called. Daughter is a Marine scientist and read the results of the Echo. She is concerned about the results and the change that took place from the previous Echo. She wanted to ask that Dr. Angelena Form read the results as well. The Daughter also wanted to know if Dr. Angelena Form is OK with the patient waiting until 06/29.  Please assist

## 2021-05-05 NOTE — Telephone Encounter (Signed)
Pt informed of providers result & recommendations. Pt verbalized understanding. No further questions. Pt notified not to take nitro with this medication, verbalized understanding NEW RX SENT TO REQUESTED PHARMACY.

## 2021-05-05 NOTE — Telephone Encounter (Signed)
Recent echocardiogram results reviewed with DOD.  Dr. Angelena Form out of town on vacation at this time.  Due to his elevated pulmonary artery systolic pressure and ventricular systolic pressure of 03.4.  We will start him on sildenafil 20 mg 3 times per day.  Medication should be administered 4- 6 hours apart.  Please ask him to continue to eat a heart healthy low-sodium diet and maintain his physical activity.  We will keep his scheduled follow-up appointment.  Thank you,   [name]

## 2021-05-05 NOTE — Addendum Note (Signed)
Addended by: Waylan Rocher on: 05/05/2021 04:37 PM   Modules accepted: Orders

## 2021-05-07 NOTE — Telephone Encounter (Signed)
Thanks [name]. Agree with your plan. Barry Woods

## 2021-05-08 ENCOUNTER — Telehealth: Payer: Self-pay | Admitting: Gastroenterology

## 2021-05-08 NOTE — Telephone Encounter (Signed)
Hi Dr. 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 He is very concerned about his health and stability to undergo procedure given his heart condition and now this finding. He will call back to reschedule when he feels more stable. Thank you.

## 2021-05-08 NOTE — Telephone Encounter (Signed)
Patient called with some clinical questions regarding his medical history\conditions. Has a colonoscopy schedule for 05/12/21 and needs to make sure he can still proceed with it or cancel.

## 2021-05-08 NOTE — Telephone Encounter (Signed)
Okay thanks for letting me know, he can call to reschedule when he is feeling better

## 2021-05-12 ENCOUNTER — Encounter: Payer: Medicare HMO | Admitting: Gastroenterology

## 2021-05-19 DIAGNOSIS — M0589 Other rheumatoid arthritis with rheumatoid factor of multiple sites: Secondary | ICD-10-CM | POA: Diagnosis not present

## 2021-05-19 DIAGNOSIS — M25512 Pain in left shoulder: Secondary | ICD-10-CM | POA: Diagnosis not present

## 2021-05-19 DIAGNOSIS — R945 Abnormal results of liver function studies: Secondary | ICD-10-CM | POA: Diagnosis not present

## 2021-05-19 DIAGNOSIS — Z6823 Body mass index (BMI) 23.0-23.9, adult: Secondary | ICD-10-CM | POA: Diagnosis not present

## 2021-05-19 NOTE — Progress Notes (Signed)
Cardiology Clinic Note   Patient Name: Barry Woods Date of Encounter: 05/21/2021  Primary Care Provider:  Dorothyann Peng, NP Primary Cardiologist:  Lauree Chandler, MD  Patient Profile    Barry Woods 75 year old male presents the clinic today for a follow-up evaluation of his shortness of breath.  Past Medical History    Past Medical History:  Diagnosis Date   Arthritis    Cerebral cavernoma    Hyperlipidemia    Hypertension    Persistent atrial fibrillation (HCC)    Pulmonary arterial hypertension ([place]) 04/2021   Rheumatoid arthritis ([place])    Stroke [facility])    Past Surgical History:  Procedure Laterality Date   COLONOSCOPY     LIPOMA EXCISION     PALATE / UVULA BIOPSY / EXCISION     TONSILLECTOMY     age 33    Allergies  Allergies  Allergen Reactions   Atorvastatin Hives   Cetirizine Hcl Hives    History of Present Illness    Barry Woods has a PMH of chronic diastolic CHF, persistent atrial fibrillation, hypertension, hyperlipidemia, and RA.  He was seen as a new consult for evaluation of abnormal CT scan on 04/05/2017.  His chest CT showed coronary calcification.  He did not have any complaints of dyspnea or chest pain at that time.  He was very active.  He underwent nuclear stress testing 5/18 which showed normal LVEF and no ischemia.  He stopped drinking alcohol 5/19 and had a seizure.  A brain scan showed possible tumor.  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  He was found to be in atrial fibrillation 12/19 and was started on Eliquis at that time.  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  He is no longer a candidate for anticoagulation.   He was last seen by Dr. Angelena Form on 08/09/2020.  During that time he continued to deny chest pain, dyspnea, palpitations, lower extremity edema, orthopnea, PND, presyncope and syncope.   He  contacted nurse triage line on 04/23/2021 and indicated that he was having no increased shortness of breath.  He reported that his symptoms were not new.  He felt as if he was choking with increased physical activity.   He presented to the clinic 04/25/2021 for evaluation stated over the last several days he had noticed increased shortness of breath with physical activity.  He reported that as he walked to take out the trash he was having much more shortness of breath.  He also reported that he was making his bed last Saturday and became much more short of breath than normal.  His EKG  showed atrial fibrillation rightward axis deviation 81 bpm.  His weight had been stable.  He reported that he had not been increasing his sodium intake.  He did note left ankle swelling last Saturday but he was euvolemic during his visit.  He reported that he had not been very physically active since his previous CVA 1/20 and had left foot paralysis.  I ordered an echocardiogram, gave him salty 6 diet sheet, and planned follow-up in 1 month.  We will also order a CBC and BMP.  His lab work showed that his platelets were decreased and he was asked to follow-up with PCP.  His echocardiogram showed normal pumping function, dilated left atria and dilated right atria, mild mitral valve regurgitation, trivial aortic valve regurgitation,  and moderately elevated pulmonary artery pressure.  He was started on sildenafil 20 mg 3 times daily.  He presents the clinic today for follow-up evaluation states he has not noted much difference with the addition of sildenafil to his medication regimen.  He was able to play golf last week but reports that he was taking prednisone at that time.  He plans to push mow his daughter's yard later this week.  We reviewed the results of his echocardiogram and the importance of lowering pressures related to his PAH.  He expressed understanding.  We reviewed his medications and I recommended consultation with  pulmonology.  He reports that his daughter is very familiar with pulmonary artery hypertension and a recommendation was made for a [facility] pulmonologist.  He wishes to follow-up with pulmonology through [facility] so that communication between cardiology and pulmonology will be more fluid.  I will have him continue to increase his physical activity as tolerated, continue heart healthy low-sodium diet, continue current medication regimen and follow-up with Dr. Angelena Form in 4 months.  We will consult pulmonology as well.   Today denies chest pain,  lower extremity edema, fatigue, palpitations, melena, hematuria, hemoptysis, diaphoresis, weakness, presyncope, syncope, orthopnea, and PND.  Home Medications    Prior to Admission medications   Medication Sig Start Date End Date Taking? Authorizing Provider  Adalimumab 40 MG/0.8ML PSKT Inject 40 mg into the skin See admin instructions. Every other week    [provider]  amLODipine (NORVASC) 5 MG tablet TAKE 1 TABLET BY MOUTH ONCE DAILY IN THE EVENING Patient taking differently: Take 2.5 mg by mouth daily. TAKE 0.5 TABLET BY MOUTH ONCE DAILY IN THE EVENING 12/18/20   Burnell Blanks, MD  aspirin EC 81 MG tablet Take 1 tablet (81 mg total) by mouth daily. 01/31/19   Garvin Fila, MD  atenolol (TENORMIN) 50 MG tablet TAKE 1 & 1/2 (ONE & ONE-HALF) TABLETS BY MOUTH ONCE DAILY 09/30/20   Burnell Blanks, MD  predniSONE (DELTASONE) 5 MG tablet Take 5 mg by mouth daily. 08/08/20   [provider]  sildenafil (REVATIO) 20 MG tablet Take 1 tablet (20 mg total) by mouth 3 (three) times daily. 05/05/21   Deberah Pelton, NP  simvastatin (ZOCOR) 20 MG tablet Take 1 tablet (20 mg total) by mouth at bedtime. 08/09/20   Burnell Blanks, MD    Family History    Family History  Problem Relation Age of Onset   Colon cancer Mother 63       57   Hepatitis C Father    CAD Father    He indicated that his mother is  deceased. He indicated that his father is deceased. He indicated that his maternal grandmother is deceased. He indicated that his maternal grandfather is deceased. He indicated that his paternal grandmother is deceased. He indicated that his paternal grandfather is deceased.  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  Tobacco Use   Smoking status: Never   Smokeless tobacco: Never  Vaping Use   Vaping Use: Never used  Substance and Sexual Activity   Alcohol use: Not Currently    Alcohol/week: 14.0 standard drinks    Types: 14 Cans  of beer per week   Drug use: No   Sexual activity: Not on file  Other Topics Concern   Not on file  Social History Narrative   Not on file   Social Determinants of Health   Financial Resource Strain: Not on file  Food Insecurity: Not on file  Transportation Needs: Not on file  Physical Activity: Not on file  Stress: Not on file  Social Connections: Not on file  Intimate Partner Violence: Not on file     Review of Systems    General:  No chills, fever, night sweats or weight changes.  Cardiovascular:  No chest pain, dyspnea on exertion, edema, orthopnea, palpitations, paroxysmal nocturnal dyspnea. Dermatological: No rash, lesions/masses Respiratory: No cough, dyspnea.  Continues with dyspnea on exertion Urologic: No hematuria, dysuria Abdominal:   No nausea, vomiting, diarrhea, bright red blood per rectum, melena, or hematemesis Neurologic:  No visual changes, wkns, changes in mental status. All other systems reviewed and are otherwise negative except as noted above.  Physical Exam    VS:  BP 110/60 (BP Location: Right Arm, Patient Position: Sitting, Cuff Size: Normal)   Pulse 68   Ht 5\' 8"  (1.727 m)   Wt  153 lb 9.6 oz (69.7 kg)   SpO2 98%   BMI 23.35 kg/m  , BMI Body mass index is 23.35 kg/m. GEN: Well nourished, well developed, in no acute distress. HEENT: normal. Neck: Supple, no JVD, carotid bruits, or masses. Cardiac: RRR, no murmurs, rubs, or gallops. No clubbing, cyanosis, edema.  Radials/DP/PT 2+ and equal bilaterally.  Respiratory:  Respirations regular and unlabored, clear to auscultation bilaterally. GI: Soft, nontender, nondistended, BS + x 4. MS: no deformity or atrophy. Skin: warm and dry, no rash. Neuro:  Strength and sensation are intact. Psych: Normal affect.  Accessory Clinical Findings    Recent Labs: 08/14/2020: ALT 80; TSH 2.75 04/25/2021: BUN 14; Creatinine, Ser 1.10; Hemoglobin 15.0; Platelets 140; Potassium 4.6; Sodium 136   Recent Lipid Panel    Component Value Date/Time   CHOL 153 08/14/2020 0959   TRIG 76 08/14/2020 0959   TRIG 150 (H) 09/28/2006 0957   HDL 68 08/14/2020 0959   CHOLHDL 2.3 08/14/2020 0959   VLDL 14.4 08/10/2019 0932   LDLCALC 69 08/14/2020 0959   LDLDIRECT 126.5 01/18/2013 0807    ECG personally reviewed by me today-none today.  Echocardiogram 05/05/2021 IMPRESSIONS     1. Left ventricular ejection fraction, by estimation, is 60 to 65%. The  left ventricle has normal function. The left ventricle has no regional  wall motion abnormalities. Diastolic function is indeterminant due to  atrial fibrillation. There is the  interventricular septum is flattened in diastole ('D' shaped left  ventricle), consistent with right ventricular volume overload.   2. Right ventricular systolic function is mildly reduced. The right  ventricular size is moderately-to-severely enlarged. There is moderately  elevated pulmonary artery systolic pressure. The estimated right  ventricular systolic pressure is 95.0 mmHg.   3. Left atrial size was moderately dilated.   4. Right atrial size was massively dilated.   5. The mitral valve is normal in  structure. Mild mitral valve  regurgitation.   6. The aortic valve is tricuspid. Aortic valve regurgitation is trivial.  No aortic stenosis is present.   7. Aortic dilatation noted. There is borderline dilatation of the  ascending aorta, measuring 36 mm.   8. The inferior vena cava is dilated in size with <50% respiratory  variability, suggesting right atrial  pressure of 15 mmHg.   Comparison(s): Compared to prior TTE in 2019, the RV now appears  moderately-to-severely enlarged. PASP now 4mmHg (previously 80mmHg).   Assessment & Plan   1.  Dyspnea on exertion/ PAH- has not noted significant improvement with his breathing.  Echocardiogram showed moderately increased pulmonary artery pressure.  He was started on sildenafil 20 mg 3 times daily. Continue sildenafil Heart healthy low-sodium diet Increase physical activity as tolerated Consult pulmonology  Chronic diastolic CHF-euvolemic today.  Continues with DOE.  Echocardiogram 05/05/2021 showed normal pumping function, dilated left atria and dilated right atria, mild mitral valve regurgitation, trivial aortic valve regurgitation, and moderately elevated pulmonary artery pressure.  Continue atenolol Heart healthy low-sodium diet-salty 6 given Increase physical activity as tolerated Daily weights    Coronary artery disease- No chest pain today or recent episodes.   Coronary calcium previously noted on chest CT.  Stress test 5/18 showed no ischemia.  Lipids well controlled. Continue aspirin, simvastatin atenolol Heart healthy low-sodium diet-salty 6 given Increase physical activity as tolerated   Essential hypertension-BP today 110/60.  Well-controlled at home. Continue amlodipine, atenolol Heart healthy low-sodium diet-salty 6 given Increase physical activity as tolerated   Atrial fibrillation- heart rate today 68.  Not a candidate for anticoagulation due to prior intracranial hemorrhage while on anticoagulation. Continue atenolol,  aspirin Heart healthy low-sodium diet-salty 6 given Avoid triggers caffeine, chocolate, EtOH, dehydration etc.     Disposition: Follow-up with Dr. Angelena Form in 3-4 months.   Jossie Ng. Ruthene Methvin NP-C    05/21/2021, 11:41 AM [address] Office [phone] Fax [phone]  Notice: This dictation was prepared with Dragon dictation along with smaller phrase technology. Any transcriptional errors that result from this process are unintentional and may not be corrected upon review.  I spent 14 minutes examining this patient, reviewing medications, and using patient centered shared decision making involving her cardiac care.  Prior to her visit I spent greater than 20 minutes reviewing her past medical history,  medications, and prior cardiac tests.

## 2021-05-21 ENCOUNTER — Ambulatory Visit: Payer: Medicare HMO | Admitting: General Practice

## 2021-05-21 ENCOUNTER — Encounter: Payer: Self-pay | Admitting: General Practice

## 2021-05-21 ENCOUNTER — Other Ambulatory Visit: Payer: Self-pay

## 2021-05-21 VITALS — BP 110/60 | HR 68 | Ht 68.0 in | Wt 153.6 lb

## 2021-05-21 DIAGNOSIS — I4819 Other persistent atrial fibrillation: Secondary | ICD-10-CM | POA: Diagnosis not present

## 2021-05-21 DIAGNOSIS — I272 Pulmonary hypertension, unspecified: Secondary | ICD-10-CM

## 2021-05-21 DIAGNOSIS — R0600 Dyspnea, unspecified: Secondary | ICD-10-CM

## 2021-05-21 DIAGNOSIS — I2510 Atherosclerotic heart disease of native coronary artery without angina pectoris: Secondary | ICD-10-CM | POA: Diagnosis not present

## 2021-05-21 DIAGNOSIS — I10 Essential (primary) hypertension: Secondary | ICD-10-CM | POA: Diagnosis not present

## 2021-05-21 DIAGNOSIS — R0609 Other forms of dyspnea: Secondary | ICD-10-CM

## 2021-05-21 DIAGNOSIS — I5032 Chronic diastolic (congestive) heart failure: Secondary | ICD-10-CM | POA: Diagnosis not present

## 2021-05-21 NOTE — Patient Instructions (Signed)
Medication Instructions:  The current medical regimen is effective;  continue present plan and medications as directed. Please refer to the Current Medication list given to you today.  *If you need a refill on your cardiac medications before your next appointment, please call your pharmacy*  Lab Work:   Testing/Procedures:  NONE    NONE  Special Instructions REFERRAL TO PULMONARY FOR DR Lake Bells  Follow-Up: Your next appointment:  KEEP SCHEDULED APPOINTMENT In Person with Lauree Chandler, MD    At [facility], you and your health needs are our priority.  As part of our continuing mission to provide you with exceptional heart care, we have created designated Provider Care Teams.  These Care Teams include your primary Cardiologist (physician) and Advanced Practice Providers (APPs -  Physician Assistants and Nurse Practitioners) who all work together to provide you with the care you need, when you need it.

## 2021-06-02 ENCOUNTER — Inpatient Hospital Stay (HOSPITAL_COMMUNITY)
Admission: EM | Admit: 2021-06-02 | Discharge: 2021-06-23 | DRG: 064 | Disposition: E | Payer: Medicare HMO | Attending: Neurology | Admitting: Neurology

## 2021-06-02 ENCOUNTER — Emergency Department (HOSPITAL_COMMUNITY): Payer: Medicare HMO

## 2021-06-02 ENCOUNTER — Other Ambulatory Visit (HOSPITAL_COMMUNITY): Payer: Medicare HMO

## 2021-06-02 DIAGNOSIS — I615 Nontraumatic intracerebral hemorrhage, intraventricular: Principal | ICD-10-CM | POA: Diagnosis present

## 2021-06-02 DIAGNOSIS — W19XXXA Unspecified fall, initial encounter: Secondary | ICD-10-CM | POA: Diagnosis not present

## 2021-06-02 DIAGNOSIS — G9340 Encephalopathy, unspecified: Secondary | ICD-10-CM | POA: Diagnosis not present

## 2021-06-02 DIAGNOSIS — Z66 Do not resuscitate: Secondary | ICD-10-CM | POA: Diagnosis not present

## 2021-06-02 DIAGNOSIS — R29898 Other symptoms and signs involving the musculoskeletal system: Secondary | ICD-10-CM | POA: Diagnosis not present

## 2021-06-02 DIAGNOSIS — I700 Atherosclerosis of aorta: Secondary | ICD-10-CM | POA: Diagnosis not present

## 2021-06-02 DIAGNOSIS — R221 Localized swelling, mass and lump, neck: Secondary | ICD-10-CM | POA: Diagnosis not present

## 2021-06-02 DIAGNOSIS — T17918A Gastric contents in respiratory tract, part unspecified causing other injury, initial encounter: Secondary | ICD-10-CM

## 2021-06-02 DIAGNOSIS — I10 Essential (primary) hypertension: Secondary | ICD-10-CM | POA: Diagnosis not present

## 2021-06-02 DIAGNOSIS — I611 Nontraumatic intracerebral hemorrhage in hemisphere, cortical: Secondary | ICD-10-CM | POA: Diagnosis not present

## 2021-06-02 DIAGNOSIS — R29810 Facial weakness: Secondary | ICD-10-CM | POA: Diagnosis present

## 2021-06-02 DIAGNOSIS — R4781 Slurred speech: Secondary | ICD-10-CM | POA: Diagnosis not present

## 2021-06-02 DIAGNOSIS — E161 Other hypoglycemia: Secondary | ICD-10-CM | POA: Diagnosis not present

## 2021-06-02 DIAGNOSIS — R609 Edema, unspecified: Secondary | ICD-10-CM | POA: Diagnosis present

## 2021-06-02 DIAGNOSIS — Z79899 Other long term (current) drug therapy: Secondary | ICD-10-CM | POA: Diagnosis not present

## 2021-06-02 DIAGNOSIS — I4819 Other persistent atrial fibrillation: Secondary | ICD-10-CM | POA: Diagnosis present

## 2021-06-02 DIAGNOSIS — M069 Rheumatoid arthritis, unspecified: Secondary | ICD-10-CM | POA: Diagnosis present

## 2021-06-02 DIAGNOSIS — T17910A Gastric contents in respiratory tract, part unspecified causing asphyxiation, initial encounter: Secondary | ICD-10-CM

## 2021-06-02 DIAGNOSIS — T17908A Unspecified foreign body in respiratory tract, part unspecified causing other injury, initial encounter: Secondary | ICD-10-CM | POA: Diagnosis not present

## 2021-06-02 DIAGNOSIS — S40812A Abrasion of left upper arm, initial encounter: Secondary | ICD-10-CM | POA: Diagnosis present

## 2021-06-02 DIAGNOSIS — E854 Organ-limited amyloidosis: Secondary | ICD-10-CM | POA: Diagnosis present

## 2021-06-02 DIAGNOSIS — Z7982 Long term (current) use of aspirin: Secondary | ICD-10-CM

## 2021-06-02 DIAGNOSIS — I110 Hypertensive heart disease with heart failure: Secondary | ICD-10-CM | POA: Diagnosis present

## 2021-06-02 DIAGNOSIS — Z20822 Contact with and (suspected) exposure to covid-19: Secondary | ICD-10-CM | POA: Diagnosis not present

## 2021-06-02 DIAGNOSIS — I619 Nontraumatic intracerebral hemorrhage, unspecified: Secondary | ICD-10-CM

## 2021-06-02 DIAGNOSIS — G8194 Hemiplegia, unspecified affecting left nondominant side: Secondary | ICD-10-CM | POA: Diagnosis present

## 2021-06-02 DIAGNOSIS — Z8249 Family history of ischemic heart disease and other diseases of the circulatory system: Secondary | ICD-10-CM

## 2021-06-02 DIAGNOSIS — Y92009 Unspecified place in unspecified non-institutional (private) residence as the place of occurrence of the external cause: Secondary | ICD-10-CM | POA: Diagnosis not present

## 2021-06-02 DIAGNOSIS — J9811 Atelectasis: Secondary | ICD-10-CM | POA: Diagnosis not present

## 2021-06-02 DIAGNOSIS — G935 Compression of brain: Secondary | ICD-10-CM | POA: Diagnosis not present

## 2021-06-02 DIAGNOSIS — I2721 Secondary pulmonary arterial hypertension: Secondary | ICD-10-CM | POA: Diagnosis present

## 2021-06-02 DIAGNOSIS — Z8673 Personal history of transient ischemic attack (TIA), and cerebral infarction without residual deficits: Secondary | ICD-10-CM

## 2021-06-02 DIAGNOSIS — R29716 NIHSS score 16: Secondary | ICD-10-CM | POA: Diagnosis present

## 2021-06-02 DIAGNOSIS — Z743 Need for continuous supervision: Secondary | ICD-10-CM | POA: Diagnosis not present

## 2021-06-02 DIAGNOSIS — R4182 Altered mental status, unspecified: Secondary | ICD-10-CM | POA: Diagnosis not present

## 2021-06-02 DIAGNOSIS — I5032 Chronic diastolic (congestive) heart failure: Secondary | ICD-10-CM | POA: Diagnosis present

## 2021-06-02 DIAGNOSIS — G936 Cerebral edema: Secondary | ICD-10-CM | POA: Diagnosis present

## 2021-06-02 DIAGNOSIS — Z515 Encounter for palliative care: Secondary | ICD-10-CM

## 2021-06-02 DIAGNOSIS — I610 Nontraumatic intracerebral hemorrhage in hemisphere, subcortical: Secondary | ICD-10-CM | POA: Diagnosis present

## 2021-06-02 DIAGNOSIS — E785 Hyperlipidemia, unspecified: Secondary | ICD-10-CM | POA: Diagnosis present

## 2021-06-02 DIAGNOSIS — I639 Cerebral infarction, unspecified: Secondary | ICD-10-CM | POA: Diagnosis not present

## 2021-06-02 DIAGNOSIS — R414 Neurologic neglect syndrome: Secondary | ICD-10-CM | POA: Diagnosis present

## 2021-06-02 DIAGNOSIS — J849 Interstitial pulmonary disease, unspecified: Secondary | ICD-10-CM | POA: Diagnosis present

## 2021-06-02 DIAGNOSIS — Z888 Allergy status to other drugs, medicaments and biological substances status: Secondary | ICD-10-CM

## 2021-06-02 DIAGNOSIS — R569 Unspecified convulsions: Secondary | ICD-10-CM | POA: Diagnosis not present

## 2021-06-02 DIAGNOSIS — R0902 Hypoxemia: Secondary | ICD-10-CM | POA: Diagnosis not present

## 2021-06-02 DIAGNOSIS — I517 Cardiomegaly: Secondary | ICD-10-CM | POA: Diagnosis not present

## 2021-06-02 DIAGNOSIS — E162 Hypoglycemia, unspecified: Secondary | ICD-10-CM | POA: Diagnosis not present

## 2021-06-02 LAB — DIFFERENTIAL
Abs Immature Granulocytes: 0.05 10*3/uL (ref 0.00–0.07)
Basophils Absolute: 0 10*3/uL (ref 0.0–0.1)
Basophils Relative: 1 %
Eosinophils Absolute: 0.1 10*3/uL (ref 0.0–0.5)
Eosinophils Relative: 2 %
Immature Granulocytes: 1 %
Lymphocytes Relative: 30 %
Lymphs Abs: 1.8 10*3/uL (ref 0.7–4.0)
Monocytes Absolute: 0.7 10*3/uL (ref 0.1–1.0)
Monocytes Relative: 12 %
Neutro Abs: 3.2 10*3/uL (ref 1.7–7.7)
Neutrophils Relative %: 54 %

## 2021-06-02 LAB — URINALYSIS, ROUTINE W REFLEX MICROSCOPIC
Bilirubin Urine: NEGATIVE
Glucose, UA: NEGATIVE mg/dL
Hgb urine dipstick: NEGATIVE
Ketones, ur: NEGATIVE mg/dL
Leukocytes,Ua: NEGATIVE
Nitrite: NEGATIVE
Protein, ur: NEGATIVE mg/dL
Specific Gravity, Urine: 1.01 (ref 1.005–1.030)
pH: 7 (ref 5.0–8.0)

## 2021-06-02 LAB — CBC
HCT: 43.2 % (ref 39.0–52.0)
Hemoglobin: 14.7 g/dL (ref 13.0–17.0)
MCH: 31.6 pg (ref 26.0–34.0)
MCHC: 34 g/dL (ref 30.0–36.0)
MCV: 92.9 fL (ref 80.0–100.0)
Platelets: 118 10*3/uL — ABNORMAL LOW (ref 150–400)
RBC: 4.65 MIL/uL (ref 4.22–5.81)
RDW: 14.5 % (ref 11.5–15.5)
WBC: 5.9 10*3/uL (ref 4.0–10.5)
nRBC: 0 % (ref 0.0–0.2)

## 2021-06-02 LAB — RESP PANEL BY RT-PCR (FLU A&B, COVID) ARPGX2
Influenza A by PCR: NEGATIVE
Influenza B by PCR: NEGATIVE
SARS Coronavirus 2 by RT PCR: NEGATIVE

## 2021-06-02 LAB — ETHANOL: Alcohol, Ethyl (B): 70 mg/dL — ABNORMAL HIGH (ref <10)

## 2021-06-02 LAB — RAPID URINE DRUG SCREEN, HOSP PERFORMED
Amphetamines: NOT DETECTED
Barbiturates: NOT DETECTED
Benzodiazepines: NOT DETECTED
Cocaine: NOT DETECTED
Opiates: NOT DETECTED
Tetrahydrocannabinol: NOT DETECTED

## 2021-06-02 LAB — I-STAT CHEM 8, ED
BUN: 14 mg/dL (ref 8–23)
Calcium, Ion: 0.99 mmol/L — ABNORMAL LOW (ref 1.15–1.40)
Chloride: 104 mmol/L (ref 98–111)
Creatinine, Ser: 0.8 mg/dL (ref 0.61–1.24)
Glucose, Bld: 90 mg/dL (ref 70–99)
HCT: 42 % (ref 39.0–52.0)
Hemoglobin: 14.3 g/dL (ref 13.0–17.0)
Potassium: 3.6 mmol/L (ref 3.5–5.1)
Sodium: 135 mmol/L (ref 135–145)
TCO2: 20 mmol/L — ABNORMAL LOW (ref 22–32)

## 2021-06-02 LAB — COMPREHENSIVE METABOLIC PANEL
ALT: 79 U/L — ABNORMAL HIGH (ref 0–44)
AST: 52 U/L — ABNORMAL HIGH (ref 15–41)
Albumin: 3.2 g/dL — ABNORMAL LOW (ref 3.5–5.0)
Alkaline Phosphatase: 43 U/L (ref 38–126)
Anion gap: 12 (ref 5–15)
BUN: 14 mg/dL (ref 8–23)
CO2: 19 mmol/L — ABNORMAL LOW (ref 22–32)
Calcium: 8.8 mg/dL — ABNORMAL LOW (ref 8.9–10.3)
Chloride: 103 mmol/L (ref 98–111)
Creatinine, Ser: 0.78 mg/dL (ref 0.61–1.24)
GFR, Estimated: >60 mL/min (ref >60)
Glucose, Bld: 93 mg/dL (ref 70–99)
Potassium: 3.6 mmol/L (ref 3.5–5.1)
Sodium: 134 mmol/L — ABNORMAL LOW (ref 135–145)
Total Bilirubin: 1.8 mg/dL — ABNORMAL HIGH (ref 0.3–1.2)
Total Protein: 7.2 g/dL (ref 6.5–8.1)

## 2021-06-02 LAB — APTT: aPTT: 38 seconds — ABNORMAL HIGH (ref 24–36)

## 2021-06-02 LAB — PROTIME-INR
INR: 1.1 (ref 0.8–1.2)
Prothrombin Time: 14.1 seconds (ref 11.4–15.2)

## 2021-06-02 LAB — CBG MONITORING, ED: Glucose-Capillary: 84 mg/dL (ref 70–99)

## 2021-06-02 MED ORDER — SENNOSIDES-DOCUSATE SODIUM 8.6-50 MG PO TABS: 1.0000 | ORAL_TABLET | Freq: Two times a day (BID) | ORAL | Status: DC

## 2021-06-02 MED ORDER — LEVETIRACETAM IN NACL 1000 MG/100ML IV SOLN
1000.0000 mg | Freq: Two times a day (BID) | INTRAVENOUS | Status: DC
  Administered 2021-06-03: 1000 mg via INTRAVENOUS
  Filled 2021-06-02 (×2): qty 100

## 2021-06-02 MED ORDER — PANTOPRAZOLE SODIUM 40 MG IV SOLR
40.0000 mg | Freq: Every day | INTRAVENOUS | Status: DC
  Administered 2021-06-02: 40 mg via INTRAVENOUS
  Filled 2021-06-02: qty 40

## 2021-06-02 MED ORDER — SODIUM CHLORIDE 0.9 % IV SOLN
12.5000 mg | Freq: Once | INTRAVENOUS | Status: AC | PRN
  Administered 2021-06-03: 12.5 mg via INTRAVENOUS
  Filled 2021-06-02: qty 0.5
  Filled 2021-06-02: qty 12.5
  Filled 2021-06-02 (×2): qty 0.5

## 2021-06-02 MED ORDER — SODIUM CHLORIDE 0.9 % IV SOLN: 25.0000 mg | Freq: Once | INTRAVENOUS | Status: DC | PRN

## 2021-06-02 MED ORDER — AMLODIPINE BESYLATE 2.5 MG PO TABS: 2.5000 mg | ORAL_TABLET | Freq: Every day | ORAL | Status: DC

## 2021-06-02 MED ORDER — ACETAMINOPHEN 650 MG RE SUPP
650.0000 mg | RECTAL | Status: DC | PRN
  Administered 2021-06-03 (×3): 650 mg via RECTAL
  Filled 2021-06-02 (×3): qty 1

## 2021-06-02 MED ORDER — LEVETIRACETAM IN NACL 1000 MG/100ML IV SOLN
1000.0000 mg | INTRAVENOUS | Status: AC
  Administered 2021-06-02 (×2): 1000 mg via INTRAVENOUS

## 2021-06-02 MED ORDER — CHLORHEXIDINE GLUCONATE CLOTH 2 % EX PADS: 6.0000 | MEDICATED_PAD | Freq: Every day | CUTANEOUS | Status: DC

## 2021-06-02 MED ORDER — ACETAMINOPHEN 160 MG/5ML PO SOLN: 650.0000 mg | ORAL | Status: DC | PRN

## 2021-06-02 MED ORDER — ACETAMINOPHEN 325 MG PO TABS: 650.0000 mg | ORAL_TABLET | ORAL | Status: DC | PRN

## 2021-06-02 MED ORDER — CLEVIDIPINE BUTYRATE 0.5 MG/ML IV EMUL
0.0000 mg/h | INTRAVENOUS | Status: DC
  Administered 2021-06-02: 1 mg/h via INTRAVENOUS
  Administered 2021-06-03: 10 mg/h via INTRAVENOUS
  Filled 2021-06-02 (×2): qty 50

## 2021-06-02 MED ORDER — ATENOLOL 50 MG PO TABS: 75.0000 mg | ORAL_TABLET | Freq: Every day | ORAL | Status: DC

## 2021-06-02 MED ORDER — ONDANSETRON HCL 4 MG/2ML IJ SOLN
4.0000 mg | Freq: Once | INTRAMUSCULAR | Status: AC
  Administered 2021-06-02: 4 mg via INTRAVENOUS
  Filled 2021-06-02: qty 2

## 2021-06-02 MED ORDER — STROKE: EARLY STAGES OF RECOVERY BOOK: Freq: Once | Status: AC

## 2021-06-02 MED ORDER — SILDENAFIL CITRATE 20 MG PO TABS
20.0000 mg | ORAL_TABLET | Freq: Three times a day (TID) | ORAL | Status: DC
Start: 2021-06-02 — End: 2021-06-03
  Filled 2021-06-02 (×2): qty 1

## 2021-06-02 NOTE — ED Triage Notes (Signed)
Pt BIB EMS Code Stroke - LKW 1500. Pt was found face down on floor - significant laceration to left arm.  EMS reports left sided facial droop and left sided weakness - reports that pt left leg dragging and that is how pt fell. C collar placed.  Pt has hx of afib and hx of brain bleed. Pt not on Eliquis anymore. Pt does take ASA.  2 18s in RFA  170/90 CBG 77

## 2021-06-02 NOTE — ED Notes (Signed)
This RN notified Dr. Cheral Marker that pt was throwing up and that zofran was given. Per Dr. Cheral Marker hold off on MRI for right now. Give the zofran 10 more minutes to work and if that does not work PRN phenergan is ordred.

## 2021-06-02 NOTE — ED Notes (Signed)
Patient urinated on self. While RN and EMT were cleaning patient of incontinence, patient began to projectile vomit. Patient was sat up in bed, emesis bag provided, and suction established. Patient unable to sit up all of the way/c-collar removed due to airway compromise. Staff continued to suction and encourage patient to not hold vomit in his mouth. MD notified of c-collar removal.

## 2021-06-02 NOTE — ED Notes (Signed)
Per Dr. Tomi Bamberger and Dr. Cheral Marker send second visitor for pt

## 2021-06-02 NOTE — ED Provider Notes (Signed)
[place] EMERGENCY DEPARTMENT Provider Note   CSN: [id number] Arrival date & time: 06/11/2021  2039  An emergency department physician performed an initial assessment on this suspected stroke patient at 2040.  History Chief Complaint  Patient presents with   Code Stroke    Barry Woods is a 75 y.o. male.  HPI  Patient presents to the emergency room for cute onset of slurred speech and weakness on the left side of his body.  Patient states the symptoms started this afternoon around 4 PM.  Patient started noticing slurring of his speech.  He then developed left-sided weakness.  Patient states he is not able to move his left arm or left leg.  EMS was called and he was transported to the ED.  Patient was activated as a code stroke.  Past Medical History:  Diagnosis Date   Arthritis    Cerebral cavernoma    Hyperlipidemia    Hypertension    Persistent atrial fibrillation ([place])    Pulmonary arterial hypertension ([place]) 04/2021   Rheumatoid arthritis ([place])    Stroke [facility])     Patient Active Problem List   Diagnosis Date Noted   Hemorrhagic stroke ([place])    Hyponatremia    Dyslipidemia    AKI (acute kidney injury) ([place])    Cerebral cavernoma    ICH (intracerebral hemorrhage) ([place]) 78/29/5621   Diastolic CHF, chronic ([place]) 11/09/2018   Atrial fibrillation ([place]) 11/07/2018   Coronary artery calcification seen on CT scan 10/31/2018   Routine general medical examination at a health care facility 03/09/2018   Benign essential tremor 02/16/2017   Pure hypercholesterolemia 02/16/2017   ILD (interstitial lung disease) ([place]) 10/21/2016   BPH associated with nocturia 09/24/2015   Rheumatoid arthritis ([place]) 09/28/2011   Gout attack 02/02/2011   Familial hematuria 06/13/2010   Hyperlipidemia, mild 11/01/2007   Essential hypertension 11/01/2007    Past Surgical History:  Procedure Laterality Date   COLONOSCOPY     LIPOMA EXCISION     PALATE / UVULA BIOPSY  / EXCISION     TONSILLECTOMY     age 47       Family History  Problem Relation Age of Onset   Colon cancer Mother 68       57   Hepatitis C Father    CAD Father     Social History   Tobacco Use   Smoking status: Never   Smokeless tobacco: Never  Vaping Use   Vaping Use: Never used  Substance Use Topics   Alcohol use: Not Currently    Alcohol/week: 14.0 standard drinks    Types: 14 Cans of beer per week   Drug use: No    Home Medications Prior to Admission medications   Medication Sig Start Date End Date Taking? Authorizing Provider  Adalimumab 40 MG/0.8ML PSKT Inject 40 mg into the skin See admin instructions. Every other week    [provider]  amLODipine (NORVASC) 5 MG tablet TAKE 1 TABLET BY MOUTH ONCE DAILY IN THE EVENING Patient taking differently: Take 2.5 mg by mouth daily. TAKE 0.5 TABLET BY MOUTH ONCE DAILY IN THE EVENING 12/18/20   Burnell Blanks, MD  aspirin EC 81 MG tablet Take 1 tablet (81 mg total) by mouth daily. 01/31/19   Garvin Fila, MD  atenolol (TENORMIN) 50 MG tablet TAKE 1 & 1/2 (ONE & ONE-HALF) TABLETS BY MOUTH ONCE DAILY 09/30/20   Burnell Blanks, MD  predniSONE (DELTASONE) 5 MG tablet  Take 5 mg by mouth daily. 08/08/20   [provider]  sildenafil (REVATIO) 20 MG tablet Take 1 tablet (20 mg total) by mouth 3 (three) times daily. 05/05/21   Deberah Pelton, NP  simvastatin (ZOCOR) 20 MG tablet Take 1 tablet (20 mg total) by mouth at bedtime. 08/09/20   Burnell Blanks, MD    Allergies    Atorvastatin and Cetirizine hcl  Review of Systems   Review of Systems  All other systems reviewed and are negative.  Physical Exam Updated Vital Signs BP (!) 177/93   Pulse 71   Temp 97.8 F (36.6 C) (Oral)   Resp (!) 21   Ht 1.727 m (5\' 8" )   Wt 63.5 kg   SpO2 93%   BMI 21.29 kg/m   Physical Exam Vitals and nursing note reviewed.  Constitutional:      General: He is not in acute distress.     Appearance: He is well-developed.  HENT:     Head: Normocephalic and atraumatic.     Right Ear: External ear normal.     Left Ear: External ear normal.  Eyes:     General: No scleral icterus.       Right eye: No discharge.        Left eye: No discharge.     Conjunctiva/sclera: Conjunctivae normal.  Neck:     Trachea: No tracheal deviation.  Cardiovascular:     Rate and Rhythm: Normal rate and regular rhythm.  Pulmonary:     Effort: Pulmonary effort is normal. No respiratory distress.     Breath sounds: Normal breath sounds. No stridor. No wheezing or rales.  Abdominal:     General: Bowel sounds are normal. There is no distension.     Palpations: Abdomen is soft.     Tenderness: There is no abdominal tenderness. There is no guarding or rebound.  Musculoskeletal:        General: No tenderness.     Cervical back: Neck supple.  Skin:    General: Skin is warm and dry.     Findings: No rash.  Neurological:     Mental Status: He is alert and oriented to person, place, and time.     Cranial Nerves: No cranial nerve deficit (Left-sided facial droop, tongue deviates to the right).     Sensory: Sensory deficit present.     Motor: Weakness present. No abnormal muscle tone or seizure activity.     Coordination: Coordination normal.     Comments: Unable to lift left arm or left leg off the bed, no visual field cuts, no left or right sided neglect,     ED Results / Procedures / Treatments   Labs (all labs ordered are listed, but only abnormal results are displayed) Labs Reviewed  ETHANOL - Abnormal; Notable for the following components:      Result Value   Alcohol, Ethyl (B) 70 (*)    All other components within normal limits  APTT - Abnormal; Notable for the following components:   aPTT 38 (*)    All other components within normal limits  CBC - Abnormal; Notable for the following components:   Platelets 118 (*)    All other components within normal limits  COMPREHENSIVE METABOLIC  PANEL - Abnormal; Notable for the following components:   Sodium 134 (*)    CO2 19 (*)    Calcium 8.8 (*)    Albumin 3.2 (*)    AST 52 (*)  ALT 79 (*)    Total Bilirubin 1.8 (*)    All other components within normal limits  I-STAT CHEM 8, ED - Abnormal; Notable for the following components:   Calcium, Ion 0.99 (*)    TCO2 20 (*)    All other components within normal limits  RESP PANEL BY RT-PCR (FLU A&B, COVID) ARPGX2  PROTIME-INR  DIFFERENTIAL  RAPID URINE DRUG SCREEN, HOSP PERFORMED  URINALYSIS, ROUTINE W REFLEX MICROSCOPIC  CBG MONITORING, ED    EKG None  Radiology CT CERVICAL SPINE WO CONTRAST  Result Date: 06/10/2021 CLINICAL DATA:  Fall EXAM: CT CERVICAL SPINE WITHOUT CONTRAST TECHNIQUE: Multidetector CT imaging of the cervical spine was performed without intravenous contrast. Multiplanar CT image reconstructions were also generated. COMPARISON:  None. FINDINGS: Alignment: No static subluxation. Facets are aligned. Occipital condyles and the lateral masses of C1 and C2 are normally approximated. Skull base and vertebrae: No acute fracture. Soft tissues and spinal canal: No prevertebral fluid or swelling. No visible canal hematoma. Disc levels: No advanced spinal canal or neural foraminal stenosis. Upper chest: No pneumothorax, pulmonary nodule or pleural effusion. Other: Normal visualized paraspinal cervical soft tissues. IMPRESSION: No acute fracture or static subluxation of the cervical spine. Electronically Signed   By: Ulyses Jarred M.D.   On: 06/13/2021 21:05   CT HEAD CODE STROKE WO CONTRAST  Result Date: 06/09/2021 CLINICAL DATA:  Code stroke.  Left-sided weakness. EXAM: CT HEAD WITHOUT CONTRAST TECHNIQUE: Contiguous axial images were obtained from the base of the skull through the vertex without intravenous contrast. COMPARISON:  11/28/2018 FINDINGS: Brain: There is a recurrent acute intraparenchymal hematoma with its epicenter in the right basal ganglia or external  capsule, the same site as an acute hemorrhage in January of 2020. Today this measures 4.7 x 4.3 x 3.1 cm (volume = 33 cm^3). There is mild surrounding edema. Mass-effect upon the right lateral ventricle which is flattened. Right-to-left midline shift of 1 mm. Small amount of intraventricular blood layering in the occipital horn of the right lateral ventricle. Chronic small-vessel ischemic changes seen elsewhere within the hemispheric white matter. Old infarction in the left external capsule region. Vascular: There is atherosclerotic calcification of the major vessels at the base of the brain. Skull: Negative Sinuses/Orbits: Clear/normal Other: None IMPRESSION: 1. 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 4.7 x 4.3 x 3.1 cm measurements today. Mild surrounding edema and mass effect. Small amount of intraventricular blood within the occipital horn of the right lateral ventricle. 2. These results were communicated to Dr. Cheral Marker at 8:52 pm on 06/07/2021 by text page via the [facility] messaging system. Electronically Signed   By: Nelson Chimes M.D.   On: 05/26/2021 20:54    Procedures Procedures   Medications Ordered in ED Medications  levETIRAcetam (KEPPRA) IVPB 1000 mg/100 mL premix (1,000 mg Intravenous New Bag/Given 06/22/2021 2147)  levETIRAcetam (KEPPRA) IVPB 1000 mg/100 mL premix (has no administration in time range)    ED Course  I have reviewed the triage vital signs and the nursing notes.  Pertinent labs & imaging results that were available during my care of the patient were reviewed by me and considered in my medical decision making (see chart for details).  Clinical Course as of 06/15/2021 2149  Mon Jun 02, 2021  2117 Patient has a history of intracerebral hemorrhage.  CT scan today shows recurrent intraparenchymal hemorrhage [JK]    Clinical Course User Index [JK] Tomi Bamberger,  Wille Glaser, MD   MDM  Rules/Calculators/A&P                          Pt presented to the ED for evaluation of an acute stroke.  Patient does have a history of prior hemorrhagic stroke.  Patient has notable left-sided hemiparesis.  CT scan does demonstrate a hemorrhagic stroke.  Patient was seen by the neurology team, Dr. Cheral Marker on arrival.  Patient will be admitted to hospital for further treatment.  I will consult with neurosurgery so they are aware of the patient's condition. Final Clinical Impression(s) / ED Diagnoses Final diagnoses:  Hemorrhagic stroke [facility])     Dorie Rank, MD 06/12/2021 2152

## 2021-06-02 NOTE — Progress Notes (Signed)
   Providing Compassionate, Quality Care - Together  BP (!) 161/92   Pulse 69   Temp 97.8 F (36.6 C) (Oral)   Resp 15   Ht 5\' 8"  (1.727 m)   Wt 63.5 kg   SpO2 94%   BMI 21.29 kg/m    CT CERVICAL SPINE WO CONTRAST  Result Date: 05/29/2021 CLINICAL DATA:  Fall EXAM: CT CERVICAL SPINE WITHOUT CONTRAST TECHNIQUE: Multidetector CT imaging of the cervical spine was performed without intravenous contrast. Multiplanar CT image reconstructions were also generated. COMPARISON:  None. FINDINGS: Alignment: No static subluxation. Facets are aligned. Occipital condyles and the lateral masses of C1 and C2 are normally approximated. Skull base and vertebrae: No acute fracture. Soft tissues and spinal canal: No prevertebral fluid or swelling. No visible canal hematoma. Disc levels: No advanced spinal canal or neural foraminal stenosis. Upper chest: No pneumothorax, pulmonary nodule or pleural effusion. Other: Normal visualized paraspinal cervical soft tissues. IMPRESSION: No acute fracture or static subluxation of the cervical spine. Electronically Signed   By: Ulyses Jarred M.D.   On: 06/16/2021 21:05   CT HEAD CODE STROKE WO CONTRAST  Result Date: 06/14/2021 CLINICAL DATA:  Code stroke.  Left-sided weakness. EXAM: CT HEAD WITHOUT CONTRAST TECHNIQUE: Contiguous axial images were obtained from the base of the skull through the vertex without intravenous contrast. COMPARISON:  11/28/2018 FINDINGS: Brain: There is a recurrent acute intraparenchymal hematoma with its epicenter in the right basal ganglia or external capsule, the same site as an acute hemorrhage in January of 2020. Today this measures 4.7 x 4.3 x 3.1 cm (volume = 33 cm^3). There is mild surrounding edema. Mass-effect upon the right lateral ventricle which is flattened. Right-to-left midline shift of 1 mm. Small amount of intraventricular blood layering in the occipital horn of the right lateral ventricle. Chronic small-vessel ischemic changes seen  elsewhere within the hemispheric white matter. Old infarction in the left external capsule region. Vascular: There is atherosclerotic calcification of the major vessels at the base of the brain. Skull: Negative Sinuses/Orbits: Clear/normal Other: None IMPRESSION: 1. 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 4.7 x 4.3 x 3.1 cm measurements today. Mild surrounding edema and mass effect. Small amount of intraventricular blood within the occipital horn of the right lateral ventricle. 2. These results were communicated to Dr. Cheral Marker at 8:52 pm on 06/14/2021 by text page via the [facility] messaging system. Electronically Signed   By: Nelson Chimes M.D.   On: 05/30/2021 20:54     Patient with left-sided hemiplegia, dysarthria, and right gaze by report. CT head reviewed, showing recurrent intraparenchymal hemorrhage in the right basal ganglia, with small amount of intraventricular blood. No surgical intervention recommended at this time. Recommend strict BP control (SBP less than 140) and admission to the Neuro ICU for frequent neuro checks.   Viona Gilmore, DNP, AGNP-C Nurse Practitioner 05/31/2021 10:18 PM   [facility] [place] [address], [address], [place], [place] [address] P: [phone]    F: [phone]

## 2021-06-02 NOTE — ED Notes (Signed)
Pt vomiting again, zofran given

## 2021-06-02 NOTE — ED Notes (Signed)
Pt vomiting at this time - will fail swallow screen at this moment

## 2021-06-02 NOTE — ED Notes (Signed)
Dr. Lindzen at bedside. 

## 2021-06-02 NOTE — Code Documentation (Signed)
Stroke Response Nurse Documentation Code Documentation  Barry Woods is a 74 y.o. male arriving to [name] ED via [facility] on 7/11 with past medical hx of Afib, Prior ICH, RA, HLD. Code stroke was activated by EMS. Patient from home where he was LKW at 1500 and now complaining of Left hemiplegia, dysarthria and right sided gaze . On aspirin 81 mg daily. Stroke team at the bedside on patient arrival. Labs drawn and patient cleared for CT by Dr. Tomi Bamberger. Patient to CT with team. NIHSS 16, see documentation for details and code stroke times. Patient with right gaze preference , left hemianopia, left facial droop, left arm weakness, left leg weakness, left decreased sensation, dysarthria , and Sensory  neglect on exam. The following imaging was completed:  CTA Neck only because pt had recent MRA. Patient is not a candidate for tPA due to Hemorrhage. Bedside handoff with ED RN Danae Chen.    Madelynn Done  Rapid Response RN

## 2021-06-02 NOTE — H&P (Addendum)
History and Physical Examination Neurohospitalist Service   Referring Physician: Dr. Tomi Bamberger    Chief Complaint: Acute onset of left hemiplegia. Patient found down   HPI: Barry Woods is an 75 y.o. male with a PMHx of cerebral cavernoma, HLD, HTN, perisitent a-fib, pulmonary arterial hypertension, RA and right basal ganglia hemorrhage (2020), presenting to the ED via EMS as a Code Stroke after he was found on the floor at home with left hemiplegia and abrasions to his left arm. He did not appear to have suffered any head trauma per EMS. BP was 170/90 and CBG 79 en route.   The patient is able to converse on arrival to the ED. He states that at baseline he is ambulatory without a cane despite the prior ICH. He is fully independent and able to perform all of his ADLs. He was formerly on Eliquis which had been stopped after he sustained the ICH. He fell while at home due to his LLE giving out. He states that the floor he fell on is a hard surface.   CBC with normal white count, Hgb, but with low platelet count of 118.   Regarding his prior right ICH, it appears on CT to be almost identical in location and size to the current hemorrrhage.   CT head: Recurrent intraparenchymal hemorrhage in the right basal ganglia/external capsule, similar in location to the study of January 2020. 4.7 x 4.3 x 3.1 cm measurements today. Mild surrounding edema and mass effect. Small amount of intraventricular blood within the occipital horn of the right lateral ventricle.  CT cervical spine: No acute fracture or static subluxation of the cervical spine.  LSN: 1500 tPA Given: No: Right intraparenchymal acute bleed seen on CT.   Past Medical History:  Diagnosis Date   Arthritis    Cerebral cavernoma    Hyperlipidemia    Hypertension    Persistent atrial fibrillation ([facility])    Pulmonary arterial hypertension ([place]) 04/2021   Rheumatoid arthritis ([place])    Stroke [facility])     Past Surgical History:   Procedure Laterality Date   COLONOSCOPY     LIPOMA EXCISION     PALATE / UVULA BIOPSY / EXCISION     TONSILLECTOMY     age 64    Family History  Problem Relation Age of Onset   Colon cancer Mother 72       57   Hepatitis C Father    CAD Father    Social History:  reports that he has never smoked. He has never used smokeless tobacco. He reports previous alcohol use of about 14.0 standard drinks of alcohol per week. He reports that he does not use drugs.  Allergies:  Allergies  Allergen Reactions   Atorvastatin Hives   Cetirizine Hcl Hives    Home Medications:  No current facility-administered medications on file prior to encounter.   Current Outpatient Medications on File Prior to Encounter  Medication Sig Dispense Refill   Adalimumab 40 MG/0.8ML PSKT Inject 40 mg into the skin See admin instructions. Every other week     amLODipine (NORVASC) 5 MG tablet TAKE 1 TABLET BY MOUTH ONCE DAILY IN THE EVENING (Patient taking differently: Take 2.5 mg by mouth daily. TAKE 0.5 TABLET BY MOUTH ONCE DAILY IN THE EVENING) 90 tablet 3   aspirin EC 81 MG tablet Take 1 tablet (81 mg total) by mouth daily. 90 tablet 1   atenolol (TENORMIN) 50 MG tablet TAKE 1 & 1/2 (ONE & ONE-HALF) TABLETS  BY MOUTH ONCE DAILY 135 tablet 3   predniSONE (DELTASONE) 5 MG tablet Take 5 mg by mouth daily.     sildenafil (REVATIO) 20 MG tablet Take 1 tablet (20 mg total) by mouth 3 (three) times daily. 90 tablet 3   simvastatin (ZOCOR) 20 MG tablet Take 1 tablet (20 mg total) by mouth at bedtime. 90 tablet 3    ROS: Currently has a headache to the back of his head. Other ROS as per HPI. Detailed ROS deferred due to acuity of presentation.   Physical Examination: There were no vitals taken for this visit.  HEENT: [place]/AT Lungs: Respirations unlabored Ext: Laceration to LUE noted.   Neurologic Examination: Mental Status: Awake and alert. States the year is "2024" and perseverates with this answer when  corrected. Does know the day of the week, month, city and state. Able to name objects correctly. Attention is fair. Able to answer questions about his PMHx but is occasionally perseverative and hard to redirect at times. Speech is dysarthric but fluent. Left hemineglect noted. Able to follow all commands. Repetition is impaired.  Cranial Nerves: II:  Visual fields: Left sided visual field cut. PERRL.  III,IV, VI: No ptosis. Right sided gaze deviation. Unable to cross to the left volitionally. Unable to assess for oculocephalic reflex due to c-collar.  V,VII: Left facial droop. Decreased awareness of sensory stimuli on the left.  VIII: Hearing intact to voice IX,X: Mild pharyngeal dysarthria XI: Head preferentially rotated to the right XII: Tongue deviates slightly to the RIGHT when protruded Motor: RUE 5/5 RLE 5/5 LUE 0/5 LLE 0/5 but with intermittent myoclonic-like jerks occurring on average about once every 30 seconds.  Sensory: States he can feel temp sensation equally to BUE and BLE, but is insensate to FT on the left.  Deep Tendon Reflexes:  2+ bilateral brachioradialis and patellae. 0 achilles.  Right toe downgoing, left toe upgoing.  Cerebellar: No ataxia with FNF on the right. Unable to perform on the left.  Gait: Unable to assess  Results for orders placed or performed during the hospital encounter of 05/25/2021 (from the past 48 hour(s))  CBG monitoring, ED     Status: None   Collection Time: 06/01/2021  8:41 PM  Result Value Ref Range   Glucose-Capillary 84 70 - 99 mg/dL    Comment: Glucose reference range applies only to samples taken after fasting for at least 8 hours.   No results found.  Assessment: 75 y.o. male presenting with acute right basal ganglia hemorrhage.  1. Exam reveals left hemiplegia, left hemineglect and left sided sensory loss 2. Developed nausea with vomiting in the ED.  3. CT head: Recurrent intraparenchymal hemorrhage in the right basal ganglia/external  capsule, similar in location to the study of January 2020. 4.7 x 4.3 x 3.1 cm measurements today. Mild surrounding edema and mass effect. Small amount of intraventricular blood within the occipital horn of the right lateral ventricle. 4. A-fib on EMS rhythm strip. Has a known diagnosis of atrial fibrillation.  5. Recent diagnosis of pulmonary HTN (2 weeks ago).  6. Platelets are low at 118. Will consider transfusion if repeat platelet count is less than 100k. He was on ASA, but per the literature there is little evidence that antiplatelet reversal with platelet transfusion improves outcomes. 7. MRA in 2020 showed no aneurysm.  8. EtOH level of 70 9. CT cervical spine: No acute fracture or static subluxation of the cervical spine.  Plan: 1. Admit to ICU under Neurology  service 2. Repeat CT head in 6 hours 3. EEG in AM.  4. Frequent neuro checks 5. STAT Keppra load 2000 mg IV. Continue with scheduled Keppra dosing at 1000 mg IV BID. 6. Discontinue ASA 7. Continue sildenafil for his pulmonary HTN (prescribed about 2 weeks ago).  8. Neurosurgery consult 9. Zofran 4 mg IV for nausea with vomiting.  10. Tylenol for headache pain. May need to escalate if ineffective.  11. MRI of head 12. PT consult, OT consult, Speech consult 13. Cardiac telemetry 14. If he clinically worsens or repeat CT head shows hemorrhage extension, will consider starting hypertonic saline 3% at 50 cc/hr 15. BP management with clevidipine drip  16. No antiplatelet medications or anticoagulants. DVT prophylaxis with SCDs  17. Discontinuing his statin as there is a somewhat higher risk of recurrent ICH in patients taking statins, per the literature.   90 minutes spent in the emergent neurological evaluation and management of this critically ill patient  @Electronically  signed: Dr. Kerney Elbe  05/30/2021, 8:45 PM

## 2021-06-03 ENCOUNTER — Inpatient Hospital Stay (HOSPITAL_COMMUNITY): Payer: Medicare HMO

## 2021-06-03 DIAGNOSIS — R569 Unspecified convulsions: Secondary | ICD-10-CM

## 2021-06-03 DIAGNOSIS — T17908A Unspecified foreign body in respiratory tract, part unspecified causing other injury, initial encounter: Secondary | ICD-10-CM | POA: Diagnosis not present

## 2021-06-03 DIAGNOSIS — I517 Cardiomegaly: Secondary | ICD-10-CM | POA: Diagnosis not present

## 2021-06-03 DIAGNOSIS — I610 Nontraumatic intracerebral hemorrhage in hemisphere, subcortical: Secondary | ICD-10-CM

## 2021-06-03 DIAGNOSIS — I700 Atherosclerosis of aorta: Secondary | ICD-10-CM | POA: Diagnosis not present

## 2021-06-03 DIAGNOSIS — I611 Nontraumatic intracerebral hemorrhage in hemisphere, cortical: Secondary | ICD-10-CM | POA: Diagnosis not present

## 2021-06-03 DIAGNOSIS — R4182 Altered mental status, unspecified: Secondary | ICD-10-CM | POA: Diagnosis not present

## 2021-06-03 DIAGNOSIS — J9811 Atelectasis: Secondary | ICD-10-CM | POA: Diagnosis not present

## 2021-06-03 LAB — BASIC METABOLIC PANEL
Anion gap: 11 (ref 5–15)
BUN: 8 mg/dL (ref 8–23)
CO2: 24 mmol/L (ref 22–32)
Calcium: 8.6 mg/dL — ABNORMAL LOW (ref 8.9–10.3)
Chloride: 99 mmol/L (ref 98–111)
Creatinine, Ser: 0.73 mg/dL (ref 0.61–1.24)
GFR, Estimated: >60 mL/min (ref >60)
Glucose, Bld: 105 mg/dL — ABNORMAL HIGH (ref 70–99)
Potassium: 3.6 mmol/L (ref 3.5–5.1)
Sodium: 134 mmol/L — ABNORMAL LOW (ref 135–145)

## 2021-06-03 LAB — CBC
HCT: 39.3 % (ref 39.0–52.0)
Hemoglobin: 13.6 g/dL (ref 13.0–17.0)
MCH: 31.6 pg (ref 26.0–34.0)
MCHC: 34.6 g/dL (ref 30.0–36.0)
MCV: 91.2 fL (ref 80.0–100.0)
Platelets: 121 10*3/uL — ABNORMAL LOW (ref 150–400)
RBC: 4.31 MIL/uL (ref 4.22–5.81)
RDW: 14.2 % (ref 11.5–15.5)
WBC: 9.7 10*3/uL (ref 4.0–10.5)
nRBC: 0 % (ref 0.0–0.2)

## 2021-06-03 LAB — PHOSPHORUS: Phosphorus: 3.1 mg/dL (ref 2.5–4.6)

## 2021-06-03 LAB — MRSA NEXT GEN BY PCR, NASAL: MRSA by PCR Next Gen: NOT DETECTED

## 2021-06-03 LAB — MAGNESIUM: Magnesium: 1.4 mg/dL — ABNORMAL LOW (ref 1.7–2.4)

## 2021-06-03 MED ORDER — SODIUM CHLORIDE 3 % IV SOLN
INTRAVENOUS | Status: DC
  Filled 2021-06-03: qty 500

## 2021-06-03 MED ORDER — ETOMIDATE 2 MG/ML IV SOLN
INTRAVENOUS | Status: AC
  Filled 2021-06-03: qty 20

## 2021-06-03 MED ORDER — SODIUM CHLORIDE 23.4 % INJECTION (4 MEQ/ML) FOR IV ADMINISTRATION
120.0000 meq | Freq: Once | INTRAVENOUS | Status: DC
  Filled 2021-06-03: qty 30

## 2021-06-03 MED ORDER — MAGNESIUM SULFATE 2 GM/50ML IV SOLN
2.0000 g | Freq: Once | INTRAVENOUS | Status: AC
  Administered 2021-06-03: 2 g via INTRAVENOUS
  Filled 2021-06-03: qty 50

## 2021-06-03 MED ORDER — GLYCOPYRROLATE 0.2 MG/ML IJ SOLN: 0.2000 mg | INTRAMUSCULAR | Status: DC | PRN

## 2021-06-03 MED ORDER — SUCCINYLCHOLINE CHLORIDE 200 MG/10ML IV SOSY
PREFILLED_SYRINGE | INTRAVENOUS | Status: AC
  Filled 2021-06-03: qty 10

## 2021-06-03 MED ORDER — ONDANSETRON HCL 4 MG/2ML IJ SOLN
4.0000 mg | Freq: Once | INTRAMUSCULAR | Status: AC
  Administered 2021-06-03: 4 mg via INTRAVENOUS
  Filled 2021-06-03: qty 2

## 2021-06-03 MED ORDER — MIDAZOLAM HCL 2 MG/2ML IJ SOLN
INTRAMUSCULAR | Status: AC
  Filled 2021-06-03: qty 2

## 2021-06-03 MED ORDER — GLYCOPYRROLATE 0.2 MG/ML IJ SOLN
0.2000 mg | INTRAMUSCULAR | Status: DC | PRN
  Administered 2021-06-03 (×2): 0.2 mg via INTRAVENOUS
  Filled 2021-06-03 (×2): qty 1

## 2021-06-03 MED ORDER — MORPHINE SULFATE (PF) 2 MG/ML IV SOLN
2.0000 mg | INTRAVENOUS | Status: DC | PRN
  Administered 2021-06-03 (×4): 4 mg via INTRAVENOUS
  Filled 2021-06-03 (×4): qty 2

## 2021-06-03 MED ORDER — ORAL CARE MOUTH RINSE
15.0000 mL | Freq: Two times a day (BID) | OROMUCOSAL | Status: DC
  Administered 2021-06-03 (×2): 15 mL via OROMUCOSAL

## 2021-06-03 MED ORDER — DIPHENHYDRAMINE HCL 50 MG/ML IJ SOLN: 25.0000 mg | INTRAMUSCULAR | Status: DC | PRN

## 2021-06-03 MED ORDER — GLYCOPYRROLATE 1 MG PO TABS
1.0000 mg | ORAL_TABLET | ORAL | Status: DC | PRN
  Filled 2021-06-03: qty 1

## 2021-06-03 MED ORDER — PROCHLORPERAZINE EDISYLATE 10 MG/2ML IJ SOLN
5.0000 mg | Freq: Once | INTRAMUSCULAR | Status: AC
  Administered 2021-06-03: 5 mg via INTRAVENOUS
  Filled 2021-06-03: qty 2

## 2021-06-03 MED ORDER — POLYVINYL ALCOHOL 1.4 % OP SOLN
1.0000 [drp] | Freq: Four times a day (QID) | OPHTHALMIC | Status: DC | PRN
  Filled 2021-06-03: qty 15

## 2021-06-03 MED ORDER — MANNITOL 20 % IV SOLN
35.0000 g | Freq: Once | Status: AC
  Administered 2021-06-03: 35 g via INTRAVENOUS
  Filled 2021-06-03: qty 175

## 2021-06-04 ENCOUNTER — Telehealth: Payer: Self-pay | Admitting: Adult Health

## 2021-06-04 NOTE — Telephone Encounter (Signed)
FYI

## 2021-06-04 NOTE — Telephone Encounter (Signed)
Faustino Congress (daughter) called to let Dorothyann Peng know that the patient passed away last night.

## 2021-06-23 NOTE — Progress Notes (Addendum)
STAT repeat CT of head reveals significantly increased size of the acute right basal ganglia hemorrhage with initial stages of uncal herniation per wet read discussed with Radiology.   STAT 20% mannitol bolus at 0.5 g/kg for impending cerebral herniation has been ordered. This will be followed by continuous infusion of 3% saline at 50 cc/hr.   Neurosurgery has been paged.   Discussed updates to plan of care and what is likely to be a poor long term prognosis with patient's daughter. All questions answered.   Electronically signed: Dr. Kerney Elbe

## 2021-06-23 NOTE — Progress Notes (Signed)
03/09/09 Family called nurse. Upon assessment, Patient noted with no pulse and respiration. Patient expired. Verified with another nurse Gypsy Lore ,RN). 3 daughters in the room with pt.   [place]. Spoke to [facility]- she stated that she will notify Dr. Trevor Mace about patient's death.  2343/03/10 called Kelli Churn - Referral no. [phone]

## 2021-06-23 NOTE — Discharge Summary (Signed)
DEATH SUMMARY   Patient Details  Name: Barry Woods MRN: [id number] DOB: Apr 13, 1946  Admission/Discharge Information   Admit Date:  06/17/2021  Date of Death: Date of Death: 06/04/2021  Time of Death: Time of Death: Feb 09, 2309  Length of Stay: 1  Referring Physician: Dorothyann Peng, NP   Reason(s) for Hospitalization  Intracerebral hemorrhage  Diagnoses  Preliminary cause of death: intracerebral hemorrhage.  Secondary Diagnoses (including complications and co-morbidities):  Principal Problem:   ICH (intracerebral hemorrhage) ([place]) Active Problems:   Aspiration into airway   Brief Hospital Course (including significant findings, care, treatment, and services provided and events leading to death)  Barry Woods is a 75 y.o. year old male who was found down at home with left hemiplegia. Initially he was alert and oriented but became increasingly stuporous. CT head demonstrated a right basal ganglia hemorrhage in a similar location to his previous bleed from which he had made an excellent recovery. He has recently been diagnosed with PAH and was increasingly dyspneic on exertion.   Neurosurgery did not feel intervention was appropriate. Midline shift continued to worsen on follow up CT.  The prognosis was deemed poor by Neurology, Neurosurgery and Neurocritical Care. Given his declining mental status and taking into account his recent progressive dyspnea and functional decline, the family opted to transition to comfort care and the patient passed away that evening.   Pertinent Labs and Studies  Significant Diagnostic Studies CT HEAD WO CONTRAST  Result Date: 2021-06-04 CLINICAL DATA:  Mental status change EXAM: CT HEAD WITHOUT CONTRAST TECHNIQUE: Contiguous axial images were obtained from the base of the skull through the vertex without intravenous contrast. COMPARISON:  CT 05/25/2021 FINDINGS: Brain: Significant interval increase in size of a recurrent intraparenchymal hematoma  centered in the right basal ganglia/external capsule. This measures approximately 5.4 x 4.1 x 6 cm in size compared to 4.7 x 3.1 x 4.3 cm when measured on similar levels to comparison imaging. There appears to be direct extension of hyperdense hemorrhage into the anterior horn of the right lateral ventricle with additional hyperdense hemorrhage layering dependently within both lateral ventricles as well as within the third and fourth ventricles. Increasing subdural blood noted along the falx and tentorium. Increasing mass effect with effacement of the ventricles and sulci throughout the right cerebral hemisphere. Increasing with right to left midline shift of approximately 14 mm at this time. Some partial transtentorial migration of the right uncus and lateralization of the brainstem. Basal cisterns remain patent. Vascular: Atherosclerotic calcification of the carotid siphons and intradural vertebral arteries. Skull: No calvarial fracture or suspicious osseous lesion. No scalp swelling or hematoma. Sinuses/Orbits: Paranasal sinuses and mastoid air cells are predominantly clear. Included orbital structures are unremarkable. Other: None. IMPRESSION: Increasing size of the large intraparenchymal hemorrhage centered in the right basal ganglia with intraventricular extension as well as hyperdense hemorrhage along the falx and tentorium. Worsening mass effect with right to left midline shift of nearly 14 mm as well as medialization and transtentorial migration of the the right uncus. Critical Value/emergent results were called by telephone at the time of interpretation on 2021/06/04 at 2:17 am to provider ERIC [facility] , who verbally acknowledged these results. Electronically Signed   By: Lovena Le M.D.   On: 06/04/2021 02:17   CT CERVICAL SPINE WO CONTRAST  Result Date: 06/04/2021 CLINICAL DATA:  Fall EXAM: CT CERVICAL SPINE WITHOUT CONTRAST TECHNIQUE: Multidetector CT imaging of the cervical spine was performed  without intravenous contrast. Multiplanar CT image reconstructions  were also generated. COMPARISON:  None. FINDINGS: Alignment: No static subluxation. Facets are aligned. Occipital condyles and the lateral masses of C1 and C2 are normally approximated. Skull base and vertebrae: No acute fracture. Soft tissues and spinal canal: No prevertebral fluid or swelling. No visible canal hematoma. Disc levels: No advanced spinal canal or neural foraminal stenosis. Upper chest: No pneumothorax, pulmonary nodule or pleural effusion. Other: Normal visualized paraspinal cervical soft tissues. IMPRESSION: No acute fracture or static subluxation of the cervical spine. Electronically Signed   By: Ulyses Jarred M.D.   On: 06/18/2021 21:05   DG CHEST PORT 1 VIEW  Result Date: 13-Jun-2021 CLINICAL DATA:  75 year old male with concern for aspiration. EXAM: PORTABLE CHEST 1 VIEW COMPARISON:  Chest radiograph dated 08/25/2016 and CT dated 04/20/2017. FINDINGS: There is cardiomegaly with vascular congestion. Left lung base atelectasis. No focal consolidation, pleural effusion, or pneumothorax. Atherosclerotic calcification of the aortic arch. No acute osseous pathology. IMPRESSION: Cardiomegaly with vascular congestion. No focal consolidation. Electronically Signed   By: Anner Crete M.D.   On: 2021-06-13 01:30   EEG adult  Result Date: Jun 13, 2021 Lora Havens, MD     06-13-21  9:51 AM Patient Name: Barry Woods MRN: [id number] Epilepsy Attending: Lora Havens Referring Physician/Provider: Dr Kerney Elbe Date: June 13, 2021 Duration: 22.04 mins Patient history: 75 year old male with right basal ganglia hemorrhage.  EEG to evaluate for seizures. Level of alertness:  lethargic AEDs during EEG study: [place] Technical aspects: This EEG study was done with scalp electrodes positioned according to the 10-20 International system of electrode placement. Electrical activity was acquired at a sampling rate of 500Hz  and  reviewed with a high frequency filter of 70Hz  and a low frequency filter of 1Hz . EEG data were recorded continuously and digitally stored. Description: No clear posterior dominant rhythm was seen. EEG showed continuous generalized and maximal right temporal region mixed frequencies with predominantly 5-9Hz  theta-alpha activity as well as intermittent right temporal 2 to 3 Hz delta slowing. Patient was noted to have multiple episodes of right upper extremity twitching. Concomitant EEG before, during and after the event did not show any EEG changes suggest seizure. Hyperventilation and photic stimulation were not performed.   ABNORMALITY - Continuous slow, generalized and maximal right temporal region IMPRESSION: This study is suggestive of cortical dysfunction in right temporal region likely secondary to underlying structural abnormality/bleed. Additionally there is moderate diffuse encephalopathy, nonspecific etiology. Multiple episodes of right upper extremity twitching were noted without concomitant EEG change.  However, focal motor seizures may not be seen on scalp EEG.  Therefore clinical correlation is recommended.  No definite seizures or epileptiform discharges were seen throughout the recording. Lora Havens   CT HEAD CODE STROKE WO CONTRAST  Result Date: 06/20/2021 CLINICAL DATA:  Code stroke.  Left-sided weakness. EXAM: CT HEAD WITHOUT CONTRAST TECHNIQUE: Contiguous axial images were obtained from the base of the skull through the vertex without intravenous contrast. COMPARISON:  11/28/2018 FINDINGS: Brain: There is a recurrent acute intraparenchymal hematoma with its epicenter in the right basal ganglia or external capsule, the same site as an acute hemorrhage in January of 2020. Today this measures 4.7 x 4.3 x 3.1 cm (volume = 33 cm^3). There is mild surrounding edema. Mass-effect upon the right lateral ventricle which is flattened. Right-to-left midline shift of 1 mm. Small amount of  intraventricular blood layering in the occipital horn of the right lateral ventricle. Chronic small-vessel ischemic changes seen elsewhere within the hemispheric white matter. 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 Vascular: There is atherosclerotic calcification of the major vessels at the base of the brain. Skull: Negative Sinuses/Orbits: Clear/normal Other: None IMPRESSION: 1. 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 4.7 x 4.3 x 3.1 cm measurements today. Mild surrounding edema and mass effect. Small amount of intraventricular blood within the occipital horn of the right lateral ventricle. 2. These results were communicated to Dr. Cheral Marker at 8:52 pm on 06/19/2021 by text page via the [facility] messaging system. Electronically Signed   By: Nelson Chimes M.D.   On: 06/12/2021 20:54    Microbiology Recent Results (from the past 240 hour(s))  Resp Panel by RT-PCR (Flu A&B, Covid) Nasopharyngeal Swab     Status: None   Collection Time: 06/18/2021  8:43 PM   Specimen: Nasopharyngeal Swab; Nasopharyngeal(NP) swabs in vial transport medium  Result Value Ref Range Status   SARS Coronavirus 2 by RT PCR NEGATIVE NEGATIVE Final    Comment: (NOTE) SARS-CoV-2 target nucleic acids are NOT DETECTED.  The SARS-CoV-2 RNA is generally detectable in upper respiratory specimens during the acute phase of infection. The lowest concentration of SARS-CoV-2 viral copies this assay can detect is 138 copies/mL. A negative result does not preclude SARS-Cov-2 infection and should not be used as the sole basis for treatment or other patient management decisions. A negative result may occur with  improper specimen collection/handling, submission of specimen other than nasopharyngeal swab, presence of viral mutation(s) within the areas targeted by this assay, and inadequate number of viral copies(<138 copies/mL). A negative  result must be combined with clinical observations, patient history, and epidemiological information. The expected result is Negative.  Fact Sheet for Patients:  EntrepreneurPulse.com.au  Fact Sheet for Healthcare Providers:  IncredibleEmployment.be  This test is no t yet approved or cleared by the [place] FDA and  has been authorized for detection and/or diagnosis of SARS-CoV-2 by FDA under an Emergency Use Authorization (EUA). This EUA will remain  in effect (meaning this test can be used) for the duration of the COVID-19 declaration under Section 564(b)(1) of the Act, 21 U.S.C.section 360bbb-3(b)(1), unless the authorization is terminated  or revoked sooner.       Influenza A by PCR NEGATIVE NEGATIVE Final   Influenza B by PCR NEGATIVE NEGATIVE Final    Comment: (NOTE) The Xpert Xpress SARS-CoV-2/FLU/RSV plus assay is intended as an aid in the diagnosis of influenza from Nasopharyngeal swab specimens and should not be used as a sole basis for treatment. Nasal washings and aspirates are unacceptable for Xpert Xpress SARS-CoV-2/FLU/RSV testing.  Fact Sheet for Patients: EntrepreneurPulse.com.au  Fact Sheet for Healthcare Providers: IncredibleEmployment.be  This test is not yet approved or cleared by the [place] FDA and has been authorized for detection and/or diagnosis of SARS-CoV-2 by FDA under an Emergency Use Authorization (EUA). This EUA will remain in effect (meaning this test can be used) for the duration of the COVID-19 declaration under Section 564(b)(1) of the Act, 21 U.S.C. section 360bbb-3(b)(1), unless the authorization is terminated or revoked.  Performed at [facility], [place] [address] [place], [place] [address]   MRSA Next Gen by PCR, Nasal     Status: None   Collection Time: 05/23/2021 11:49 PM   Specimen: Nasal Mucosa; Nasal Swab  Result Value Ref Range Status    MRSA by PCR Next Gen NOT DETECTED NOT DETECTED Final    Comment: (NOTE) The GeneXpert  MRSA Assay (FDA approved for NASAL specimens only), is one component of a comprehensive MRSA colonization surveillance program. It is not intended to diagnose MRSA infection nor to guide or monitor treatment for MRSA infections. Test performance is not FDA approved in patients less than 77 years old. Performed at [facility], [place] [address] [place], [place] [address]     Lab Basic Metabolic Panel: No results for input(s): NA, K, CL, CO2, GLUCOSE, BUN, CREATININE, CALCIUM, MG, PHOS in the last 168 hours. Liver Function Tests: No results for input(s): AST, ALT, ALKPHOS, BILITOT, PROT, ALBUMIN in the last 168 hours. No results for input(s): LIPASE, AMYLASE in the last 168 hours. No results for input(s): AMMONIA in the last 168 hours. CBC: No results for input(s): WBC, NEUTROABS, HGB, HCT, MCV, PLT in the last 168 hours. Cardiac Enzymes: No results for input(s): CKTOTAL, CKMB, CKMBINDEX, TROPONINI in the last 168 hours. Sepsis Labs: No results for input(s): PROCALCITON, WBC, LATICACIDVEN in the last 168 hours.  Procedures/Operations  none   Omesha Bowerman 06/12/2021, 7:49 AM

## 2021-06-23 NOTE — Progress Notes (Signed)
EEG complete - results pending 

## 2021-06-23 NOTE — Progress Notes (Signed)
At 0135, pt had a mental status change along with new expressive aphasia. Pt tremors getting increasingly worse. Headache and nausea worsening. Called Neuro MD. Pt went for STAT Head CT. Consulted CCM for central line placement. Central line placed. 3% started.

## 2021-06-23 NOTE — Progress Notes (Signed)
OT Cancellation Note  Patient Details Name: Barry Woods MRN: [id number] DOB: [birth year]   Cancelled Treatment:    Reason Eval/Treat Not Completed: Patient not medically ready (Per RN, pt not responsive at this time. OT to follow as appropriate for OT eval.)  Jefferey Pica, OTR/L Acute Rehabilitation Services Pager: [phone] Office: [phone]   Audie Pinto 2021/06/11, 8:26 AM

## 2021-06-23 NOTE — Procedures (Signed)
Central Venous Catheter Insertion Procedure Note  Barry Woods  [id number]  1946/10/05  Date:Jun 06, 2021  Time:4:11 AM   Provider Performing:Jehan Bonano Jerilynn Mages Dewaine Oats   Procedure: Insertion of Non-tunneled Central Venous [id number]) with US guidance (68088)   Indication(s) Medication administration  Consent Risks of the procedure as well as the alternatives and risks of each were explained to the patient and/or caregiver.  Consent for the procedure was obtained and is signed in the bedside chart  Anesthesia Topical only with 1% lidocaine   Timeout Verified patient identification, verified procedure, site/side was marked, verified correct patient position, special equipment/implants available, medications/allergies/relevant history reviewed, required imaging and test results available.  Sterile Technique Maximal sterile technique including full sterile barrier drape, hand hygiene, sterile gown, sterile gloves, mask, hair covering, sterile ultrasound probe cover (if used).  Procedure Description Area of catheter insertion was cleaned with chlorhexidine and draped in sterile fashion.  With real-time ultrasound guidance a central venous catheter was placed into the left femoral vein. Nonpulsatile blood flow and easy flushing noted in all ports.  The catheter was sutured in place and sterile dressing applied.  Complications/Tolerance None; patient tolerated the procedure well. Chest X-ray is ordered to verify placement for internal jugular or subclavian cannulation.   Chest x-ray is not ordered for femoral cannulation.  EBL Minimal  Specimen(s) None

## 2021-06-23 NOTE — Progress Notes (Signed)
PRN robinul given to decrease secretions. Rosary Lively also provided. Family at bedside.

## 2021-06-23 NOTE — Progress Notes (Signed)
PT Cancellation Note  Patient Details Name: DOHN STCLAIR MRN: [id number] DOB: [birth year]   Cancelled Treatment:    Reason Eval/Treat Not Completed: Medical issues which prohibited therapy (enlargement of right basal ganglia hemorrhage; pt not responsive per RN).  Wyona Almas, PT, DPT Acute Rehabilitation Services Pager [phone] Office [phone]    Deno Etienne 06/15/2021, 8:09 AM

## 2021-06-23 NOTE — Progress Notes (Signed)
[facility] Progress Note Patient Name: Barry Woods DOB: 03-15-1946 MRN: [id number]   Date of Service  06-28-2021  HPI/Events of Note  Patient is now comfort measures only.  eICU Interventions  Active medications and other treatment measures discontinued.        [name] Ananth Fiallos 06-28-21, 9:30 PM

## 2021-06-23 NOTE — Progress Notes (Signed)
STROKE TEAM PROGRESS NOTE   SUBJECTIVE (INTERVAL HISTORY) His daughter is at the bedside. Dr. Lynetta Mare has discussed with family and they requested comfort care measures given the poor prognosis.    OBJECTIVE Temp:  [97.7 F (36.5 C)-102.2 F (39 C)] 101.8 F (38.8 C) (07/12 0800) Pulse Rate:  [57-109] 84 (07/12 1100) Cardiac Rhythm: Atrial fibrillation (07/12 0800) Resp:  [15-32] 25 (07/12 1100) BP: (110-177)/(53-135) 125/68 (07/12 1000) SpO2:  [83 %-99 %] 98 % (07/12 1100) Weight:  [63.5 kg] 63.5 kg (07/11 2119)  Recent Labs  Lab 06/07/2021 2041  GLUCAP 84   Recent Labs  Lab 06/18/2021 2043 06/18/2021 2050 Jun 21, 2021 0400  NA 134* 135 134*  K 3.6 3.6 3.6  CL 103 104 99  CO2 19*  --  24  GLUCOSE 93 90 105*  BUN 14 14 8   CREATININE 0.78 0.80 0.73  CALCIUM 8.8*  --  8.6*  MG  --   --  1.4*  PHOS  --   --  3.1   Recent Labs  Lab 06/22/2021 2043  AST 52*  ALT 79*  ALKPHOS 43  BILITOT 1.8*  PROT 7.2  ALBUMIN 3.2*   Recent Labs  Lab 06/14/2021 2043 06/19/2021 2050 Jun 21, 2021 0529  WBC 5.9  --  9.7  NEUTROABS 3.2  --   --   HGB 14.7 14.3 13.6  HCT 43.2 42.0 39.3  MCV 92.9  --  91.2  PLT 118*  --  121*   No results for input(s): CKTOTAL, CKMB, CKMBINDEX, TROPONINI in the last 168 hours. Recent Labs    06/16/2021 2043  LABPROT 14.1  INR 1.1   Recent Labs    05/24/2021 2043  COLORURINE YELLOW  LABSPEC 1.010  PHURINE 7.0  GLUCOSEU NEGATIVE  HGBUR NEGATIVE  BILIRUBINUR NEGATIVE  KETONESUR NEGATIVE  PROTEINUR NEGATIVE  NITRITE NEGATIVE  LEUKOCYTESUR NEGATIVE       Component Value Date/Time   CHOL 153 08/14/2020 0959   TRIG 76 08/14/2020 0959   TRIG 150 (H) 09/28/2006 0957   HDL 68 08/14/2020 0959   CHOLHDL 2.3 08/14/2020 0959   VLDL 14.4 08/10/2019 0932   LDLCALC 69 08/14/2020 0959   Lab Results  Component Value Date   HGBA1C 5.5 08/14/2020      Component Value Date/Time   LABOPIA NONE DETECTED 05/28/2021 2043   COCAINSCRNUR NONE DETECTED 06/14/2021  2043   LABBENZ NONE DETECTED 06/11/2021 2043   AMPHETMU NONE DETECTED 06/22/2021 2043   THCU NONE DETECTED 06/13/2021 2043   LABBARB NONE DETECTED 05/23/2021 2043    Recent Labs  Lab 06/15/2021 2043  ETH 70*    I have personally reviewed the radiological images below and agree with the radiology interpretations.  CT HEAD WO CONTRAST  Result Date: 06/21/2021 CLINICAL DATA:  Mental status change EXAM: CT HEAD WITHOUT CONTRAST TECHNIQUE: Contiguous axial images were obtained from the base of the skull through the vertex without intravenous contrast. COMPARISON:  CT 05/25/2021 FINDINGS: Brain: Significant interval increase in size of a recurrent intraparenchymal hematoma centered in the right basal ganglia/external capsule. This measures approximately 5.4 x 4.1 x 6 cm in size compared to 4.7 x 3.1 x 4.3 cm when measured on similar levels to comparison imaging. There appears to be direct extension of hyperdense hemorrhage into the anterior horn of the right lateral ventricle with additional hyperdense hemorrhage layering dependently within both lateral ventricles as well as within the third and fourth ventricles. Increasing subdural blood noted along the falx and tentorium. Increasing  mass effect with effacement of the ventricles and sulci throughout the right cerebral hemisphere. Increasing with right to left midline shift of approximately 14 mm at this time. Some partial transtentorial migration of the right uncus and lateralization of the brainstem. Basal cisterns remain patent. Vascular: Atherosclerotic calcification of the carotid siphons and intradural vertebral arteries. Skull: No calvarial fracture or suspicious osseous lesion. No scalp swelling or hematoma. Sinuses/Orbits: Paranasal sinuses and mastoid air cells are predominantly clear. Included orbital structures are unremarkable. Other: None. IMPRESSION: Increasing size of the large intraparenchymal hemorrhage centered in the right basal ganglia  with intraventricular extension as well as hyperdense hemorrhage along the falx and tentorium. Worsening mass effect with right to left midline shift of nearly 14 mm as well as medialization and transtentorial migration of the the right uncus. Critical Value/emergent results were called by telephone at the time of interpretation on 2021/06/25 at 2:17 am to provider ERIC [facility] , who verbally acknowledged these results. Electronically Signed   By: Lovena Le M.D.   On: June 25, 2021 02:17   CT CERVICAL SPINE WO CONTRAST  Result Date: 05/30/2021 CLINICAL DATA:  Fall EXAM: CT CERVICAL SPINE WITHOUT CONTRAST TECHNIQUE: Multidetector CT imaging of the cervical spine was performed without intravenous contrast. Multiplanar CT image reconstructions were also generated. COMPARISON:  None. FINDINGS: Alignment: No static subluxation. Facets are aligned. Occipital condyles and the lateral masses of C1 and C2 are normally approximated. Skull base and vertebrae: No acute fracture. Soft tissues and spinal canal: No prevertebral fluid or swelling. No visible canal hematoma. Disc levels: No advanced spinal canal or neural foraminal stenosis. Upper chest: No pneumothorax, pulmonary nodule or pleural effusion. Other: Normal visualized paraspinal cervical soft tissues. IMPRESSION: No acute fracture or static subluxation of the cervical spine. Electronically Signed   By: Ulyses Jarred M.D.   On: 05/23/2021 21:05   DG CHEST PORT 1 VIEW  Result Date: June 25, 2021 CLINICAL DATA:  75 year old male with concern for aspiration. EXAM: PORTABLE CHEST 1 VIEW COMPARISON:  Chest radiograph dated 08/25/2016 and CT dated 04/20/2017. FINDINGS: There is cardiomegaly with vascular congestion. Left lung base atelectasis. No focal consolidation, pleural effusion, or pneumothorax. Atherosclerotic calcification of the aortic arch. No acute osseous pathology. IMPRESSION: Cardiomegaly with vascular congestion. No focal consolidation. Electronically  Signed   By: Anner Crete M.D.   On: 06/25/2021 01:30   EEG adult  Result Date: 06-25-2021 Lora Havens, MD     06-25-21  9:51 AM Patient Name: Barry Woods MRN: [id number] Epilepsy Attending: Lora Havens Referring Physician/Provider: Dr Kerney Elbe Date: June 25, 2021 Duration: 22.04 mins Patient history: 75 year old male with right basal ganglia hemorrhage.  EEG to evaluate for seizures. Level of alertness:  lethargic AEDs during EEG study: Keppra Technical aspects: This EEG study was done with scalp electrodes positioned according to the 10-20 International system of electrode placement. Electrical activity was acquired at a sampling rate of 500Hz  and reviewed with a high frequency filter of 70Hz  and a low frequency filter of 1Hz . EEG data were recorded continuously and digitally stored. Description: No clear posterior dominant rhythm was seen. EEG showed continuous generalized and maximal right temporal region mixed frequencies with predominantly 5-9Hz  theta-alpha activity as well as intermittent right temporal 2 to 3 Hz delta slowing. Patient was noted to have multiple episodes of right upper extremity twitching. Concomitant EEG before, during and after the event did not show any EEG changes suggest seizure. Hyperventilation and photic stimulation were not performed.   ABNORMALITY - Continuous  slow, generalized and maximal right temporal region IMPRESSION: This study is suggestive of cortical dysfunction in right temporal region likely secondary to underlying structural abnormality/bleed. Additionally there is moderate diffuse encephalopathy, nonspecific etiology. Multiple episodes of right upper extremity twitching were noted without concomitant EEG change.  However, focal motor seizures may not be seen on scalp EEG.  Therefore clinical correlation is recommended.  No definite seizures or epileptiform discharges were seen throughout the recording. Lora Havens   ECHOCARDIOGRAM  COMPLETE  Result Date: 05/02/2021    ECHOCARDIOGRAM REPORT   Patient Name:   GRIFFEY NICASIO [facility] Date of Exam: 05/02/2021 Medical Rec #:  [id number]          Height:       67.0 in Accession #:    [id number]         Weight:       159.0 lb Date of Birth:  Jun 30, 1946           BSA:          1.834 m Patient Age:    31 years           BP:           132/82 mmHg Patient Gender: M                  HR:           67 bpm. Exam Location:  [place] Procedure: 2D Echo, Cardiac Doppler and Color Doppler Indications:    R06.00 SOB  History:        Patient has prior history of Echocardiogram examinations, most                 recent 11/01/2018. CHF, Stroke, Arrythmias:Atrial Fibrillation,                 Signs/Symptoms:Shortness of Breath and Fatigue; Risk                 Factors:Hypertension and Dyslipidemia.  Sonographer:    Coralyn Helling RDCS Referring Phys: [id number] [place]  1. Left ventricular ejection fraction, by estimation, is 60 to 65%. The left ventricle has normal function. The left ventricle has no regional wall motion abnormalities. Diastolic function is indeterminant due to atrial fibrillation. There is the interventricular septum is flattened in diastole ('D' shaped left ventricle), consistent with right ventricular volume overload.  2. Right ventricular systolic function is mildly reduced. The right ventricular size is moderately-to-severely enlarged. There is moderately elevated pulmonary artery systolic pressure. The estimated right ventricular systolic pressure is 74.0 mmHg.  3. Left atrial size was moderately dilated.  4. Right atrial size was massively dilated.  5. The mitral valve is normal in structure. Mild mitral valve regurgitation.  6. The aortic valve is tricuspid. Aortic valve regurgitation is trivial. No aortic stenosis is present.  7. Aortic dilatation noted. There is borderline dilatation of the ascending aorta, measuring 36 mm.  8. The inferior vena cava is dilated in size  with <50% respiratory variability, suggesting right atrial pressure of 15 mmHg. Comparison(s): Compared to prior TTE in 2019, the RV now appears moderately-to-severely enlarged. PASP now 67mmHg (previously 67mmHg). FINDINGS  Left Ventricle: Left ventricular ejection fraction, by estimation, is 60 to 65%. The left ventricle has normal function. The left ventricle has no regional wall motion abnormalities. The left ventricular internal cavity size was normal in size. There is  no left ventricular hypertrophy. The interventricular septum is flattened in diastole ('D' shaped  left ventricle), consistent with right ventricular volume overload. Diastolic function is indeterminant due to atrial fibrillation. Right Ventricle: The right ventricular size is moderately-to-severely enlarged. Right vetricular wall thickness was not well visualized. Right ventricular systolic function is mildly reduced. There is moderately elevated pulmonary artery systolic pressure. The tricuspid regurgitant velocity is 3.22 m/s, and with an assumed right atrial pressure of 15 mmHg, the estimated right ventricular systolic pressure is 92.3 mmHg. Left Atrium: Left atrial size was moderately dilated. Right Atrium: Right atrial size was massively dilated. Pericardium: There is no evidence of pericardial effusion. Mitral Valve: The mitral valve is normal in structure. There is mild thickening of the mitral valve leaflet(s). Mild mitral annular calcification. Mild mitral valve regurgitation. Tricuspid Valve: The tricuspid valve is normal in structure. Tricuspid valve regurgitation is trivial. Aortic Valve: The aortic valve is tricuspid. Aortic valve regurgitation is trivial. No aortic stenosis is present. Pulmonic Valve: The pulmonic valve was normal in structure. Pulmonic valve regurgitation is trivial. Aorta: Aortic dilatation noted. There is borderline dilatation of the ascending aorta, measuring 36 mm. Venous: The inferior vena cava is dilated in  size with less than 50% respiratory variability, suggesting right atrial pressure of 15 mmHg. IAS/Shunts: No atrial level shunt detected by color flow Doppler.  LEFT VENTRICLE PLAX 2D LVIDd:         4.10 cm  Diastology LVIDs:         2.30 cm  LV e' medial:    5.82 cm/s LV PW:         1.30 cm  LV E/e' medial:  17.5 LV IVS:        1.00 cm  LV e' lateral:   8.81 cm/s LVOT diam:     2.00 cm  LV E/e' lateral: 11.5 LV SV:         39 LV SV Index:   21 LVOT Area:     3.14 cm  RIGHT VENTRICLE            IVC RVSP:           44.5 mmHg  IVC diam: 2.40 cm LEFT ATRIUM             Index       RIGHT ATRIUM           Index LA diam:        5.10 cm 2.78 cm/m  RA Pressure: 3.00 mmHg LA Vol (A2C):   77.1 ml 42.03 ml/m RA Area:     32.90 cm LA Vol (A4C):   76.7 ml 41.81 ml/m RA Volume:   121.00 ml 65.96 ml/m LA Biplane Vol: 77.9 ml 42.47 ml/m  AORTIC VALVE LVOT Vmax:   67.82 cm/s LVOT Vmean:  44.840 cm/s LVOT VTI:    0.125 m  AORTA Ao Root diam: 3.50 cm Ao Asc diam:  3.60 cm MV E velocity: 101.70 cm/s  TRICUSPID VALVE                             TR Peak grad:   41.5 mmHg                             TR Vmax:        322.00 cm/s  Estimated RAP:  3.00 mmHg                             RVSP:           44.5 mmHg                              SHUNTS                             Systemic VTI:  0.12 m                             Systemic Diam: 2.00 cm Gwyndolyn Kaufman MD Electronically signed by Gwyndolyn Kaufman MD Signature Date/Time: 05/02/2021/5:47:19 PM    Final    CT HEAD CODE STROKE WO CONTRAST  Result Date: 05/31/2021 CLINICAL DATA:  Code stroke.  Left-sided weakness. EXAM: CT HEAD WITHOUT CONTRAST TECHNIQUE: Contiguous axial images were obtained from the base of the skull through the vertex without intravenous contrast. COMPARISON:  11/28/2018 FINDINGS: Brain: There is a recurrent acute intraparenchymal hematoma with its epicenter in the right basal ganglia or external capsule, the same site as an acute  hemorrhage in January of 2020. Today this measures 4.7 x 4.3 x 3.1 cm (volume = 33 cm^3). There is mild surrounding edema. Mass-effect upon the right lateral ventricle which is flattened. Right-to-left midline shift of 1 mm. Small amount of intraventricular blood layering in the occipital horn of the right lateral ventricle. Chronic small-vessel ischemic changes seen elsewhere within the hemispheric white matter. Old infarction in the left external capsule region. Vascular: There is atherosclerotic calcification of the major vessels at the base of the brain. Skull: Negative Sinuses/Orbits: Clear/normal Other: None IMPRESSION: 1. 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 4.7 x 4.3 x 3.1 cm measurements today. Mild surrounding edema and mass effect. Small amount of intraventricular blood within the occipital horn of the right lateral ventricle. 2. These results were communicated to Dr. Cheral Marker at 8:52 pm on 06/05/2021 by text page via the [facility] messaging system. Electronically Signed   By: Nelson Chimes M.D.   On: 06/13/2021 20:54     PHYSICAL EXAM  Temp:  [97.7 F (36.5 C)-102.2 F (39 C)] 101.8 F (38.8 C) (07/12 0800) Pulse Rate:  [57-109] 84 (07/12 1100) Resp:  [15-32] 25 (07/12 1100) BP: (110-177)/(53-135) 125/68 (07/12 1000) SpO2:  [83 %-99 %] 98 % (07/12 1100) Weight:  [63.5 kg] 63.5 kg (07/11 2119)  General - Well nourished, well developed, eye closed   Ophthalmologic - fundi not visualized due to noncooperation.  Cardiovascular - irregularly irregular heart rate and rhythm.  Neuro - limited exam due to comfort care measures. Pt eyes closed, not open on voice, facial symmetry difficulty to test due to position. No spontaneous movement of all extremities. Airway so far protected.    ASSESSMENT/PLAN Barry Woods is a 75 y.o. male with history of cerebral cavernoma, hypertension,  hyperlipidemia, A. fib not on AC, ICH admitted for left-sided weakness and found down. No tPA given due to [place].    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 CAA CT head showed right BG ICH Repeat CT head showed significantly increased size  of right BG ICH with IVH 2D Echo 60 to 65% in 04/2021 Neurosurgery consulted, not a candidate for hematoma evaluation or EVD.  Sign off after discussed with patient daughter. No antithrombotic prior to admission, now on No antithrombotic.  Disposition: Poor prognosis, CCM and neurosurgery discussed with patient daughter, currently CODE STATUS DNR, and family requested comfort caremeasures.  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 11/2019 right BG external capsule ICH, Eliquis discontinued  A. Fib EKG showed A. fib on admission Was on Eliquis prior to 11/2019 Eliquis discontinued after ICH in 11/2019 Rate controlled  Hypertension Stable on low-dose Cleviprex Will DC given on comfort care measures  Hyperlipidemia Home meds: Zocor 20  Other Stroke Risk Factors Advanced age  Other Active Problems Pulmonary hypertension on ReVatio Rheumatoid arthritis  Hospital day # 1   Rosalin Hawking, MD PhD Stroke Neurology Jun 18, 2021 11:36 AM    To contact Stroke Continuity provider, please refer to [email or web address] After hours, contact General Neurology

## 2021-06-23 NOTE — Progress Notes (Signed)
IVT access consult notes:  For CVL placement ; PIV access not needed. Clinical team @ bedside at this time.

## 2021-06-23 NOTE — Consult Note (Signed)
NAME:  Barry Woods, MRN:  [id number], DOB:  1946-05-06, LOS: 1 ADMISSION DATE:  06/13/2021, CONSULTATION DATE: 2021/06/21 REFERRING MD: Dr. Cheral Marker, CHIEF COMPLAINT:  Right Basal Ganglia Hemorrhage  History of Present Illness:  75 year old male with PMH of right basal ganglia hemorrhage (2020), 7/11 presents to ED after being found on the floor at home with left hemiplegia. On arrival to ED patient is nauseous, however alert and oriented, CT head with recurrent IPH in the right basal ganglia/external capsule, noted mild surrounding edema and mass effect. PLT 118. Neurosurgery consulted. Plans for BP goal <140.   Admitted to Neuro ICU under neurology service. Shortly after arrival to unit patient with progressive nausea. Repeat Head CT with increasing size of IPH with worsening mass effect with right to left midline shift of 14 mm. Patient with concern for aspiration and worsening mental status. Critical Care Consulted.   Pertinent  Medical History  Cerebral Cavernoma, HLD, HTN, A.Fib, Pulmonary HTN, Right Basal Ganglia hemorrhage 2020  Significant Hospital Events: Including procedures, antibiotic start and stop dates in addition to other pertinent events   7/11 Presents to ED. 7/12 worsening midline shift on Head CT   Interim History / Subjective:  As above.   Objective   Blood pressure (!) 158/82, pulse 90, temperature 97.7 F (36.5 C), temperature source Oral, resp. rate (!) 26, height 5\' 8"  (1.727 m), weight 63.5 kg, SpO2 95 %.        Intake/Output Summary (Last 24 hours) at 21-Jun-2021 0257 Last data filed at 2021-06-21 0200 Gross per 24 hour  Intake 67.95 ml  Output --  Net 67.95 ml   Filed Weights   05/29/2021 2119  Weight: 63.5 kg    Examination: General: Critically ill appearing adult male  HENT: Dry MM  Lungs: Rhonchi, mild use of accessory muscles  Cardiovascular: Irregular, HR 77 Abdomen: distended, soft, active bowel sounds  Extremities: -edema Neuro:  lethargic, opens eyes with physical stimulation, spontaneous to RUE/RLE, flaccid to LUE, withdrawals to LLE GU: foley in place   Resolved Hospital Problem list     Assessment & Plan:   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  Plan -Family has made decision for DNR.  -Titrate Supplemental Oxygen for saturation goal >92  HTN Plan -Titrate Cleviprex for Systolic Goal <675   -Continue Norvasc, Atenolol  Pulmonary HTN Plan -Continue home sildenafil    Best Practice (right click and "Reselect all SmartList Selections" daily)   Diet/type: NPO DVT prophylaxis: not indicated GI prophylaxis: PPI Lines: Central line Foley:  Yes, and it is still needed Code Status:  full code Last date of multidisciplinary goals of care discussion 7/12.   Labs   CBC: Recent Labs  Lab 06/01/2021 2043 06/09/2021 2050  WBC 5.9  --   NEUTROABS 3.2  --   HGB 14.7 14.3  HCT 43.2 42.0  MCV 92.9  --   PLT 118*  --     Basic Metabolic Panel: Recent Labs  Lab 06/10/2021 2043  2050  NA 134* 135  K 3.6 3.6  CL 103 104  CO2 19*  --   GLUCOSE 93 90  BUN 14 14  CREATININE 0.78 0.80  CALCIUM 8.8*  --  GFR: Estimated Creatinine Clearance: 75 mL/min (by C-G formula based on SCr of 0.8 mg/dL). Recent Labs  Lab 06/10/2021 2043  WBC 5.9    Liver Function Tests: Recent Labs  Lab 06/04/2021 2043  AST 52*  ALT 79*  ALKPHOS 43  BILITOT 1.8*  PROT 7.2  ALBUMIN 3.2*   No results for input(s): LIPASE, AMYLASE in the last 168 hours. No results for input(s): AMMONIA in the last 168 hours.  ABG    Component Value Date/Time   TCO2 20 (L) 06/21/2021 2050     Coagulation Profile: Recent Labs  Lab  05/27/2021 2043  INR 1.1    Cardiac Enzymes: No results for input(s): CKTOTAL, CKMB, CKMBINDEX, TROPONINI in the last 168 hours.  HbA1C: Hgb A1c MFr Bld  Date/Time Value Ref Range Status  08/14/2020 09:59 AM 5.5 <5.7 % of total Hgb Final    Comment:    For the purpose of screening for the presence of diabetes: . <5.7%       Consistent with the absence of diabetes 5.7-6.4%    Consistent with increased risk for diabetes             (prediabetes) > or =6.5%  Consistent with diabetes . This assay result is consistent with a decreased risk of diabetes. . Currently, no consensus exists regarding use of hemoglobin A1c for diagnosis of diabetes in children. . According to American Diabetes Association (ADA) guidelines, hemoglobin A1c <7.0% represents optimal control in non-pregnant diabetic patients. Different metrics may apply to specific patient populations.  Standards of Medical Care in Diabetes(ADA). .     CBG: Recent Labs  Lab 06/09/2021 2041  GLUCAP 43    Review of Systems:   Unable to review given encephalopathy   Past Medical History:  He,  has a past medical history of Arthritis, Cerebral cavernoma, Hyperlipidemia, Hypertension, Persistent atrial fibrillation ([place]), Pulmonary arterial hypertension ([place]) (04/2021), Rheumatoid arthritis ([place]), and Stroke ([place]).   Surgical History:   Past Surgical History:  Procedure Laterality Date   COLONOSCOPY     LIPOMA EXCISION     PALATE / UVULA BIOPSY / EXCISION     TONSILLECTOMY     age 4     Social History:   reports that he has never smoked. He has never used smokeless tobacco. He reports previous alcohol use of about 14.0 standard drinks of alcohol per week. He reports that he does not use drugs.   Family History:  His family history includes CAD in his father; Colon cancer (age of onset: 54) in his mother; Hepatitis C in his father.   Allergies Allergies  Allergen Reactions   Atorvastatin Hives   Cetirizine  Hcl Hives     Home Medications  Prior to Admission medications   Medication Sig Start Date End Date Taking? Authorizing Provider  Adalimumab 40 MG/0.8ML PSKT Inject 40 mg into the skin See admin instructions. Every other week   Yes [provider]  amLODipine (NORVASC) 5 MG tablet TAKE 1 TABLET BY MOUTH ONCE DAILY IN THE EVENING Patient taking differently: Take 2.5 mg by mouth daily. TAKE 0.5 TABLET BY MOUTH ONCE DAILY IN THE EVENING 12/18/20  Yes Burnell Blanks, MD  atenolol (TENORMIN) 50 MG tablet TAKE 1 & 1/2 (ONE & ONE-HALF) TABLETS BY MOUTH ONCE DAILY Patient taking differently: Take 75 mg by mouth daily. 09/30/20  Yes Burnell Blanks, MD  predniSONE (DELTASONE) 10 MG tablet Take 10 mg by mouth daily as needed (RA). 04/08/21  Yes [provider]  sildenafil (REVATIO) 20 MG tablet Take 1 tablet (20 mg total) by mouth 3 (three) times daily. 05/05/21  Yes Cleaver, Jossie Ng, NP  simvastatin (ZOCOR) 20 MG tablet Take 1 tablet (20 mg total) by mouth at bedtime. 08/09/20  Yes Burnell Blanks, MD     Critical care time: 16 minutes     Hayden Pedro, AGACNP-BC [facility]  PCCM Pgr: [phone]

## 2021-06-23 NOTE — Procedures (Signed)
Patient Name: Barry Woods  MRN: [id number]  Epilepsy Attending: Lora Havens  Referring Physician/Provider: Dr Kerney Elbe Date: 2021/06/29 Duration: 22.04 mins  Patient history: 75 year old male with right basal ganglia hemorrhage.  EEG to evaluate for seizures.  Level of alertness:  lethargic   AEDs during EEG study: Keppra  Technical aspects: This EEG study was done with scalp electrodes positioned according to the 10-20 International system of electrode placement. Electrical activity was acquired at a sampling rate of 500Hz  and reviewed with a high frequency filter of 70Hz  and a low frequency filter of 1Hz . EEG data were recorded continuously and digitally stored.   Description: No clear posterior dominant rhythm was seen. EEG showed continuous generalized and maximal right temporal region mixed frequencies with predominantly 5-9Hz  theta-alpha activity as well as intermittent right temporal 2 to 3 Hz delta slowing. Patient was noted to have multiple episodes of right upper extremity twitching. Concomitant EEG before, during and after the event did not show any EEG changes suggest seizure. Hyperventilation and photic stimulation were not performed.     ABNORMALITY - Continuous slow, generalized and maximal right temporal region  IMPRESSION: This study is suggestive of cortical dysfunction in right temporal region likely secondary to underlying structural abnormality/bleed. Additionally there is moderate diffuse encephalopathy, nonspecific etiology. Multiple episodes of right upper extremity twitching were noted without concomitant EEG change.  However, focal motor seizures may not be seen on scalp EEG.  Therefore clinical correlation is recommended.  No definite seizures or epileptiform discharges were seen throughout the recording.   Branden Vine Barbra Sarks

## 2021-06-23 NOTE — Progress Notes (Signed)
SLP Cancellation Note  Patient Details Name: Barry Woods MRN: [id number] DOB: 23-Oct-1946   Cancelled treatment:        Per RN pt is not responsive- will check tomorrow.    Houston Siren 2021-07-03, 8:52 AM

## 2021-06-23 NOTE — Progress Notes (Signed)
NAME:  VICTORY STROLLO, MRN:  [id number], DOB:  1946-03-07, LOS: 1 ADMISSION DATE:  06/09/2021, CONSULTATION DATE: 2021-06-29 REFERRING MD: Dr. Cheral Marker, CHIEF COMPLAINT:  Right Basal Ganglia Hemorrhage  History of Present Illness:  75 year old male with PMH of right basal ganglia hemorrhage (2020), 7/11 presents to ED after being found on the floor at home with left hemiplegia. On arrival to ED patient is nauseous, however alert and oriented, CT head with recurrent IPH in the right basal ganglia/external capsule, noted mild surrounding edema and mass effect. PLT 118. Neurosurgery consulted. Plans for BP goal <140.   Admitted to Neuro ICU under neurology service. Shortly after arrival to unit patient with progressive nausea. Repeat Head CT with increasing size of IPH with worsening mass effect with right to left midline shift of 14 mm. Patient with concern for aspiration and worsening mental status. Critical Care Consulted.   Pertinent  Medical History  Cerebral Cavernoma, HLD, HTN, A.Fib, Pulmonary HTN, Right Basal Ganglia hemorrhage 2020  Significant Hospital Events: Including procedures, antibiotic start and stop dates in addition to other pertinent events   7/11 Presents to ED. 7/12 worsening midline shift on Head CT  7/12 Transitioned to comfort care  Interim History / Subjective:  As above, pt with neurologic decline overnight and increased size of IPH with 65mm midline shift on Head CT. Had goals of care discussion with daughter at bedside. Pt's other daughter and sister participating by phone. Decision made to transition to comfort care.   Objective   Blood pressure 125/68, pulse 84, temperature (!) 101.8 F (38.8 C), temperature source Axillary, resp. rate (!) 25, height 5\' 8"  (1.727 m), weight 63.5 kg, SpO2 98 %.        Intake/Output Summary (Last 24 hours) at 2021/06/29 1152 Last data filed at Jun 29, 2021 1100 Gross per 24 hour  Intake 855.42 ml  Output 1000 ml  Net -144.58 ml     Filed Weights   05/25/2021 2119  Weight: 63.5 kg    Examination: General: Critically ill appearing adult male, sweating HENT: Dry MM  Lungs: CTAB, mild use of accessory muscles  Cardiovascular: Irregular rhythm, no m/r/g Abdomen: normoactive bowel sounds, soft Extremities: no edema Neuro: Does not open his eyes to pain. Nonverbal. LUE extends to pain. BLE flex to pain. Abnormal oculocephalic reflex. Pupils small and non-reactive.   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   Labs   CBC: Recent Labs  Lab 05/26/2021 2043 05/28/2021 2050 2021-06-29 0529  WBC 5.9  --  9.7  NEUTROABS 3.2  --   --   HGB 14.7 14.3 13.6  HCT 43.2 42.0 39.3  MCV 92.9  --  91.2  PLT 118*  --  121*     Basic Metabolic Panel: Recent Labs  Lab 06/18/2021 2043 05/31/2021 2050 2021-06-29 0400  NA 134* 135 134*  K  3.6 3.6 3.6  CL 103 104 99  CO2 19*  --  24  GLUCOSE 93 90 105*  BUN 14 14 8   CREATININE 0.78 0.80 0.73  CALCIUM 8.8*  --  8.6*  MG  --   --  1.4*  PHOS  --   --  3.1    GFR: Estimated Creatinine Clearance: 75 mL/min (by C-G formula based on SCr of 0.73 mg/dL). Recent Labs  Lab 06/15/2021 2043 06/05/21 0529  WBC 5.9 9.7     Liver Function Tests: Recent Labs  Lab 06/20/2021 2043  AST 52*  ALT 79*  ALKPHOS 43  BILITOT 1.8*  PROT  7.2  ALBUMIN 3.2*    No results for input(s): LIPASE, AMYLASE in the last 168 hours. No results for input(s): AMMONIA in the last 168 hours.  ABG    Component Value Date/Time   TCO2 20 (L) 06/19/2021 2050      Coagulation Profile: Recent Labs  Lab 06/14/2021 2043  INR 1.1     Cardiac Enzymes: No results for input(s): CKTOTAL, CKMB, CKMBINDEX, TROPONINI in the last 168 hours.  HbA1C: Hgb A1c MFr Bld  Date/Time Value Ref Range Status  08/14/2020 09:59 AM 5.5 <5.7 % of total Hgb Final    Comment:    For the purpose of screening for the presence of diabetes: . <5.7%       Consistent with the absence of diabetes 5.7-6.4%    Consistent with increased risk for diabetes             (prediabetes) > or =6.5%  Consistent with diabetes . This assay result is consistent with a decreased risk of diabetes. . Currently, no consensus exists regarding use of hemoglobin A1c for diagnosis of diabetes in children. . According to American Diabetes Association (ADA) guidelines, hemoglobin A1c <7.0% represents optimal control in non-pregnant diabetic patients. Different metrics may apply to specific patient populations.  Standards of Medical Care in Diabetes(ADA). .     CBG: Recent Labs  Lab 06/16/2021 2041  GLUCAP 68     Review of Systems:   Unable to review given encephalopathy   Past Medical History:  He,  has a past medical history of Arthritis, Cerebral cavernoma, Hyperlipidemia, Hypertension, Persistent atrial fibrillation ([place]), Pulmonary arterial hypertension ([place]) (04/2021), Rheumatoid arthritis ([place]), and Stroke ([place]).   Surgical History:   Past Surgical History:  Procedure Laterality Date   COLONOSCOPY     LIPOMA EXCISION     PALATE / UVULA BIOPSY / EXCISION     TONSILLECTOMY     age 14     Social History:   reports that he has never smoked. He has never used smokeless tobacco. He reports previous alcohol use of about 14.0 standard drinks of alcohol per  week. He reports that he does not use drugs.   Family History:  His family history includes CAD in his father; Colon cancer (age of onset: 79) in his mother; Hepatitis C in his father.   Allergies Allergies  Allergen Reactions   Atorvastatin Hives   Cetirizine Hcl Hives     Home Medications  Prior to Admission medications   Medication Sig Start Date End Date Taking? Authorizing Provider  Adalimumab 40 MG/0.8ML PSKT Inject 40 mg into the skin See admin instructions. Every other week   Yes [provider]  amLODipine (NORVASC) 5 MG tablet TAKE 1 TABLET BY MOUTH ONCE DAILY IN THE EVENING Patient taking differently: Take 2.5 mg by mouth daily. TAKE 0.5  TABLET BY MOUTH ONCE DAILY IN THE EVENING 12/18/20  Yes Burnell Blanks, MD  atenolol (TENORMIN) 50 MG tablet TAKE 1 & 1/2 (ONE & ONE-HALF) TABLETS BY MOUTH ONCE DAILY Patient taking differently: Take 75 mg by mouth daily. 09/30/20  Yes Burnell Blanks, MD  predniSONE (DELTASONE) 10 MG tablet Take 10 mg by mouth daily as needed (RA). 04/08/21  Yes [provider]  sildenafil (REVATIO) 20 MG tablet Take 1 tablet (20 mg total) by mouth 3 (three) times daily. 05/05/21  Yes Cleaver, Jossie Ng, NP  simvastatin (ZOCOR) 20 MG tablet Take 1 tablet (20 mg total) by mouth at bedtime. 08/09/20  Yes Burnell Blanks, MD     Critical care time:     Martyn Ehrich, MS4

## 2021-06-23 NOTE — Progress Notes (Signed)
I was contacted by Dr. Cheral Marker regarding this patient's worsening scan and neurologic status.    I reviewed the patient's repeat head CT.  It demonstrates significant enlargement of his right basal ganglia hemorrhage with now significant midline shift / brain stem compression.  He has an intraventricular hemorrhage.  He does not have any significant hydrocephalus.    I have discussed this with the patient's daughter, Levada Dy, who was a former ICU nurse.  We discussed the fact that this  hemorrhage itself is inoperable.  We discussed the treatment option of ventriculostomy.  However I am not particularly optimistic that this will help him because the main problem is the hemorrhage and brainstem compression,  I.e. I do not think it would help much.  Presently she does not want to proceed with ventriculostomy but will discuss this with her siblings.  I am available if the family wants to proceed with ventriculostomy.

## 2021-06-23 NOTE — Progress Notes (Addendum)
TOC CSW reports that pt is currently unresponsive.  For Cage-[employer] (ETOH 70) purposes CSW will follow pt to when he is capable of responding.  Layla Kesling Tarpley-Carter, MSW, LCSW-A Pronouns:  She/Her/Hers                          [place] Clinical Social WorkerTransitions of Care Cell:  [phone] [email or web address]

## 2021-06-23 NOTE — Progress Notes (Signed)
Subjective: The patient is somnolent but arousable.  He is in no apparent distress.  His daughters are at the bedside.  Objective: Vital signs in last 24 hours: Temp:  [97.7 F (36.5 C)-102.2 F (39 C)] 102.2 F (39 C) (07/12 0515) Pulse Rate:  [57-106] 95 (07/12 0715) Resp:  [15-31] 28 (07/12 0715) BP: (110-177)/(53-135) 121/61 (07/12 0715) SpO2:  [83 %-99 %] 95 % (07/12 0715) Weight:  [63.5 kg] 63.5 kg (07/11 2119) Estimated body mass index is 21.29 kg/m as calculated from the following:   Height as of this encounter: 5\' 8"  (1.727 m).   Weight as of this encounter: 63.5 kg.   Intake/Output from previous day: 07/11 0701 - 07/12 0700 In: 527 [I.V.:220.6; IV Piggyback:306.3] Out: 1000 [Urine:1000] Intake/Output this shift: No intake/output data recorded.  Physical exam the patient's pupils are small bilaterally.  He extends on the left and abnormal reflexes on the right.  Lab Results: Recent Labs    05/25/2021 2043 06/20/2021 2050 June 21, 2021 0529  WBC 5.9  --  9.7  HGB 14.7 14.3 13.6  HCT 43.2 42.0 39.3  PLT 118*  --  121*   BMET Recent Labs    06/12/2021 2043 06/06/2021 2050 2021/06/21 0400  NA 134* 135 134*  K 3.6 3.6 3.6  CL 103 104 99  CO2 19*  --  24  GLUCOSE 93 90 105*  BUN 14 14 8   CREATININE 0.78 0.80 0.73  CALCIUM 8.8*  --  8.6*    Studies/Results: CT HEAD WO CONTRAST  Result Date: 06/21/21 CLINICAL DATA:  Mental status change EXAM: CT HEAD WITHOUT CONTRAST TECHNIQUE: Contiguous axial images were obtained from the base of the skull through the vertex without intravenous contrast. COMPARISON:  CT 06/06/2021 FINDINGS: Brain: Significant interval increase in size of a recurrent intraparenchymal hematoma centered in the right basal ganglia/external capsule. This measures approximately 5.4 x 4.1 x 6 cm in size compared to 4.7 x 3.1 x 4.3 cm when measured on similar levels to comparison imaging. There appears to be direct extension of hyperdense hemorrhage into the  anterior horn of the right lateral ventricle with additional hyperdense hemorrhage layering dependently within both lateral ventricles as well as within the third and fourth ventricles. Increasing subdural blood noted along the falx and tentorium. Increasing mass effect with effacement of the ventricles and sulci throughout the right cerebral hemisphere. Increasing with right to left midline shift of approximately 14 mm at this time. Some partial transtentorial migration of the right uncus and lateralization of the brainstem. Basal cisterns remain patent. Vascular: Atherosclerotic calcification of the carotid siphons and intradural vertebral arteries. Skull: No calvarial fracture or suspicious osseous lesion. No scalp swelling or hematoma. Sinuses/Orbits: Paranasal sinuses and mastoid air cells are predominantly clear. Included orbital structures are unremarkable. Other: None. IMPRESSION: Increasing size of the large intraparenchymal hemorrhage centered in the right basal ganglia with intraventricular extension as well as hyperdense hemorrhage along the falx and tentorium. Worsening mass effect with right to left midline shift of nearly 14 mm as well as medialization and transtentorial migration of the the right uncus. Critical Value/emergent results were called by telephone at the time of interpretation on 2021/06/21 at 2:17 am to provider ERIC [facility] , who verbally acknowledged these results. Electronically Signed   By: Lovena Le M.D.   On: 06/21/2021 02:17   CT CERVICAL SPINE WO CONTRAST  Result Date: 06/21/2021 CLINICAL DATA:  Fall EXAM: CT CERVICAL SPINE WITHOUT CONTRAST TECHNIQUE: Multidetector CT imaging of the cervical  spine was performed without intravenous contrast. Multiplanar CT image reconstructions were also generated. COMPARISON:  None. FINDINGS: Alignment: No static subluxation. Facets are aligned. Occipital condyles and the lateral masses of C1 and C2 are normally approximated. Skull base and  vertebrae: No acute fracture. Soft tissues and spinal canal: No prevertebral fluid or swelling. No visible canal hematoma. Disc levels: No advanced spinal canal or neural foraminal stenosis. Upper chest: No pneumothorax, pulmonary nodule or pleural effusion. Other: Normal visualized paraspinal cervical soft tissues. IMPRESSION: No acute fracture or static subluxation of the cervical spine. Electronically Signed   By: Ulyses Jarred M.D.   On: 06/01/2021 21:05   DG CHEST PORT 1 VIEW  Result Date: 2021/06/27 CLINICAL DATA:  75 year old male with concern for aspiration. EXAM: PORTABLE CHEST 1 VIEW COMPARISON:  Chest radiograph dated 08/25/2016 and CT dated 04/20/2017. FINDINGS: There is cardiomegaly with vascular congestion. Left lung base atelectasis. No focal consolidation, pleural effusion, or pneumothorax. Atherosclerotic calcification of the aortic arch. No acute osseous pathology. IMPRESSION: Cardiomegaly with vascular congestion. No focal consolidation. Electronically Signed   By: Anner Crete M.D.   On: 2021/06/27 01:30   CT HEAD CODE STROKE WO CONTRAST  Result Date: 06/05/2021 CLINICAL DATA:  Code stroke.  Left-sided weakness. EXAM: CT HEAD WITHOUT CONTRAST TECHNIQUE: Contiguous axial images were obtained from the base of the skull through the vertex without intravenous contrast. COMPARISON:  11/28/2018 FINDINGS: Brain: There is a recurrent acute intraparenchymal hematoma with its epicenter in the right basal ganglia or external capsule, the same site as an acute hemorrhage in January of 2020. Today this measures 4.7 x 4.3 x 3.1 cm (volume = 33 cm^3). There is mild surrounding edema. Mass-effect upon the right lateral ventricle which is flattened. Right-to-left midline shift of 1 mm. Small amount of intraventricular blood layering in the occipital horn of the right lateral ventricle. Chronic small-vessel ischemic changes seen elsewhere within the hemispheric white matter. Old infarction in the left  external capsule region. Vascular: There is atherosclerotic calcification of the major vessels at the base of the brain. Skull: Negative Sinuses/Orbits: Clear/normal Other: None IMPRESSION: 1. 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 4.7 x 4.3 x 3.1 cm measurements today. Mild surrounding edema and mass effect. Small amount of intraventricular blood within the occipital horn of the right lateral ventricle. 2. These results were communicated to Dr. Cheral Marker at 8:52 pm on 06/12/2021 by text page via the [facility] messaging system. Electronically Signed   By: Nelson Chimes M.D.   On: 06/06/2021 20:54    Assessment/Plan: Intracerebral hemorrhage: I have discussed the situation again with the patient's daughters.  They have appropriately made him a DNR.  I do not think he would benefit from surgery or a ventriculostomy.  I will sign off.  Please recall if I can be of further assistance.  LOS: 1 day     Ophelia Charter 06/27/21, 7:55 AM

## 2021-06-23 NOTE — Progress Notes (Signed)
Patient arrived to 6N from 4N; handoff completed with RN from 4N. Oxygen switched over from tank to wall; patient's respirations are regular at the moment. Condom cath on, no output currently in bag but per report patient has been producing urine. Patient unresponsive to voice, minimal reflex response to pain. Auditory breaths and chest rise and fall verified with handoff RN.

## 2021-06-23 DEATH — deceased

## 2021-07-15 ENCOUNTER — Institutional Professional Consult (permissible substitution): Payer: Medicare HMO | Admitting: Pulmonary Disease

## 2021-08-15 ENCOUNTER — Encounter: Payer: Medicare HMO | Admitting: Adult Health

## 2021-10-27 ENCOUNTER — Ambulatory Visit: Payer: Medicare HMO | Admitting: Cardiovascular Disease
